# Patient Record
Sex: Female | Born: 1965 | Race: White | Hispanic: No | Marital: Single | Smoking: Former smoker
Health system: Southern US, Community
[De-identification: ages and names within clinical notes are randomized; demographics above are authoritative.]

## PROBLEM LIST (undated history)

## (undated) DIAGNOSIS — K589 Irritable bowel syndrome without diarrhea: Secondary | ICD-10-CM

## (undated) DIAGNOSIS — J40 Bronchitis, not specified as acute or chronic: Secondary | ICD-10-CM

## (undated) DIAGNOSIS — F32A Depression, unspecified: Secondary | ICD-10-CM

## (undated) DIAGNOSIS — Z9884 Bariatric surgery status: Secondary | ICD-10-CM

## (undated) DIAGNOSIS — K219 Gastro-esophageal reflux disease without esophagitis: Secondary | ICD-10-CM

## (undated) DIAGNOSIS — J449 Chronic obstructive pulmonary disease, unspecified: Secondary | ICD-10-CM

## (undated) DIAGNOSIS — F329 Major depressive disorder, single episode, unspecified: Secondary | ICD-10-CM

## (undated) DIAGNOSIS — C3490 Malignant neoplasm of unspecified part of unspecified bronchus or lung: Secondary | ICD-10-CM

## (undated) DIAGNOSIS — F419 Anxiety disorder, unspecified: Secondary | ICD-10-CM

## (undated) HISTORY — DX: Major depressive disorder, single episode, unspecified: F32.9

## (undated) HISTORY — DX: Irritable bowel syndrome, unspecified: K58.9

## (undated) HISTORY — PX: CHOLECYSTECTOMY: SHX55

## (undated) HISTORY — DX: Anxiety disorder, unspecified: F41.9

## (undated) HISTORY — PX: BACK SURGERY: SHX140

## (undated) HISTORY — DX: Bariatric surgery status: Z98.84

## (undated) HISTORY — DX: Chronic obstructive pulmonary disease, unspecified: J44.9

## (undated) HISTORY — PX: COLON SURGERY: SHX602

## (undated) HISTORY — DX: Depression, unspecified: F32.A

## (undated) HISTORY — DX: Gastro-esophageal reflux disease without esophagitis: K21.9

## (undated) HISTORY — DX: Bronchitis, not specified as acute or chronic: J40

---

## 1998-03-22 HISTORY — PX: OTHER SURGICAL HISTORY: SHX169

## 2013-09-06 ENCOUNTER — Encounter (INDEPENDENT_AMBULATORY_CARE_PROVIDER_SITE_OTHER): Payer: Self-pay | Admitting: *Deleted

## 2013-10-25 ENCOUNTER — Ambulatory Visit (INDEPENDENT_AMBULATORY_CARE_PROVIDER_SITE_OTHER): Payer: BC Managed Care – PPO | Admitting: Internal Medicine

## 2013-10-25 ENCOUNTER — Encounter (INDEPENDENT_AMBULATORY_CARE_PROVIDER_SITE_OTHER): Payer: Self-pay | Admitting: Internal Medicine

## 2013-10-25 VITALS — BP 92/56 | HR 68 | Temp 98.2°F | Ht 69.0 in | Wt 176.2 lb

## 2013-10-25 DIAGNOSIS — K589 Irritable bowel syndrome without diarrhea: Secondary | ICD-10-CM | POA: Insufficient documentation

## 2013-10-25 DIAGNOSIS — R1013 Epigastric pain: Secondary | ICD-10-CM

## 2013-10-25 DIAGNOSIS — R197 Diarrhea, unspecified: Secondary | ICD-10-CM | POA: Insufficient documentation

## 2013-10-25 DIAGNOSIS — G8929 Other chronic pain: Secondary | ICD-10-CM

## 2013-10-25 LAB — CBC WITH DIFFERENTIAL/PLATELET
BASOS ABS: 0.1 10*3/uL (ref 0.0–0.1)
Basophils Relative: 1 % (ref 0–1)
EOS PCT: 4 % (ref 0–5)
Eosinophils Absolute: 0.4 10*3/uL (ref 0.0–0.7)
HEMATOCRIT: 36.3 % (ref 36.0–46.0)
Hemoglobin: 12.1 g/dL (ref 12.0–15.0)
LYMPHS ABS: 3.6 10*3/uL (ref 0.7–4.0)
Lymphocytes Relative: 41 % (ref 12–46)
MCH: 28.4 pg (ref 26.0–34.0)
MCHC: 33.3 g/dL (ref 30.0–36.0)
MCV: 85.2 fL (ref 78.0–100.0)
Monocytes Absolute: 0.7 10*3/uL (ref 0.1–1.0)
Monocytes Relative: 8 % (ref 3–12)
NEUTROS ABS: 4 10*3/uL (ref 1.7–7.7)
Neutrophils Relative %: 46 % (ref 43–77)
Platelets: 361 10*3/uL (ref 150–400)
RBC: 4.26 MIL/uL (ref 3.87–5.11)
RDW: 13.7 % (ref 11.5–15.5)
WBC: 8.8 10*3/uL (ref 4.0–10.5)

## 2013-10-25 MED ORDER — DICYCLOMINE HCL 10 MG PO CAPS: 10.0000 mg | ORAL_CAPSULE | Freq: Two times a day (BID) | ORAL | Status: DC

## 2013-10-25 NOTE — Patient Instructions (Signed)
EGD/Colonoscopy. The risks and benefits such as perforation, bleeding, and infection were reviewed with the patient and is agreeable. 

## 2013-10-25 NOTE — Progress Notes (Addendum)
Subjective:     Patient ID: Yvonne Lang, female   DOB: Jul 22, 1965, 48 y.o.   MRN: [id number]  HPI Referred to our office for frequent diarrhea.  She tells me after she eats she will have epigastric pain. She says the pain will causes her to double over.  If she takes a large swallow of an antacid, pain will be relieved. She also uses a heating pad for pain relief.  The pain usually last for about an hour or less.  Certain medicines will upset her stomach such as pain medication. She says Nexium controls her acid reflux.  She has diarrhea in the am. She will have a semi-solid stool and then after she drinks coffee she will have a loose stool.   Symptoms for a year.  No recent antibiotics.  . No melena or bright red rectal bleeding. Appetite is good. No unintentional weight loss.  No antibiotics.  Colonoscopy years ago which was normal. Hx of gastric bypass for obesity years ago in [place].  Review of Systems     Past Medical History  Diagnosis Date  . H/O gastric bypass     1999    Past Surgical History  Procedure Laterality Date  . Colon surgery      for a kink in her colon and 12 inchs removed  . Back surgery    . Cholecystectomy      Allergies no known allergies  No current outpatient prescriptions on file prior to visit.   No current facility-administered medications on file prior to visit.     Objective:   Physical Exam  Filed Vitals:   10/25/13 1529  BP: 92/56  Pulse: 68  Temp: 98.2 F (36.8 C)  Height: 5\' 9"  (1.753 m)  Weight: 176 lb 3.2 oz (79.924 kg)  Alert and oriented. Skin warm and dry. Oral mucosa is moist.   . Sclera anicteric, conjunctivae is pink. Thyroid not enlarged. No cervical lymphadenopathy. Lungs clear. Heart regular rate and rhythm.  Abdomen is soft. Bowel sounds are positive. No hepatomegaly. No abdominal masses felt. No tenderness.  No edema to lower extremities.  No stool, guaiac negative.        Assessment:     Change in her stool x 1  year. Suspect she may have IBS. No recent antibiotics. No weight loss. Colonic neoplasm in the differential.  Sporadic epigastric pain. PUD needs to be ruled out.      Plan:     EGD/Colonoscopy. The risks and benefits such as perforation, bleeding, and infection were reviewed with the patient and is agreeable. Dicyclomine 10mg  BID. Continue the Nexium.  CBC and CMet.

## 2013-10-26 LAB — COMPREHENSIVE METABOLIC PANEL
ALT: 20 U/L (ref 0–35)
AST: 18 U/L (ref 0–37)
Albumin: 4.1 g/dL (ref 3.5–5.2)
Alkaline Phosphatase: 82 U/L (ref 39–117)
BUN: 4 mg/dL — ABNORMAL LOW (ref 6–23)
CALCIUM: 9 mg/dL (ref 8.4–10.5)
CO2: 28 meq/L (ref 19–32)
CREATININE: 0.59 mg/dL (ref 0.50–1.10)
Chloride: 107 mEq/L (ref 96–112)
Glucose, Bld: 120 mg/dL — ABNORMAL HIGH (ref 70–99)
POTASSIUM: 4 meq/L (ref 3.5–5.3)
Sodium: 144 mEq/L (ref 135–145)
Total Bilirubin: 0.2 mg/dL (ref 0.2–1.2)
Total Protein: 6.1 g/dL (ref 6.0–8.3)

## 2013-10-29 ENCOUNTER — Telehealth (INDEPENDENT_AMBULATORY_CARE_PROVIDER_SITE_OTHER): Payer: Self-pay | Admitting: *Deleted

## 2013-10-29 ENCOUNTER — Other Ambulatory Visit (INDEPENDENT_AMBULATORY_CARE_PROVIDER_SITE_OTHER): Payer: Self-pay | Admitting: *Deleted

## 2013-10-29 DIAGNOSIS — R1013 Epigastric pain: Secondary | ICD-10-CM

## 2013-10-29 DIAGNOSIS — Z1211 Encounter for screening for malignant neoplasm of colon: Secondary | ICD-10-CM

## 2013-10-29 DIAGNOSIS — G8929 Other chronic pain: Secondary | ICD-10-CM

## 2013-10-29 DIAGNOSIS — R195 Other fecal abnormalities: Secondary | ICD-10-CM

## 2013-10-29 NOTE — Telephone Encounter (Signed)
Patient needs movi prep 

## 2013-10-31 MED ORDER — PEG-KCL-NACL-NASULF-NA ASC-C 100 G PO SOLR: 1.0000 | Freq: Once | ORAL | Status: DC

## 2013-12-11 ENCOUNTER — Encounter (HOSPITAL_COMMUNITY): Payer: Self-pay | Admitting: Pharmacy Technician

## 2013-12-13 ENCOUNTER — Ambulatory Visit (HOSPITAL_COMMUNITY)
Admission: RE | Admit: 2013-12-13 | Discharge: 2013-12-13 | Disposition: A | Discharge status: Home / Self Care | Payer: BC Managed Care – PPO | Source: Ambulatory Visit | Attending: Internal Medicine | Admitting: Internal Medicine

## 2013-12-13 ENCOUNTER — Encounter (HOSPITAL_COMMUNITY): Payer: Self-pay | Admitting: *Deleted

## 2013-12-13 ENCOUNTER — Encounter (HOSPITAL_COMMUNITY)
Admission: RE | Disposition: A | Discharge status: Home / Self Care | Payer: Self-pay | Source: Ambulatory Visit | Attending: Internal Medicine

## 2013-12-13 DIAGNOSIS — D126 Benign neoplasm of colon, unspecified: Secondary | ICD-10-CM | POA: Insufficient documentation

## 2013-12-13 DIAGNOSIS — R195 Other fecal abnormalities: Secondary | ICD-10-CM

## 2013-12-13 DIAGNOSIS — Q438 Other specified congenital malformations of intestine: Secondary | ICD-10-CM

## 2013-12-13 DIAGNOSIS — R1013 Epigastric pain: Secondary | ICD-10-CM | POA: Insufficient documentation

## 2013-12-13 DIAGNOSIS — Z79899 Other long term (current) drug therapy: Secondary | ICD-10-CM | POA: Diagnosis not present

## 2013-12-13 DIAGNOSIS — G8929 Other chronic pain: Secondary | ICD-10-CM

## 2013-12-13 DIAGNOSIS — R197 Diarrhea, unspecified: Secondary | ICD-10-CM | POA: Insufficient documentation

## 2013-12-13 DIAGNOSIS — Z7982 Long term (current) use of aspirin: Secondary | ICD-10-CM | POA: Insufficient documentation

## 2013-12-13 DIAGNOSIS — Z9884 Bariatric surgery status: Secondary | ICD-10-CM | POA: Insufficient documentation

## 2013-12-13 HISTORY — PX: COLONOSCOPY: SHX5424

## 2013-12-13 HISTORY — PX: ESOPHAGOGASTRODUODENOSCOPY: SHX5428

## 2013-12-13 SURGERY — COLONOSCOPY
Anesthesia: Moderate Sedation

## 2013-12-13 MED ORDER — MEPERIDINE HCL 50 MG/ML IJ SOLN
INTRAMUSCULAR | Status: DC | PRN
  Administered 2013-12-13 (×4): 25 mg via INTRAVENOUS

## 2013-12-13 MED ORDER — MEPERIDINE HCL 50 MG/ML IJ SOLN
INTRAMUSCULAR | Status: AC
  Filled 2013-12-13: qty 1

## 2013-12-13 MED ORDER — SODIUM CHLORIDE 0.9 % IV SOLN
INTRAVENOUS | Status: DC
  Administered 2013-12-13: 1000 mL via INTRAVENOUS

## 2013-12-13 MED ORDER — ONDANSETRON HCL 4 MG/2ML IJ SOLN
4.0000 mg | Freq: Once | INTRAMUSCULAR | Status: AC
  Administered 2013-12-13: 4 mg via INTRAVENOUS
  Filled 2013-12-13: qty 2

## 2013-12-13 MED ORDER — SIMETHICONE 40 MG/0.6ML PO SUSP
ORAL | Status: DC | PRN
  Administered 2013-12-13: 14:00:00

## 2013-12-13 MED ORDER — MIDAZOLAM HCL 5 MG/5ML IJ SOLN
INTRAMUSCULAR | Status: AC
  Filled 2013-12-13: qty 10

## 2013-12-13 MED ORDER — DICYCLOMINE HCL 10 MG PO CAPS: 10.0000 mg | ORAL_CAPSULE | Freq: Three times a day (TID) | ORAL | Status: DC

## 2013-12-13 MED ORDER — BUTAMBEN-TETRACAINE-BENZOCAINE 2-2-14 % EX AERO
INHALATION_SPRAY | CUTANEOUS | Status: DC | PRN
  Administered 2013-12-13: 2 via TOPICAL

## 2013-12-13 MED ORDER — MIDAZOLAM HCL 5 MG/5ML IJ SOLN
INTRAMUSCULAR | Status: DC | PRN
  Administered 2013-12-13: 2 mg via INTRAVENOUS
  Administered 2013-12-13: 3 mg via INTRAVENOUS
  Administered 2013-12-13 (×3): 2 mg via INTRAVENOUS
  Administered 2013-12-13: 3 mg via INTRAVENOUS
  Administered 2013-12-13 (×3): 2 mg via INTRAVENOUS

## 2013-12-13 MED ORDER — ONDANSETRON HCL 4 MG/2ML IJ SOLN
INTRAMUSCULAR | Status: AC
  Administered 2013-12-13: 4 mg via INTRAVENOUS
  Filled 2013-12-13: qty 2

## 2013-12-13 NOTE — Discharge Instructions (Signed)
Resume usual medications. Six small meals daily. Increase dicyclomine 10 mg by mouth 4 times a day. No driving for 24 hours. Physician will call with biopsy results   Colonoscopy, Care After Refer to this sheet in the next few weeks. These instructions provide you with information on caring for yourself after your procedure. Your health care provider may also give you more specific instructions. Your treatment has been planned according to current medical practices, but problems sometimes occur. Call your health care provider if you have any problems or questions after your procedure. WHAT TO EXPECT AFTER THE PROCEDURE  After your procedure, it is typical to have the following:  A small amount of blood in your stool.  Moderate amounts of gas and mild abdominal cramping or bloating. HOME CARE INSTRUCTIONS  Do not drive, operate machinery, or sign important documents for 24 hours.  You may shower and resume your regular physical activities, but move at a slower pace for the first 24 hours.  Take frequent rest periods for the first 24 hours.  Walk around or put a warm pack on your abdomen to help reduce abdominal cramping and bloating.  Drink enough fluids to keep your urine clear or pale yellow.  You may resume your normal diet as instructed by your health care provider. Avoid heavy or fried foods that are hard to digest.  Avoid drinking alcohol for 24 hours or as instructed by your health care provider.  Only take over-the-counter or prescription medicines as directed by your health care provider.  If a tissue sample (biopsy) was taken during your procedure:  Do not take aspirin or blood thinners for 7 days, or as instructed by your health care provider.  Do not drink alcohol for 7 days, or as instructed by your health care provider.  Eat soft foods for the first 24 hours. SEEK MEDICAL CARE IF: You have persistent spotting of blood in your stool 2-3 days after the  procedure. SEEK IMMEDIATE MEDICAL CARE IF:  You have more than a small spotting of blood in your stool.  You pass large blood clots in your stool.  Your abdomen is swollen (distended).  You have nausea or vomiting.  You have a fever.  You have increasing abdominal pain that is not relieved with medicine. Document Released: 10/21/2003 Document Revised: 12/27/2012 Document Reviewed: 11/13/2012 [facility] Patient Information 2015 [place], [place]. This information is not intended to replace advice given to you by your health care provider. Make sure you discuss any questions you have with your health care provider.

## 2013-12-13 NOTE — Op Note (Signed)
EGD PROCEDURE REPORT  PATIENT:  Yvonne Lang  MR#:  [id number] Birthdate:  07-24-1965, 48 y.o., female Endoscopist:  Dr. Rogene Houston, MD Referred By:  Dr. Sherrilee Gilles. Gerarda Fraction, M.D.  Procedure Date: 12/13/2013  Procedure:   EGD & Colonoscopy  Indications: Patient is a 48 year old Caucasian female who presents with intermittent postprandial epigastric pain of 6 months duration she also complains of chronic diarrhea. Past history significant for Roux-en-Y gastric surgery for weight and she also had a laparotomy with resection of ? Small bowel.            Informed Consent:  The risks, benefits, alternatives & imponderables which include, but are not limited to, bleeding, infection, perforation, drug reaction and potential missed lesion have been reviewed.  The potential for biopsy, lesion removal, esophageal dilation, etc. have also been discussed.  Questions have been answered.  All parties agreeable.  Please see history & physical in medical record for more information.  Medications:  Demerol 100 mg IV Versed 20 mg IV Zofran 4 mg IV x 1 Cetacaine spray topically for oropharyngeal anesthesia  EGD  Description of procedure:  The endoscope was introduced through the mouth and advanced to the second portion of the duodenum without difficulty or limitations. The mucosal surfaces were surveyed very carefully during advancement of the scope and upon withdrawal.  Findings:  Esophagus:  The cause of the esophagus was normal in GE junction was unremarkable. GEJ:  41 cm Stomach:  Very small gastric pouch with wide-open gastrojejunostomy without ulceration. Jejunum:  Jejunal mucosa was examined for up to 30 cm and reveals no abnormality.  Therapeutic/Diagnostic Maneuvers Performed:  None  COLONOSCOPY Description of procedure:  After a digital rectal exam was performed, that colonoscope was advanced from the anus through the rectum and colon to the area of the cecum, ileocecal valve and  appendiceal orifice. The cecum was deeply intubated. These structures were well-seen and photographed for the record. From the level of the cecum and ileocecal valve, the scope was slowly and cautiously withdrawn. The mucosal surfaces were carefully surveyed utilizing scope tip to flexion to facilitate fold flattening as needed. The scope was pulled down into the rectum where a thorough exam including retroflexion was performed.  Findings:   Prep excellent. Two small polyps cold snared and submitted together. These are located in the ascending colon and hepatic flexure. Mucosa of rest of the colon and rectum was normal. Unremarkable anorectal junction.  Therapeutic/Diagnostic Maneuvers Performed:  See above  Complications:  None  Cecal Withdrawal Time:  17  minutes  Impression:   EGD findings; No evidence of erosive esophagitis. Small gastric pouch with patent gastrojejunostomy without ulceration. Normal mucosa of proximal jejunum.  Colonoscopy findings. Redundant colon. Two small polyps were cold snared and submitted together(ascending colon and hepatic flexure).  Recommendations:  Patient advised to eat six small meals daily. Increase dicyclomine 10 mg 4 times a day. I will be contacting patient biopsy results. Will consider abdominopelvic CT if abdominal pain persists.  [name],[name]  12/13/2013 3:33 PM  CC: Dr. Glo Herring., MD & Dr. Rayne Du ref. provider found

## 2013-12-13 NOTE — H&P (Signed)
Yvonne Lang is an 48 y.o. female.   Chief Complaint: Patient is here for EGD and colonoscopy. HPI: Patient is a 48 year old Caucasian female presents with chronic diarrhea. She does not recall the last time she had formed stool. Most of her stools or use that every now and then she may have a soft stool. She has an average of 2 stools per day. She denies melena or rectal bleeding. She also complains of postprandial epigastric pain that she's had intermittently for the last 6 months. She is on Nexium with good control of heartburn. She denies nausea vomiting anorexia or weight loss. She had a Roux-en-Y gastric bypass surgery about 16 years ago and lost about 130 pounds. She does not NSAIDs. She does not drink alcohol but she smokes a pack of cigarettes per day.  Past Medical History  Diagnosis Date  . H/O gastric bypass     1999    Past Surgical History  Procedure Laterality Date  . Colon surgery      for a kink in her colon and 12 inchs removed  . Back surgery    . Cholecystectomy      History reviewed. No pertinent family history. Social History:  reports that she has been smoking.  She does not have any smokeless tobacco history on file. She reports that she does not drink alcohol or use illicit drugs.  Allergies: No Known Allergies  Medications Prior to Admission  Medication Sig Dispense Refill  . aspirin 81 MG tablet Take 81 mg by mouth daily.      . cyclobenzaprine (FLEXERIL) 10 MG tablet Take 10 mg by mouth 3 (three) times daily as needed for muscle spasms.      Marland Kitchen dicyclomine (BENTYL) 10 MG capsule Take 10 mg by mouth 2 (two) times daily before a meal.      . esomeprazole (NEXIUM) 20 MG capsule Take 20 mg by mouth daily.      Marland Kitchen FLUoxetine (PROZAC) 20 MG capsule Take 20 mg by mouth daily.      Marland Kitchen gabapentin (NEURONTIN) 600 MG tablet Take 600 mg by mouth 4 (four) times daily.       Marland Kitchen LORazepam (ATIVAN) 1 MG tablet Take 1 mg by mouth daily.      . Multiple Vitamin  (MULTIVITAMIN) tablet Take 1 tablet by mouth daily.      . Omega-3 Fatty Acids (FISH OIL) 600 MG CAPS Take 600 mg by mouth daily.       Marland Kitchen omeprazole (PRILOSEC) 20 MG capsule Take 20 mg by mouth daily.      . peg 3350 powder (MOVIPREP) 100 G SOLR Take 1 kit (200 g total) by mouth once.  1 kit  0  . traMADol (ULTRAM) 50 MG tablet Take by mouth every 6 (six) hours as needed.        No results found for this or any previous visit (from the past 48 hour(s)). No results found.  ROS  Blood pressure 94/68, pulse 70, temperature 98 F (36.7 C), temperature source Oral, resp. rate 18, height '5\' 9"'  (1.753 m), weight 179 lb (81.194 kg), SpO2 97.00%. Physical Exam  Constitutional: She appears well-developed and well-nourished.  HENT:  Mouth/Throat: Oropharynx is clear and moist.  Eyes: Conjunctivae are normal. No scleral icterus.  Neck: No thyromegaly present.  Cardiovascular: Normal rate, regular rhythm and normal heart sounds.   No murmur heard. Respiratory: Effort normal and breath sounds normal.  GI: Soft. She exhibits no distension and no mass.  Tenderness: mild midepigastric tenderness. There is no guarding.  Musculoskeletal: She exhibits no edema.  Lymphadenopathy:    She has no cervical adenopathy.  Neurological: She is alert.  Skin: Skin is warm and dry.     Assessment/Plan Recurrent epigastric pain. History of Roux-en-Y gastric bypass surgery. Chronic diarrhea. Diagnostic EGD and colonoscopy.  [name],[name] 12/13/2013, 2:11 PM

## 2013-12-14 ENCOUNTER — Encounter (HOSPITAL_COMMUNITY): Payer: Self-pay | Admitting: Internal Medicine

## 2014-01-15 ENCOUNTER — Encounter (INDEPENDENT_AMBULATORY_CARE_PROVIDER_SITE_OTHER): Payer: Self-pay | Admitting: *Deleted

## 2014-02-04 ENCOUNTER — Telehealth (INDEPENDENT_AMBULATORY_CARE_PROVIDER_SITE_OTHER): Payer: Self-pay | Admitting: *Deleted

## 2014-02-04 ENCOUNTER — Other Ambulatory Visit (INDEPENDENT_AMBULATORY_CARE_PROVIDER_SITE_OTHER): Payer: Self-pay | Admitting: *Deleted

## 2014-02-04 DIAGNOSIS — K589 Irritable bowel syndrome without diarrhea: Secondary | ICD-10-CM

## 2014-02-04 DIAGNOSIS — R197 Diarrhea, unspecified: Secondary | ICD-10-CM

## 2014-02-04 MED ORDER — DICYCLOMINE HCL 10 MG PO CAPS: 10.0000 mg | ORAL_CAPSULE | Freq: Three times a day (TID) | ORAL | Status: DC

## 2014-02-04 NOTE — Telephone Encounter (Signed)
A new prescription has been faxed to the patient's pharmacy per NUR.

## 2014-02-04 NOTE — Telephone Encounter (Signed)
Yvonne Lang said Dr. Laural Golden upped her Dicyclomine from 2 times daily to 4 times daily and she is needing a new Rx sent to the pharmacy. She is out. The return phone number is any questions is [phone].

## 2014-02-04 NOTE — Telephone Encounter (Signed)
per orders Dr.[name] the patient by have the new prescription.

## 2014-02-08 ENCOUNTER — Telehealth (INDEPENDENT_AMBULATORY_CARE_PROVIDER_SITE_OTHER): Payer: Self-pay | Admitting: *Deleted

## 2014-02-08 NOTE — Telephone Encounter (Signed)
No answer at home. Unable to leave a message.

## 2014-02-08 NOTE — Telephone Encounter (Signed)
Dr. Laural Golden will you please see if you can help this patient.

## 2014-02-08 NOTE — Telephone Encounter (Signed)
Yvonne Lang would like to speak with someone about the medicine she is on. She is getting an upset stomach after every meal. Please return her call at [phone].

## 2014-02-12 NOTE — Telephone Encounter (Signed)
Unable to leave message

## 2014-02-19 NOTE — Telephone Encounter (Signed)
I have tried calling this patient and she will not respond

## 2014-02-21 NOTE — Telephone Encounter (Signed)
Talked with patient; She will need AP CT with contrast. Patient will call Lelon Frohlich tomorrow and let her when she can go for the test.

## 2014-02-22 ENCOUNTER — Other Ambulatory Visit (INDEPENDENT_AMBULATORY_CARE_PROVIDER_SITE_OTHER): Payer: Self-pay | Admitting: Internal Medicine

## 2014-02-22 DIAGNOSIS — R1013 Epigastric pain: Principal | ICD-10-CM

## 2014-02-22 DIAGNOSIS — R197 Diarrhea, unspecified: Secondary | ICD-10-CM

## 2014-02-22 DIAGNOSIS — G8929 Other chronic pain: Secondary | ICD-10-CM

## 2014-02-22 NOTE — Telephone Encounter (Signed)
Spoke to patient and she states she dosen't want CT at this time due to financial issue -- wants to know if there is medicine she can try

## 2014-02-27 ENCOUNTER — Other Ambulatory Visit (HOSPITAL_COMMUNITY): Payer: BC Managed Care – PPO

## 2014-03-01 ENCOUNTER — Ambulatory Visit (HOSPITAL_COMMUNITY)
Admission: RE | Admit: 2014-03-01 | Discharge: 2014-03-01 | Disposition: A | Discharge status: Home / Self Care | Payer: BC Managed Care – PPO | Source: Ambulatory Visit | Attending: Internal Medicine | Admitting: Internal Medicine

## 2014-03-01 ENCOUNTER — Ambulatory Visit (HOSPITAL_COMMUNITY): Payer: BC Managed Care – PPO

## 2014-03-01 DIAGNOSIS — Z9884 Bariatric surgery status: Secondary | ICD-10-CM | POA: Diagnosis not present

## 2014-03-01 DIAGNOSIS — R197 Diarrhea, unspecified: Secondary | ICD-10-CM | POA: Insufficient documentation

## 2014-03-01 DIAGNOSIS — Z9049 Acquired absence of other specified parts of digestive tract: Secondary | ICD-10-CM | POA: Diagnosis not present

## 2014-03-01 DIAGNOSIS — R1013 Epigastric pain: Secondary | ICD-10-CM | POA: Insufficient documentation

## 2014-03-01 DIAGNOSIS — G8929 Other chronic pain: Secondary | ICD-10-CM

## 2014-03-01 MED ORDER — IOHEXOL 300 MG/ML  SOLN
100.0000 mL | Freq: Once | INTRAMUSCULAR | Status: AC | PRN
  Administered 2014-03-01: 100 mL via INTRAVENOUS

## 2014-03-01 NOTE — Telephone Encounter (Signed)
She is having abdominopelvic CT today

## 2014-03-07 NOTE — Progress Notes (Signed)
Apt has been scheduled for 03/11/14 at 930 with Dr. Laural Golden.

## 2014-03-11 ENCOUNTER — Ambulatory Visit (INDEPENDENT_AMBULATORY_CARE_PROVIDER_SITE_OTHER): Payer: BC Managed Care – PPO | Admitting: Internal Medicine

## 2014-07-04 ENCOUNTER — Telehealth (INDEPENDENT_AMBULATORY_CARE_PROVIDER_SITE_OTHER): Payer: Self-pay | Admitting: *Deleted

## 2014-07-04 NOTE — Telephone Encounter (Signed)
Needs BENTYL refilled. The return phone number is [phone].

## 2014-07-05 NOTE — Telephone Encounter (Signed)
Pharmacy needs to send a request 

## 2014-07-05 NOTE — Telephone Encounter (Signed)
Patient advised of what Terri said and voices understood.

## 2014-11-14 ENCOUNTER — Ambulatory Visit (INDEPENDENT_AMBULATORY_CARE_PROVIDER_SITE_OTHER): Payer: Self-pay | Admitting: Internal Medicine

## 2015-07-18 ENCOUNTER — Emergency Department (HOSPITAL_BASED_OUTPATIENT_CLINIC_OR_DEPARTMENT_OTHER)
Admission: EM | Admit: 2015-07-18 | Discharge: 2015-07-18 | Disposition: A | Discharge status: Home / Self Care | Payer: BLUE CROSS/BLUE SHIELD | Attending: Emergency Medicine | Admitting: Emergency Medicine

## 2015-07-18 ENCOUNTER — Encounter (HOSPITAL_BASED_OUTPATIENT_CLINIC_OR_DEPARTMENT_OTHER): Payer: Self-pay | Admitting: *Deleted

## 2015-07-18 DIAGNOSIS — L089 Local infection of the skin and subcutaneous tissue, unspecified: Secondary | ICD-10-CM

## 2015-07-18 DIAGNOSIS — Z79899 Other long term (current) drug therapy: Secondary | ICD-10-CM | POA: Insufficient documentation

## 2015-07-18 DIAGNOSIS — L723 Sebaceous cyst: Secondary | ICD-10-CM | POA: Insufficient documentation

## 2015-07-18 DIAGNOSIS — F17200 Nicotine dependence, unspecified, uncomplicated: Secondary | ICD-10-CM | POA: Insufficient documentation

## 2015-07-18 MED ORDER — LIDOCAINE-EPINEPHRINE 2 %-1:100000 IJ SOLN: 20.0000 mL | Freq: Once | INTRAMUSCULAR | Status: DC

## 2015-07-18 MED ORDER — LIDOCAINE-EPINEPHRINE 1 %-1:100000 IJ SOLN
INTRAMUSCULAR | Status: AC
  Administered 2015-07-18: 1 mL
  Filled 2015-07-18: qty 1

## 2015-07-18 MED ORDER — SULFAMETHOXAZOLE-TRIMETHOPRIM 800-160 MG PO TABS: 1.0000 | ORAL_TABLET | Freq: Two times a day (BID) | ORAL | Status: AC

## 2015-07-18 NOTE — Discharge Instructions (Signed)
Please read and follow all provided instructions.  Your diagnoses today include:  1. Infected sebaceous cyst    Tests performed today include:  Vital signs. See below for your results today.   Medications prescribed:   Bactrim (trimethoprim/sulfamethoxazole) - antibiotic  You have been prescribed an antibiotic medicine: take the entire course of medicine even if you are feeling better. Stopping early can cause the antibiotic not to work.  Take any prescribed medications only as directed.   Home care instructions:   Follow any educational materials contained in this packet  Follow-up instructions: Return to the Emergency Department in 48 hours for a recheck if your symptoms are not significantly improved.  Please follow-up with your primary care provider in the next 1 week for further evaluation of your symptoms.   Return instructions:  Return to the Emergency Department if you have:  Fever  Worsening symptoms  Worsening pain  Worsening swelling  Redness of the skin that moves away from the affected area, especially if it streaks away from the affected area   Any other emergent concerns  Your vital signs today were: BP 113/89 mmHg   Pulse 92   Temp(Src) 98.8 F (37.1 C) (Oral)   Resp 18   Ht '5\' 9"'$  (1.753 m)   Wt 86.183 kg   BMI 28.05 kg/m2   SpO2 98% If your blood pressure (BP) was elevated above 135/85 this visit, please have this repeated by your doctor within one month. --------------

## 2015-07-18 NOTE — ED Provider Notes (Signed)
CSN: [id number]     Arrival date & time 07/18/15  1731 History   First MD Initiated Contact with Patient 07/18/15 1852     Chief Complaint  Patient presents with  . Abscess     (Consider location/radiation/quality/duration/timing/severity/associated sxs/prior Treatment) HPI Comments: Patient presents with acute onset of pain and swelling to the back of her neck for 3 days. Area has gradually been getting more painful and more swollen. No drainage. Patient had a cyst in the area prior. No fever, nausea or vomiting. No treatments prior to arrival. Palpation makes the pain worse. Nothing makes it better.  The history is provided by the patient.    Past Medical History  Diagnosis Date  . H/O gastric bypass     1999   Past Surgical History  Procedure Laterality Date  . Colon surgery      for a kink in her colon and 12 inchs removed  . Back surgery    . Cholecystectomy    . Colonoscopy N/A 12/13/2013    Procedure: COLONOSCOPY;  Surgeon: Rogene Houston, MD;  Location: [facility];  Service: Endoscopy;  Laterality: N/A;  200  . Esophagogastroduodenoscopy N/A 12/13/2013    Procedure: ESOPHAGOGASTRODUODENOSCOPY (EGD);  Surgeon: Rogene Houston, MD;  Location: [facility];  Service: Endoscopy;  Laterality: N/A;   History reviewed. No pertinent family history. Social History  Substance Use Topics  . Smoking status: Current Every Day Smoker  . Smokeless tobacco: None     Comment: pack a day 30 yrs  . Alcohol Use: No   OB History    No data available     Review of Systems  Constitutional: Negative for fever.  Gastrointestinal: Negative for nausea and vomiting.  Skin: Negative for color change.       Positive for abscess.  Hematological: Negative for adenopathy.      Allergies  Review of patient's allergies indicates no known allergies.  Home Medications   Prior to Admission medications   Medication Sig Start Date End Date Taking? Authorizing Provider  dicyclomine  (BENTYL) 20 MG tablet Take 20 mg by mouth every 6 (six) hours.   Yes Historical Provider, MD  DULoxetine (CYMBALTA) 60 MG capsule Take 60 mg by mouth daily.   Yes Historical Provider, MD  aspirin 81 MG tablet Take 81 mg by mouth daily.    Historical Provider, MD  cyclobenzaprine (FLEXERIL) 10 MG tablet Take 10 mg by mouth 3 (three) times daily as needed for muscle spasms.    Historical Provider, MD  esomeprazole (NEXIUM) 20 MG capsule Take 20 mg by mouth daily.    Historical Provider, MD  FLUoxetine (PROZAC) 20 MG capsule Take 20 mg by mouth daily.    Historical Provider, MD  gabapentin (NEURONTIN) 600 MG tablet Take 600 mg by mouth 4 (four) times daily.     Historical Provider, MD  LORazepam (ATIVAN) 1 MG tablet Take 1 mg by mouth daily.    Historical Provider, MD  Multiple Vitamin (MULTIVITAMIN) tablet Take 1 tablet by mouth daily.    Historical Provider, MD  Omega-3 Fatty Acids (FISH OIL) 600 MG CAPS Take 600 mg by mouth daily.     Historical Provider, MD  omeprazole (PRILOSEC) 20 MG capsule Take 20 mg by mouth daily.    Historical Provider, MD  traMADol (ULTRAM) 50 MG tablet Take by mouth every 6 (six) hours as needed.    Historical Provider, MD   BP 113/89 mmHg  Pulse 92  Temp(Src)  98.8 F (37.1 C) (Oral)  Resp 18  Ht '5\' 9"'$  (1.753 m)  Wt 86.183 kg  BMI 28.05 kg/m2  SpO2 98%   Physical Exam  Constitutional: She appears well-developed and well-nourished.  HENT:  Head: Normocephalic and atraumatic.  Eyes: Conjunctivae are normal.  Neck: Normal range of motion. Neck supple.  Pulmonary/Chest: No respiratory distress.  Neurological: She is alert.  Skin: Skin is warm and dry.  Patient with 3-4 cm area of induration with 1 cm overlying erythema, with several centimeters of surrounding light erythema to the upper back consistent with infected sebaceous cyst with mild surrounding cellulitis.   Psychiatric: She has a normal mood and affect.  Nursing note and vitals reviewed.   ED  Course  Procedures (including critical care time)  7:29 PM Patient seen and examined. Work-up initiated. Medications ordered.   Vital signs reviewed and are as follows: BP 113/89 mmHg  Pulse 92  Temp(Src) 98.8 F (37.1 C) (Oral)  Resp 18  Ht '5\' 9"'$  (1.753 m)  Wt 86.183 kg  BMI 28.05 kg/m2  SpO2 98%  INCISION AND DRAINAGE Performed by: Arletta Bale PA-S2 Consent: Verbal consent obtained. Risks and benefits: risks, benefits and alternatives were discussed Type: abscess  Body area: posterior upper back/neck  Anesthesia: local infiltration  Incision was made with a scalpel.  Local anesthetic: lidocaine 1% with epinephrine  Anesthetic total: 3 ml  Complexity: complex Blunt dissection to break up loculations  Drainage: purulent  Drainage amount: large  Packing material: none  Patient tolerance: Patient tolerated the procedure well with no immediate complications.  7:57 PM The patient was urged to return to the Emergency Department urgently with worsening pain, swelling, expanding erythema especially if it streaks away from the affected area, fever, or if they have any other concerns.   The patient was instructed to remove packing at home in 48 hours and return to the Emergency Department or go to their PCP at that time for a recheck if their symptoms are not entirely resolved.  The patient verbalized understanding and stated agreement with this plan.      MDM   Final diagnoses:  Infected sebaceous cyst   Patient with infected sebaceous cyst amenable to incision and drainage. Abx given 2/2 associated cellulitis. Patient appears well.     Carlisle Cater, PA-C 07/18/15 1958  Julianne Rice, MD 07/21/15 [phone]

## 2015-07-18 NOTE — ED Notes (Signed)
Pt with very large abscess on the back of her neck.

## 2016-01-19 ENCOUNTER — Ambulatory Visit (INDEPENDENT_AMBULATORY_CARE_PROVIDER_SITE_OTHER): Payer: BLUE CROSS/BLUE SHIELD | Admitting: Internal Medicine

## 2016-03-26 ENCOUNTER — Ambulatory Visit (INDEPENDENT_AMBULATORY_CARE_PROVIDER_SITE_OTHER): Payer: BLUE CROSS/BLUE SHIELD | Admitting: Podiatry

## 2016-03-26 ENCOUNTER — Ambulatory Visit (INDEPENDENT_AMBULATORY_CARE_PROVIDER_SITE_OTHER): Payer: BLUE CROSS/BLUE SHIELD

## 2016-03-26 VITALS — BP 100/68 | HR 73 | Temp 97.6°F | Resp 16

## 2016-03-26 DIAGNOSIS — L603 Nail dystrophy: Secondary | ICD-10-CM | POA: Diagnosis not present

## 2016-03-26 DIAGNOSIS — M722 Plantar fascial fibromatosis: Secondary | ICD-10-CM

## 2016-03-26 DIAGNOSIS — L608 Other nail disorders: Secondary | ICD-10-CM | POA: Diagnosis not present

## 2016-03-26 DIAGNOSIS — B351 Tinea unguium: Secondary | ICD-10-CM | POA: Diagnosis not present

## 2016-03-26 DIAGNOSIS — M79671 Pain in right foot: Secondary | ICD-10-CM

## 2016-03-26 DIAGNOSIS — M79609 Pain in unspecified limb: Secondary | ICD-10-CM

## 2016-03-26 DIAGNOSIS — R52 Pain, unspecified: Secondary | ICD-10-CM

## 2016-03-26 MED ORDER — MELOXICAM 15 MG PO TABS: 15.0000 mg | ORAL_TABLET | Freq: Every day | ORAL | 1 refills | Status: AC

## 2016-03-26 NOTE — Progress Notes (Signed)
   Subjective:    Patient ID: Yvonne Lang, female    DOB: Mar 22, 1966, 51 y.o.   MRN: [id number]  HPI    Review of Systems  All other systems reviewed and are negative.      Objective:   Physical Exam        Assessment & Plan:

## 2016-03-28 MED ORDER — BETAMETHASONE SOD PHOS & ACET 6 (3-3) MG/ML IJ SUSP: 3.0000 mg | Freq: Once | INTRAMUSCULAR | Status: DC

## 2016-03-28 NOTE — Progress Notes (Signed)
Patient ID: Yvonne Lang, female   DOB: 09/02/1965, 51 y.o.   MRN: [id number] Subjective: Patient presents today for pain and tenderness in the right foot. Patient states the foot pain has been hurting for several weeks now. Patient states that it hurts in the mornings with the first steps out of bed. Patient also complains of dystrophic, tender toenails to digits 1-5 bilateral. Patient states that she's had several nail procedures performed in the past. Patient states she would like her toenails removed completely. Patient presents today for further treatment and evaluation  Objective: Physical Exam General: The patient is alert and oriented x3 in no acute distress.  Dermatology: Hyperkeratotic, onychodystrophy of nails noted 1-5 bilateral. Tender to palpation. Skin is warm, dry and supple bilateral lower extremities. Negative for open lesions or macerations bilateral.   Vascular: Dorsalis Pedis and Posterior Tibial pulses palpable bilateral.  Capillary fill time is immediate to all digits.  Neurological: Epicritic and protective threshold intact bilateral.   Musculoskeletal: Tenderness to palpation at the medial calcaneal tubercale and through the insertion of the plantar fascia of the right foot. All other joints range of motion within normal limits bilateral. Strength 5/5 in all groups bilateral.   Radiographic exam: Normal osseous mineralization. Joint spaces preserved. No fracture/dislocation/boney destruction. Calcaneal spur present with mild thickening of plantar fascia right. No other soft tissue abnormalities or radiopaque foreign bodies.   Assessment: 1. Plantar fasciitis right 2. Pain in right foot  Plan of Care:  1. Patient evaluated. Xrays reviewed.   2. Injection of 0.5cc Celestone soluspan injected into the right heel at the insertion of the plantar fascia.  3. Instructed patient regarding therapies and modalities at home to alleviate symptoms.  4. Rx for Meloxicam '15mg'$   PO dispensed. Discontinue Celebrex prescribed from her primary care physician   5. Plantar fascial band(s) dispensed. 6. After plantar fasciitis is resolved, we will address dystrophic nails 1-5 bilateral 7. Return to clinic in 4 weeks.     Edrick Kins, DPM [facility]  Dr. Edrick Kins, [address]                                        [place], [place] [address]                Office [phone]  Fax [phone]

## 2016-04-23 ENCOUNTER — Ambulatory Visit: Payer: BLUE CROSS/BLUE SHIELD | Admitting: Podiatry

## 2016-07-23 ENCOUNTER — Other Ambulatory Visit: Payer: Self-pay | Admitting: Physical Medicine and Rehabilitation

## 2016-07-23 DIAGNOSIS — M545 Low back pain: Principal | ICD-10-CM

## 2016-07-23 DIAGNOSIS — G8929 Other chronic pain: Secondary | ICD-10-CM

## 2017-01-31 ENCOUNTER — Other Ambulatory Visit (HOSPITAL_COMMUNITY): Payer: Self-pay | Admitting: Internal Medicine

## 2017-01-31 DIAGNOSIS — Z1231 Encounter for screening mammogram for malignant neoplasm of breast: Secondary | ICD-10-CM

## 2017-02-09 ENCOUNTER — Other Ambulatory Visit (HOSPITAL_COMMUNITY): Payer: Self-pay | Admitting: Internal Medicine

## 2017-02-09 ENCOUNTER — Ambulatory Visit (HOSPITAL_COMMUNITY)
Admission: RE | Admit: 2017-02-09 | Discharge: 2017-02-09 | Disposition: A | Discharge status: Home / Self Care | Payer: 59 | Source: Ambulatory Visit | Attending: Internal Medicine | Admitting: Internal Medicine

## 2017-02-09 DIAGNOSIS — R0600 Dyspnea, unspecified: Secondary | ICD-10-CM | POA: Insufficient documentation

## 2017-03-04 ENCOUNTER — Ambulatory Visit (HOSPITAL_COMMUNITY): Payer: BLUE CROSS/BLUE SHIELD

## 2017-03-17 ENCOUNTER — Other Ambulatory Visit (HOSPITAL_COMMUNITY)
Admission: RE | Admit: 2017-03-17 | Discharge: 2017-03-17 | Disposition: A | Discharge status: Home / Self Care | Payer: 59 | Source: Ambulatory Visit | Attending: Adult Health | Admitting: Adult Health

## 2017-03-17 ENCOUNTER — Ambulatory Visit: Payer: 59 | Admitting: Adult Health

## 2017-03-17 ENCOUNTER — Telehealth: Payer: Self-pay | Admitting: Adult Health

## 2017-03-17 ENCOUNTER — Encounter: Payer: Self-pay | Admitting: Adult Health

## 2017-03-17 VITALS — BP 124/90 | HR 98 | Ht 66.5 in | Wt 194.0 lb

## 2017-03-17 DIAGNOSIS — N951 Menopausal and female climacteric states: Secondary | ICD-10-CM | POA: Diagnosis not present

## 2017-03-17 DIAGNOSIS — Z1212 Encounter for screening for malignant neoplasm of rectum: Secondary | ICD-10-CM

## 2017-03-17 DIAGNOSIS — Z01419 Encounter for gynecological examination (general) (routine) without abnormal findings: Secondary | ICD-10-CM | POA: Diagnosis present

## 2017-03-17 DIAGNOSIS — Z1151 Encounter for screening for human papillomavirus (HPV): Secondary | ICD-10-CM | POA: Diagnosis not present

## 2017-03-17 DIAGNOSIS — Z1211 Encounter for screening for malignant neoplasm of colon: Secondary | ICD-10-CM

## 2017-03-17 LAB — HEMOCCULT GUIAC POC 1CARD (OFFICE): FECAL OCCULT BLD: NEGATIVE

## 2017-03-17 MED ORDER — ESTRADIOL 1 MG PO TABS: 1.0000 mg | ORAL_TABLET | Freq: Every day | ORAL | 6 refills | Status: DC

## 2017-03-17 MED ORDER — PROGESTERONE MICRONIZED 200 MG PO CAPS: ORAL_CAPSULE | ORAL | 6 refills | Status: DC

## 2017-03-17 NOTE — Telephone Encounter (Signed)
Pt aware of risks and benefits of HRT and wants to try will rx Prometrium and estrace

## 2017-03-17 NOTE — Patient Instructions (Signed)

## 2017-03-17 NOTE — Telephone Encounter (Signed)
Patient called stating that she was seen today and she was told by Yvonne Lang that if she wanted to get on the hormone medication to call back and she would call it in to her pharmacy. Please contact pt

## 2017-03-17 NOTE — Progress Notes (Signed)
Patient ID: Yvonne Lang, female   DOB: 03-16-1966, 51 y.o.   MRN: [id number] History of Present Illness: Yvonne Lang is a 51 year old white female in for well woman gyn exam and pap. PCP is Dr Gerarda Fraction.    Current Medications, Allergies, Past Medical History, Past Surgical History, Family History and Social History were reviewed in [facility] record.     Review of Systems:  Patient denies any headaches, hearing loss, fatigue, blurred vision, chest pain, abdominal pain, problems with bowel movements, urination, or intercourse(not having sex). No joint pain or mood swings. +short of breath, has had bronchitis for 2 months, seeing Dr Luan Pulling in 2 weeks. +hot flashes and sweats.  Physical Exam:BP 124/90 (BP Location: Right Arm, Patient Position: Sitting, Cuff Size: Normal)   Pulse 98   Ht 5' 6.5" (1.689 m)   Wt 194 lb (88 kg)   BMI 30.84 kg/m  General:  Well developed, well nourished, no acute distress Skin:  Warm and dry Neck:  Midline trachea, normal thyroid, good ROM, no lymphadenopathy Lungs; Clear to auscultation bilaterally Breast:  No dominant palpable mass, retraction, or nipple discharge Cardiovascular: Regular rate and rhythm Abdomen:  Soft, non tender, no hepatosplenomegaly Pelvic:  External genitalia is normal in appearance, no lesions.  The vagina is normal in appearance. Urethra has no lesions or masses. The cervix is smooth,pap with HPV performed.  Uterus is felt to be normal size, shape, and contour.  No adnexal masses or tenderness noted.Bladder is non tender, no masses felt. Rectal: Good sphincter tone, no polyps, or hemorrhoids felt.  Hemoccult negative. Extremities/musculoskeletal:  No swelling or varicosities noted, no clubbing or cyanosis Psych:  No mood changes, alert and cooperative,seems happy PHQ 2 score 0.  Impression: 1. Encounter for gynecological examination with Papanicolaou smear of cervix   2. Screening for colorectal cancer   3.  Hot flashes due to menopause       Plan: Physical in 1 year Pap in 3 if normal Labs with PCP Review handout on HRT and let me know if wants to try

## 2017-03-18 LAB — CYTOLOGY - PAP
DIAGNOSIS: NEGATIVE
HPV (WINDOPATH): NOT DETECTED

## 2017-05-03 ENCOUNTER — Telehealth: Payer: Self-pay | Admitting: *Deleted

## 2017-05-03 NOTE — Telephone Encounter (Signed)
Increase estrace to 2 daily and let me know if that is better,in about a week, and will rx estrace 2 mg then

## 2017-05-16 ENCOUNTER — Telehealth: Payer: Self-pay | Admitting: *Deleted

## 2017-05-16 MED ORDER — ESTRADIOL 2 MG PO TABS: 2.0000 mg | ORAL_TABLET | Freq: Every day | ORAL | 6 refills | Status: DC

## 2017-05-16 NOTE — Telephone Encounter (Signed)
Refill estrace at 2 mg 1 daily

## 2017-05-31 ENCOUNTER — Ambulatory Visit (HOSPITAL_COMMUNITY)
Admission: RE | Admit: 2017-05-31 | Discharge: 2017-05-31 | Disposition: A | Discharge status: Home / Self Care | Payer: 59 | Source: Ambulatory Visit | Attending: Physician Assistant | Admitting: Physician Assistant

## 2017-05-31 ENCOUNTER — Other Ambulatory Visit (HOSPITAL_COMMUNITY): Payer: Self-pay | Admitting: Physician Assistant

## 2017-05-31 DIAGNOSIS — M5136 Other intervertebral disc degeneration, lumbar region: Secondary | ICD-10-CM | POA: Diagnosis not present

## 2017-05-31 DIAGNOSIS — M25551 Pain in right hip: Secondary | ICD-10-CM

## 2017-05-31 DIAGNOSIS — Z981 Arthrodesis status: Secondary | ICD-10-CM | POA: Diagnosis not present

## 2017-09-06 ENCOUNTER — Other Ambulatory Visit (HOSPITAL_COMMUNITY): Payer: Self-pay | Admitting: Internal Medicine

## 2017-09-06 DIAGNOSIS — R222 Localized swelling, mass and lump, trunk: Secondary | ICD-10-CM

## 2017-09-20 ENCOUNTER — Ambulatory Visit (HOSPITAL_COMMUNITY)
Admission: RE | Admit: 2017-09-20 | Discharge: 2017-09-20 | Disposition: A | Discharge status: Home / Self Care | Payer: 59 | Source: Ambulatory Visit | Attending: Internal Medicine | Admitting: Internal Medicine

## 2017-09-20 ENCOUNTER — Other Ambulatory Visit (HOSPITAL_COMMUNITY): Payer: Self-pay | Admitting: Internal Medicine

## 2017-09-20 ENCOUNTER — Encounter (HOSPITAL_COMMUNITY): Payer: 59

## 2017-09-20 DIAGNOSIS — R222 Localized swelling, mass and lump, trunk: Secondary | ICD-10-CM | POA: Insufficient documentation

## 2017-09-20 MED ORDER — LIDOCAINE HCL (PF) 1 % IJ SOLN
INTRAMUSCULAR | Status: AC
  Administered 2017-09-20: 5 mL
  Filled 2017-09-20: qty 5

## 2017-09-20 MED ORDER — LIDOCAINE-EPINEPHRINE (PF) 1 %-1:200000 IJ SOLN
INTRAMUSCULAR | Status: AC
  Administered 2017-09-20: 5 mL
  Filled 2017-09-20: qty 30

## 2017-09-20 MED ORDER — SODIUM CHLORIDE 0.9% FLUSH
INTRAVENOUS | Status: AC
  Filled 2017-09-20: qty 10

## 2017-09-20 MED ORDER — SODIUM BICARBONATE 4 % IV SOLN
INTRAVENOUS | Status: AC
  Filled 2017-09-20: qty 10

## 2017-09-27 ENCOUNTER — Encounter (HOSPITAL_COMMUNITY): Payer: 59

## 2017-09-28 ENCOUNTER — Encounter (HOSPITAL_COMMUNITY): Payer: Self-pay | Admitting: *Deleted

## 2017-09-28 NOTE — Progress Notes (Signed)
I spoke with patient via telephone.  I called to establish relationship with her and give her a contact for our office.  She has been referred to [place] by her PCP.  Patient states that her family wishes for her to receive care in [place].  I explained to her that if she decides to transfer her care here, we would be more than glad to assume her care.  She was given my direct number and advised to call me with any questions or concerns.

## 2017-09-29 ENCOUNTER — Telehealth: Payer: Self-pay | Admitting: Hematology

## 2017-09-29 ENCOUNTER — Encounter: Payer: Self-pay | Admitting: Hematology

## 2017-09-29 NOTE — Telephone Encounter (Signed)
Pt has been scheduled to see Dr. Burr Medico on 7/29 at 230pm. [facility], GI Navigator, will notify the pt . Letter mailed.

## 2017-09-30 ENCOUNTER — Other Ambulatory Visit (HOSPITAL_COMMUNITY): Payer: Self-pay | Admitting: Internal Medicine

## 2017-09-30 ENCOUNTER — Telehealth: Payer: Self-pay | Admitting: Hematology

## 2017-09-30 NOTE — Telephone Encounter (Signed)
Pt's appt has been rescheduled for her to see Dr. Burr Medico on 7/16 at 1130am. Pt aware to arrive 30 minutes early.

## 2017-09-30 NOTE — Progress Notes (Signed)
  Oncology Nurse Navigator Documentation  Navigator Location: [facility] (09/30/17 1603) Referral date to RadOnc/MedOnc: 09/29/17 (09/30/17 1603) )Navigator Encounter Type: Introductory phone call;Telephone (09/30/17 1603) Telephone: [facility] Crocker Call;Appt Confirmation/Clarification (09/30/17 1603) Abnormal Finding Date: 09/20/17 (09/30/17 1603) Confirmed Diagnosis Date: 09/27/17 (09/30/17 1603)  Called pt to give directions to [facility] and to assess barriers to tx. No barriers to tx identified at this time.  Will continue to follow.                     Interventions: Psycho-social support (09/30/17 1603)                      Time Spent with Patient: 15 (09/30/17 1603)

## 2017-10-04 ENCOUNTER — Inpatient Hospital Stay: Payer: 59

## 2017-10-04 ENCOUNTER — Telehealth: Payer: Self-pay

## 2017-10-04 ENCOUNTER — Encounter: Payer: Self-pay | Admitting: Hematology

## 2017-10-04 ENCOUNTER — Other Ambulatory Visit: Payer: Self-pay

## 2017-10-04 ENCOUNTER — Ambulatory Visit (HOSPITAL_COMMUNITY): Payer: 59 | Admitting: Hematology

## 2017-10-04 ENCOUNTER — Inpatient Hospital Stay: Payer: 59 | Attending: Hematology | Admitting: Hematology

## 2017-10-04 VITALS — BP 111/81 | HR 99 | Temp 98.2°F | Resp 18 | Ht 66.5 in | Wt 195.9 lb

## 2017-10-04 DIAGNOSIS — R1013 Epigastric pain: Principal | ICD-10-CM

## 2017-10-04 DIAGNOSIS — F419 Anxiety disorder, unspecified: Secondary | ICD-10-CM

## 2017-10-04 DIAGNOSIS — Z9884 Bariatric surgery status: Secondary | ICD-10-CM | POA: Diagnosis not present

## 2017-10-04 DIAGNOSIS — Z800 Family history of malignant neoplasm of digestive organs: Secondary | ICD-10-CM | POA: Diagnosis not present

## 2017-10-04 DIAGNOSIS — Z7982 Long term (current) use of aspirin: Secondary | ICD-10-CM | POA: Insufficient documentation

## 2017-10-04 DIAGNOSIS — C801 Malignant (primary) neoplasm, unspecified: Secondary | ICD-10-CM | POA: Insufficient documentation

## 2017-10-04 DIAGNOSIS — G8929 Other chronic pain: Secondary | ICD-10-CM

## 2017-10-04 DIAGNOSIS — M5417 Radiculopathy, lumbosacral region: Secondary | ICD-10-CM | POA: Diagnosis not present

## 2017-10-04 DIAGNOSIS — Z808 Family history of malignant neoplasm of other organs or systems: Secondary | ICD-10-CM | POA: Insufficient documentation

## 2017-10-04 DIAGNOSIS — F3181 Bipolar II disorder: Secondary | ICD-10-CM | POA: Insufficient documentation

## 2017-10-04 DIAGNOSIS — R110 Nausea: Secondary | ICD-10-CM | POA: Insufficient documentation

## 2017-10-04 DIAGNOSIS — F17210 Nicotine dependence, cigarettes, uncomplicated: Secondary | ICD-10-CM

## 2017-10-04 DIAGNOSIS — Z79899 Other long term (current) drug therapy: Secondary | ICD-10-CM | POA: Diagnosis not present

## 2017-10-04 DIAGNOSIS — K219 Gastro-esophageal reflux disease without esophagitis: Secondary | ICD-10-CM

## 2017-10-04 DIAGNOSIS — F329 Major depressive disorder, single episode, unspecified: Secondary | ICD-10-CM

## 2017-10-04 DIAGNOSIS — R51 Headache: Secondary | ICD-10-CM | POA: Diagnosis not present

## 2017-10-04 DIAGNOSIS — R0602 Shortness of breath: Secondary | ICD-10-CM | POA: Insufficient documentation

## 2017-10-04 DIAGNOSIS — C7989 Secondary malignant neoplasm of other specified sites: Secondary | ICD-10-CM | POA: Insufficient documentation

## 2017-10-04 DIAGNOSIS — E876 Hypokalemia: Secondary | ICD-10-CM

## 2017-10-04 DIAGNOSIS — C3431 Malignant neoplasm of lower lobe, right bronchus or lung: Secondary | ICD-10-CM | POA: Insufficient documentation

## 2017-10-04 DIAGNOSIS — Z8249 Family history of ischemic heart disease and other diseases of the circulatory system: Secondary | ICD-10-CM | POA: Insufficient documentation

## 2017-10-04 LAB — CMP (CANCER CENTER ONLY)
ALT: 100 U/L — AB (ref 0–44)
AST: 31 U/L (ref 15–41)
Albumin: 3.1 g/dL — ABNORMAL LOW (ref 3.5–5.0)
Alkaline Phosphatase: 187 U/L — ABNORMAL HIGH (ref 38–126)
Anion gap: 9 (ref 5–15)
BUN: 6 mg/dL (ref 6–20)
CALCIUM: 8.4 mg/dL — AB (ref 8.9–10.3)
CO2: 27 mmol/L (ref 22–32)
CREATININE: 0.78 mg/dL (ref 0.44–1.00)
Chloride: 104 mmol/L (ref 98–111)
GFR, Est AFR Am: >60 mL/min (ref >60)
Glucose, Bld: 130 mg/dL — ABNORMAL HIGH (ref 70–99)
POTASSIUM: 2.8 mmol/L — AB (ref 3.5–5.1)
Sodium: 140 mmol/L (ref 135–145)
TOTAL PROTEIN: 6.2 g/dL — AB (ref 6.5–8.1)

## 2017-10-04 LAB — CBC WITH DIFFERENTIAL (CANCER CENTER ONLY)
BASOS ABS: 0.1 10*3/uL (ref 0.0–0.1)
BASOS PCT: 1 %
EOS ABS: 0.1 10*3/uL (ref 0.0–0.5)
EOS PCT: 1 %
HCT: 38.1 % (ref 34.8–46.6)
Hemoglobin: 12.2 g/dL (ref 11.6–15.9)
Lymphocytes Relative: 25 %
Lymphs Abs: 4.8 10*3/uL — ABNORMAL HIGH (ref 0.9–3.3)
MCH: 27.9 pg (ref 25.1–34.0)
MCHC: 32 g/dL (ref 31.5–36.0)
MCV: 87.2 fL (ref 79.5–101.0)
Monocytes Absolute: 1.5 10*3/uL — ABNORMAL HIGH (ref 0.1–0.9)
Monocytes Relative: 8 %
Neutro Abs: 12.4 10*3/uL — ABNORMAL HIGH (ref 1.5–6.5)
Neutrophils Relative %: 65 %
PLATELETS: 538 10*3/uL — AB (ref 145–400)
RBC: 4.37 MIL/uL (ref 3.70–5.45)
RDW: 12.8 % (ref 11.2–14.5)
WBC: 19 10*3/uL — AB (ref 3.9–10.3)

## 2017-10-04 LAB — FERRITIN: Ferritin: 58 ng/mL (ref 11–307)

## 2017-10-04 LAB — CEA (IN HOUSE-CHCC): CEA (CHCC-IN HOUSE): 38.09 ng/mL — AB (ref 0.00–5.00)

## 2017-10-04 MED ORDER — POTASSIUM CHLORIDE CRYS ER 20 MEQ PO TBCR: 20.0000 meq | EXTENDED_RELEASE_TABLET | Freq: Two times a day (BID) | ORAL | 0 refills | Status: DC

## 2017-10-04 MED ORDER — SUCRALFATE 1 G PO TABS: 1.0000 g | ORAL_TABLET | Freq: Four times a day (QID) | ORAL | 1 refills | Status: DC

## 2017-10-04 MED ORDER — DICYCLOMINE HCL 20 MG PO TABS: 20.0000 mg | ORAL_TABLET | Freq: Four times a day (QID) | ORAL | 1 refills | Status: AC

## 2017-10-04 MED ORDER — ALPRAZOLAM 0.25 MG PO TABS: 0.2500 mg | ORAL_TABLET | Freq: Three times a day (TID) | ORAL | 0 refills | Status: DC | PRN

## 2017-10-04 MED ORDER — PANTOPRAZOLE SODIUM 40 MG IV SOLR: 40.0000 mg | Freq: Two times a day (BID) | INTRAVENOUS | Status: DC

## 2017-10-04 MED ORDER — LORAZEPAM 1 MG PO TABS: 1.0000 mg | ORAL_TABLET | Freq: Once | ORAL | 0 refills | Status: DC | PRN

## 2017-10-04 MED ORDER — TRAMADOL HCL 50 MG PO TABS: 50.0000 mg | ORAL_TABLET | Freq: Four times a day (QID) | ORAL | 0 refills | Status: DC | PRN

## 2017-10-04 NOTE — Telephone Encounter (Signed)
Left voice message for patient that her potassium was low today, I have sent a prescription for KCL 20 meq she is to take one twice daily.  We will retest on her next visit.

## 2017-10-04 NOTE — Progress Notes (Signed)
KCL 20 meq one bid sent into [facility] [place]

## 2017-10-04 NOTE — Progress Notes (Signed)
[place]  [phone] [phone]  Clinic New Consult Note   Patient Care Team: Redmond School, MD as PCP - General (Internal Medicine) 10/04/2017  Referring Physician: Dr Gerarda Fraction  CHIEF COMPLAINTS/PURPOSE OF CONSULTATION:  Newly diagnosed metastatic adenocarcinoma  HISTORY OF PRESENTING ILLNESS:  Yvonne Lang 52 y.o. female who was referred to me by her PCP Dr. Gerarda Fraction. She is here today for newly diagnosed metastatic adenocarcinoma.  She is accompanied by her sister and her mother to my clinic today.    She initially presented with a palpable mass inferior medical to the inframammary fold of the left breast about 3 months ago,  which was recently biopsied and revealed metastatic adenocarcinoma, favor upper GI origin.  She noticed the breast knot 3 months ago and it was a size of a nickel initially, and it has increased in size over the past 3 months.  She was seen by a primary care physician who referred her mammogram, which reviewed no mass in the breasts, and biopsy of the chest wall mass was performed.  She has a gastric bypass in early 2000s and is having GERD that are increasing in severity now. She uses sucralfate and omeprazole more frequently. It is associated with sharp knife-like epigastric pain that is getting worse and more frequent now and lasts 10 minutes and comes at least once a week. She sometimes gets dysphagia, but it's neither constant nor significant. No other abdominal pain. She also has new onset headaches in the frontal area that is constant and are 6-7/10 for the past 2 to 3 weeks.  She never had these headaches before and she uses Tylenol that provides minimal relief.    For pertinent past medical history, she also had 10-12 inches removed from her colon. No melena or hematochezia. No hematuria. No changes in bowel habits. She's been having back pains that are getting worse. She takes Tylenol for it and it helps. She reports  history of back surgery. She was also diagnosed with COPD, depression, and anxiety.   She was on 3 months of progesterone and estriol for hot flashes and stopped  When she found about the new breast mass 3 weeks ago.   Her sister died of skin cancer. Her uncle had colon cancer at age 78.  She lives with her mother. No children. No alcohol. She chews a nicotine gum as she's trying to quit smoking. She's been smoking since age 37.    MEDICAL HISTORY:  Past Medical History:  Diagnosis Date  . Anxiety   . Bronchitis   . COPD (chronic obstructive pulmonary disease) ([place])   . Depression   . GERD (gastroesophageal reflux disease)   . H/O gastric bypass    1999  . Irritable bowel syndrome     SURGICAL HISTORY: Past Surgical History:  Procedure Laterality Date  . BACK SURGERY    . CHOLECYSTECTOMY    . COLON SURGERY     for a kink in her colon and 12 inchs removed  . COLONOSCOPY N/A 12/13/2013   Procedure: COLONOSCOPY;  Surgeon: Rogene Houston, MD;  Location: [facility];  Service: Endoscopy;  Laterality: N/A;  200  . ESOPHAGOGASTRODUODENOSCOPY N/A 12/13/2013   Procedure: ESOPHAGOGASTRODUODENOSCOPY (EGD);  Surgeon: Rogene Houston, MD;  Location: [facility];  Service: Endoscopy;  Laterality: N/A;    SOCIAL HISTORY: Social History   Socioeconomic History  . Marital status: Single    Spouse name: Not on file  . Number of children:  Not on file  . Years of education: Not on file  . Highest education level: Not on file  Occupational History  . Not on file  Social Needs  . Financial resource strain: Not on file  . Food insecurity:    Worry: Not on file    Inability: Not on file  . Transportation needs:    Medical: Not on file    Non-medical: Not on file  Tobacco Use  . Smoking status: Current Every Day Smoker    Packs/day: 1.00    Years: 40.00    Pack years: 40.00    Types: Cigarettes  . Smokeless tobacco: Never Used  . Tobacco comment: pack a day 30 yrs  Substance  and Sexual Activity  . Alcohol use: No  . Drug use: No  . Sexual activity: Not Currently    Birth control/protection: Post-menopausal  Lifestyle  . Physical activity:    Days per week: Not on file    Minutes per session: Not on file  . Stress: Not on file  Relationships  . Social connections:    Talks on phone: Not on file    Gets together: Not on file    Attends religious service: Not on file    Active member of club or organization: Not on file    Attends meetings of clubs or organizations: Not on file    Relationship status: Not on file  . Intimate partner violence:    Fear of current or ex partner: Not on file    Emotionally abused: Not on file    Physically abused: Not on file    Forced sexual activity: Not on file  Other Topics Concern  . Not on file  Social History Narrative  . Not on file    FAMILY HISTORY: Family History  Problem Relation Age of Onset  . Other Mother        has a pacemaker; rectal prolapse; rods in legs; lung fibrosis   . Other Father        heart gave out  . Hypertension Sister   . Bronchitis Sister   . Heart attack Paternal Grandmother   . COPD Maternal Grandmother   . Emphysema Maternal Grandmother   . Alcoholism Maternal Grandfather   . Cancer Sister        rare skin cancer  . Cancer Maternal Uncle 7       colon cancer     ALLERGIES:  is allergic to codeine.  MEDICATIONS:  Current Outpatient Medications  Medication Sig Dispense Refill  . albuterol (PROVENTIL HFA) 108 (90 Base) MCG/ACT inhaler Inhale 2 puffs into the lungs 2 (two) times daily.     Marland Kitchen albuterol (PROVENTIL) 4 MG tablet Take 4 mg by mouth 2 (two) times daily.     . Ascorbic Acid (VITAMIN C) 100 MG tablet Take 100 mg by mouth daily.    Marland Kitchen aspirin 81 MG tablet Take 81 mg by mouth daily.    Marland Kitchen CALCIUM PO Take by mouth daily.    Marland Kitchen dicyclomine (BENTYL) 20 MG tablet Take 1 tablet (20 mg total) by mouth every 6 (six) hours. 60 tablet 1  . DULoxetine (CYMBALTA) 60 MG capsule  Take 60 mg by mouth daily.    Marland Kitchen gabapentin (NEURONTIN) 600 MG tablet Take 600 mg by mouth 4 (four) times daily.     . methocarbamol (ROBAXIN) 750 MG tablet Take 750 mg by mouth 4 (four) times daily.    . Multiple Vitamin (MULTIVITAMIN) tablet  Take 1 tablet by mouth daily.    . Omega-3 Fatty Acids (FISH OIL) 600 MG CAPS Take 600 mg by mouth daily.     Marland Kitchen omeprazole (PRILOSEC) 20 MG capsule Take 20 mg by mouth daily.    . predniSONE (DELTASONE) 10 MG tablet Take 10 mg by mouth 3 (three) times daily.    . Probiotic Product (PROBIOTIC-10 PO) Take by mouth.    . RaNITidine HCl (ZANTAC PO) Take by mouth 3 (three) times daily.    . traMADol (ULTRAM) 50 MG tablet Take 1 tablet (50 mg total) by mouth every 6 (six) hours as needed. 90 tablet 0  . UNABLE TO FIND Inhale into the lungs. Med Name: Trilogy as needed    . ALPRAZolam (XANAX) 0.25 MG tablet Take 1 tablet (0.25 mg total) by mouth 3 (three) times daily as needed for anxiety. 30 tablet 0  . estradiol (ESTRACE) 2 MG tablet Take 1 tablet (2 mg total) by mouth daily. (Patient not taking: Reported on 10/04/2017) 30 tablet 6  . LORazepam (ATIVAN) 1 MG tablet Take 1 tablet (1 mg total) by mouth once as needed for up to 1 dose for anxiety. 2 tablet 0  . progesterone (PROMETRIUM) 200 MG capsule Take 1 daily at HS (Patient not taking: Reported on 10/04/2017) 30 capsule 6  . sucralfate (CARAFATE) 1 g tablet Take 1 tablet (1 g total) by mouth 4 (four) times daily. 120 tablet 1   No current facility-administered medications for this visit.     REVIEW OF SYSTEMS:  Constitutional: Denies fevers, chills or abnormal night sweats (+) frontal headaches Eyes: Denies blurriness of vision, double vision or watery eyes Ears, nose, mouth, throat, and face: Denies mucositis or sore throat Respiratory: Denies cough, (+) SOB and Wheezing Cardiovascular: Denies palpitation, chest discomfort or lower extremity swelling Gastrointestinal:  Denies nausea, heartburn or change in  bowel habits (+) sharp epigastric abdominal pain (+) history of gastric bypass Skin: Denies abnormal skin rashes Lymphatics: Denies new lymphadenopathy or easy bruising Neurological:Denies numbness, tingling or new weaknesses Behavioral/Psych: Mood is stable, no new changes (+) history of depression and anxiety All other systems were reviewed with the patient and are negative.  PHYSICAL EXAMINATION: ECOG PERFORMANCE STATUS: 1 - Symptomatic but completely ambulatory  Vitals:   10/04/17 1129  BP: 111/81  Pulse: 99  Resp: 18  Temp: 98.2 F (36.8 C)  SpO2: 100%   Filed Weights   10/04/17 1129  Weight: 195 lb 14.4 oz (88.9 kg)    GENERAL:alert, no distress and comfortable (+) chewing nicotine gum SKIN: skin color, texture, turgor are normal, no rashes or significant lesions EYES: normal, conjunctiva are pink and non-injected, sclera clear OROPHARYNX:no exudate, no erythema and lips, buccal mucosa, and tongue normal  NECK: supple, thyroid normal size, non-tender, without nodularity LYMPH:  no palpable lymphadenopathy in the cervical, axillary or inguinal LUNGS: clear to percussion (+) bilateral wheezing (+) SOB HEART: regular rate & rhythm and no murmurs and no lower extremity edema ABDOMEN:abdomen soft, non-tender and normal bowel sounds Musculoskeletal:no cyanosis of digits and no clubbing  PSYCH: alert & oriented x 3 with fluent speech NEURO: no focal motor/sensory deficits Breast: (+) 3x4 cm low middle percutaneous chest  mass  LABORATORY DATA:  I have reviewed the data as listed CBC Latest Ref Rng & Units 10/04/2017 10/25/2013  WBC 3.9 - 10.3 K/uL 19.0(H) 8.8  Hemoglobin 11.6 - 15.9 g/dL 12.2 12.1  Hematocrit 34.8 - 46.6 % 38.1 36.3  Platelets 145 -  400 K/uL 538(H) 361   CMP Latest Ref Rng & Units 10/25/2013  Glucose 70 - 99 mg/dL 120(H)  BUN 6 - 23 mg/dL 4(L)  Creatinine 0.50 - 1.10 mg/dL 0.59  Sodium 135 - 145 mEq/L 144  Potassium 3.5 - 5.3 mEq/L 4.0  Chloride 96 - 112  mEq/L 107  CO2 19 - 32 mEq/L 28  Calcium 8.4 - 10.5 mg/dL 9.0  Total Protein 6.0 - 8.3 g/dL 6.1  Total Bilirubin 0.2 - 1.2 mg/dL 0.2  Alkaline Phos 39 - 117 U/L 82  AST 0 - 37 U/L 18  ALT 0 - 35 U/L 20     Pathology  09/20/2017 Breast Biopsy Diagnosis Soft Tissue Needle Core Biopsy, inferior medial to IMF - ADENOCARCINOMA. - SEE MICROSCOPIC DESCRIPTION. Microscopic Comment Immunohistochemistry will be performed and reported as an addendum. (JDP:ah 09/23/17) ADDENDUM: Immunohistochemistry shows the tumor is strongly positive with cytokeratin AE1/AE3, cytokeratin 7 and shows moderate weak positivity with CDX-2. The tumor is negative with estrogen receptor, progesterone receptor, GCDFP, GATA-3, Napsin A, TTF-1, WT-1 and cytokeratin 20. The immunophenotype is consistent with metastatic carcinoma. The combination of CDX-2 and cytokeratin 7 positivity raises the possibility of an upper gastrointestinal primary. Clinical correlation is essential.ical Information Specimen(s) Obtained: Soft Tissue Needle Core Biopsy, inferior medial to IMF Specimen Clinical Information not definitely in the breast palpable mass x 1 month; ? lymph node r/o malignancy Gross Received in two containers (one with formalin, one with saline) are three cores of gray-white to dark soft firm tissue (one core in saline, two in formalin) which range from 1 x 0.1 cm to 1.3 x 0.1 cm. The core from saline is submitted in RPMI for possible flow cytometry, and two cores from formalin are submitted in one block for routine histology.    RADIOGRAPHIC STUDIES: I have personally reviewed the radiological images as listed and agreed with the findings in the report. US Breast Ltd Uni Left Inc Axilla  Result Date: 09/20/2017 CLINICAL DATA:  52 year old female with a palpable abnormality in the far lower inner left breast. EXAM: DIGITAL DIAGNOSTIC UNILATERAL LEFT MAMMOGRAM WITH CAD AND TOMO LEFT BREAST ULTRASOUND COMPARISON:  Previous  exam(s). ACR Breast Density Category b: There are scattered areas of fibroglandular density. FINDINGS: No suspicious masses or calcifications are seen in the left breast. Spot compression tangential tomograms g were performed over the palpable area of concern in the far lower inner left breast with no definite abnormalities identified. Mammographic images were processed with CAD. Physical examination at site of palpable concern reveals a firm palpable mass along the inferior medial margin of the inframammary fold. Targeted ultrasound at site palpable concern inferior medial to the inframammary fold of the left breast was performed. There is an oval circumscribed hypoechoic mass with lobulated margins measuring 3.2 x 1.7 x 2.8 cm. No lymphadenopathy seen in the left axilla. IMPRESSION: Indeterminate palpable mass just inferior medial to the inframammary fold of the left breast. RECOMMENDATION: Ultrasound-guided biopsy of the palpable mass inferior medial to the inframammary fold of the left breast is recommended. This will subsequently be performed and dictated separately. I have discussed the findings and recommendations with the patient. Results were also provided in writing at the conclusion of the visit. If applicable, a reminder letter will be sent to the patient regarding the next appointment. BI-RADS CATEGORY  4: Suspicious. Electronically Signed   By: Everlean Alstrom M.D.   On: 09/20/2017 15:35   Mm Diag Breast Tomo Uni Left  Result Date:  09/20/2017 CLINICAL DATA:  52 year old female with a palpable abnormality in the far lower inner left breast. EXAM: DIGITAL DIAGNOSTIC UNILATERAL LEFT MAMMOGRAM WITH CAD AND TOMO LEFT BREAST ULTRASOUND COMPARISON:  Previous exam(s). ACR Breast Density Category b: There are scattered areas of fibroglandular density. FINDINGS: No suspicious masses or calcifications are seen in the left breast. Spot compression tangential tomograms g were performed over the palpable area of  concern in the far lower inner left breast with no definite abnormalities identified. Mammographic images were processed with CAD. Physical examination at site of palpable concern reveals a firm palpable mass along the inferior medial margin of the inframammary fold. Targeted ultrasound at site palpable concern inferior medial to the inframammary fold of the left breast was performed. There is an oval circumscribed hypoechoic mass with lobulated margins measuring 3.2 x 1.7 x 2.8 cm. No lymphadenopathy seen in the left axilla. IMPRESSION: Indeterminate palpable mass just inferior medial to the inframammary fold of the left breast. RECOMMENDATION: Ultrasound-guided biopsy of the palpable mass inferior medial to the inframammary fold of the left breast is recommended. This will subsequently be performed and dictated separately. I have discussed the findings and recommendations with the patient. Results were also provided in writing at the conclusion of the visit. If applicable, a reminder letter will be sent to the patient regarding the next appointment. BI-RADS CATEGORY  4: Suspicious. Electronically Signed   By: Everlean Alstrom M.D.   On: 09/20/2017 15:35   [place] Lt Breast Bx W Loc Dev 1st Lesion Img Bx Spec US Guide  Addendum Date: 09/27/2017   ADDENDUM REPORT: 09/27/2017 14:37 ADDENDUM: Pathology results: Pathology results from the ultrasound-guided biopsy of the palpable mass in the soft tissues of the midline chest/upper epigastric region just inferior medial to the left breast inframammary fold revealed adenocarcinoma, possibly of gastrointestinal origin. This is concordant with the imaging findings. The patient has been notified of the results. She is doing well and denies any biopsy site complications. These results were discussed with the patient's primary care physician, Dr. Gerarda Fraction, and he has already referred her to an oncologist for further care. Annual routine screening mammography is recommended. The  patient has been instructed to call the [facility] with any questions or concerns. Electronically Signed   By: Everlean Alstrom M.D.   On: 09/27/2017 14:37   Result Date: 09/27/2017 CLINICAL DATA:  52 year old female with a palpable mass inferior medial to the inframammary fold of the left breast. EXAM: ULTRASOUND GUIDED LEFT BREAST CORE NEEDLE BIOPSY COMPARISON:  Previous exam(s). FINDINGS: I met with the patient and we discussed the procedure of ultrasound-guided biopsy, including benefits and alternatives. We discussed the high likelihood of a successful procedure. We discussed the risks of the procedure, including infection, bleeding, tissue injury, clip migration, and inadequate sampling. Informed written consent was given. The usual time-out protocol was performed immediately prior to the procedure. Lesion quadrant: Lower inner Using sterile technique and 1% Lidocaine as local anesthetic, under direct ultrasound visualization, a 14 gauge spring-loaded device was used to perform biopsy of the palpable mass inferior medial to the inframammary fold of the left breast using a medial to lateral approach. At the conclusion of the procedure a HydroMARK tissue marker clip was deployed into the biopsy cavity. A post biopsy mammogram was not performed as this mass could not be included in the field of view on diagnostic mammography due to far inferior medial location. IMPRESSION: Ultrasound guided biopsy of the palpable mass inferior medial to the  inframammary fold of the left breast. No apparent complications. Electronically Signed: By: Everlean Alstrom M.D. On: 09/20/2017 16:13    ASSESSMENT & PLAN:  Oona Trammel is a 52 y.o. female with history of bipolar II and lumbosacral radiculitis.   1. 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 The  Immunohistochemistry shows the tumor is strongly positive with cytokeratin AE1/AE3, cytokeratin 7 and shows moderate weak positivity with CDX-2. The tumor is negative with estrogen receptor, progesterone receptor, GCDFP, GATA-3, Napsin A, TTF-1, WT-1 and cytokeratin 20. The immunophenotype is consistent with metastatic carcinoma, favor upper GI origin.  She had a history of gastric bypass in the past, chronic GERD, and has worsening gastric pain and burning sensation lately, which requires high-dose antacid medications. -Given her extensive smoking history, her worsening respiratory symptoms lately, primary lung cancer, is also possibility, although TTF-1 was negative which does not support lung primary.   -I recommend PET scan look for other possible primary and other metastasis.  Due to her new onset of headache, I will also obtain a brain MRI and r/o brain metastasis. -I will refer her to lab our GI for upper endoscopy, and possible colonoscopy to look for primary tumor. -will do lab including CBC, CMP, CEA, and ferritin level today. -I plan to see her back in a week after the above work-up, to review the results, and discuss treatment options.  2. New onset frontal headaches  -She denies history of previous or chronic headaches, now presented with persistent headaches for the past 2 to 3 weeks. -I will obtain a MRI scan of brain to r/o brain metastasis  3. Anxiety and Depression - I prescribed alprazolam and lorazepam  4. GERD - I prescribed sucralfate and pantoprazole  5. Chronic back pain - I refilled her Tramadol  Plan: -PET scan and brain MRI w wo contrast to r/o metastasis, as soon as possible -Labs today -urgent GI referral to [place] for endoscopy  -I will see her back in about a week after the above work-up.  Orders Placed This Encounter  Procedures  . NM PET Image Initial (PI)  Skull Base To Thigh    Standing Status:   Future    Standing Expiration Date:   10/04/2018    Order Specific Question:   If indicated for the ordered procedure, I authorize the administration of a radiopharmaceutical per Radiology protocol    Answer:   Yes    Order Specific Question:   Is the patient pregnant?    Answer:   No    Order Specific Question:   Preferred imaging location?    Answer:   [facility]    Order Specific Question:   Radiology Contrast Protocol - do NOT remove file path    Answer:   \\[email or web address]  . MR Brain W Wo Contrast    Standing Status:   Future    Standing Expiration Date:   10/04/2018    Order Specific Question:   If indicated for the ordered procedure, I authorize the administration of contrast media per Radiology protocol    Answer:   Yes    Order Specific Question:   What is the patient's sedation requirement?    Answer:   No Sedation    Order Specific  Question:   Does the patient have a pacemaker or implanted devices?    Answer:   No    Order Specific Question:   Use SRS Protocol?    Answer:   No    Order Specific Question:   Radiology Contrast Protocol - do NOT remove file path    Answer:   \\[email or web address]    Order Specific Question:   Preferred imaging location?    Answer:   [facility] (table limit-350lbs)  . CBC with Differential (Cancer Center Only)    Standing Status:   Future    Number of Occurrences:   1    Standing Expiration Date:   10/05/2018  . CMP ([facility] only)    Standing Status:   Future    Number of Occurrences:   1    Standing Expiration Date:   10/05/2018  . Ferritin    Standing Status:   Future    Number of Occurrences:   1    Standing Expiration Date:   10/04/2018  . CEA (IN HOUSE-[facility])    Standing Status:   Future    Number of Occurrences:   1    Standing Expiration Date:   10/05/2018    All questions were answered. The patient knows to call the  clinic with any problems, questions or concerns. I spent 55 minutes counseling the patient face to face. The total time spent in the appointment was 60 minutes and more than 50% was on counseling.   Dierdre Searles Dweik am acting as scribe for Dr. Truitt Merle.  I have reviewed the above documentation for accuracy and completeness, and I agree with the above.    Truitt Merle, MD 10/04/2017 1:37 PM

## 2017-10-05 ENCOUNTER — Other Ambulatory Visit: Payer: Self-pay | Admitting: Hematology

## 2017-10-05 ENCOUNTER — Telehealth: Payer: Self-pay | Admitting: Hematology

## 2017-10-05 ENCOUNTER — Telehealth: Payer: Self-pay

## 2017-10-05 ENCOUNTER — Encounter: Payer: Self-pay | Admitting: Gastroenterology

## 2017-10-05 MED ORDER — ACETAMINOPHEN-CODEINE #3 300-30 MG PO TABS: 1.0000 | ORAL_TABLET | Freq: Four times a day (QID) | ORAL | 0 refills | Status: DC | PRN

## 2017-10-05 NOTE — Telephone Encounter (Signed)
Thu, please let pt know that I have called in tylenol #3 for her headache, and I am working with [facility] to get her an earlier appointment, thanks   Truitt Merle MD

## 2017-10-05 NOTE — Addendum Note (Signed)
Addended by: Truitt Merle on: 10/05/2017 12:46 PM   Modules accepted: Orders

## 2017-10-05 NOTE — Telephone Encounter (Signed)
Faxed medical records to [facility] @ [phone]. Release ID# [id number]

## 2017-10-05 NOTE — Telephone Encounter (Signed)
Patient called stating that Dr. Burr Medico wanted her to get in with [facility] first available appointment is 9/12.    She is complaining of having bad headaches and wants to know if Dr. Burr Medico can prescribe something.  Work [phone]

## 2017-10-06 ENCOUNTER — Other Ambulatory Visit: Payer: Self-pay

## 2017-10-06 ENCOUNTER — Telehealth: Payer: Self-pay

## 2017-10-06 ENCOUNTER — Other Ambulatory Visit: Payer: Self-pay | Admitting: Hematology

## 2017-10-06 DIAGNOSIS — C7989 Secondary malignant neoplasm of other specified sites: Secondary | ICD-10-CM

## 2017-10-06 DIAGNOSIS — C801 Malignant (primary) neoplasm, unspecified: Principal | ICD-10-CM

## 2017-10-06 DIAGNOSIS — C799 Secondary malignant neoplasm of unspecified site: Secondary | ICD-10-CM

## 2017-10-06 DIAGNOSIS — C3490 Malignant neoplasm of unspecified part of unspecified bronchus or lung: Secondary | ICD-10-CM | POA: Insufficient documentation

## 2017-10-06 NOTE — Telephone Encounter (Signed)
Thanks much!  Truitt Merle MD

## 2017-10-06 NOTE — Telephone Encounter (Signed)
I have contacted the patient. Scheduled for EGD on 10/10/17 arrive at 8:30 am.  Referral in Epic. Instructions over the telephone. Questions invited and answered. Not on anticoagulation. No home oxygen. Not allergic to soy or eggs. No diet pills. Consent forms sent via fax.

## 2017-10-06 NOTE — Telephone Encounter (Signed)
Beth see Dr Jillyn Hidden note

## 2017-10-06 NOTE — Telephone Encounter (Signed)
-----   Message from Milus Banister, MD sent at 10/05/2017  5:13 PM EDT ----- Regarding: RE: referral  Phylliss Blakes is out of town this week and I am out of town starting tomorrow PM and all next week.  See below however.  Nakaya Mishkin, Can you please show this to whomever is our DOD Thursday morning to help with this.  Thanks  DJ  ----- Message ----- From: Truitt Merle, MD Sent: 10/05/2017   5:03 PM To: Milus Banister, MD, Doran Stabler, MD Subject: Melton Alar: referral                                   Dr. Loletha Carrow, she is scheduled to see you in Sep. This lady needs to be see ASAP for GI work up of metastatic adeno with unknown primary. Could you see her and do the scope next week? I sent a message to Linna Hoff earlier today about this.   Thanks much,  Krista Blue  ----- Message ----- From: Truitt Merle, MD Sent: 10/05/2017  12:46 PM To: Milus Banister, MD, Nicholaus Corolla Subject: RE: referral                                   Referral order is in now.   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 Could you or your partner see her and do endoscopy as soon as possible? I ordered a PET scan to be done ASAP also  Thanks much,  yan  ----- Message ----- From: Nicholaus Corolla Sent: 10/05/2017  10:57 AM To: Truitt Merle, MD, Nicholaus Corolla Subject: referral                                       Per 7/16 los _ PET and brain MRI ASAP [place] referral   Please place referral - if still needed.  I did not see a referral placed for [place]   Thanks (:

## 2017-10-06 NOTE — Telephone Encounter (Signed)
I can schedule EGD on Monday July 22 .  Adenocarcinoma of unknown primary, her biopsy favors possible upper GI origin.  Please inform patient and schedule it if she is willing to go ahead. Thanks

## 2017-10-06 NOTE — Telephone Encounter (Signed)
Ok thanks 

## 2017-10-07 ENCOUNTER — Encounter: Payer: Self-pay | Admitting: Hematology

## 2017-10-10 ENCOUNTER — Ambulatory Visit (AMBULATORY_SURGERY_CENTER): Payer: 59 | Admitting: Gastroenterology

## 2017-10-10 ENCOUNTER — Encounter: Payer: Self-pay | Admitting: Gastroenterology

## 2017-10-10 ENCOUNTER — Other Ambulatory Visit: Payer: Self-pay | Admitting: Hematology

## 2017-10-10 ENCOUNTER — Ambulatory Visit (AMBULATORY_SURGERY_CENTER): Payer: Self-pay | Admitting: *Deleted

## 2017-10-10 VITALS — BP 96/53 | HR 79 | Temp 97.3°F | Resp 18 | Ht 66.5 in | Wt 195.0 lb

## 2017-10-10 VITALS — Ht 66.5 in | Wt 193.6 lb

## 2017-10-10 DIAGNOSIS — C801 Malignant (primary) neoplasm, unspecified: Secondary | ICD-10-CM

## 2017-10-10 DIAGNOSIS — C799 Secondary malignant neoplasm of unspecified site: Secondary | ICD-10-CM

## 2017-10-10 MED ORDER — SODIUM CHLORIDE 0.9 % IV SOLN: 500.0000 mL | Freq: Once | INTRAVENOUS | Status: DC

## 2017-10-10 MED ORDER — BUTALBITAL-APAP-CAFFEINE 50-325-40 MG PO TABS: 1.0000 | ORAL_TABLET | Freq: Four times a day (QID) | ORAL | 0 refills | Status: DC | PRN

## 2017-10-10 MED ORDER — PEG-KCL-NACL-NASULF-NA ASC-C 140 G PO SOLR: 1.0000 | Freq: Once | ORAL | 0 refills | Status: AC

## 2017-10-10 MED ORDER — OMEPRAZOLE 20 MG PO CPDR: 20.0000 mg | DELAYED_RELEASE_CAPSULE | Freq: Two times a day (BID) | ORAL | 0 refills | Status: AC

## 2017-10-10 NOTE — Progress Notes (Signed)
Pt's states no medical or surgical changes since EGD visit today .Patient denies any allergies to eggs or soy. Patient denies any problems with anesthesia/sedation. Patient denies any oxygen use at home. Patient denies taking any diet/weight loss medications or blood thinners. EMMI education offered, pt declined. [employer] pay paper given to pt.

## 2017-10-10 NOTE — Patient Instructions (Addendum)
YOU HAD AN ENDOSCOPIC PROCEDURE TODAY AT [facility]:   Refer to the procedure report that was given to you for any specific questions about what was found during the examination.  If the procedure report does not answer your questions, please call your gastroenterologist to clarify.  If you requested that your care partner not be given the details of your procedure findings, then the procedure report has been included in a sealed envelope for you to review at your convenience later.  YOU SHOULD EXPECT: Some feelings of bloating in the abdomen. Passage of more gas than usual.  Walking can help get rid of the air that was put into your GI tract during the procedure and reduce the bloating. If you had a lower endoscopy (such as a colonoscopy or flexible sigmoidoscopy) you may notice spotting of blood in your stool or on the toilet paper. If you underwent a bowel prep for your procedure, you may not have a normal bowel movement for a few days.  Please Note:  You might notice some irritation and congestion in your nose or some drainage.  This is from the oxygen used during your procedure.  There is no need for concern and it should clear up in a day or so.  SYMPTOMS TO REPORT IMMEDIATELY:   Following upper endoscopy (EGD)  Vomiting of blood or coffee ground material  New chest pain or pain under the shoulder blades  Painful or persistently difficult swallowing  New shortness of breath  Fever of 100F or higher  Black, tarry-looking stools  For urgent or emergent issues, a gastroenterologist can be reached at any hour by calling [phone].   DIET:  We do recommend a small meal at first, but then you may proceed to your regular diet.  Drink plenty of fluids but you should avoid alcoholic beverages for 24 hours.  MEDICATIONS: Continue present medications.   Please see handouts given to you by your recovery nurse.  Follow up with Dr. Loletha Carrow for a colonoscopy Wednesday October 19, 2017 at 0930 (arrive at 0830). Report for pre-visit today immediately upon discharge.  ACTIVITY:  You should plan to take it easy for the rest of today and you should NOT DRIVE or use heavy machinery until tomorrow (because of the sedation medicines used during the test).    FOLLOW UP: Our staff will call the number listed on your records the next business day following your procedure to check on you and address any questions or concerns that you may have regarding the information given to you following your procedure. If we do not reach you, we will leave a message.  However, if you are feeling well and you are not experiencing any problems, there is no need to return our call.  We will assume that you have returned to your regular daily activities without incident.  If any biopsies were taken you will be contacted by phone or by letter within the next 1-3 weeks.  Please call us at [phone] if you have not heard about the biopsies in 3 weeks.   Thank you for allowing [place] to provide for your healthcare needs today.   SIGNATURES/CONFIDENTIALITY: You and/or your care partner have signed paperwork which will be entered into your electronic medical record.  These signatures attest to the fact that that the information above on your After Visit Summary has been reviewed and is understood.  Full responsibility of the confidentiality of this discharge information lies with you  and/or your care-partner. 

## 2017-10-10 NOTE — Progress Notes (Signed)
A and O x3. Report to RN. Tolerated MAC anesthesia well.Teeth unchanged after procedure.

## 2017-10-10 NOTE — Op Note (Addendum)
[facility] Patient Name: Yvonne Lang Procedure Date: 10/10/2017 9:59 AM MRN: [id number] Endoscopist: Mauri Pole , MD Age: 52 Referring MD:  Date of Birth: July 16, 1965 Gender: Female Account #: [id number] Procedure:                Upper GI endoscopy Indications:              Diagnostic procedure (Metastatic adenocarcinoma of                            unknown primary). History of gastric bypass surgery Medicines:                Monitored Anesthesia Care Procedure:                Pre-Anesthesia Assessment:                           - Prior to the procedure, a History and Physical                            was performed, and patient medications and                            allergies were reviewed. The patient's tolerance of                            previous anesthesia was also reviewed. The risks                            and benefits of the procedure and the sedation                            options and risks were discussed with the patient.                            All questions were answered, and informed consent                            was obtained. Prior Anticoagulants: The patient has                            taken no previous anticoagulant or antiplatelet                            agents. ASA Grade Assessment: III - A patient with                            severe systemic disease. After reviewing the risks                            and benefits, the patient was deemed in                            satisfactory condition to undergo the procedure.  After obtaining informed consent, the endoscope was                            passed under direct vision. Throughout the                            procedure, the patient's blood pressure, pulse, and                            oxygen saturations were monitored continuously. The                            Endoscope was introduced through the mouth, and         advanced to the second part of duodenum. The upper                            GI endoscopy was accomplished without difficulty.                            The patient tolerated the procedure well. Scope In: Scope Out: Findings:                 The esophagus was normal.                           The Z-line was regular and was found 35 cm from the                            incisors.                           Evidence of a gastric bypass was found. A gastric                            pouch with a normal size was found. The staple line                            appeared intact. The gastrojejunal anastomosis was                            characterized by healthy appearing mucosa. This was                            traversed. The pouch-to-jejunum limb was                            characterized by healthy appearing mucosa. The                            jejunojejunal anastomosis was characterized by                            healthy appearing mucosa. The duodenum-to-jejunum  limb was not examined as it could not be found. Complications:            No immediate complications. Estimated Blood Loss:     Estimated blood loss was minimal. Impression:               - Normal esophagus.                           - Z-line regular, 35 cm from the incisors.                           - Gastric bypass with a normal-sized pouch and                            intact staple line. Gastrojejunal anastomosis                            characterized by healthy appearing mucosa.                           - No specimens collected. Recommendation:           - Patient has a contact number available for                            emergencies. The signs and symptoms of potential                            delayed complications were discussed with the                            patient. Return to normal activities tomorrow.                            Written discharge instructions  were provided to the                            patient.                           - Resume previous diet.                           - Continue present medications.                           - Follow up PET /CT scan results.                           - Possible lesion in the excluded stomach, if still                            has the remnant.                           -Will plan for colonoscopy based on PET/CT scan  results.                           Addendum: per patient insurance denied PET scan,                            she is scheduled for CT chest, abd & pelvis on July                            26th Mauri Pole, MD 10/10/2017 10:37:49 AM This report has been signed electronically.

## 2017-10-10 NOTE — Progress Notes (Deleted)
Called to room to assist during endoscopic procedure.  Patient ID and intended procedure confirmed with present staff. Received instructions for my participation in the procedure from the performing physician.  

## 2017-10-11 ENCOUNTER — Encounter: Payer: Self-pay | Admitting: Gastroenterology

## 2017-10-11 ENCOUNTER — Ambulatory Visit (HOSPITAL_COMMUNITY)
Admission: RE | Admit: 2017-10-11 | Discharge: 2017-10-11 | Disposition: A | Discharge status: Home / Self Care | Payer: 59 | Source: Ambulatory Visit | Attending: Hematology | Admitting: Hematology

## 2017-10-11 ENCOUNTER — Other Ambulatory Visit: Payer: Self-pay | Admitting: Hematology

## 2017-10-11 ENCOUNTER — Encounter (HOSPITAL_COMMUNITY): Payer: Self-pay

## 2017-10-11 ENCOUNTER — Encounter: Payer: Self-pay | Admitting: Hematology

## 2017-10-11 ENCOUNTER — Telehealth: Payer: Self-pay

## 2017-10-11 DIAGNOSIS — C801 Malignant (primary) neoplasm, unspecified: Secondary | ICD-10-CM | POA: Diagnosis present

## 2017-10-11 DIAGNOSIS — J9819 Other pulmonary collapse: Secondary | ICD-10-CM | POA: Diagnosis not present

## 2017-10-11 DIAGNOSIS — C7989 Secondary malignant neoplasm of other specified sites: Secondary | ICD-10-CM | POA: Diagnosis not present

## 2017-10-11 DIAGNOSIS — R918 Other nonspecific abnormal finding of lung field: Secondary | ICD-10-CM | POA: Insufficient documentation

## 2017-10-11 MED ORDER — IOPAMIDOL (ISOVUE-300) INJECTION 61%
INTRAVENOUS | Status: AC
  Filled 2017-10-11: qty 100

## 2017-10-11 MED ORDER — IOPAMIDOL (ISOVUE-300) INJECTION 61%
100.0000 mL | Freq: Once | INTRAVENOUS | Status: AC | PRN
  Administered 2017-10-11: 100 mL via INTRAVENOUS

## 2017-10-11 NOTE — Telephone Encounter (Signed)
  Follow up Call-  Call back number 10/10/2017  Post procedure Call Back phone  # [phone]  Permission to leave phone message Yes  Some recent data might be hidden     Patient questions:  Do you have a fever, pain , or abdominal swelling? No. Pain Score  0 *  Have you tolerated food without any problems? Yes.    Have you been able to return to your normal activities? Yes.    Do you have any questions about your discharge instructions: Diet   No. Medications  No. Follow up visit  No.  Do you have questions or concerns about your Care? No.  Actions: * If pain score is 4 or above: No action needed, pain <4.

## 2017-10-11 NOTE — Progress Notes (Signed)
Extravasation of 50 cc of saline in left forearm, 20 g placed by IV team.  IV removed and Ice applied.

## 2017-10-12 ENCOUNTER — Telehealth: Payer: Self-pay | Admitting: Hematology

## 2017-10-12 ENCOUNTER — Encounter: Payer: Self-pay | Admitting: Hematology

## 2017-10-12 ENCOUNTER — Other Ambulatory Visit: Payer: Self-pay | Admitting: Hematology

## 2017-10-12 DIAGNOSIS — C801 Malignant (primary) neoplasm, unspecified: Principal | ICD-10-CM

## 2017-10-12 DIAGNOSIS — C7989 Secondary malignant neoplasm of other specified sites: Secondary | ICD-10-CM

## 2017-10-12 MED ORDER — PREDNISONE 10 MG PO TABS: 40.0000 mg | ORAL_TABLET | Freq: Three times a day (TID) | ORAL | 0 refills | Status: DC

## 2017-10-12 NOTE — Telephone Encounter (Signed)
I called pt and reviewed her CT findings with her today.  This is consistent with primary lung cancer.  Unfortunately, her CT scan was done without contrast, due to the difficulty to access her vein.  I will ask my colleague Dr. Julien Nordmann to present her case in thoracic tumor conference tomorrow morning.  She is scheduled for brain MRI in 2 days, if she has brain metastasis, will order dexamethasone for her and refer her to rad/onc urgently. I called in prednisone 71m daily for her today, per her request, for her SOB (she has been on it ordered by her pulmonologist).  She may benefit from palliative radiation to thoracic also, will refer to rad/onc. I will request PD-L1 and FO on her biopsy. I will see her back next Monday.   YTruitt Merle 10/12/2017

## 2017-10-12 NOTE — Telephone Encounter (Signed)
Called pt re appt that was added per 7/24 sch msg- spoke w/ pt re appts.

## 2017-10-13 ENCOUNTER — Telehealth: Payer: Self-pay

## 2017-10-13 ENCOUNTER — Ambulatory Visit (HOSPITAL_COMMUNITY): Payer: 59

## 2017-10-13 ENCOUNTER — Encounter: Payer: Self-pay | Admitting: Radiation Oncology

## 2017-10-13 ENCOUNTER — Other Ambulatory Visit: Payer: Self-pay | Admitting: *Deleted

## 2017-10-13 DIAGNOSIS — C7989 Secondary malignant neoplasm of other specified sites: Secondary | ICD-10-CM

## 2017-10-13 DIAGNOSIS — C801 Malignant (primary) neoplasm, unspecified: Principal | ICD-10-CM

## 2017-10-13 NOTE — Telephone Encounter (Signed)
Dr. Burr Medico notified pt.

## 2017-10-13 NOTE — Progress Notes (Signed)
Called patient to offer support and encouragement.  Patient did not know that colonoscopy had been cancelled. I also explained where she is to go for MRI of the brain on 7/26. Patient lives with her mother, has transportation and insurance. No barriers to treatment identified. Patient states that "I am doing good." She draws support from her faith. Patient has my contact information and verbalized understanding that she can call with questions or concerns.  Social Work referral placed for emotional support and to offer support group information.

## 2017-10-13 NOTE — Telephone Encounter (Signed)
----- Message from Truitt Merle, MD sent at 10/12/2017  4:52 PM EDT ----- Regarding: RE: referral  Yes, I told her, thanks   ----- Message ----- From: Algernon Huxley, RN Sent: 10/12/2017   4:28 PM To: Milus Banister, MD, Timothy Lasso, RN, # Subject: RE: referral                                   Does the patient know that this is being cancelled? ----- Message ----- From: Truitt Merle, MD Sent: 10/12/2017   4:14 PM To: Milus Banister, MD, Timothy Lasso, RN, # Subject: RE: referral                                   [name],  Her CT showed a large mass in right lung, this is likely lung primary, I do not think she needs colonoscopy, OK to cancel.   Thanks much for your help,  Krista Blue  ----- Message ----- From: Doran Stabler, MD Sent: 10/09/2017   8:06 AM To: Milus Banister, MD, Timothy Lasso, RN, # Subject: RE: referral                                   If colonoscopy needed, I could start [place] at 730 on 7/23. - HD ----- Message ----- From: Milus Banister, MD Sent: 10/05/2017   5:13 PM To: Timothy Lasso, RN, Truitt Merle, MD, # Subject: RE: referral                                   Yvonne Lang is out of town this week and I am out of town starting tomorrow PM and all next week.  See below however.  Patty, Can you please show this to whomever is our DOD Thursday morning to help with this.  Thanks  DJ  ----- Message ----- From: Truitt Merle, MD Sent: 10/05/2017   5:03 PM To: Milus Banister, MD, Doran Stabler, MD Subject: Yvonne Lang: referral                                   Dr. Loletha Carrow, she is scheduled to see you in Sep. This lady needs to be see ASAP for GI work up of metastatic adeno with unknown primary. Could you see her and do the scope next week? I sent a message to Yvonne Lang earlier today about this.   Thanks much,  Krista Blue  ----- Message ----- From: Truitt Merle, MD Sent: 10/05/2017  12:46 PM To: Milus Banister, MD, Nicholaus Corolla Subject: RE: referral                                    Referral order is in now.   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 Could you or your partner see her and do endoscopy as soon as possible? I ordered a PET scan to be done ASAP also  Thanks much,  yan  ----- Message ----- From: Nicholaus Corolla Sent: 10/05/2017  10:57 AM To: Truitt Merle, MD, Nicholaus Corolla Subject: referral                                       Per 7/16 los _ PET and brain MRI ASAP [facility] referral   Please place referral - if still needed.  I did not see a referral placed for [place]   Thanks (:

## 2017-10-14 ENCOUNTER — Encounter (HOSPITAL_COMMUNITY): Payer: 59

## 2017-10-14 ENCOUNTER — Ambulatory Visit (HOSPITAL_COMMUNITY)
Admission: RE | Admit: 2017-10-14 | Discharge: 2017-10-14 | Disposition: A | Discharge status: Home / Self Care | Payer: 59 | Source: Ambulatory Visit | Attending: Hematology | Admitting: Hematology

## 2017-10-14 ENCOUNTER — Ambulatory Visit (HOSPITAL_COMMUNITY): Payer: 59

## 2017-10-14 DIAGNOSIS — R1013 Epigastric pain: Secondary | ICD-10-CM | POA: Insufficient documentation

## 2017-10-14 DIAGNOSIS — G8929 Other chronic pain: Secondary | ICD-10-CM | POA: Diagnosis present

## 2017-10-14 DIAGNOSIS — M5031 Other cervical disc degeneration,  high cervical region: Secondary | ICD-10-CM | POA: Insufficient documentation

## 2017-10-14 MED ORDER — GADOBENATE DIMEGLUMINE 529 MG/ML IV SOLN
20.0000 mL | Freq: Once | INTRAVENOUS | Status: AC | PRN
  Administered 2017-10-14: 18 mL via INTRAVENOUS

## 2017-10-17 ENCOUNTER — Ambulatory Visit: Payer: 59 | Admitting: Hematology

## 2017-10-17 ENCOUNTER — Encounter: Payer: Self-pay | Admitting: Hematology

## 2017-10-17 ENCOUNTER — Encounter: Payer: Self-pay | Admitting: *Deleted

## 2017-10-17 ENCOUNTER — Inpatient Hospital Stay (HOSPITAL_BASED_OUTPATIENT_CLINIC_OR_DEPARTMENT_OTHER): Payer: 59 | Admitting: Hematology

## 2017-10-17 VITALS — BP 119/54 | HR 118 | Temp 97.7°F | Resp 18 | Ht 66.5 in | Wt 193.4 lb

## 2017-10-17 DIAGNOSIS — F419 Anxiety disorder, unspecified: Secondary | ICD-10-CM

## 2017-10-17 DIAGNOSIS — M5417 Radiculopathy, lumbosacral region: Secondary | ICD-10-CM

## 2017-10-17 DIAGNOSIS — Z79899 Other long term (current) drug therapy: Secondary | ICD-10-CM

## 2017-10-17 DIAGNOSIS — C3431 Malignant neoplasm of lower lobe, right bronchus or lung: Secondary | ICD-10-CM

## 2017-10-17 DIAGNOSIS — C3490 Malignant neoplasm of unspecified part of unspecified bronchus or lung: Secondary | ICD-10-CM

## 2017-10-17 DIAGNOSIS — G8929 Other chronic pain: Secondary | ICD-10-CM

## 2017-10-17 DIAGNOSIS — C7989 Secondary malignant neoplasm of other specified sites: Secondary | ICD-10-CM | POA: Diagnosis not present

## 2017-10-17 DIAGNOSIS — F3181 Bipolar II disorder: Secondary | ICD-10-CM

## 2017-10-17 DIAGNOSIS — Z7982 Long term (current) use of aspirin: Secondary | ICD-10-CM

## 2017-10-17 DIAGNOSIS — R110 Nausea: Secondary | ICD-10-CM

## 2017-10-17 DIAGNOSIS — R51 Headache: Secondary | ICD-10-CM

## 2017-10-17 DIAGNOSIS — Z9884 Bariatric surgery status: Secondary | ICD-10-CM

## 2017-10-17 DIAGNOSIS — K219 Gastro-esophageal reflux disease without esophagitis: Secondary | ICD-10-CM

## 2017-10-17 DIAGNOSIS — F17210 Nicotine dependence, cigarettes, uncomplicated: Secondary | ICD-10-CM

## 2017-10-17 DIAGNOSIS — Z800 Family history of malignant neoplasm of digestive organs: Secondary | ICD-10-CM

## 2017-10-17 DIAGNOSIS — Z808 Family history of malignant neoplasm of other organs or systems: Secondary | ICD-10-CM

## 2017-10-17 DIAGNOSIS — R0602 Shortness of breath: Secondary | ICD-10-CM

## 2017-10-17 DIAGNOSIS — Z8249 Family history of ischemic heart disease and other diseases of the circulatory system: Secondary | ICD-10-CM

## 2017-10-17 MED ORDER — PROCHLORPERAZINE MALEATE 10 MG PO TABS: 10.0000 mg | ORAL_TABLET | Freq: Four times a day (QID) | ORAL | 0 refills | Status: DC | PRN

## 2017-10-17 MED ORDER — BUTALBITAL-APAP-CAFFEINE 50-325-40 MG PO TABS: 1.0000 | ORAL_TABLET | Freq: Four times a day (QID) | ORAL | 0 refills | Status: DC | PRN

## 2017-10-17 MED ORDER — MIRTAZAPINE 7.5 MG PO TABS: 7.5000 mg | ORAL_TABLET | Freq: Every day | ORAL | 0 refills | Status: DC

## 2017-10-17 MED ORDER — ONDANSETRON HCL 8 MG PO TABS: 8.0000 mg | ORAL_TABLET | Freq: Three times a day (TID) | ORAL | 1 refills | Status: DC | PRN

## 2017-10-17 MED ORDER — PREDNISONE 10 MG PO TABS: 40.0000 mg | ORAL_TABLET | Freq: Every day | ORAL | 0 refills | Status: DC

## 2017-10-17 NOTE — Progress Notes (Signed)
[name]  [phone] [phone]  Clinic Follow Up Note   Patient Care Team: Redmond School, MD as PCP - General (Internal Medicine)   Date of Service:  10/17/2017  CHIEF COMPLAINTS/PURPOSE OF CONSULTATION:  F/u for metastatic adenocarcinoma, probable lung primary   Oncology History   Cancer Staging Metastatic non-small cell lung cancer [facility]) Staging form: Lung, AJCC 8th Edition - Clinical stage from 10/17/2017: Stage IV (cT3, cN1, cM1b) - Signed by Truitt Merle, MD on 10/17/2017       Metastatic non-small cell lung cancer ([place])   09/20/2017 Imaging    US Breast Left 09/20/17  IMPRESSION Indeterminte palpable mass measuring 3.2x1.7x2.8 cm  inferior medial to the inframammary fold of the left breast.        09/20/2017 Initial Biopsy    Diagnosis 09/20/17 Soft Tissue Needle Core Biopsy, inferior medial to IMF - ADENOCARCINOMA. - SEE MICROSCOPIC DESCRIPTION.  Microscopic Comment Immunohistochemistry will be performed and reported as an addendum. (JDP:ah 09/23/17) ADDENDUM: Immunohistochemistry shows the tumor is strongly positive with cytokeratin AE1/AE3, cytokeratin 7 and shows moderate weak positivity with CDX-2. The tumor is negative with estrogen receptor, progesterone receptor, GCDFP, GATA-3, Napsin A, TTF-1, WT-1 and cytokeratin 20. The immunophenotype is consistent with metastatic carcinoma. The combination of CDX-2 and cytokeratin 7 positivity raises the possibility of an upper gastrointestinal primary. Clinical correlation is essential.      10/06/2017 Initial Diagnosis    Metastatic adenocarcinoma involving soft tissue with unknown primary site [facility])      10/10/2017 Procedure    Upper Endoscopy by Dr. Silverio Decamp 10/10/17  IMPRESSION - Normal esophagus. - Z-line regular, 35 cm from the incisors. - Gastric bypass with a normal-sized pouch and intact staple line. Gastrojejunal anastomosis characterized by healthy appearing mucosa. - No  specimens collected.      10/12/2017 Imaging    CT CAP WO Contrast 10/12/17  IMPRESSION: 7 cm central right lung mass involving the right hilum, with postobstructive collapse of the right middle lobe, highly suspicious for primary bronchogenic carcinoma. 5 cm masslike opacity in the superior segment of the right lower lobe may represent carcinoma or postobstructive pneumonitis. 3.3 cm soft tissue mass in the lower anterior chest wall soft tissues, suspicious for metastatic disease. No evidence of abdominal or pelvic metastatic disease.       10/14/2017 Imaging    MRI Brain 10/14/17  IMPRESSION: 1. No metastatic disease or acute intracranial abnormality. Normal MRI appearance of the brain. 2. 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        10/17/2017 Cancer Staging    Staging form: Lung, AJCC 8th Edition - Clinical stage from 10/17/2017: Stage IV (cT3, cN1, cM1b) - Signed by Truitt Merle, MD on 10/17/2017       HISTORY OF PRESENTING ILLNESS (10/04/2017):  Yvonne Lang 52 y.o. female who was referred to me by her PCP Dr. Gerarda Fraction. She is here today for newly diagnosed metastatic adenocarcinoma.  She is accompanied by her sister and her mother to my clinic today.    She initially presented with a palpable mass inferior medical to the inframammary fold of the left breast about 3 months ago,  which was recently biopsied and revealed metastatic adenocarcinoma, favor upper GI origin.  She noticed the breast knot 3 months ago and it was a size of a nickel initially, and it has increased in size over the past 3 months.  She was seen by a primary care physician who referred her mammogram, which  reviewed no mass in the breasts, and biopsy of the chest wall mass was performed.  She has a gastric bypass in early 2000s and is having GERD that are increasing in severity now. She uses sucralfate and omeprazole more frequently. It is associated with sharp knife-like epigastric pain that is  getting worse and more frequent now and lasts 10 minutes and comes at least once a week. She sometimes gets dysphagia, but it's neither constant nor significant. No other abdominal pain. She also has new onset headaches in the frontal area that is constant and are 6-7/10 for the past 2 to 3 weeks.  She never had these headaches before and she uses Tylenol that provides minimal relief.    For pertinent past medical history, she also had 10-12 inches removed from her colon. No melena or hematochezia. No hematuria. No changes in bowel habits. She's been having back pains that are getting worse. She takes Tylenol for it and it helps. She reports history of back surgery. She was also diagnosed with COPD, depression, and anxiety.   She was on 3 months of progesterone and estriol for hot flashes and stopped  When she found about the new breast mass 3 weeks ago.   Her sister died of skin cancer. Her uncle had colon cancer at age 86.  She lives with her mother. No children. No alcohol. She chews a nicotine gum as she's trying to quit smoking. She's been smoking since age 8.     INTERVAL HISTORY  Yvonne Lang is here for a follow up and discuss treatment options. She presents to the clinic today accompanied by mother and sister.    She notes she still has headaches which are blinding. She has not been eating adequately. She denies having chronic headaches before. She is taking Fioricet She has not been taking tramadol for headaches but does help her back pain. So she only treats one once pain at a time. She has not been taking Xanax for stress and overwhelming symptoms but is willing to start. She denies feeling depressing.  She also notes she was nauseous and dry heaving today. She notes she drinks coffee some but still able to sleep at night.  She notes her breathing has improved with prednisone. She notes her chest wall nodule is growing.  She would like FMLA paperwork and diagnosis for her job as  she will not be able to work currently.  Her mother notes the patient's youngest sister passed from rare skin cancer. Patient lives with her mother.      MEDICAL HISTORY:  Past Medical History:  Diagnosis Date  . Anxiety   . Bronchitis   . Cancer [facility]) June/July 2019   Mass below left breast.     . COPD (chronic obstructive pulmonary disease) ([place])   . Depression   . GERD (gastroesophageal reflux disease)   . H/O gastric bypass    1999  . Irritable bowel syndrome     SURGICAL HISTORY: Past Surgical History:  Procedure Laterality Date  . BACK SURGERY    . CHOLECYSTECTOMY    . COLON SURGERY     for a kink in her colon and 12 inchs removed  . COLONOSCOPY N/A 12/13/2013   Procedure: COLONOSCOPY;  Surgeon: Rogene Houston, MD;  Location: [facility];  Service: Endoscopy;  Laterality: N/A;  200  . ESOPHAGOGASTRODUODENOSCOPY N/A 12/13/2013   Procedure: ESOPHAGOGASTRODUODENOSCOPY (EGD);  Surgeon: Rogene Houston, MD;  Location: [facility] ENDO SUITE;  Service: Endoscopy;  Laterality: N/A;  . Gastric Bypas  2000    SOCIAL HISTORY: Social History   Socioeconomic History  . Marital status: Single    Spouse name: Not on file  . Number of children: Not on file  . Years of education: Not on file  . Highest education level: Not on file  Occupational History  . Not on file  Social Needs  . Financial resource strain: Not on file  . Food insecurity:    Worry: Not on file    Inability: Not on file  . Transportation needs:    Medical: Not on file    Non-medical: Not on file  Tobacco Use  . Smoking status: Current Every Day Smoker    Packs/day: 1.00    Years: 40.00    Pack years: 40.00    Types: Cigarettes  . Smokeless tobacco: Never Used  . Tobacco comment: pack a day 30 yrs  Substance and Sexual Activity  . Alcohol use: No  . Drug use: No  . Sexual activity: Not Currently    Birth control/protection: Post-menopausal  Lifestyle  . Physical activity:    Days per week: Not on  file    Minutes per session: Not on file  . Stress: Not on file  Relationships  . Social connections:    Talks on phone: Not on file    Gets together: Not on file    Attends religious service: Not on file    Active member of club or organization: Not on file    Attends meetings of clubs or organizations: Not on file    Relationship status: Not on file  . Intimate partner violence:    Fear of current or ex partner: Not on file    Emotionally abused: Not on file    Physically abused: Not on file    Forced sexual activity: Not on file  Other Topics Concern  . Not on file  Social History Narrative  . Not on file    FAMILY HISTORY: Family History  Problem Relation Age of Onset  . Other Mother        has a pacemaker; rectal prolapse; rods in legs; lung fibrosis   . Other Father        heart gave out  . Hypertension Sister   . Bronchitis Sister   . Heart attack Paternal Grandmother   . COPD Maternal Grandmother   . Emphysema Maternal Grandmother   . Alcoholism Maternal Grandfather   . Cancer Sister        rare skin cancer  . Cancer Maternal Uncle 7       colon cancer   . Colon cancer Maternal Uncle     ALLERGIES:  is allergic to codeine.  MEDICATIONS:  Current Outpatient Medications  Medication Sig Dispense Refill  . albuterol (PROVENTIL HFA) 108 (90 Base) MCG/ACT inhaler Inhale 2 puffs into the lungs 2 (two) times daily.     Marland Kitchen albuterol (PROVENTIL) 4 MG tablet Take 4 mg by mouth 2 (two) times daily.     . Ascorbic Acid (VITAMIN C) 100 MG tablet Take 100 mg by mouth daily.    Marland Kitchen aspirin 81 MG tablet Take 81 mg by mouth daily.    . butalbital-acetaminophen-caffeine (FIORICET, ESGIC) 50-325-40 MG tablet Take 1-2 tablets by mouth every 6 (six) hours as needed for headache. 40 tablet 0  . CALCIUM PO Take by mouth daily.    Marland Kitchen dicyclomine (BENTYL) 20 MG tablet Take 1 tablet (20 mg total)  by mouth every 6 (six) hours. 60 tablet 1  . DULoxetine (CYMBALTA) 60 MG capsule Take 60  mg by mouth daily.    Marland Kitchen estradiol (ESTRACE) 2 MG tablet Take 1 tablet (2 mg total) by mouth daily. 30 tablet 6  . gabapentin (NEURONTIN) 300 MG capsule TAKE 1 CAPSULE BY MOUTH 4 TIMES DAILY  4  . methocarbamol (ROBAXIN) 750 MG tablet Take 750 mg by mouth 4 (four) times daily.    . Multiple Vitamin (MULTIVITAMIN) tablet Take 1 tablet by mouth daily.    . Omega-3 Fatty Acids (FISH OIL) 600 MG CAPS Take 600 mg by mouth daily.     Marland Kitchen omeprazole (PRILOSEC) 20 MG capsule Take 1 capsule (20 mg total) by mouth 2 (two) times daily before a meal. 60 capsule 0  . potassium chloride SA (K-DUR,KLOR-CON) 20 MEQ tablet Take 1 tablet (20 mEq total) by mouth 2 (two) times daily. 60 tablet 0  . predniSONE (DELTASONE) 10 MG tablet Take 4 tablets (40 mg total) by mouth daily with breakfast. 40 tablet 0  . Probiotic Product (PROBIOTIC-10 PO) Take by mouth.    . progesterone (PROMETRIUM) 200 MG capsule Take 1 daily at HS 30 capsule 6  . RaNITidine HCl (ZANTAC PO) Take by mouth 3 (three) times daily.    . sucralfate (CARAFATE) 1 g tablet Take 1 tablet (1 g total) by mouth 4 (four) times daily. 120 tablet 1  . traMADol (ULTRAM) 50 MG tablet Take 1 tablet (50 mg total) by mouth every 6 (six) hours as needed. 90 tablet 0  . mirtazapine (REMERON) 7.5 MG tablet Take 1 tablet (7.5 mg total) by mouth at bedtime. 30 tablet 0  . ondansetron (ZOFRAN) 8 MG tablet Take 1 tablet (8 mg total) by mouth every 8 (eight) hours as needed for nausea or vomiting. 30 tablet 1  . prochlorperazine (COMPAZINE) 10 MG tablet Take 1 tablet (10 mg total) by mouth every 6 (six) hours as needed for nausea or vomiting. 30 tablet 0  . UNABLE TO FIND Inhale into the lungs. Med Name: Trilogy as needed     No current facility-administered medications for this visit.     REVIEW OF SYSTEMS:  Constitutional: Denies fevers, chills or abnormal night sweats (+) frontal headaches, stable Eyes: Denies blurriness of vision, double vision or watery eyes Ears,  nose, mouth, throat, and face: Denies mucositis or sore throat Respiratory: Denies cough, (+) SOB and Wheezing, improved (+) chest wall nodule Cardiovascular: Denies palpitation, chest discomfort or lower extremity swelling Gastrointestinal:  Denies heartburn or change in bowel habits (+) history of gastric bypass (+) nausea Skin: Denies abnormal skin rashes Lymphatics: Denies new lymphadenopathy or easy bruising Neurological:Denies numbness, tingling or new weaknesses Behavioral/Psych: Mood is stable, no new changes (+) history of depression and anxiety, only feels stressed now  All other systems were reviewed with the patient and are negative.  PHYSICAL EXAMINATION: ECOG PERFORMANCE STATUS: 2  Vitals:   10/17/17 1602  BP: (!) 119/54  Pulse: (!) 118  Resp: 18  Temp: 97.7 F (36.5 C)  SpO2: 96%   Filed Weights   10/17/17 1602  Weight: 193 lb 6.4 oz (87.7 kg)    GENERAL:alert, no distress and comfortable (+) chewing nicotine gum SKIN: skin color, texture, turgor are normal, no rashes or significant lesions EYES: normal, conjunctiva are pink and non-injected, sclera clear OROPHARYNX:no exudate, no erythema and lips, buccal mucosa, and tongue normal  NECK: supple, thyroid normal size, non-tender, without nodularity LYMPH:  no palpable lymphadenopathy in the cervical, axillary or inguinal LUNGS: clear to percussion (+) bilateral wheezing (+) SOB HEART: regular rate & rhythm and no murmurs and no lower extremity edema ABDOMEN:abdomen soft, non-tender and normal bowel sounds Musculoskeletal:no cyanosis of digits and no clubbing  PSYCH: alert & oriented x 3 with fluent speech NEURO: no focal motor/sensory deficits Breast: (+) 3x4 cm low middle percutaneous chest  mass  LABORATORY DATA:  I have reviewed the data as listed CBC Latest Ref Rng & Units 10/04/2017 10/25/2013  WBC 3.9 - 10.3 K/uL 19.0(H) 8.8  Hemoglobin 11.6 - 15.9 g/dL 12.2 12.1  Hematocrit 34.8 - 46.6 % 38.1 36.3    Platelets 145 - 400 K/uL 538(H) 361   CMP Latest Ref Rng & Units 10/04/2017 10/25/2013  Glucose 70 - 99 mg/dL 130(H) 120(H)  BUN 6 - 20 mg/dL 6 4(L)  Creatinine 0.44 - 1.00 mg/dL 0.78 0.59  Sodium 135 - 145 mmol/L 140 144  Potassium 3.5 - 5.1 mmol/L 2.8(LL) 4.0  Chloride 98 - 111 mmol/L 104 107  CO2 22 - 32 mmol/L 27 28  Calcium 8.9 - 10.3 mg/dL 8.4(L) 9.0  Total Protein 6.5 - 8.1 g/dL 6.2(L) 6.1  Total Bilirubin 0.3 - 1.2 mg/dL <0.2(L) 0.2  Alkaline Phos 38 - 126 U/L 187(H) 82  AST 15 - 41 U/L 31 18  ALT 0 - 44 U/L 100(H) 20     Pathology  09/20/2017 Breast Biopsy Diagnosis Soft Tissue Needle Core Biopsy, inferior medial to IMF - ADENOCARCINOMA. - SEE MICROSCOPIC DESCRIPTION. Microscopic Comment Immunohistochemistry will be performed and reported as an addendum. (JDP:ah 09/23/17) ADDENDUM: Immunohistochemistry shows the tumor is strongly positive with cytokeratin AE1/AE3, cytokeratin 7 and shows moderate weak positivity with CDX-2. The tumor is negative with estrogen receptor, progesterone receptor, GCDFP, GATA-3, Napsin A, TTF-1, WT-1 and cytokeratin 20. The immunophenotype is consistent with metastatic carcinoma. The combination of CDX-2 and cytokeratin 7 positivity raises the possibility of an upper gastrointestinal primary. Clinical correlation is essential.ical Information Specimen(s) Obtained: Soft Tissue Needle Core Biopsy, inferior medial to IMF Specimen Clinical Information not definitely in the breast palpable mass x 1 month; ? lymph node r/o malignancy Gross Received in two containers (one with formalin, one with saline) are three cores of gray-white to dark soft firm tissue (one core in saline, two in formalin) which range from 1 x 0.1 cm to 1.3 x 0.1 cm. The core from saline is submitted in RPMI for possible flow cytometry, and two cores from formalin are submitted in one block for routine histology.     PROCEDURES  Upper Endoscopy by Dr. Silverio Decamp 10/10/17   IMPRESSION - Normal esophagus. - Z-line regular, 35 cm from the incisors. - Gastric bypass with a normal-sized pouch and intact staple line. Gastrojejunal anastomosis characterized by healthy appearing mucosa. - No specimens collected.   RADIOGRAPHIC STUDIES: I have personally reviewed the radiological images as listed and agreed with the findings in the report. Ct Abdomen Pelvis Wo Contrast  Result Date: 10/12/2017 CLINICAL DATA:  Metastatic adenocarcinoma of unknown primary. Epigastric pain, unintentional weight loss, nonproductive cough and shortness of breath. EXAM: CT CHEST, ABDOMEN AND PELVIS WITHOUT CONTRAST TECHNIQUE: Multidetector CT imaging of the chest, abdomen and pelvis was performed following the standard protocol without IV contrast. COMPARISON:  [facility] CT on 03/01/2014; no prior chest CT FINDINGS: CT CHEST FINDINGS Cardiovascular: No acute findings. Mediastinum/Lymph Nodes: Large mass involving the right hilum, as described below. No pathologically enlarged mediastinal or left hilar lymph nodes  seen on this unenhanced exam. Lungs/Pleura: A large soft tissue mass is seen involving the right hilum which obstructs the right middle lobe bronchus. This measures 6.8 x 6.6 cm. There is also encasement and narrowing of the central right upper lobe bronchus and bronchus intermedius.Postobstructive collapse of right middle lobe is seen. Masslike opacity in the superior segment of the right lower lobe measuring 5.5 x 3.7 cm may also represent neoplasm or postobstructive pneumonitis. Patchy airspace disease in peripheral right upper lobe is consistent with postobstructive pneumonitis. No evidence of pleural effusion. Musculoskeletal: No suspicious bone lesions identified. A soft tissue mass is seen in the lower anterior chest wall measuring 3.3 x 2.4 cm on image 40/2. CT ABDOMEN AND PELVIS FINDINGS Hepatobiliary: No masses visualized on this unenhanced exam. Prior cholecystectomy. No evidence of  biliary obstruction. Pancreas: No mass or inflammatory changes identified on this unenhanced exam. Spleen:  Within normal limits in size. Adrenals/Urinary Tract: No evidence of adrenal mass. No evidence of urolithiasis or hydronephrosis. Unremarkable appearance of bladder. Stomach/Bowel: Previous gastric bypass surgery noted. No evidence of obstruction, inflammatory process, or abnormal fluid collections. Vascular/Lymphatic: No pathologically enlarged lymph nodes identified. No abdominal aortic aneurysm. Reproductive:  No masses or other significant abnormality. Other:  None. Musculoskeletal:    No suspicious bone lesions identified. IMPRESSION: 7 cm central right lung mass involving the right hilum, with postobstructive collapse of the right middle lobe, highly suspicious for primary bronchogenic carcinoma. 5 cm masslike opacity in the superior segment of the right lower lobe may represent carcinoma or postobstructive pneumonitis. 3.3 cm soft tissue mass in the lower anterior chest wall soft tissues, suspicious for metastatic disease. No evidence of abdominal or pelvic metastatic disease. Electronically Signed   By: Earle Gell M.D.   On: 10/12/2017 10:42   Ct Chest Wo Contrast  Result Date: 10/12/2017 CLINICAL DATA:  Metastatic adenocarcinoma of unknown primary. Epigastric pain, unintentional weight loss, nonproductive cough and shortness of breath. EXAM: CT CHEST, ABDOMEN AND PELVIS WITHOUT CONTRAST TECHNIQUE: Multidetector CT imaging of the chest, abdomen and pelvis was performed following the standard protocol without IV contrast. COMPARISON:  [facility] CT on 03/01/2014; no prior chest CT FINDINGS: CT CHEST FINDINGS Cardiovascular: No acute findings. Mediastinum/Lymph Nodes: Large mass involving the right hilum, as described below. No pathologically enlarged mediastinal or left hilar lymph nodes seen on this unenhanced exam. Lungs/Pleura: A large soft tissue mass is seen involving the right hilum which obstructs  the right middle lobe bronchus. This measures 6.8 x 6.6 cm. There is also encasement and narrowing of the central right upper lobe bronchus and bronchus intermedius.Postobstructive collapse of right middle lobe is seen. Masslike opacity in the superior segment of the right lower lobe measuring 5.5 x 3.7 cm may also represent neoplasm or postobstructive pneumonitis. Patchy airspace disease in peripheral right upper lobe is consistent with postobstructive pneumonitis. No evidence of pleural effusion. Musculoskeletal: No suspicious bone lesions identified. A soft tissue mass is seen in the lower anterior chest wall measuring 3.3 x 2.4 cm on image 40/2. CT ABDOMEN AND PELVIS FINDINGS Hepatobiliary: No masses visualized on this unenhanced exam. Prior cholecystectomy. No evidence of biliary obstruction. Pancreas: No mass or inflammatory changes identified on this unenhanced exam. Spleen:  Within normal limits in size. Adrenals/Urinary Tract: No evidence of adrenal mass. No evidence of urolithiasis or hydronephrosis. Unremarkable appearance of bladder. Stomach/Bowel: Previous gastric bypass surgery noted. No evidence of obstruction, inflammatory process, or abnormal fluid collections. Vascular/Lymphatic: No pathologically enlarged lymph nodes identified. No  abdominal aortic aneurysm. Reproductive:  No masses or other significant abnormality. Other:  None. Musculoskeletal:    No suspicious bone lesions identified. IMPRESSION: 7 cm central right lung mass involving the right hilum, with postobstructive collapse of the right middle lobe, highly suspicious for primary bronchogenic carcinoma. 5 cm masslike opacity in the superior segment of the right lower lobe may represent carcinoma or postobstructive pneumonitis. 3.3 cm soft tissue mass in the lower anterior chest wall soft tissues, suspicious for metastatic disease. No evidence of abdominal or pelvic metastatic disease. Electronically Signed   By: Earle Gell M.D.   On:  10/12/2017 10:42   Mr Jeri Cos RV Contrast  Result Date: 10/14/2017 CLINICAL DATA:  52 year old female with metastatic adenocarcinoma, primary currently unknown. Staging. EXAM: MRI HEAD WITHOUT AND WITH CONTRAST TECHNIQUE: Multiplanar, multiecho pulse sequences of the brain and surrounding structures were obtained without and with intravenous contrast. CONTRAST:  73m MULTIHANCE GADOBENATE DIMEGLUMINE 529 MG/ML IV SOLN [facility]  [facility] 10/08/2017. FINDINGS: Brain: No abnormal enhancement identified. No midline shift, mass effect, or evidence of intracranial mass lesion. No dural thickening. No restricted diffusion to suggest acute infarction. No ventriculomegaly, extra-axial collection or acute intracranial hemorrhage. Cervicomedullary junction and pituitary are within normal limits. GPearline Cablesand white matter signal is within normal limits throughout the brain. No encephalomalacia or chronic cerebral blood products identified. Vascular: Major intracranial vascular flow voids are preserved. The major dural venous sinuses are enhancing and appear patent. Skull and upper cervical spine: Chronic C3-C4 disc and endplate degeneration. Visible bone marrow signal is within normal limits. Sinuses/Orbits: Normal orbits soft tissues. Mild to moderate left sphenoid sinus mucosal thickening is stable, other paranasal sinuses remain clear. Other: Visible internal auditory structures appear normal. Mastoid air cells are clear. Scalp and face soft tissues appear negative. IMPRESSION: 1. No metastatic disease or acute intracranial abnormality. Normal MRI appearance of the brain. 2. 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 Electronically Signed   By: HGenevie AnnM.D.   On: 10/14/2017 13:48   [facility] Uni Left Inc Axilla  Result Date: 09/20/2017 CLINICAL DATA:  52year old female with a palpable abnormality in the far lower inner left breast. EXAM: DIGITAL DIAGNOSTIC  UNILATERAL LEFT MAMMOGRAM WITH CAD AND TOMO LEFT BREAST ULTRASOUND COMPARISON:  Previous exam(s). ACR Breast Density Category b: There are scattered areas of fibroglandular density. FINDINGS: No suspicious masses or calcifications are seen in the left breast. Spot compression tangential tomograms g were performed over the palpable area of concern in the far lower inner left breast with no definite abnormalities identified. Mammographic images were processed with CAD. Physical examination at site of palpable concern reveals a firm palpable mass along the inferior medial margin of the inframammary fold. Targeted ultrasound at site palpable concern inferior medial to the inframammary fold of the left breast was performed. There is an oval circumscribed hypoechoic mass with lobulated margins measuring 3.2 x 1.7 x 2.8 cm. No lymphadenopathy seen in the left axilla. IMPRESSION: Indeterminate palpable mass just inferior medial to the inframammary fold of the left breast. RECOMMENDATION: Ultrasound-guided biopsy of the palpable mass inferior medial to the inframammary fold of the left breast is recommended. This will subsequently be performed and dictated separately. I have discussed the findings and recommendations with the patient. Results were also provided in writing at the conclusion of the visit. If applicable, a reminder letter will be sent to the patient regarding the next appointment. BI-RADS CATEGORY  4: Suspicious.  Electronically Signed   By: Everlean Alstrom M.D.   On: 09/20/2017 15:35   Mm Diag Breast Tomo Uni Left  Result Date: 09/20/2017 CLINICAL DATA:  52 year old female with a palpable abnormality in the far lower inner left breast. EXAM: DIGITAL DIAGNOSTIC UNILATERAL LEFT MAMMOGRAM WITH CAD AND TOMO LEFT BREAST ULTRASOUND COMPARISON:  Previous exam(s). ACR Breast Density Category b: There are scattered areas of fibroglandular density. FINDINGS: No suspicious masses or calcifications are seen in the left  breast. Spot compression tangential tomograms g were performed over the palpable area of concern in the far lower inner left breast with no definite abnormalities identified. Mammographic images were processed with CAD. Physical examination at site of palpable concern reveals a firm palpable mass along the inferior medial margin of the inframammary fold. Targeted ultrasound at site palpable concern inferior medial to the inframammary fold of the left breast was performed. There is an oval circumscribed hypoechoic mass with lobulated margins measuring 3.2 x 1.7 x 2.8 cm. No lymphadenopathy seen in the left axilla. IMPRESSION: Indeterminate palpable mass just inferior medial to the inframammary fold of the left breast. RECOMMENDATION: Ultrasound-guided biopsy of the palpable mass inferior medial to the inframammary fold of the left breast is recommended. This will subsequently be performed and dictated separately. I have discussed the findings and recommendations with the patient. Results were also provided in writing at the conclusion of the visit. If applicable, a reminder letter will be sent to the patient regarding the next appointment. BI-RADS CATEGORY  4: Suspicious. Electronically Signed   By: Everlean Alstrom M.D.   On: 09/20/2017 15:35   [place] Lt Breast Bx W Loc Dev 1st Lesion Img Bx Spec US Guide  Addendum Date: 09/27/2017   ADDENDUM REPORT: 09/27/2017 14:37 ADDENDUM: Pathology results: Pathology results from the ultrasound-guided biopsy of the palpable mass in the soft tissues of the midline chest/upper epigastric region just inferior medial to the left breast inframammary fold revealed adenocarcinoma, possibly of gastrointestinal origin. This is concordant with the imaging findings. The patient has been notified of the results. She is doing well and denies any biopsy site complications. These results were discussed with the patient's primary care physician, Dr. Gerarda Fraction, and he has already referred her to an  oncologist for further care. Annual routine screening mammography is recommended. The patient has been instructed to call the [facility] with any questions or concerns. Electronically Signed   By: Everlean Alstrom M.D.   On: 09/27/2017 14:37   Result Date: 09/27/2017 CLINICAL DATA:  52 year old female with a palpable mass inferior medial to the inframammary fold of the left breast. EXAM: ULTRASOUND GUIDED LEFT BREAST CORE NEEDLE BIOPSY COMPARISON:  Previous exam(s). FINDINGS: I met with the patient and we discussed the procedure of ultrasound-guided biopsy, including benefits and alternatives. We discussed the high likelihood of a successful procedure. We discussed the risks of the procedure, including infection, bleeding, tissue injury, clip migration, and inadequate sampling. Informed written consent was given. The usual time-out protocol was performed immediately prior to the procedure. Lesion quadrant: Lower inner Using sterile technique and 1% Lidocaine as local anesthetic, under direct ultrasound visualization, a 14 gauge spring-loaded device was used to perform biopsy of the palpable mass inferior medial to the inframammary fold of the left breast using a medial to lateral approach. At the conclusion of the procedure a HydroMARK tissue marker clip was deployed into the biopsy cavity. A post biopsy mammogram was not performed as this mass could not be included in  the field of view on diagnostic mammography due to far inferior medial location. IMPRESSION: Ultrasound guided biopsy of the palpable mass inferior medial to the inframammary fold of the left breast. No apparent complications. Electronically Signed: By: Everlean Alstrom M.D. On: 09/20/2017 16:13    ASSESSMENT & PLAN:  Yvonne Lang is a 52 y.o. female with history of bipolar II and lumbosacral radiculitis.   1. 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  -I previously reviewed her mammogram and her chest wall mas  biopsy results with patient and her family members.  -The biopsy of the chest wall mass inferior medial to left breast from 09/20/2017 revealed adenocarcinoma. The Immunohistochemistry shows the tumor is strongly positive with cytokeratin AE1/AE3, cytokeratin 7 and shows moderate weak positivity with CDX-2. The tumor is negative with estrogen receptor, progesterone receptor, GCDFP, GATA-3, Napsin A, TTF-1, WT-1 and cytokeratin 20. The immunophenotype is consistent with metastatic carcinoma, favor upper GI origin.  -She had a history of gastric bypass in the past, chronic GERD, and has worsening gastric pain and burning sensation lately, which requires high-dose antacid medications.  She underwent EGD which was negative for malignancy. -I reviewed her staging CT images and Brain MRI from 09/2017 with patient and her family in person.  She has a large right hilar/lower lobe mass, with obstruction to the right middle lobe.  This is consistent with primary lung cancer. Unfortunately, her CT scan was done without contrast, due to the difficulty to access her vein -I will order PET scan as her CT was done without contrast.  -Baseline CEA from 10/04/17 at elevated 38.09. -I discussed that she has Stage IV non-small cell lung cancer and the goal of care is no longer curative, but still treatable to control her disease, to improve her symptoms and prolong her life.  -I discussed the systemic therapy options, including IV chemo, Immunotherapy or target therapy, depends on her tumors genomic features. I have requested PD-L1 and FO on her biopsy to see if she is eligible for immunotherapy or target therapy.  She is a heavy smoker, so the possibility of EGFR mutation or ALK translocation is small. -I discussed in the meantime she may benefit from palliative radiation to thoracic to shrink the mass, and potentially reopen her right middle lobe, and help her breathing. I previously referred to rad/onc and she will consult with  Dr. Tammi Klippel on 8/1.  -If her PET scan is negative for other distant metastasis, and Dr. Tammi Klippel plans to give traditional 5 to 6 weeks of radiation, I would consider concurrent chemo with carbo and taxol  -Given her veins collapsed during CT IV contrast, I discussed the option of PAC placement for treatment. She is agreeable to port.  -I previously asked my colleague Dr. Julien Nordmann to present her case in thoracic tumor conference.  -I previously called in prednisone 63m daily for her, per her request, for her SOB. I refilled her for another 10 day today (10/17/17)   2. New onset frontal headaches   -She denies history of previous or chronic headaches, now presented with persistent headaches for the past 2 to 3 weeks. -10/14/17 Brain MRI shows no metastatic disease but shows C3-C4 disc and endplate degeneration.  -Tramadol doe not effect her headaches but she is taking Fioricet with some relief. I refilled today (10/17/17) -I will refer her to Dr. VMickeal Skinnerfor further workup. She is agreeable.   3. Anxiety and Depression - I previously prescribed alprazolam (Xanax) and lorazepam  on 10/04/17 -She declines being depressed, but notes to being stressed.  -She is willing to start Xanax for her stress  4. GERD - I previously prescribed sucralfate and pantoprazole on 10/04/17  5. Chronic back pain  -pain stable  -On tramadol 50 mg q6hours, I refilled previously   6. Low appetite, Nausea  -I offered her the option of Mirtazapine for appetite stimulant which is also antidepressant. She is interested, I will prescribed today (10/17/17) -I will prescribed her antiemetics,  compazine and Zofran today as well   7. Support  -Pt is interested in patient advocates -I will refer her our financial office to work on financial assistance when needed.  -I disease the available assistance and resources of social work and dietician available to her.  -I provided her with letter of diagnosis and FMLA paperwork for her  short term disability.     Plan: -I refilled her Prednisone and Fioricet today  -I prescribed her mirtazapine, Zofran and compazine today  -PET scan as soon as possible to complete staging  -IR port placement next week  -I will discuss with Dr. Tammi Klippel to finalize her treatment plan (palliative radiation, versus concurrent chemoradiation), I will schedule her f/u appointment accordingly  -FO result pending, will likely return around 8/9     No orders of the defined types were placed in this encounter.   All questions were answered. The patient knows to call the clinic with any problems, questions or concerns. I spent 35 minutes counseling the patient face to face. The total time spent in the appointment was 45 minutes and more than 50% was on counseling.  Her sister and her mother had multiple questions, I answered to the best of my knowledge.  Oneal Deputy, am acting as scribe for Truitt Merle, MD.   I have reviewed the above documentation for accuracy and completeness, and I agree with the above.     Truitt Merle, MD 10/17/2017 5:34 PM

## 2017-10-17 NOTE — Progress Notes (Signed)
Oncology Nurse Navigator Documentation  Oncology Nurse Navigator Flowsheets 10/17/2017  Navigator Location [facility]  Navigator Encounter Type Other/I followed up with managed care dept regarding authorization for PET scan.    Barriers/Navigation Needs Coordination of Care  Interventions Coordination of Care  Referrals Other  Acuity Level 1  Time Spent with Patient 15

## 2017-10-18 ENCOUNTER — Encounter: Payer: Self-pay | Admitting: Hematology

## 2017-10-18 ENCOUNTER — Telehealth: Payer: Self-pay | Admitting: Hematology

## 2017-10-18 ENCOUNTER — Telehealth: Payer: Self-pay | Admitting: *Deleted

## 2017-10-18 ENCOUNTER — Ambulatory Visit: Payer: 59 | Admitting: Hematology

## 2017-10-18 ENCOUNTER — Encounter: Payer: Self-pay | Admitting: Radiation Oncology

## 2017-10-18 NOTE — Telephone Encounter (Signed)
Faxed ROI to [facility]; release [phone]

## 2017-10-18 NOTE — Telephone Encounter (Signed)
PET scan as soon as possible IR port placement next week per 7/30 los

## 2017-10-18 NOTE — Progress Notes (Signed)
Thoracic Location of Tumor / Histology: Stage IV metastatic non small cell lung cancer  Patient was referred to Dr. Burr Medico by Dr. Gerarda Fraction. She initially presented with a palpable mass inferior medial to the inframammary fold of her left breast. Biopsy revealed adenocarcinoma.     Tobacco/Marijuana/Snuff/ETOH use: yes  Past/Anticipated interventions by cardiothoracic surgery, if any: no  Past/Anticipated interventions by medical oncology, if any: PET scan, port placement,, referral to palliative vs concurrent chemoradiation  Signs/Symptoms  Weight changes, if any: no  Respiratory complaints, if any: Persistent dry cough. SOB with even minimal exertion. Denies pain or difficulty associated with swallowing.  Hemoptysis, if any: no  Pain issues, if any:  Chronic low back pain managed with tramadol 50 mg q6h. Reports she had back surgery several years ago. Reports managing headaches with fiorcet.  SAFETY ISSUES:  Prior radiation? no  Pacemaker/ICD? no   Possible current pregnancy?no  Is the patient on methotrexate? no  Current Complaints / other details:  52 year old female. Single. No children. Trying to stop smoking. Resides with her mother in [place]. Her sister died of skin cancer. Her maternal uncle had colon ca at age 5. Accompanied today by her mother and sister.

## 2017-10-19 ENCOUNTER — Encounter: Payer: 59 | Admitting: Gastroenterology

## 2017-10-19 ENCOUNTER — Encounter: Payer: Self-pay | Admitting: *Deleted

## 2017-10-20 ENCOUNTER — Encounter: Payer: Self-pay | Admitting: Hematology

## 2017-10-20 ENCOUNTER — Ambulatory Visit
Admission: RE | Admit: 2017-10-20 | Discharge: 2017-10-20 | Disposition: A | Discharge status: Home / Self Care | Payer: 59 | Source: Ambulatory Visit | Attending: Radiation Oncology | Admitting: Radiation Oncology

## 2017-10-20 ENCOUNTER — Encounter: Payer: Self-pay | Admitting: Radiation Oncology

## 2017-10-20 ENCOUNTER — Other Ambulatory Visit: Payer: Self-pay

## 2017-10-20 ENCOUNTER — Ambulatory Visit
Admission: RE | Admit: 2017-10-20 | Discharge: 2017-10-20 | Disposition: A | Discharge status: Home / Self Care | Payer: No Typology Code available for payment source | Source: Ambulatory Visit | Attending: Radiation Oncology | Admitting: Radiation Oncology

## 2017-10-20 VITALS — BP 109/85 | HR 84 | Temp 97.6°F | Resp 18 | Ht 67.0 in | Wt 193.4 lb

## 2017-10-20 DIAGNOSIS — C801 Malignant (primary) neoplasm, unspecified: Secondary | ICD-10-CM | POA: Diagnosis not present

## 2017-10-20 DIAGNOSIS — Z800 Family history of malignant neoplasm of digestive organs: Secondary | ICD-10-CM | POA: Insufficient documentation

## 2017-10-20 DIAGNOSIS — Z79899 Other long term (current) drug therapy: Secondary | ICD-10-CM | POA: Diagnosis not present

## 2017-10-20 DIAGNOSIS — C773 Secondary and unspecified malignant neoplasm of axilla and upper limb lymph nodes: Secondary | ICD-10-CM | POA: Diagnosis not present

## 2017-10-20 DIAGNOSIS — K589 Irritable bowel syndrome without diarrhea: Secondary | ICD-10-CM | POA: Diagnosis not present

## 2017-10-20 DIAGNOSIS — Z808 Family history of malignant neoplasm of other organs or systems: Secondary | ICD-10-CM | POA: Insufficient documentation

## 2017-10-20 DIAGNOSIS — Z7982 Long term (current) use of aspirin: Secondary | ICD-10-CM | POA: Insufficient documentation

## 2017-10-20 DIAGNOSIS — F17210 Nicotine dependence, cigarettes, uncomplicated: Secondary | ICD-10-CM | POA: Insufficient documentation

## 2017-10-20 DIAGNOSIS — J449 Chronic obstructive pulmonary disease, unspecified: Secondary | ICD-10-CM | POA: Diagnosis not present

## 2017-10-20 DIAGNOSIS — Z9884 Bariatric surgery status: Secondary | ICD-10-CM | POA: Diagnosis not present

## 2017-10-20 DIAGNOSIS — Z510 Encounter for antineoplastic radiation therapy: Secondary | ICD-10-CM | POA: Diagnosis not present

## 2017-10-20 DIAGNOSIS — C342 Malignant neoplasm of middle lobe, bronchus or lung: Secondary | ICD-10-CM | POA: Insufficient documentation

## 2017-10-20 DIAGNOSIS — F418 Other specified anxiety disorders: Secondary | ICD-10-CM | POA: Insufficient documentation

## 2017-10-20 DIAGNOSIS — C3490 Malignant neoplasm of unspecified part of unspecified bronchus or lung: Secondary | ICD-10-CM

## 2017-10-20 HISTORY — DX: Malignant neoplasm of unspecified part of unspecified bronchus or lung: C34.90

## 2017-10-20 NOTE — Progress Notes (Signed)
See progress note under physician encounter. 

## 2017-10-20 NOTE — Progress Notes (Signed)
Radiation Oncology         [phone]) [phone] ________________________________  Initial outpatient Consultation  Name: Yvonne Lang MRN: [id number]  Date of Service: 10/20/2017 DOB: January 27, 1966  CC:Redmond School, MD  Truitt Merle, MD   REFERRING PHYSICIAN: Truitt Merle, MD  DIAGNOSIS: 52 y.o. woman with metastatic non-small cell lung cancer, stage IV (cT3, cN1, cM1b).     ICD-10-CM   1. Malignant neoplasm of middle lobe, bronchus or lung ([place]) C34.2 Ambulatory referral to Social Work  2. Metastatic non-small cell lung cancer (HCC) C34.90     HISTORY OF PRESENT ILLNESS: Yvonne Lang is a 52 y.o. female seen at the request of Dr. Burr Medico.  She initially presented to her primary care provider, Dr. Gerarda Fraction, with a palpable left breast/chest wall mass present for approximately 3 months and increasing in size.  Dr. Gerarda Fraction referred her for mammogram which was performed on 09/20/2017 and did not reveal any suspicious lesions or calcifications in the left breast.  However the patient did have a palpable mass on exam at the inferior medial margin of the inframammary fold and therefore an ultrasound was ordered for further evaluation.  This revealed a 3.2 x 1.7 x 2.8 cm hypoechoic mass without associated lymphadenopathy in the left axilla.  She had an ultrasound-guided biopsy of the lesion on 09/20/2017 with final pathology revealing a metastatic adenocarcinoma, suspected GI origin.  She had an upper endoscopy on 10/10/2017 which was negative for any suspicious lesions.  The patient has had a gastric bypass previously.  A CT of the chest abdomen and pelvis was performed on 10/11/2017 revealing a large soft tissue mass measuring 6.8 x 6.6 cm in the right hilum causing obstruction of the middle lobe bronchus, a 5.5 x 3.7 cm right lower lobe masslike opacity suspicious for neoplasm versus post obstructive pneumonitis and a 3.3 x 2.4 cm soft tissue mass of the lower anterior chest wall correlating with the palpable mass on  exam.  There was no evidence of abdominal or pelvic metastatic disease and no associated lymphadenopathy.      An MRI of the brain was performed on 10/14/2017 and was negative for metastatic disease.  A PET scan has been ordered for further disease staging but has not yet been scheduled.  The patient has kindly been referred today to discuss the potential role of palliative radiotherapy to the central chest mass to relieve obstruction.  PREVIOUS RADIATION THERAPY: No  PAST MEDICAL HISTORY:  Past Medical History:  Diagnosis Date  . Anxiety   . Bronchitis   . COPD (chronic obstructive pulmonary disease) ([place])   . Depression   . GERD (gastroesophageal reflux disease)   . H/O gastric bypass    1999  . Irritable bowel syndrome   . Lung cancer ([place])       PAST SURGICAL HISTORY: Past Surgical History:  Procedure Laterality Date  . BACK SURGERY    . CHOLECYSTECTOMY    . COLON SURGERY     for a kink in her colon and 12 inchs removed  . COLONOSCOPY N/A 12/13/2013   Procedure: COLONOSCOPY;  Surgeon: Rogene Houston, MD;  Location: [facility];  Service: Endoscopy;  Laterality: N/A;  200  . ESOPHAGOGASTRODUODENOSCOPY N/A 12/13/2013   Procedure: ESOPHAGOGASTRODUODENOSCOPY (EGD);  Surgeon: Rogene Houston, MD;  Location: [facility];  Service: Endoscopy;  Laterality: N/A;  . Gastric Bypas  2000    FAMILY HISTORY:  Family History  Problem Relation Age of Onset  . Other Mother  has a pacemaker; rectal prolapse; rods in legs; lung fibrosis   . Other Father        heart gave out  . Hypertension Sister   . Bronchitis Sister   . Heart attack Paternal Grandmother   . COPD Maternal Grandmother   . Emphysema Maternal Grandmother   . Alcoholism Maternal Grandfather   . Cancer Sister        rare skin cancer  . Cancer Maternal Uncle 7       colon cancer   . Colon cancer Maternal Uncle     SOCIAL HISTORY:  Social History   Socioeconomic History  . Marital status: Single     Spouse name: Not on file  . Number of children: Not on file  . Years of education: Not on file  . Highest education level: Not on file  Occupational History  . Not on file  Social Needs  . Financial resource strain: Not on file  . Food insecurity:    Worry: Not on file    Inability: Not on file  . Transportation needs:    Medical: Not on file    Non-medical: Not on file  Tobacco Use  . Smoking status: Current Every Day Smoker    Packs/day: 1.00    Years: 40.00    Pack years: 40.00    Types: Cigarettes  . Smokeless tobacco: Never Used  . Tobacco comment: Pack a day x 30 yrs. Trying to stop smoking using nicotine gum.   Substance and Sexual Activity  . Alcohol use: No  . Drug use: No  . Sexual activity: Not Currently    Birth control/protection: Post-menopausal  Lifestyle  . Physical activity:    Days per week: Not on file    Minutes per session: Not on file  . Stress: Not on file  Relationships  . Social connections:    Talks on phone: Not on file    Gets together: Not on file    Attends religious service: Not on file    Active member of club or organization: Not on file    Attends meetings of clubs or organizations: Not on file    Relationship status: Not on file  . Intimate partner violence:    Fear of current or ex partner: Not on file    Emotionally abused: Not on file    Physically abused: Not on file    Forced sexual activity: Not on file  Other Topics Concern  . Not on file  Social History Narrative  . Not on file    ALLERGIES: Codeine  MEDICATIONS:  Current Outpatient Medications  Medication Sig Dispense Refill  . albuterol (PROVENTIL HFA) 108 (90 Base) MCG/ACT inhaler Inhale 2 puffs into the lungs 2 (two) times daily.     Marland Kitchen albuterol (PROVENTIL) 4 MG tablet Take 4 mg by mouth 2 (two) times daily.     . butalbital-acetaminophen-caffeine (FIORICET, ESGIC) 50-325-40 MG tablet Take 1-2 tablets by mouth every 6 (six) hours as needed for headache. 40 tablet  0  . dicyclomine (BENTYL) 20 MG tablet Take 1 tablet (20 mg total) by mouth every 6 (six) hours. 60 tablet 1  . DULoxetine (CYMBALTA) 60 MG capsule Take 60 mg by mouth daily.    . Multiple Vitamin (MULTIVITAMIN) tablet Take 1 tablet by mouth daily.    Marland Kitchen omeprazole (PRILOSEC) 20 MG capsule Take 1 capsule (20 mg total) by mouth 2 (two) times daily before a meal. 60 capsule 0  . potassium  chloride SA (K-DUR,KLOR-CON) 20 MEQ tablet Take 1 tablet (20 mEq total) by mouth 2 (two) times daily. 60 tablet 0  . predniSONE (DELTASONE) 10 MG tablet Take 4 tablets (40 mg total) by mouth daily with breakfast. 40 tablet 0  . sucralfate (CARAFATE) 1 g tablet Take 1 tablet (1 g total) by mouth 4 (four) times daily. 120 tablet 1  . traMADol (ULTRAM) 50 MG tablet Take 1 tablet (50 mg total) by mouth every 6 (six) hours as needed. 90 tablet 0  . Ascorbic Acid (VITAMIN C) 100 MG tablet Take 100 mg by mouth daily.    Marland Kitchen aspirin 81 MG tablet Take 81 mg by mouth daily.    Marland Kitchen CALCIUM PO Take by mouth daily.    Marland Kitchen estradiol (ESTRACE) 2 MG tablet Take 1 tablet (2 mg total) by mouth daily. (Patient not taking: Reported on 10/20/2017) 30 tablet 6  . gabapentin (NEURONTIN) 300 MG capsule TAKE 1 CAPSULE BY MOUTH 4 TIMES DAILY  4  . methocarbamol (ROBAXIN) 750 MG tablet Take 750 mg by mouth 4 (four) times daily.    . mirtazapine (REMERON) 7.5 MG tablet Take 1 tablet (7.5 mg total) by mouth at bedtime. (Patient not taking: Reported on 10/20/2017) 30 tablet 0  . Omega-3 Fatty Acids (FISH OIL) 600 MG CAPS Take 600 mg by mouth daily.     . ondansetron (ZOFRAN) 8 MG tablet Take 1 tablet (8 mg total) by mouth every 8 (eight) hours as needed for nausea or vomiting. (Patient not taking: Reported on 10/20/2017) 30 tablet 1  . Probiotic Product (PROBIOTIC-10 PO) Take by mouth.    . prochlorperazine (COMPAZINE) 10 MG tablet Take 1 tablet (10 mg total) by mouth every 6 (six) hours as needed for nausea or vomiting. (Patient not taking: Reported on  10/20/2017) 30 tablet 0  . progesterone (PROMETRIUM) 200 MG capsule Take 1 daily at HS (Patient not taking: Reported on 10/20/2017) 30 capsule 6  . RaNITidine HCl (ZANTAC PO) Take by mouth 3 (three) times daily.    Marland Kitchen UNABLE TO FIND Inhale into the lungs. Med Name: Trilogy as needed     No current facility-administered medications for this encounter.     REVIEW OF SYSTEMS:  On review of systems, the patient reports that she is doing well overall. She denies any chest pain, increased cough, hemoptysis, fevers, chills, night sweats, or unintended weight changes. She has COPD/emphysema and uses an inhaler.  She has noticed that she is more short of breath with even minimal activity over the past 1-2 months.  She denies any bowel or bladder disturbances, and denies abdominal pain, nausea or vomiting. She denies any new musculoskeletal or joint aches or pains. A complete review of systems is obtained and is otherwise negative.   PHYSICAL EXAM:  Wt Readings from Last 3 Encounters:  10/20/17 193 lb 6.4 oz (87.7 kg)  10/17/17 193 lb 6.4 oz (87.7 kg)  10/10/17 193 lb 9.6 oz (87.8 kg)   Temp Readings from Last 3 Encounters:  10/20/17 97.6 F (36.4 C) (Oral)  10/17/17 97.7 F (36.5 C) (Oral)  10/10/17 (!) 97.3 F (36.3 C) (Temporal)   BP Readings from Last 3 Encounters:  10/20/17 109/85  10/17/17 (!) 119/54  10/10/17 (!) 96/53   Pulse Readings from Last 3 Encounters:  10/20/17 84  10/17/17 (!) 118  10/10/17 79   Pain Assessment Pain Score: 0-No pain/10  In general this is a well appearing caucasian women  Is in no acute  distress. She is alert and oriented x4 and appropriate throughout the examination. HEENT reveals that the patient is normocephalic, atraumatic. EOMs are intact. PERRLA.  Approximately 3 cm palpable mass is mobile over the chest wall, overall skin is intact. Mass is not tender to palpation and without surrounding erythema.  Cardiovascular exam reveals a regular rate and rhythm, no  clicks rubs or murmurs are auscultated. Chest is clear to auscultation bilaterally. Lymphatic assessment is performed and does not reveal any adenopathy in the cervical, supraclavicular, axillary, or inguinal chains. Abdomen has active bowel sounds in all quadrants and is intact. The abdomen is soft, non tender, non distended. Lower extremities are negative for pretibial pitting edema, deep calf tenderness, cyanosis or clubbing.  KPS = 80  100 - Normal; no complaints; no evidence of disease. 90   - Able to carry on normal activity; minor signs or symptoms of disease. 80   - Normal activity with effort; some signs or symptoms of disease. 19   - Cares for self; unable to carry on normal activity or to do active work. 60   - Requires occasional assistance, but is able to care for most of his personal needs. 50   - Requires considerable assistance and frequent medical care. 17   - Disabled; requires special care and assistance. 30   - Severely disabled; hospital admission is indicated although death not imminent. 61   - Very sick; hospital admission necessary; active supportive treatment necessary. 10   - Moribund; fatal processes progressing rapidly. 0     - Dead  Karnofsky DA, Abelmann Benson, Craver LS and Burchenal [facility] [phone]) The use of the nitrogen mustards in the palliative treatment of carcinoma: with particular reference to bronchogenic carcinoma Cancer 1 634-56  LABORATORY DATA:  Lab Results  Component Value Date   WBC 19.0 (H) 10/04/2017   HGB 12.2 10/04/2017   HCT 38.1 10/04/2017   MCV 87.2 10/04/2017   PLT 538 (H) 10/04/2017   Lab Results  Component Value Date   NA 140 10/04/2017   K 2.8 (LL) 10/04/2017   CL 104 10/04/2017   CO2 27 10/04/2017   Lab Results  Component Value Date   ALT 100 (H) 10/04/2017   AST 31 10/04/2017   ALKPHOS 187 (H) 10/04/2017   BILITOT <0.2 (L) 10/04/2017     RADIOGRAPHY: Ct Abdomen Pelvis Wo Contrast  Result Date: 10/12/2017 CLINICAL DATA:   Metastatic adenocarcinoma of unknown primary. Epigastric pain, unintentional weight loss, nonproductive cough and shortness of breath. EXAM: CT CHEST, ABDOMEN AND PELVIS WITHOUT CONTRAST TECHNIQUE: Multidetector CT imaging of the chest, abdomen and pelvis was performed following the standard protocol without IV contrast. COMPARISON:  [facility] CT on 03/01/2014; no prior chest CT FINDINGS: CT CHEST FINDINGS Cardiovascular: No acute findings. Mediastinum/Lymph Nodes: Large mass involving the right hilum, as described below. No pathologically enlarged mediastinal or left hilar lymph nodes seen on this unenhanced exam. Lungs/Pleura: A large soft tissue mass is seen involving the right hilum which obstructs the right middle lobe bronchus. This measures 6.8 x 6.6 cm. There is also encasement and narrowing of the central right upper lobe bronchus and bronchus intermedius.Postobstructive collapse of right middle lobe is seen. Masslike opacity in the superior segment of the right lower lobe measuring 5.5 x 3.7 cm may also represent neoplasm or postobstructive pneumonitis. Patchy airspace disease in peripheral right upper lobe is consistent with postobstructive pneumonitis. No evidence of pleural effusion. Musculoskeletal: No suspicious bone lesions identified. A soft tissue mass  is seen in the lower anterior chest wall measuring 3.3 x 2.4 cm on image 40/2. CT ABDOMEN AND PELVIS FINDINGS Hepatobiliary: No masses visualized on this unenhanced exam. Prior cholecystectomy. No evidence of biliary obstruction. Pancreas: No mass or inflammatory changes identified on this unenhanced exam. Spleen:  Within normal limits in size. Adrenals/Urinary Tract: No evidence of adrenal mass. No evidence of urolithiasis or hydronephrosis. Unremarkable appearance of bladder. Stomach/Bowel: Previous gastric bypass surgery noted. No evidence of obstruction, inflammatory process, or abnormal fluid collections. Vascular/Lymphatic: No pathologically enlarged  lymph nodes identified. No abdominal aortic aneurysm. Reproductive:  No masses or other significant abnormality. Other:  None. Musculoskeletal:    No suspicious bone lesions identified. IMPRESSION: 7 cm central right lung mass involving the right hilum, with postobstructive collapse of the right middle lobe, highly suspicious for primary bronchogenic carcinoma. 5 cm masslike opacity in the superior segment of the right lower lobe may represent carcinoma or postobstructive pneumonitis. 3.3 cm soft tissue mass in the lower anterior chest wall soft tissues, suspicious for metastatic disease. No evidence of abdominal or pelvic metastatic disease. Electronically Signed   By: Earle Gell M.D.   On: 10/12/2017 10:42   Ct Chest Wo Contrast  Result Date: 10/12/2017 CLINICAL DATA:  Metastatic adenocarcinoma of unknown primary. Epigastric pain, unintentional weight loss, nonproductive cough and shortness of breath. EXAM: CT CHEST, ABDOMEN AND PELVIS WITHOUT CONTRAST TECHNIQUE: Multidetector CT imaging of the chest, abdomen and pelvis was performed following the standard protocol without IV contrast. COMPARISON:  [facility] CT on 03/01/2014; no prior chest CT FINDINGS: CT CHEST FINDINGS Cardiovascular: No acute findings. Mediastinum/Lymph Nodes: Large mass involving the right hilum, as described below. No pathologically enlarged mediastinal or left hilar lymph nodes seen on this unenhanced exam. Lungs/Pleura: A large soft tissue mass is seen involving the right hilum which obstructs the right middle lobe bronchus. This measures 6.8 x 6.6 cm. There is also encasement and narrowing of the central right upper lobe bronchus and bronchus intermedius.Postobstructive collapse of right middle lobe is seen. Masslike opacity in the superior segment of the right lower lobe measuring 5.5 x 3.7 cm may also represent neoplasm or postobstructive pneumonitis. Patchy airspace disease in peripheral right upper lobe is consistent with postobstructive  pneumonitis. No evidence of pleural effusion. Musculoskeletal: No suspicious bone lesions identified. A soft tissue mass is seen in the lower anterior chest wall measuring 3.3 x 2.4 cm on image 40/2. CT ABDOMEN AND PELVIS FINDINGS Hepatobiliary: No masses visualized on this unenhanced exam. Prior cholecystectomy. No evidence of biliary obstruction. Pancreas: No mass or inflammatory changes identified on this unenhanced exam. Spleen:  Within normal limits in size. Adrenals/Urinary Tract: No evidence of adrenal mass. No evidence of urolithiasis or hydronephrosis. Unremarkable appearance of bladder. Stomach/Bowel: Previous gastric bypass surgery noted. No evidence of obstruction, inflammatory process, or abnormal fluid collections. Vascular/Lymphatic: No pathologically enlarged lymph nodes identified. No abdominal aortic aneurysm. Reproductive:  No masses or other significant abnormality. Other:  None. Musculoskeletal:    No suspicious bone lesions identified. IMPRESSION: 7 cm central right lung mass involving the right hilum, with postobstructive collapse of the right middle lobe, highly suspicious for primary bronchogenic carcinoma. 5 cm masslike opacity in the superior segment of the right lower lobe may represent carcinoma or postobstructive pneumonitis. 3.3 cm soft tissue mass in the lower anterior chest wall soft tissues, suspicious for metastatic disease. No evidence of abdominal or pelvic metastatic disease. Electronically Signed   By: Earle Gell M.D.   On:  10/12/2017 10:42   Mr Jeri Cos HK Contrast  Result Date: 10/14/2017 CLINICAL DATA:  52 year old female with metastatic adenocarcinoma, primary currently unknown. Staging. EXAM: MRI HEAD WITHOUT AND WITH CONTRAST TECHNIQUE: Multiplanar, multiecho pulse sequences of the brain and surrounding structures were obtained without and with intravenous contrast. CONTRAST:  45m MULTIHANCE GADOBENATE DIMEGLUMINE 529 MG/ML IV SOLN [facility] FINDINGS: Brain: No abnormal enhancement identified. No midline shift, mass effect, or evidence of intracranial mass lesion. No dural thickening. No restricted diffusion to suggest acute infarction. No ventriculomegaly, extra-axial collection or acute intracranial hemorrhage. Cervicomedullary junction and pituitary are within normal limits. GPearline Cablesand white matter signal is within normal limits throughout the brain. No encephalomalacia or chronic cerebral blood products identified. Vascular: Major intracranial vascular flow voids are preserved. The major dural venous sinuses are enhancing and appear patent. Skull and upper cervical spine: Chronic C3-C4 disc and endplate degeneration. Visible bone marrow signal is within normal limits. Sinuses/Orbits: Normal orbits soft tissues. Mild to moderate left sphenoid sinus mucosal thickening is stable, other paranasal sinuses remain clear. Other: Visible internal auditory structures appear normal. Mastoid air cells are clear. Scalp and face soft tissues appear negative. IMPRESSION: 1. No metastatic disease or acute intracranial abnormality. Normal MRI appearance of the brain. 2. 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 Electronically Signed   By: HGenevie AnnM.D.   On: 10/14/2017 13:48      IMPRESSION/PLAN: 1. 52y.o. woman with metastatic, non-small cell lung cancer, stage IV (cT3, cN1, cM1b).   Today, we talked to the patient and family about the findings and work-up thus far.  We discussed the natural history of Stage IV NSCLC and general treatment, highlighting the role of palliative radiotherapy in the management.  We discussed the available radiation techniques, and focused on the details of logistics and delivery.  At this time, the recommendation is to proceed with a short course of palliative radiotherapy to the central lung mass delivered over 10 daily treatments to relieve obstruction and improve  aeration.  We discussed the need for additional information from PET scan to determine if the disease appears only locally advanced or if there are any distant metastases.  Additionally, Foundation One and PD-L1 testing is pending to help guide systemic treatment options.  If PET reveals locally advanced disease only and patient responds well to the recommended palliative radiotherapy, we could consider proceeding with additional radiotherapy concurrent with chemotherapy for more durable disease control. We reviewed the anticipated acute and late sequelae associated with radiation in this setting.  The patient and family were encouraged to ask questions that were answered to their stated satisfaction.   At the conclusion of our conversation, the patient elects to proceed with palliative radiotherapy as recommended.  She has freely signed written consent today in the office and a copy of this document has been placed in her chart.  She will proceed with CT simulation today at 2pm in anticipation of beginning treatment in the near future.  We spent 60 minutes minutes face to face with the patient and more than 50% of that time was spent in counseling and/or coordination of care.   -------------------------------------------------------------------------------------------------------------  ANicholos Johns PA-C    MTyler Pita MD  [place] [phone] Fax: [phone]  Skype  [email or web address]   This document serves as a record of services personally performed by MTyler Pita MD and Ashlyn Bruning PA-C. It  was created on their behalf by Delton Coombes, a trained medical scribe. The creation of this record is based on the scribe's personal observations and the provider's statements to them.

## 2017-10-20 NOTE — Progress Notes (Signed)
  Radiation Oncology         [phone]) [phone] ________________________________  Name: Yvonne Lang MRN: [id number]  Date: 10/20/2017  DOB: 07/10/1965  SIMULATION AND TREATMENT PLANNING NOTE    ICD-10-CM   1. Metastatic non-small cell lung cancer (HCC) C34.90     DIAGNOSIS:  52 y.o. woman with metastatic non-small cell lung cancer, stage IV (cT3, cN1, cM1b)  NARRATIVE:  The patient was brought to the [facility].  Identity was confirmed.  All relevant records and images related to the planned course of therapy were reviewed.  The patient freely provided informed written consent to proceed with treatment after reviewing the details related to the planned course of therapy. The consent form was witnessed and verified by the simulation staff.  Then, the patient was set-up in a stable reproducible  supine position for radiation therapy.  CT images were obtained.  Surface markings were placed.  The CT images were loaded into the planning software.  Then the target and avoidance structures were contoured.  Treatment planning then occurred.  The radiation prescription was entered and confirmed.  Then, I designed and supervised the construction of a total of 6 medically necessary complex treatment devices, including a BodyFix immobilization mold custom fitted to the patient along with 5 multileaf collimators conformally shaped radiation around the treatment target while shielding critical structures such as the heart and spinal cord maximally.  I have requested : 3D Simulation  I have requested a DVH of the following structures: Left lung, right lung, spinal cord, heart, esophagus, and target.  I have ordered:Nutrition Consult  SPECIAL TREATMENT PROCEDURE:  The planned course of therapy using radiation constitutes a special treatment procedure. Special care is required in the management of this patient for the following reasons.  The patient will be receiving concurrent chemotherapy requiring  careful monitoring for increased toxicities of treatment including periodic laboratory values.  The special nature of the planned course of radiotherapy will require increased physician supervision and oversight to ensure patient's safety with optimal treatment outcomes.  PLAN:  The patient will receive 30 Gy in 10 fractions.  ________________________________  Sheral Apley Tammi Klippel, M.D.  This document serves as a record of services personally performed by [name] MD. It was created on his behalf by Delton Coombes, a trained medical scribe. The creation of this record is based on the scribe's personal observations and the provider's statements to them.

## 2017-10-24 ENCOUNTER — Encounter: Payer: Self-pay | Admitting: Hematology

## 2017-10-24 ENCOUNTER — Ambulatory Visit
Admission: RE | Admit: 2017-10-24 | Discharge: 2017-10-24 | Disposition: A | Discharge status: Home / Self Care | Payer: No Typology Code available for payment source | Source: Ambulatory Visit | Attending: Radiation Oncology | Admitting: Radiation Oncology

## 2017-10-24 DIAGNOSIS — Z510 Encounter for antineoplastic radiation therapy: Secondary | ICD-10-CM | POA: Diagnosis not present

## 2017-10-25 ENCOUNTER — Ambulatory Visit
Admission: RE | Admit: 2017-10-25 | Discharge: 2017-10-25 | Disposition: A | Discharge status: Home / Self Care | Payer: No Typology Code available for payment source | Source: Ambulatory Visit | Attending: Radiation Oncology | Admitting: Radiation Oncology

## 2017-10-25 ENCOUNTER — Telehealth: Payer: Self-pay | Admitting: Hematology

## 2017-10-25 ENCOUNTER — Encounter (HOSPITAL_COMMUNITY): Payer: Self-pay | Admitting: Hematology

## 2017-10-25 DIAGNOSIS — Z510 Encounter for antineoplastic radiation therapy: Secondary | ICD-10-CM | POA: Diagnosis not present

## 2017-10-25 NOTE — Telephone Encounter (Signed)
I called patient back, regarding her PET scan, which was denied again by her insurance due to her known metastatic disease.  Patient was upset, I told patient that the PET will unlikely change our treatment plan, and it's OK not to have it. I plan to see her next week, to discuss her FO result and finalize her systemic treatment based on the FO results. She voiced good understanding and appreciated the call.  Yvonne Lang  10/25/2017

## 2017-10-26 ENCOUNTER — Ambulatory Visit
Admission: RE | Admit: 2017-10-26 | Discharge: 2017-10-26 | Disposition: A | Discharge status: Home / Self Care | Payer: No Typology Code available for payment source | Source: Ambulatory Visit | Attending: Radiation Oncology | Admitting: Radiation Oncology

## 2017-10-26 DIAGNOSIS — Z510 Encounter for antineoplastic radiation therapy: Secondary | ICD-10-CM | POA: Diagnosis not present

## 2017-10-27 ENCOUNTER — Ambulatory Visit
Admission: RE | Admit: 2017-10-27 | Discharge: 2017-10-27 | Disposition: A | Discharge status: Home / Self Care | Payer: No Typology Code available for payment source | Source: Ambulatory Visit | Attending: Radiation Oncology | Admitting: Radiation Oncology

## 2017-10-27 ENCOUNTER — Telehealth: Payer: Self-pay | Admitting: Hematology

## 2017-10-27 DIAGNOSIS — C3490 Malignant neoplasm of unspecified part of unspecified bronchus or lung: Secondary | ICD-10-CM

## 2017-10-27 DIAGNOSIS — Z510 Encounter for antineoplastic radiation therapy: Secondary | ICD-10-CM | POA: Diagnosis not present

## 2017-10-27 MED ORDER — RADIAPLEXRX EX GEL
Freq: Once | CUTANEOUS | Status: AC
  Administered 2017-10-27: 11:00:00 via TOPICAL

## 2017-10-27 NOTE — Progress Notes (Signed)
Pt here for patient teaching.  Pt given Radiation and You booklet, skin care instructions and Radiaplex gel.  Reviewed areas of pertinence such as fatigue, skin changes, throat changes, cough and shortness of breath . Pt able to give teach back of to pat skin and use unscented/gentle soap,apply Radiaplex bid and avoid applying anything to skin within 4 hours of treatment. Pt demonstrated understanding, needs reinforcement, no evidence of learning, refused teaching and  of information given and will contact nursing with any questions or concerns.     [email or web address]/[email or web address]

## 2017-10-27 NOTE — Telephone Encounter (Signed)
Appointment scheduled and I spoke with patient per 8/7 staff message

## 2017-10-28 ENCOUNTER — Telehealth: Payer: Self-pay | Admitting: Hematology

## 2017-10-28 ENCOUNTER — Inpatient Hospital Stay: Payer: No Typology Code available for payment source | Attending: Hematology | Admitting: Nurse Practitioner

## 2017-10-28 ENCOUNTER — Encounter: Payer: Self-pay | Admitting: Nurse Practitioner

## 2017-10-28 ENCOUNTER — Ambulatory Visit
Admission: RE | Admit: 2017-10-28 | Discharge: 2017-10-28 | Disposition: A | Discharge status: Home / Self Care | Payer: No Typology Code available for payment source | Source: Ambulatory Visit | Attending: Radiation Oncology | Admitting: Radiation Oncology

## 2017-10-28 ENCOUNTER — Other Ambulatory Visit: Payer: Self-pay | Admitting: Hematology

## 2017-10-28 VITALS — BP 107/75 | HR 102 | Temp 97.7°F | Resp 18 | Ht 67.0 in | Wt 190.3 lb

## 2017-10-28 DIAGNOSIS — E876 Hypokalemia: Secondary | ICD-10-CM | POA: Diagnosis not present

## 2017-10-28 DIAGNOSIS — F3181 Bipolar II disorder: Secondary | ICD-10-CM | POA: Insufficient documentation

## 2017-10-28 DIAGNOSIS — R530 Neoplastic (malignant) related fatigue: Secondary | ICD-10-CM

## 2017-10-28 DIAGNOSIS — Z5189 Encounter for other specified aftercare: Secondary | ICD-10-CM | POA: Insufficient documentation

## 2017-10-28 DIAGNOSIS — R05 Cough: Secondary | ICD-10-CM

## 2017-10-28 DIAGNOSIS — R51 Headache: Secondary | ICD-10-CM | POA: Insufficient documentation

## 2017-10-28 DIAGNOSIS — Z5112 Encounter for antineoplastic immunotherapy: Secondary | ICD-10-CM | POA: Insufficient documentation

## 2017-10-28 DIAGNOSIS — D72829 Elevated white blood cell count, unspecified: Secondary | ICD-10-CM | POA: Insufficient documentation

## 2017-10-28 DIAGNOSIS — C3431 Malignant neoplasm of lower lobe, right bronchus or lung: Secondary | ICD-10-CM | POA: Insufficient documentation

## 2017-10-28 DIAGNOSIS — K219 Gastro-esophageal reflux disease without esophagitis: Secondary | ICD-10-CM

## 2017-10-28 DIAGNOSIS — M549 Dorsalgia, unspecified: Secondary | ICD-10-CM | POA: Diagnosis not present

## 2017-10-28 DIAGNOSIS — Z510 Encounter for antineoplastic radiation therapy: Secondary | ICD-10-CM | POA: Diagnosis not present

## 2017-10-28 DIAGNOSIS — G8929 Other chronic pain: Secondary | ICD-10-CM | POA: Diagnosis not present

## 2017-10-28 DIAGNOSIS — F419 Anxiety disorder, unspecified: Secondary | ICD-10-CM | POA: Diagnosis not present

## 2017-10-28 DIAGNOSIS — Z5111 Encounter for antineoplastic chemotherapy: Secondary | ICD-10-CM | POA: Insufficient documentation

## 2017-10-28 DIAGNOSIS — Z79899 Other long term (current) drug therapy: Secondary | ICD-10-CM | POA: Diagnosis not present

## 2017-10-28 DIAGNOSIS — C7989 Secondary malignant neoplasm of other specified sites: Secondary | ICD-10-CM | POA: Diagnosis not present

## 2017-10-28 DIAGNOSIS — M5417 Radiculopathy, lumbosacral region: Secondary | ICD-10-CM | POA: Insufficient documentation

## 2017-10-28 DIAGNOSIS — Z9884 Bariatric surgery status: Secondary | ICD-10-CM | POA: Diagnosis not present

## 2017-10-28 DIAGNOSIS — Z7982 Long term (current) use of aspirin: Secondary | ICD-10-CM | POA: Diagnosis not present

## 2017-10-28 DIAGNOSIS — Z923 Personal history of irradiation: Secondary | ICD-10-CM | POA: Insufficient documentation

## 2017-10-28 DIAGNOSIS — C3490 Malignant neoplasm of unspecified part of unspecified bronchus or lung: Secondary | ICD-10-CM

## 2017-10-28 MED ORDER — ALPRAZOLAM 0.25 MG PO TABS: 0.2500 mg | ORAL_TABLET | Freq: Every evening | ORAL | 0 refills | Status: DC | PRN

## 2017-10-28 MED ORDER — BUTALBITAL-APAP-CAFFEINE 50-325-40 MG PO TABS: 1.0000 | ORAL_TABLET | Freq: Four times a day (QID) | ORAL | 1 refills | Status: DC | PRN

## 2017-10-28 MED ORDER — PREDNISONE 10 MG PO TABS: 40.0000 mg | ORAL_TABLET | Freq: Every day | ORAL | 0 refills | Status: DC

## 2017-10-28 NOTE — Progress Notes (Addendum)
[place]  Telephone:([phone] [phone]  Clinic Follow up Note   Patient Care Team: Redmond School, MD as PCP - General (Internal Medicine) 10/28/2017  SUMMARY OF ONCOLOGIC HISTORY: Oncology History   Cancer Staging Metastatic non-small cell lung cancer [facility]) Staging form: Lung, AJCC 8th Edition - Clinical stage from 10/17/2017: Stage IV (cT3, cN1, cM1b) - Signed by Truitt Merle, MD on 10/17/2017       Metastatic non-small cell lung cancer ([place])   09/20/2017 Imaging    US Breast Left 09/20/17  IMPRESSION Indeterminte palpable mass measuring 3.2x1.7x2.8 cm  inferior medial to the inframammary fold of the left breast.      09/20/2017 Initial Biopsy    Diagnosis 09/20/17 Soft Tissue Needle Core Biopsy, inferior medial to IMF - ADENOCARCINOMA. - SEE MICROSCOPIC DESCRIPTION.  Microscopic Comment Immunohistochemistry will be performed and reported as an addendum. (JDP:ah 09/23/17) ADDENDUM: Immunohistochemistry shows the tumor is strongly positive with cytokeratin AE1/AE3, cytokeratin 7 and shows moderate weak positivity with CDX-2. The tumor is negative with estrogen receptor, progesterone receptor, GCDFP, GATA-3, Napsin A, TTF-1, WT-1 and cytokeratin 20. The immunophenotype is consistent with metastatic carcinoma. The combination of CDX-2 and cytokeratin 7 positivity raises the possibility of an upper gastrointestinal primary. Clinical correlation is essential.    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 Silverio Decamp 10/10/17  IMPRESSION - Normal esophagus. - Z-line regular, 35 cm from the incisors. - Gastric bypass with a normal-sized pouch and intact staple line. Gastrojejunal anastomosis characterized by healthy appearing mucosa. - No specimens collected.    10/12/2017 Imaging    CT CAP WO Contrast 10/12/17  IMPRESSION: 7 cm central  right lung mass involving the right hilum, with postobstructive collapse of the right middle lobe, highly suspicious for primary bronchogenic carcinoma. 5 cm masslike opacity in the superior segment of the right lower lobe may represent carcinoma or postobstructive pneumonitis. 3.3 cm soft tissue mass in the lower anterior chest wall soft tissues, suspicious for metastatic disease. No evidence of abdominal or pelvic metastatic disease.     10/14/2017 Imaging    MRI Brain 10/14/17  IMPRESSION: 1. No metastatic disease or acute intracranial abnormality. Normal MRI appearance of the brain. 2. 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      10/17/2017 Cancer Staging    Staging form: Lung, AJCC 8th Edition - Clinical stage from 10/17/2017: Stage IV (cT3, cN1, cM1b) - Signed by Truitt Merle, MD on 10/17/2017     INTERVAL HISTORY: Ms. Rahrig presents today with her mother for treatment discussion and review of FO. She is having a good day today, with less weakness and fatigue than other days. She feels she has more good than bad days, on which she spends most of the day in the recliner. Today she felt well enough to do dishes. Appetite is improving on mirtazapine. She continues to have dry "constant" cough and dyspnea on exertion. Respiratory function is better on steroids. She felt "ok" with little dyspnea on walk from lobby to exam room. She began RT 5 days ago. Right lung and chest pain fluctuates, but has been more tolerable lately. She rates pain 3/10. Daily headaches are stable, takes 2 fioricet daily.    REVIEW OF SYSTEMS:   Constitutional: Denies fevers, chills or abnormal weight loss (+) fatigue, fluctuates (+) appetite improving (+) 3 lbs weight loss  Ears, nose,  mouth, throat, and face: Denies mucositis  Respiratory: Denies wheezes (+) constant dry cough (+) DOE (+) right lung/chest wall pain, improving  Cardiovascular: Denies palpitation, chest discomfort or lower  extremity swelling (+) middle chest nodule pain Gastrointestinal:  Denies nausea, vomiting, constipation, heartburn or change in bowel habits (+) daily BM with loose stool at baseline  Skin: Denies abnormal skin rashes Lymphatics: Denies new lymphadenopathy or easy bruising Neurological:Denies numbness, tingling or new weaknesses (+) daily headaches, stable  Behavioral/Psych: Denies anxiety or depression  All other systems were reviewed with the patient and are negative.  MEDICAL HISTORY:  Past Medical History:  Diagnosis Date  . Anxiety   . Bronchitis   . COPD (chronic obstructive pulmonary disease) ([place])   . Depression   . GERD (gastroesophageal reflux disease)   . H/O gastric bypass    1999  . Irritable bowel syndrome   . Lung cancer [facility])     SURGICAL HISTORY: Past Surgical History:  Procedure Laterality Date  . BACK SURGERY    . CHOLECYSTECTOMY    . COLON SURGERY     for a kink in her colon and 12 inchs removed  . COLONOSCOPY N/A 12/13/2013   Procedure: COLONOSCOPY;  Surgeon: Rogene Houston, MD;  Location: [facility];  Service: Endoscopy;  Laterality: N/A;  200  . ESOPHAGOGASTRODUODENOSCOPY N/A 12/13/2013   Procedure: ESOPHAGOGASTRODUODENOSCOPY (EGD);  Surgeon: Rogene Houston, MD;  Location: [facility] ENDO SUITE;  Service: Endoscopy;  Laterality: N/A;  . Gastric Bypas  2000    I have reviewed the social history and family history with the patient and they are unchanged from previous note.  ALLERGIES:  is allergic to codeine.  MEDICATIONS:  Current Outpatient Medications  Medication Sig Dispense Refill  . albuterol (PROVENTIL HFA) 108 (90 Base) MCG/ACT inhaler Inhale 2 puffs into the lungs 2 (two) times daily.     Marland Kitchen albuterol (PROVENTIL) 4 MG tablet Take 4 mg by mouth 2 (two) times daily.     . butalbital-acetaminophen-caffeine (FIORICET, ESGIC) 50-325-40 MG tablet Take 1-2 tablets by mouth every 6 (six) hours as needed for headache. 40 tablet 1  . CALCIUM PO Take by  mouth daily.    Marland Kitchen dicyclomine (BENTYL) 20 MG tablet Take 1 tablet (20 mg total) by mouth every 6 (six) hours. 60 tablet 1  . DULoxetine (CYMBALTA) 60 MG capsule Take 60 mg by mouth daily.    Marland Kitchen gabapentin (NEURONTIN) 300 MG capsule TAKE 1 CAPSULE BY MOUTH 4 TIMES DAILY  4  . mirtazapine (REMERON) 7.5 MG tablet Take 1 tablet (7.5 mg total) by mouth at bedtime. 30 tablet 0  . Multiple Vitamin (MULTIVITAMIN) tablet Take 1 tablet by mouth daily.    Marland Kitchen omeprazole (PRILOSEC) 20 MG capsule Take 1 capsule (20 mg total) by mouth 2 (two) times daily before a meal. 60 capsule 0  . ondansetron (ZOFRAN) 8 MG tablet Take 1 tablet (8 mg total) by mouth every 8 (eight) hours as needed for nausea or vomiting. 30 tablet 1  . potassium chloride SA (K-DUR,KLOR-CON) 20 MEQ tablet Take 1 tablet (20 mEq total) by mouth 2 (two) times daily. 60 tablet 0  . predniSONE (DELTASONE) 10 MG tablet Take 4 tablets (40 mg total) by mouth daily with breakfast. 56 tablet 0  . Probiotic Product (PROBIOTIC-10 PO) Take by mouth.    . prochlorperazine (COMPAZINE) 10 MG tablet Take 1 tablet (10 mg total) by mouth every 6 (six) hours as needed for nausea or vomiting. [place]  tablet 0  . sucralfate (CARAFATE) 1 g tablet Take 1 tablet (1 g total) by mouth 4 (four) times daily. 120 tablet 1  . traMADol (ULTRAM) 50 MG tablet Take 1 tablet (50 mg total) by mouth every 6 (six) hours as needed. 90 tablet 0  . UNABLE TO FIND Inhale into the lungs. Med Name: Trilogy as needed    . ALPRAZolam (XANAX) 0.25 MG tablet Take 1 tablet (0.25 mg total) by mouth at bedtime as needed for anxiety. 30 tablet 0  . Ascorbic Acid (VITAMIN C) 100 MG tablet Take 100 mg by mouth daily.    Marland Kitchen aspirin 81 MG tablet Take 81 mg by mouth daily.    Marland Kitchen estradiol (ESTRACE) 2 MG tablet Take 1 tablet (2 mg total) by mouth daily. 30 tablet 6  . methocarbamol (ROBAXIN) 750 MG tablet Take 750 mg by mouth 4 (four) times daily.    . Omega-3 Fatty Acids (FISH OIL) 600 MG CAPS Take 600 mg by  mouth daily.     . progesterone (PROMETRIUM) 200 MG capsule Take 1 daily at HS 30 capsule 6  . RaNITidine HCl (ZANTAC PO) Take by mouth 3 (three) times daily.     No current facility-administered medications for this visit.     PHYSICAL EXAMINATION: ECOG PERFORMANCE STATUS: 3 - Symptomatic, >50% confined to bed  Vitals:   10/28/17 1434  BP: 107/75  Pulse: (!) 102  Resp: 18  Temp: 97.7 F (36.5 C)  SpO2: 100%   Filed Weights   10/28/17 1434  Weight: 190 lb 4.8 oz (86.3 kg)    GENERAL:alert, no distress and comfortable SKIN: no rashes or significant lesions EYES: sclera clear OROPHARYNX:no thrush or ulcers  LYMPH:  no palpable cervical or supraclavicular lymphadenopathy  LUNGS: clear to auscultation with normal breathing effort HEART: regular rate & rhythm and no murmurs and no lower extremity edema Chest: 3.5 cm subcutaneous chest nodule, tender   ABDOMEN: abdomen soft, non-tender and normal bowel sounds Musculoskeletal:no cyanosis of digits and no clubbing  NEURO: alert & oriented x 3 with fluent speech, no focal motor/sensory deficits  LABORATORY DATA:  I have reviewed the data as listed CBC Latest Ref Rng & Units 10/04/2017 10/25/2013  WBC 3.9 - 10.3 K/uL 19.0(H) 8.8  Hemoglobin 11.6 - 15.9 g/dL 12.2 12.1  Hematocrit 34.8 - 46.6 % 38.1 36.3  Platelets 145 - 400 K/uL 538(H) 361     CMP Latest Ref Rng & Units 10/04/2017 10/25/2013  Glucose 70 - 99 mg/dL 130(H) 120(H)  BUN 6 - 20 mg/dL 6 4(L)  Creatinine 0.44 - 1.00 mg/dL 0.78 0.59  Sodium 135 - 145 mmol/L 140 144  Potassium 3.5 - 5.1 mmol/L 2.8(LL) 4.0  Chloride 98 - 111 mmol/L 104 107  CO2 22 - 32 mmol/L 27 28  Calcium 8.9 - 10.3 mg/dL 8.4(L) 9.0  Total Protein 6.5 - 8.1 g/dL 6.2(L) 6.1  Total Bilirubin 0.3 - 1.2 mg/dL <0.2(L) 0.2  Alkaline Phos 38 - 126 U/L 187(H) 82  AST 15 - 41 U/L 31 18  ALT 0 - 44 U/L 100(H) 20   Pathology  09/20/2017 Breast Biopsy Diagnosis Soft Tissue Needle Core Biopsy, inferior  medial to IMF - ADENOCARCINOMA. - SEE MICROSCOPIC DESCRIPTION. Microscopic Comment Immunohistochemistry will be performed and reported as an addendum. (JDP:ah 09/23/17) ADDENDUM: Immunohistochemistry shows the tumor is strongly positive with cytokeratin AE1/AE3, cytokeratin 7 and shows moderate weak positivity with CDX-2. The tumor is negative with estrogen receptor, progesterone receptor, GCDFP, GATA-3,  Napsin A, TTF-1, WT-1 and cytokeratin 20. The immunophenotype is consistent with metastatic carcinoma. The combination of CDX-2 and cytokeratin 7 positivity raises the possibility of an upper gastrointestinal primary. Clinical correlation is essential.ical Information Specimen(s) Obtained: Soft Tissue Needle Core Biopsy, inferior medial to IMF Specimen Clinical Information not definitely in the breast palpable mass x 1 month; ? lymph node r/o malignancy Gross Received in two containers (one with formalin, one with saline) are three cores of gray-white to dark soft firm tissue (one core in saline, two in formalin) which range from 1 x 0.1 cm to 1.3 x 0.1 cm. The core from saline is submitted in RPMI for possible flow cytometry, and two cores from formalin are submitted in one block for routine histology.     PROCEDURES  Upper Endoscopy by Dr. Silverio Decamp 10/10/17  IMPRESSION - Normal esophagus. - Z-line regular, 35 cm from the incisors. - Gastric bypass with a normal-sized pouch and intact staple line. Gastrojejunal anastomosis characterized by healthy appearing mucosa. - No specimens collected.    RADIOGRAPHIC STUDIES: I have personally reviewed the radiological images as listed and agreed with the findings in the report. No results found.   ASSESSMENT & PLAN: Dazaria Macneill is a 52 y.o. female with history of bipolar II and lumbosacral radiculitis.   1. 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 MSI-stable PD-L1 0% KRAS mutation  2. New onset frontal  headache; MRI brain 10/14/17 negative for metastatic disease but shows C3-C4 disc and endplate degeneration 3. Anxiety and depression 4. GERD 5. Chronic back pain 6. Low appetite and nausea 7. Support   Ms. Cephus is doing well today. Her performance status remains low but improving. Respiratory function also improved on prednisone. I refilled for her to continue 40 mg daily, and ultimately gradually wean her as her respiratory function continues to improve.   The patient was seen with Dr. Burr Medico who reviewed foundation one testing which shows KRAS mutation, otherwise no actionable mutations. Dr. Burr Medico reviewed the standard treatment is chemotherapy, consisting of paclitaxel and carboplatin. She recommends to add biological agent bevacizumab. Although PD-L1 expression is 0%, immunotherapy is still indicated; Dr. Burr Medico recommends Huey Bienenstock; she would receive this combination of therapy on day 1 every 21 days. Side effects including but not not limited to fatigue, nausea, vomiting, diarrhea, skin rash, hair loss, neuropathy, fluid retention, pneumonitis, renal and kidney dysfunction, neutropenic fever, need for blood transfusion, bleeding, were discussed with patient in great detail. She agrees to proceed.   She will complete palliative radiation next week, then will give her a few days to recover. Plan to begin chemo in 2 weeks. She will get PAC placement and attend chemo education class in the meantime.   The patient appears to be coping well with her overall condition, but her mother is having a hard time. I referred her to social work for emotional support.   PLAN: -FO reviewed -PAC placement and chemo edu class next week  -Return for lab, flush, f/u, and carbo/taxol/tecentriq/avastin in 2 weeks -Refilled prednisone, xanax, and fiorcet  -Referral to social work for family support   Orders Placed This Encounter  Procedures  . IR Fluoro Guide CV Line Right    Indicate type of CVC ordering     Standing Status:   Future    Standing Expiration Date:   12/29/2018    Order Specific Question:   Reason for exam:    Answer:   [facility] for chemothearpy    Order  Specific Question:   Is the patient pregnant?    Answer:   No    Order Specific Question:   Preferred Imaging Location?    Answer:   [facility]  . Ambulatory referral to Social Work    Referral Priority:   Routine    Referral Type:   Consultation    Referral Reason:   Specialty Services Required    Number of Visits Requested:   1   All questions were answered. The patient knows to call the clinic with any problems, questions or concerns. No barriers to learning was detected. I spent 20 minutes counseling the patient face to face. The total time spent in the appointment was 25 minutes and more than 50% was on counseling and review of test results     Alla Feeling, NP 10/28/17    Addendum  I have seen the patient, examined her. I agree with the assessment and and plan and have edited the notes.   Ziara is tolerating thoracic radiation well, has not noticed any change of her dyspnea. No other new symptoms, able to function at home. She is worried about her mother who is under a lot of stress due to her cancer diagnosis.  We will refer her to our Education officer, museum.  I discussed her foundation one genomic test results. Unfortunately, her tumor contains K-ras mutation, no other actionable mutation.  The PDL 1 expression in her tumor was 0%.  We discussed systemic treatment options after radiation, including chemotherapy, EGFR inhibitor bevacizumab and immunotherapy. The IMpower 150 trial demonstrated additional survival benefit of adding atezolizumab to chemo (carbo and paclitaxel) and bevacizumeb, including PDL 1 (-) group, although the benefit is much smaller than PD-L1 (+) subgroup. Given her young age, preserved PS, no contraindication to beva, I recommend her to consider this regimen, to start in a week after chemo (hope  radiation may boost her immune response in her tumor).  Potential benefits and side effects discussed with patient and her mother in great detail, she agrees to proceed.  When I stepped out of the room, patient came out and asked me about her disease progmosos, which she does not want to talk in front of her mother.  We discussed the median survival in stage IV NSCLC, with and without treatment.  I also encouraged her to consider living well, medical power of attorney, etc.  She is realistic and appreciated the discussion.  Truitt Merle  10/28/2017

## 2017-10-28 NOTE — Telephone Encounter (Signed)
Chemo education scheduled/ no treatment plan/ unable to schedule per 8/9 los

## 2017-10-28 NOTE — Progress Notes (Signed)
START ON PATHWAY REGIMEN - Non-Small Cell Lung     A cycle is every 21 days:     Atezolizumab      Bevacizumab-xxxx      Paclitaxel      Carboplatin   **Always confirm dose/schedule in your pharmacy ordering system**  Patient Characteristics: Stage IV Metastatic, Nonsquamous, Initial Chemotherapy/Immunotherapy, PS = 0, 1, ALK Translocation Negative/Unknown and EGFR Mutation Negative/Non-Sensitizing/Unknown, PD-L1 Expression Positive 1-49% (TPS) / Negative / Not Tested / Awaiting Test Results  and Immunotherapy Candidate AJCC T Category: T3 Current Disease Status: Distant Metastases AJCC N Category: N1 AJCC M Category: M1b AJCC 8 Stage Grouping: IVA Histology: Nonsquamous Cell ROS1 Rearrangement Status: Negative T790M Mutation Status: Not Applicable - EGFR Mutation Negative/Unknown Other Mutations/Biomarkers: No Other Actionable Mutations NTRK Gene Fusion Status: Negative PD-L1 Expression Status: PD-L1 Negative Chemotherapy/Immunotherapy LOT: Initial Chemotherapy/Immunotherapy Molecular Targeted Therapy: Not Appropriate ALK Translocation Status: Negative EGFR Mutation Status: Negative/Wild Type BRAF V600E Mutation Status: Negative Performance Status: PS = 0, 1 Immunotherapy Candidate Status: Candidate for Immunotherapy Intent of Therapy: Non-Curative / Palliative Intent, Discussed with Patient

## 2017-10-31 ENCOUNTER — Ambulatory Visit
Admission: RE | Admit: 2017-10-31 | Discharge: 2017-10-31 | Disposition: A | Discharge status: Home / Self Care | Payer: No Typology Code available for payment source | Source: Ambulatory Visit | Attending: Radiation Oncology | Admitting: Radiation Oncology

## 2017-10-31 ENCOUNTER — Encounter: Payer: Self-pay | Admitting: Nurse Practitioner

## 2017-10-31 ENCOUNTER — Encounter (HOSPITAL_COMMUNITY): Payer: Self-pay | Admitting: Hematology

## 2017-10-31 DIAGNOSIS — Z510 Encounter for antineoplastic radiation therapy: Secondary | ICD-10-CM | POA: Diagnosis not present

## 2017-11-01 ENCOUNTER — Telehealth: Payer: Self-pay | Admitting: Internal Medicine

## 2017-11-01 ENCOUNTER — Encounter: Payer: Self-pay | Admitting: *Deleted

## 2017-11-01 ENCOUNTER — Encounter: Payer: Self-pay | Admitting: General Practice

## 2017-11-01 ENCOUNTER — Ambulatory Visit
Admission: RE | Admit: 2017-11-01 | Discharge: 2017-11-01 | Disposition: A | Discharge status: Home / Self Care | Payer: No Typology Code available for payment source | Source: Ambulatory Visit | Attending: Radiation Oncology | Admitting: Radiation Oncology

## 2017-11-01 ENCOUNTER — Other Ambulatory Visit: Payer: Self-pay | Admitting: Hematology

## 2017-11-01 DIAGNOSIS — Z510 Encounter for antineoplastic radiation therapy: Secondary | ICD-10-CM | POA: Diagnosis not present

## 2017-11-01 NOTE — Progress Notes (Signed)
[facility] Psychosocial Distress Screening Clinical Social Work  Clinical Social Work was referred by distress screening protocol.  The patient scored a 5 on the Psychosocial Distress Thermometer which indicates moderate distress. Clinical Social Worker contacted patient by phone to assess for distress and other psychosocial needs. "Its really hard on my family, I know I am saved."  Stress comes from seeing impact of diagnosis and treatment on mother and sister. Another sister died from cancer several years ago, death was unexpected.  Patient aware that her disease is quite serious, verbalizes that she expects to die.  Concerned about impact on sister and mother as they will witness her decline.  Family has resources for support including friends and faith community.  CSW will send information packet on additional resources available in [facility] for patient and family support.  Encouraged family to connect w CSW and [employer] resources as needed.     ONCBCN DISTRESS SCREENING 10/20/2017  Screening Type Initial Screening  Distress experienced in past week (1-10) 5  Practical problem type Insurance  Physical Problem type Pain;Breathing;Loss of appetitie  Physician notified of physical symptoms Yes  Referral to clinical psychology No  Referral to clinical social work Yes  Referral to dietition No  Referral to financial advocate No  Referral to support programs No  Referral to palliative care No    Clinical Social Worker follow up needed: No.  If yes, follow up plan:  Patient and family encouraged to call and access resources as needed.    Beverely Pace, [place], LCSW Clinical Social Worker Phone:  [phone]

## 2017-11-01 NOTE — Progress Notes (Signed)
Oncology Nurse Navigator Documentation  Oncology Nurse Navigator Flowsheets 11/01/2017  Navigator Location [facility]  Navigator Encounter Type Other/I followed up on Yvonne Lang schedule. She has not been scheduled for chemo yet.  I contacted scheduling to help facilitate.   Abnormal Finding Date 09/20/2017  Confirmed Diagnosis Date 09/23/2017  Treatment Initiated Date 10/20/2017  Treatment Phase Treatment  Barriers/Navigation Needs Coordination of Care  Interventions Coordination of Care  Coordination of Care Other  Time Spent with Patient 30

## 2017-11-01 NOTE — Telephone Encounter (Signed)
Appointments scheduled AVS/Calendar printed per 8/13 los

## 2017-11-02 ENCOUNTER — Ambulatory Visit
Admission: RE | Admit: 2017-11-02 | Discharge: 2017-11-02 | Disposition: A | Discharge status: Home / Self Care | Payer: No Typology Code available for payment source | Source: Ambulatory Visit | Attending: Radiation Oncology | Admitting: Radiation Oncology

## 2017-11-02 DIAGNOSIS — Z510 Encounter for antineoplastic radiation therapy: Secondary | ICD-10-CM | POA: Diagnosis not present

## 2017-11-03 ENCOUNTER — Ambulatory Visit
Admission: RE | Admit: 2017-11-03 | Discharge: 2017-11-03 | Disposition: A | Discharge status: Home / Self Care | Payer: No Typology Code available for payment source | Source: Ambulatory Visit | Attending: Radiation Oncology | Admitting: Radiation Oncology

## 2017-11-03 ENCOUNTER — Telehealth: Payer: Self-pay

## 2017-11-03 ENCOUNTER — Inpatient Hospital Stay: Payer: No Typology Code available for payment source

## 2017-11-03 ENCOUNTER — Other Ambulatory Visit: Payer: Self-pay

## 2017-11-03 DIAGNOSIS — Z510 Encounter for antineoplastic radiation therapy: Secondary | ICD-10-CM | POA: Diagnosis not present

## 2017-11-03 NOTE — Telephone Encounter (Signed)
Left voice message at 14:40 for Tiffany in IR checking on scheduling this patient for port placement.

## 2017-11-04 ENCOUNTER — Ambulatory Visit
Admission: RE | Admit: 2017-11-04 | Discharge: 2017-11-04 | Disposition: A | Discharge status: Home / Self Care | Payer: No Typology Code available for payment source | Source: Ambulatory Visit | Attending: Radiation Oncology | Admitting: Radiation Oncology

## 2017-11-04 ENCOUNTER — Encounter: Payer: Self-pay | Admitting: Radiation Oncology

## 2017-11-04 ENCOUNTER — Telehealth: Payer: Self-pay | Admitting: *Deleted

## 2017-11-04 DIAGNOSIS — Z510 Encounter for antineoplastic radiation therapy: Secondary | ICD-10-CM | POA: Diagnosis not present

## 2017-11-04 NOTE — Progress Notes (Signed)
  Radiation Oncology         [phone]) [phone] ________________________________  Name: Yvonne Lang MRN: [id number]  Date: 11/04/2017  DOB: 07/05/1965  End of Treatment Note  Diagnosis:   52 y.o. woman with metastatic non-small cell lung cancer, stage IV (cT3, cN1, cM1b)    Indication for treatment:  Palliative       Radiation treatment dates:   10/24/2017 to 11/04/2017  Site/dose:   The Right lung was treated to 30 Gy in 10 fractions of 3 Gy.  Beams/energy:   3D Plan // 10X, 15X, 6X  Narrative: The patient tolerated radiation treatment relatively well.  Patient states that she has pain on her right side and in her chest area. States that she has moderate to severe fatigue. Denies any difficulty with swallowing. States that she gets shortness of breath with activity. States that she has a dry cough. States that she starts chemotherapy next week with Dr. Burr Medico.   Plan: The patient has completed radiation treatment. The patient will return to radiation oncology clinic for routine followup in one month. I advised her to call or return sooner if she has any questions or concerns related to her recovery or treatment. ________________________________  Sheral Apley. Tammi Klippel, M.D.   This document serves as a record of services personally performed by [name], MD. It was created on his behalf by [name], a trained medical scribe. The creation of this record is based on the scribe's personal observations and the provider's statements to them. This document has been checked and approved by the attending provider.

## 2017-11-04 NOTE — Telephone Encounter (Signed)
Spoke with Anderson Malta in IR about port placement before 11/09/17 - pt to start first chemo that day.  Per Anderson Malta, there is no availability until  11/14/17.   Pt is scheduled for port placement on 8/26 at present.   Tiffany in IR will contact pt with detailed instructions on Monday  11/07/17. Dr. Burr Medico notified.   OK to start First chemo cycle  Peripherally as per md.

## 2017-11-04 NOTE — Telephone Encounter (Signed)
Attempted to call IR several times without answers.  Left message on voice mail x 3  Requesting a call back to nurse about port placement.  Awaiting call back from IR dept.

## 2017-11-06 ENCOUNTER — Ambulatory Visit: Admission: RE | Admit: 2017-11-06 | Payer: No Typology Code available for payment source | Source: Ambulatory Visit

## 2017-11-07 ENCOUNTER — Encounter: Payer: Self-pay | Admitting: Hematology

## 2017-11-07 ENCOUNTER — Encounter: Payer: Self-pay | Admitting: Pharmacist

## 2017-11-08 ENCOUNTER — Telehealth: Payer: Self-pay | Admitting: *Deleted

## 2017-11-08 NOTE — Telephone Encounter (Signed)
Oncology Nurse Navigator Documentation  Oncology Nurse Navigator Flowsheets 11/08/2017  Navigator Location [facility]  Navigator Encounter Type Telephone;Other/Dr. Burr Medico updated me that PET is authorized. I called to schedule at [facility].  I received appt.  I called patient to update. I was unable to reach her but did leave a vm message of appt and pre-procedure instructions.  I also left my name and phone number to call if needed for clarification.   Telephone Outgoing Call  Barriers/Navigation Needs Education;Coordination of Care  Education Other  Interventions Coordination of Care;Education  Coordination of Care Other  Education Method Verbal  Acuity Level 2  Time Spent with Patient 46

## 2017-11-08 NOTE — Progress Notes (Signed)
Yvonne Lang  [phone] [phone]  Clinic Follow up Note   Patient Care Team: Redmond School, MD as PCP - General (Internal Medicine) 11/09/2017  SUMMARY OF ONCOLOGIC HISTORY: Oncology History   Cancer Staging Metastatic non-small cell lung cancer [facility]) Staging form: Lung, AJCC 8th Edition - Clinical stage from 10/17/2017: Stage IV (cT3, cN1, cM1b) - Signed by Truitt Merle, MD on 10/17/2017       Metastatic non-small cell lung cancer ([place])   09/20/2017 Imaging    US Breast Left 09/20/17  IMPRESSION Indeterminte palpable mass measuring 3.2x1.7x2.8 cm  inferior medial to the inframammary fold of the left breast.      09/20/2017 Initial Biopsy    Diagnosis 09/20/17 Soft Tissue Needle Core Biopsy, inferior medial to IMF - ADENOCARCINOMA. - SEE MICROSCOPIC DESCRIPTION.  Microscopic Comment Immunohistochemistry will be performed and reported as an addendum. (JDP:ah 09/23/17) ADDENDUM: Immunohistochemistry shows the tumor is strongly positive with cytokeratin AE1/AE3, cytokeratin 7 and shows moderate weak positivity with CDX-2. The tumor is negative with estrogen receptor, progesterone receptor, GCDFP, GATA-3, Napsin A, TTF-1, WT-1 and cytokeratin 20. The immunophenotype is consistent with metastatic carcinoma. The combination of CDX-2 and cytokeratin 7 positivity raises the possibility of an upper gastrointestinal primary. Clinical correlation is essential.    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 Silverio Decamp 10/10/17  IMPRESSION - Normal esophagus. - Z-line regular, 35 cm from the incisors. - Gastric bypass with a normal-sized pouch and intact staple line. Gastrojejunal anastomosis characterized by healthy appearing mucosa. - No specimens collected.    10/12/2017 Imaging    CT CAP WO Contrast 10/12/17  IMPRESSION: 7 cm central  right lung mass involving the right hilum, with postobstructive collapse of the right middle lobe, highly suspicious for primary bronchogenic carcinoma. 5 cm masslike opacity in the superior segment of the right lower lobe may represent carcinoma or postobstructive pneumonitis. 3.3 cm soft tissue mass in the lower anterior chest wall soft tissues, suspicious for metastatic disease. No evidence of abdominal or pelvic metastatic disease.     10/14/2017 Imaging    MRI Brain 10/14/17  IMPRESSION: 1. No metastatic disease or acute intracranial abnormality. Normal MRI appearance of the brain. 2. 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      10/17/2017 Cancer Staging    Staging form: Lung, AJCC 8th Edition - Clinical stage from 10/17/2017: Stage IV (cT3, cN1, cM1b) - Signed by Truitt Merle, MD on 10/17/2017    11/08/2017 -  Chemotherapy    The patient had palonosetron (ALOXI) injection 0.25 mg, 0.25 mg, Intravenous,  Once, 1 of 4 cycles pegfilgrastim-cbqv (UDENYCA) injection 6 mg, 6 mg, Subcutaneous, Once, 1 of 4 cycles CARBOplatin (PARAPLATIN) 540 mg in sodium chloride 0.9 % 250 mL chemo infusion, 540 mg (100 % of original dose 543.2 mg), Intravenous,  Once, 1 of 4 cycles Dose modification:   (original dose 543.2 mg, Cycle 1) PACLitaxel (TAXOL) 306 mg in sodium chloride 0.9 % 500 mL chemo infusion (> 27m/m2), 150 mg/m2 = 306 mg (100 % of original dose 150 mg/m2), Intravenous,  Once, 1 of 4 cycles Dose modification: 175 mg/m2 (original dose 150 mg/m2, Cycle 1, Reason: Provider Judgment), 150 mg/m2 (original dose 150 mg/m2, Cycle 1, Reason: Provider Judgment) fosaprepitant (EMEND) 150 mg, dexamethasone (DECADRON) 12 mg in sodium chloride 0.9 % 145 mL IVPB, , Intravenous,  Once, 1  of 4 cycles atezolizumab (TECENTRIQ) 1,200 mg in sodium chloride 0.9 % 250 mL chemo infusion, 1,200 mg, Intravenous, Once, 1 of 4 cycles  for chemotherapy treatment.      INTERVAL HISTORY: Yvonne Lang  returns for follow up and to begin chemotherapy as scheduled. She completed radiation per Dr. Tammi Klippel on 11/04/17. She tolerated well other than moderate fatigue. Respiratory function is stable. She tried to wean to 30 mg prednisone but had increased dyspnea and went back up to 40 mg daily. Continues to have dry cough which is improving, no hemoptysis. Right lung pain is stable, controlled on tramadol. Lower chest nodule is still painful and she feels it is slightly enlarged. Dyspnea on exertion is improving on prednisone. She does not require supplemental O2. She performs ADLs at home and goes to church and grocery store. Continues to have daily headache, stable. Mirtazapine is helping appetite. She broke a tooth over the weekend and had it extracted 8/19, minimal bleeding and pain during the procedure but has recovered. PAC pending on 8/26.   REVIEW OF SYSTEMS:   Constitutional: Denies fevers, chills or abnormal weight loss (+) fatigue  Eyes: Denies blurriness of vision Ears, nose, mouth, throat, and face: Denies mucositis or sore throat (+) tooth extraction 8/19  Respiratory: Denies hemoptysis or wheezes (+) dry cough, improving (+) right lung pain stable (+) DOE, stable  Cardiovascular: Denies palpitation, chest discomfort or lower extremity swelling (+) painful left chest nodule  Gastrointestinal:  Denies nausea, vomiting, constipation, diarrhea, hematochezia, heartburn or change in bowel habits Skin: Denies abnormal skin rashes Lymphatics: Denies new lymphadenopathy or easy bruising Neurological:Denies numbness, tingling or new weaknesses Behavioral/Psych: Mood is stable, no new changes  All other systems were reviewed with the patient and are negative.  MEDICAL HISTORY:  Past Medical History:  Diagnosis Date  . Anxiety   . Bronchitis   . COPD (chronic obstructive pulmonary disease) ([place])   . Depression   . GERD (gastroesophageal reflux disease)   . H/O gastric bypass    1999  .  Irritable bowel syndrome   . Lung cancer [facility])     SURGICAL HISTORY: Past Surgical History:  Procedure Laterality Date  . BACK SURGERY    . CHOLECYSTECTOMY    . COLON SURGERY     for a kink in her colon and 12 inchs removed  . COLONOSCOPY N/A 12/13/2013   Procedure: COLONOSCOPY;  Surgeon: Rogene Houston, MD;  Location: [facility] ENDO SUITE;  Service: Endoscopy;  Laterality: N/A;  200  . ESOPHAGOGASTRODUODENOSCOPY N/A 12/13/2013   Procedure: ESOPHAGOGASTRODUODENOSCOPY (EGD);  Surgeon: Rogene Houston, MD;  Location: [facility] ENDO SUITE;  Service: Endoscopy;  Laterality: N/A;  . Gastric Bypas  2000    I have reviewed the social history and family history with the patient and they are unchanged from previous note.  ALLERGIES:  is allergic to codeine.  MEDICATIONS:  Current Outpatient Medications  Medication Sig Dispense Refill  . albuterol (PROVENTIL HFA) 108 (90 Base) MCG/ACT inhaler Inhale 2 puffs into the lungs 2 (two) times daily.     Marland Kitchen albuterol (PROVENTIL) 4 MG tablet Take 4 mg by mouth 2 (two) times daily.     Marland Kitchen ALPRAZolam (XANAX) 0.25 MG tablet Take 1 tablet (0.25 mg total) by mouth at bedtime as needed for anxiety. 30 tablet 0  . Ascorbic Acid (VITAMIN C) 100 MG tablet Take 100 mg by mouth daily.    Marland Kitchen aspirin 81 MG tablet Take 81 mg by mouth daily.    Marland Kitchen  butalbital-acetaminophen-caffeine (FIORICET, ESGIC) 50-325-40 MG tablet Take 1-2 tablets by mouth every 6 (six) hours as needed for headache. 40 tablet 1  . CALCIUM PO Take by mouth daily.    Marland Kitchen dicyclomine (BENTYL) 20 MG tablet Take 1 tablet (20 mg total) by mouth every 6 (six) hours. 60 tablet 1  . DULoxetine (CYMBALTA) 60 MG capsule Take 60 mg by mouth daily.    Marland Kitchen estradiol (ESTRACE) 2 MG tablet Take 1 tablet (2 mg total) by mouth daily. 30 tablet 6  . gabapentin (NEURONTIN) 300 MG capsule TAKE 1 CAPSULE BY MOUTH 4 TIMES DAILY  4  . methocarbamol (ROBAXIN) 750 MG tablet Take 750 mg by mouth 4 (four) times daily.    . mirtazapine  (REMERON) 7.5 MG tablet Take 1 tablet (7.5 mg total) by mouth at bedtime. 30 tablet 0  . Multiple Vitamin (MULTIVITAMIN) tablet Take 1 tablet by mouth daily.    . Omega-3 Fatty Acids (FISH OIL) 600 MG CAPS Take 600 mg by mouth daily.     Marland Kitchen omeprazole (PRILOSEC) 20 MG capsule Take 1 capsule (20 mg total) by mouth 2 (two) times daily before a meal. 60 capsule 0  . ondansetron (ZOFRAN) 8 MG tablet Take 1 tablet (8 mg total) by mouth every 8 (eight) hours as needed for nausea or vomiting. 30 tablet 1  . potassium chloride SA (K-DUR,KLOR-CON) 20 MEQ tablet Take 1 tablet (20 mEq total) by mouth 2 (two) times daily. 60 tablet 0  . predniSONE (DELTASONE) 10 MG tablet Take 4 tablets (40 mg total) by mouth daily with breakfast. 56 tablet 0  . Probiotic Product (PROBIOTIC-10 PO) Take by mouth.    . prochlorperazine (COMPAZINE) 10 MG tablet Take 1 tablet (10 mg total) by mouth every 6 (six) hours as needed for nausea or vomiting. 30 tablet 0  . progesterone (PROMETRIUM) 200 MG capsule Take 1 daily at HS 30 capsule 6  . RaNITidine HCl (ZANTAC PO) Take by mouth 3 (three) times daily.    Marland Kitchen rOPINIRole (REQUIP) 0.5 MG tablet TAKE 1 TABLET BY MOUTH AT BEDTIME FOR 30 DAYS  11  . sucralfate (CARAFATE) 1 g tablet Take 1 tablet (1 g total) by mouth 4 (four) times daily. 120 tablet 1  . traMADol (ULTRAM) 50 MG tablet Take 1 tablet (50 mg total) by mouth every 6 (six) hours as needed. 90 tablet 0  . TRELEGY ELLIPTA 100-62.5-25 MCG/INH AEPB INHALE 1 PUFF INTO THE LUNGS EVERY DAY  5  . UNABLE TO FIND Inhale into the lungs. Med Name: Trilogy as needed     No current facility-administered medications for this visit.    Facility-Administered Medications Ordered in Other Visits  Medication Dose Route Frequency Provider Last Rate Last Dose  . CARBOplatin (PARAPLATIN) 540 mg in sodium chloride 0.9 % 250 mL chemo infusion  540 mg Intravenous Once Truitt Merle, MD        PHYSICAL EXAMINATION: ECOG PERFORMANCE STATUS: 2 -  Symptomatic, <50% confined to bed  Vitals:   11/09/17 0853  BP: 111/80  Pulse: (!) 102  Resp: 18  Temp: 98 F (36.7 C)  SpO2: 100%   Filed Weights   11/09/17 0853  Weight: 192 lb 3.2 oz (87.2 kg)    GENERAL:alert, no distress and comfortable SKIN: skin color, texture, turgor are normal, no rashes or significant lesions EYES: sclera clear OROPHARYNX:no thrush or ulcers  LYMPH:  no palpable cervical or supraclavicular lymphadenopathy LUNGS: mild wheezing with normal breathing effort HEART: tachycardic, regular  rhythm; no lower extremity edema ABDOMEN:abdomen soft, non-tender and normal bowel sounds Musculoskeletal:no cyanosis of digits and no clubbing  NEURO: alert & oriented x 3 with fluent speech, no focal motor/sensory deficits Chest percutaneous mass measures 4 cm, tender   LABORATORY DATA:  I have reviewed the data as listed CBC Latest Ref Rng & Units 11/09/2017 10/04/2017 10/25/2013  WBC 3.9 - 10.3 K/uL 12.5(H) 19.0(H) 8.8  Hemoglobin 11.6 - 15.9 g/dL 11.2(L) 12.2 12.1  Hematocrit 34.8 - 46.6 % 34.7(L) 38.1 36.3  Platelets 145 - 400 K/uL 634(H) 538(H) 361     CMP Latest Ref Rng & Units 11/09/2017 10/04/2017 10/25/2013  Glucose 70 - 99 mg/dL 94 130(H) 120(H)  BUN 6 - 20 mg/dL 7 6 4(L)  Creatinine 0.44 - 1.00 mg/dL 0.60 0.78 0.59  Sodium 135 - 145 mmol/L 136 140 144  Potassium 3.5 - 5.1 mmol/L 3.5 2.8(LL) 4.0  Chloride 98 - 111 mmol/L 99 104 107  CO2 22 - 32 mmol/L _0 Calcium 8.9 - 10.3 mg/dL 8.2(L) 8.4(L) 9.0  Total Protein 6.5 - 8.1 g/dL 6.6 6.2(L) 6.1  Total Bilirubin 0.3 - 1.2 mg/dL <0.2(L) <0.2(L) 0.2  Alkaline Phos 38 - 126 U/L 126 187(H) 82  AST 15 - 41 U/L 33 31 18  ALT 0 - 44 U/L 29 100(H) 20      RADIOGRAPHIC STUDIES: I have personally reviewed the radiological images as listed and agreed with the findings in the report. No results found.   ASSESSMENT & PLAN:  Yvonne Lang a 52 y.o.femalewith history of bipolar II and lumbosacral  radiculitis.   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 New onset frontal headache; MRI brain 10/14/17 negative for metastatic disease but shows C3-C4 disc and endplate degeneration 3. Anxiety and depression 4. GERD 5. Chronic back pain 6. Low appetite and nausea 7. Support   Yvonne Lang appears stable. She completed radiation on 11/04/17. She tolerated well with moderate fatigue. Her respiratory function is stable to slightly improved. She did not tolerate prednisone taper, she remains on 40 mg daily. Will re-attempt to taper in the near future. Labs reviewed. Leukocytosis likely related to corticosteroids. She has thrombocytosis, likely reactive. Iron studies are normal. Labs adequate to proceed with cycle 1, will dose-reduce carboplatin to AUC 4 and taxol to 150 mg/m2 for cycle 1. She will receive tecentriq. Will hold avastin with cycle 1 due to upcoming port placement on 8/26. She will return for Udenyca on 8/23. I will see her back in 1 week for toxicity check and possible supportive care.   PLAN: -Labs reviewed, proceed with cycle 1 dose-reduced carboplatin AUC 4, taxol 150 mg/m2, tecentriq (hold avastin for upcoming PAC placement) -Return for lab, f/u with me in 1 week, and possible IVF  -PAC placement 8/26   Orders Placed This Encounter  Procedures  . Iron and TIBC    Standing Status:   Future    Number of Occurrences:   1    Standing Expiration Date:   11/10/2018  . Ferritin    Standing Status:   Future    Number of Occurrences:   1    Standing Expiration Date:   11/10/2018   All questions were answered. The patient knows to call the clinic with any problems, questions or concerns. No barriers to learning was detected. I spent 20 minutes counseling the patient face to face. The total time spent in the appointment was 25 minutes and  more than 50% was on counseling and review of test results     Alla Feeling,  NP 11/09/17

## 2017-11-09 ENCOUNTER — Encounter: Payer: Self-pay | Admitting: Nurse Practitioner

## 2017-11-09 ENCOUNTER — Inpatient Hospital Stay (HOSPITAL_BASED_OUTPATIENT_CLINIC_OR_DEPARTMENT_OTHER): Payer: No Typology Code available for payment source | Admitting: Nurse Practitioner

## 2017-11-09 ENCOUNTER — Inpatient Hospital Stay: Payer: No Typology Code available for payment source

## 2017-11-09 ENCOUNTER — Other Ambulatory Visit: Payer: 59

## 2017-11-09 VITALS — BP 104/71 | HR 87 | Temp 98.3°F | Resp 16

## 2017-11-09 VITALS — BP 111/80 | HR 102 | Temp 98.0°F | Resp 18 | Ht 67.0 in | Wt 192.2 lb

## 2017-11-09 DIAGNOSIS — R05 Cough: Secondary | ICD-10-CM

## 2017-11-09 DIAGNOSIS — D72829 Elevated white blood cell count, unspecified: Secondary | ICD-10-CM

## 2017-11-09 DIAGNOSIS — F3181 Bipolar II disorder: Secondary | ICD-10-CM

## 2017-11-09 DIAGNOSIS — M549 Dorsalgia, unspecified: Secondary | ICD-10-CM

## 2017-11-09 DIAGNOSIS — Z9884 Bariatric surgery status: Secondary | ICD-10-CM

## 2017-11-09 DIAGNOSIS — K219 Gastro-esophageal reflux disease without esophagitis: Secondary | ICD-10-CM

## 2017-11-09 DIAGNOSIS — Z79899 Other long term (current) drug therapy: Secondary | ICD-10-CM

## 2017-11-09 DIAGNOSIS — M5417 Radiculopathy, lumbosacral region: Secondary | ICD-10-CM

## 2017-11-09 DIAGNOSIS — F419 Anxiety disorder, unspecified: Secondary | ICD-10-CM

## 2017-11-09 DIAGNOSIS — C3490 Malignant neoplasm of unspecified part of unspecified bronchus or lung: Secondary | ICD-10-CM

## 2017-11-09 DIAGNOSIS — G8929 Other chronic pain: Secondary | ICD-10-CM

## 2017-11-09 DIAGNOSIS — C3431 Malignant neoplasm of lower lobe, right bronchus or lung: Secondary | ICD-10-CM | POA: Diagnosis not present

## 2017-11-09 DIAGNOSIS — R51 Headache: Secondary | ICD-10-CM

## 2017-11-09 DIAGNOSIS — C7989 Secondary malignant neoplasm of other specified sites: Secondary | ICD-10-CM | POA: Diagnosis not present

## 2017-11-09 DIAGNOSIS — Z7982 Long term (current) use of aspirin: Secondary | ICD-10-CM

## 2017-11-09 DIAGNOSIS — Z923 Personal history of irradiation: Secondary | ICD-10-CM

## 2017-11-09 DIAGNOSIS — R530 Neoplastic (malignant) related fatigue: Secondary | ICD-10-CM

## 2017-11-09 LAB — CBC WITH DIFFERENTIAL (CANCER CENTER ONLY)
BASOS ABS: 0.1 10*3/uL (ref 0.0–0.1)
BASOS PCT: 0 %
EOS PCT: 0 %
Eosinophils Absolute: 0 10*3/uL (ref 0.0–0.5)
HEMATOCRIT: 34.7 % — AB (ref 34.8–46.6)
Hemoglobin: 11.2 g/dL — ABNORMAL LOW (ref 11.6–15.9)
Lymphocytes Relative: 5 %
Lymphs Abs: 0.6 10*3/uL — ABNORMAL LOW (ref 0.9–3.3)
MCH: 27.8 pg (ref 25.1–34.0)
MCHC: 32.3 g/dL (ref 31.5–36.0)
MCV: 86.1 fL (ref 79.5–101.0)
MONO ABS: 0.4 10*3/uL (ref 0.1–0.9)
Monocytes Relative: 4 %
NEUTROS ABS: 11.4 10*3/uL — AB (ref 1.5–6.5)
Neutrophils Relative %: 91 %
PLATELETS: 634 10*3/uL — AB (ref 145–400)
RBC: 4.02 MIL/uL (ref 3.70–5.45)
RDW: 14.5 % (ref 11.2–14.5)
WBC: 12.5 10*3/uL — AB (ref 3.9–10.3)

## 2017-11-09 LAB — IRON AND TIBC
Iron: 71 ug/dL (ref 41–142)
Saturation Ratios: 28 % (ref 21–57)
TIBC: 249 ug/dL (ref 236–444)
UIBC: 178 ug/dL

## 2017-11-09 LAB — CMP (CANCER CENTER ONLY)
ALT: 29 U/L (ref 0–44)
AST: 33 U/L (ref 15–41)
Albumin: 2.6 g/dL — ABNORMAL LOW (ref 3.5–5.0)
Alkaline Phosphatase: 126 U/L (ref 38–126)
Anion gap: 10 (ref 5–15)
BUN: 7 mg/dL (ref 6–20)
CO2: 27 mmol/L (ref 22–32)
Calcium: 8.2 mg/dL — ABNORMAL LOW (ref 8.9–10.3)
Chloride: 99 mmol/L (ref 98–111)
Creatinine: 0.6 mg/dL (ref 0.44–1.00)
GFR, Est AFR Am: >60 mL/min (ref >60)
GFR, Estimated: >60 mL/min (ref >60)
Glucose, Bld: 94 mg/dL (ref 70–99)
Potassium: 3.5 mmol/L (ref 3.5–5.1)
Sodium: 136 mmol/L (ref 135–145)
Total Bilirubin: <0.2 mg/dL — ABNORMAL LOW (ref 0.3–1.2)
Total Protein: 6.6 g/dL (ref 6.5–8.1)

## 2017-11-09 LAB — FERRITIN: Ferritin: 128 ng/mL (ref 11–307)

## 2017-11-09 LAB — TSH: TSH: 0.607 u[IU]/mL (ref 0.308–3.960)

## 2017-11-09 LAB — TOTAL PROTEIN, URINE DIPSTICK: Protein, ur: NEGATIVE mg/dL

## 2017-11-09 MED ORDER — SODIUM CHLORIDE 0.9 % IV SOLN
543.2000 mg | Freq: Once | INTRAVENOUS | Status: AC
  Administered 2017-11-09: 540 mg via INTRAVENOUS
  Filled 2017-11-09: qty 54

## 2017-11-09 MED ORDER — DIPHENHYDRAMINE HCL 50 MG/ML IJ SOLN
50.0000 mg | Freq: Once | INTRAMUSCULAR | Status: AC
  Administered 2017-11-09: 50 mg via INTRAVENOUS

## 2017-11-09 MED ORDER — DIPHENHYDRAMINE HCL 50 MG/ML IJ SOLN
INTRAMUSCULAR | Status: AC
  Filled 2017-11-09: qty 1

## 2017-11-09 MED ORDER — SODIUM CHLORIDE 0.9 % IV SOLN
Freq: Once | INTRAVENOUS | Status: AC
  Administered 2017-11-09: 11:00:00 via INTRAVENOUS
  Filled 2017-11-09: qty 5

## 2017-11-09 MED ORDER — PALONOSETRON HCL INJECTION 0.25 MG/5ML
INTRAVENOUS | Status: AC
  Filled 2017-11-09: qty 5

## 2017-11-09 MED ORDER — SODIUM CHLORIDE 0.9 % IV SOLN
Freq: Once | INTRAVENOUS | Status: AC
  Administered 2017-11-09: 10:00:00 via INTRAVENOUS
  Filled 2017-11-09: qty 250

## 2017-11-09 MED ORDER — SODIUM CHLORIDE 0.9 % IV SOLN
1200.0000 mg | Freq: Once | INTRAVENOUS | Status: AC
  Administered 2017-11-09: 1200 mg via INTRAVENOUS
  Filled 2017-11-09: qty 20

## 2017-11-09 MED ORDER — FAMOTIDINE IN NACL 20-0.9 MG/50ML-% IV SOLN
20.0000 mg | Freq: Once | INTRAVENOUS | Status: AC
  Administered 2017-11-09: 20 mg via INTRAVENOUS

## 2017-11-09 MED ORDER — FAMOTIDINE IN NACL 20-0.9 MG/50ML-% IV SOLN
INTRAVENOUS | Status: AC
  Filled 2017-11-09: qty 50

## 2017-11-09 MED ORDER — SODIUM CHLORIDE 0.9 % IV SOLN
150.0000 mg/m2 | Freq: Once | INTRAVENOUS | Status: AC
  Administered 2017-11-09: 306 mg via INTRAVENOUS
  Filled 2017-11-09: qty 51

## 2017-11-09 MED ORDER — PALONOSETRON HCL INJECTION 0.25 MG/5ML
0.2500 mg | Freq: Once | INTRAVENOUS | Status: AC
  Administered 2017-11-09: 0.25 mg via INTRAVENOUS

## 2017-11-09 NOTE — Patient Instructions (Addendum)
[facility] Discharge Instructions for Patients Receiving Chemotherapy  Today you received the following chemotherapy agents atezolizumab Gildardo Pounds), paclitaxel (Taxol), carboplatin (Paraplatin)  To help prevent nausea and vomiting after your treatment, we encourage you to take your nausea medication as directed by your physician.    If you develop nausea and vomiting that is not controlled by your nausea medication, call the clinic.   BELOW ARE SYMPTOMS THAT SHOULD BE REPORTED IMMEDIATELY:  *FEVER GREATER THAN 100.5 F  *CHILLS WITH OR WITHOUT FEVER  NAUSEA AND VOMITING THAT IS NOT CONTROLLED WITH YOUR NAUSEA MEDICATION  *UNUSUAL SHORTNESS OF BREATH  *UNUSUAL BRUISING OR BLEEDING  TENDERNESS IN MOUTH AND THROAT WITH OR WITHOUT PRESENCE OF ULCERS  *URINARY PROBLEMS  *BOWEL PROBLEMS  UNUSUAL RASH Items with * indicate a potential emergency and should be followed up as soon as possible.  Feel free to call the clinic should you have any questions or concerns. The clinic phone number is [phone].  Please show the [facility] at check-in to the Emergency Department and triage nurse.  Atezolizumab injection What is this medicine? ATEZOLIZUMAB (a te zoe LIZ ue mab) is a monoclonal antibody. It is used to treat bladder cancer (urothelial cancer) and non-small cell lung cancer. This medicine may be used for other purposes; ask your health care provider or pharmacist if you have questions. COMMON BRAND NAME(S): Tecentriq What should I tell my health care provider before I take this medicine? They need to know if you have any of these conditions: -diabetes -immune system problems -infection -inflammatory bowel disease -liver disease -lung or breathing disease -lupus -nervous system problems like myasthenia gravis or Guillain-Barre syndrome -organ transplant -an unusual or allergic reaction to atezolizumab, other medicines, foods, dyes, or  preservatives -pregnant or trying to get pregnant -breast-feeding How should I use this medicine? This medicine is for infusion into a vein. It is given by a health care professional in a hospital or clinic setting. A special MedGuide will be given to you before each treatment. Be sure to read this information carefully each time. Talk to your pediatrician regarding the use of this medicine in children. Special care may be needed. Overdosage: If you think you have taken too much of this medicine contact a poison control center or emergency room at once. NOTE: This medicine is only for you. Do not share this medicine with others. What if I miss a dose? It is important not to miss your dose. Call your doctor or health care professional if you are unable to keep an appointment. What may interact with this medicine? Interactions have not been studied. This list may not describe all possible interactions. Give your health care provider a list of all the medicines, herbs, non-prescription drugs, or dietary supplements you use. Also tell them if you smoke, drink alcohol, or use illegal drugs. Some items may interact with your medicine. What should I watch for while using this medicine? Your condition will be monitored carefully while you are receiving this medicine. You may need blood work done while you are taking this medicine. Do not become pregnant while taking this medicine or for at least 5 months after stopping it. Women should inform their doctor if they wish to become pregnant or think they might be pregnant. There is a potential for serious side effects to an unborn child. Talk to your health care professional or pharmacist for more information. Do not breast-feed an infant while taking this medicine or for at  least 5 months after the last dose. What side effects may I notice from receiving this medicine? Side effects that you should report to your doctor or health care professional as soon as  possible: -allergic reactions like skin rash, itching or hives, swelling of the face, lips, or tongue -black, tarry stools -bloody or watery diarrhea -breathing problems -changes in vision -chest pain or chest tightness -chills -facial flushing -fever -headache -signs and symptoms of high blood sugar such as dizziness; dry mouth; dry skin; fruity breath; nausea; stomach pain; increased hunger or thirst; increased urination -signs and symptoms of liver injury like dark yellow or brown urine; general ill feeling or flu-like symptoms; light-colored stools; loss of appetite; nausea; right upper belly pain; unusually weak or tired; yellowing of the eyes or skin -stomach pain -trouble passing urine or change in the amount of urine Side effects that usually do not require medical attention (report to your doctor or health care professional if they continue or are bothersome): -cough -diarrhea -joint pain -muscle pain -muscle weakness -tiredness -weight loss This list may not describe all possible side effects. Call your doctor for medical advice about side effects. You may report side effects to FDA at 1-800-FDA-1088. Where should I keep my medicine? This drug is given in a hospital or clinic and will not be stored at home. NOTE: This sheet is a summary. It may not cover all possible information. If you have questions about this medicine, talk to your doctor, pharmacist, or health care provider.  2018 Elsevier/Gold Standard (2015-04-09 17:54:14)  Paclitaxel injection What is this medicine? PACLITAXEL (PAK li TAX el) is a chemotherapy drug. It targets fast dividing cells, like cancer cells, and causes these cells to die. This medicine is used to treat ovarian cancer, breast cancer, and other cancers. This medicine may be used for other purposes; ask your health care provider or pharmacist if you have questions. COMMON BRAND NAME(S): Onxol, Taxol What should I tell my health care provider  before I take this medicine? They need to know if you have any of these conditions: -blood disorders -irregular heartbeat -infection (especially a virus infection such as chickenpox, cold sores, or herpes) -liver disease -previous or ongoing radiation therapy -an unusual or allergic reaction to paclitaxel, alcohol, polyoxyethylated castor oil, other chemotherapy agents, other medicines, foods, dyes, or preservatives -pregnant or trying to get pregnant -breast-feeding How should I use this medicine? This drug is given as an infusion into a vein. It is administered in a hospital or clinic by a specially trained health care professional. Talk to your pediatrician regarding the use of this medicine in children. Special care may be needed. Overdosage: If you think you have taken too much of this medicine contact a poison control center or emergency room at once. NOTE: This medicine is only for you. Do not share this medicine with others. What if I miss a dose? It is important not to miss your dose. Call your doctor or health care professional if you are unable to keep an appointment. What may interact with this medicine? Do not take this medicine with any of the following medications: -disulfiram -metronidazole This medicine may also interact with the following medications: -cyclosporine -diazepam -ketoconazole -medicines to increase blood counts like filgrastim, pegfilgrastim, sargramostim -other chemotherapy drugs like cisplatin, doxorubicin, epirubicin, etoposide, teniposide, vincristine -quinidine -testosterone -vaccines -verapamil Talk to your doctor or health care professional before taking any of these medicines: -acetaminophen -aspirin -ibuprofen -ketoprofen -naproxen This list may not describe all possible  interactions. Give your health care provider a list of all the medicines, herbs, non-prescription drugs, or dietary supplements you use. Also tell them if you smoke, drink  alcohol, or use illegal drugs. Some items may interact with your medicine. What should I watch for while using this medicine? Your condition will be monitored carefully while you are receiving this medicine. You will need important blood work done while you are taking this medicine. This medicine can cause serious allergic reactions. To reduce your risk you will need to take other medicine(s) before treatment with this medicine. If you experience allergic reactions like skin rash, itching or hives, swelling of the face, lips, or tongue, tell your doctor or health care professional right away. In some cases, you may be given additional medicines to help with side effects. Follow all directions for their use. This drug may make you feel generally unwell. This is not uncommon, as chemotherapy can affect healthy cells as well as cancer cells. Report any side effects. Continue your course of treatment even though you feel ill unless your doctor tells you to stop. Call your doctor or health care professional for advice if you get a fever, chills or sore throat, or other symptoms of a cold or flu. Do not treat yourself. This drug decreases your body's ability to fight infections. Try to avoid being around people who are sick. This medicine may increase your risk to bruise or bleed. Call your doctor or health care professional if you notice any unusual bleeding. Be careful brushing and flossing your teeth or using a toothpick because you may get an infection or bleed more easily. If you have any dental work done, tell your dentist you are receiving this medicine. Avoid taking products that contain aspirin, acetaminophen, ibuprofen, naproxen, or ketoprofen unless instructed by your doctor. These medicines may hide a fever. Do not become pregnant while taking this medicine. Women should inform their doctor if they wish to become pregnant or think they might be pregnant. There is a potential for serious side effects  to an unborn child. Talk to your health care professional or pharmacist for more information. Do not breast-feed an infant while taking this medicine. Men are advised not to father a child while receiving this medicine. This product may contain alcohol. Ask your pharmacist or healthcare provider if this medicine contains alcohol. Be sure to tell all healthcare providers you are taking this medicine. Certain medicines, like metronidazole and disulfiram, can cause an unpleasant reaction when taken with alcohol. The reaction includes flushing, headache, nausea, vomiting, sweating, and increased thirst. The reaction can last from 30 minutes to several hours. What side effects may I notice from receiving this medicine? Side effects that you should report to your doctor or health care professional as soon as possible: -allergic reactions like skin rash, itching or hives, swelling of the face, lips, or tongue -low blood counts - This drug may decrease the number of white blood cells, red blood cells and platelets. You may be at increased risk for infections and bleeding. -signs of infection - fever or chills, cough, sore throat, pain or difficulty passing urine -signs of decreased platelets or bleeding - bruising, pinpoint red spots on the skin, black, tarry stools, nosebleeds -signs of decreased red blood cells - unusually weak or tired, fainting spells, lightheadedness -breathing problems -chest pain -high or low blood pressure -mouth sores -nausea and vomiting -pain, swelling, redness or irritation at the injection site -pain, tingling, numbness in the hands or feet -  slow or irregular heartbeat -swelling of the ankle, feet, hands Side effects that usually do not require medical attention (report to your doctor or health care professional if they continue or are bothersome): -bone pain -complete hair loss including hair on your head, underarms, pubic hair, eyebrows, and eyelashes -changes in the  color of fingernails -diarrhea -loosening of the fingernails -loss of appetite -muscle or joint pain -red flush to skin -sweating This list may not describe all possible side effects. Call your doctor for medical advice about side effects. You may report side effects to FDA at 1-800-FDA-1088. Where should I keep my medicine? This drug is given in a hospital or clinic and will not be stored at home. NOTE: This sheet is a summary. It may not cover all possible information. If you have questions about this medicine, talk to your doctor, pharmacist, or health care provider.  2018 Elsevier/Gold Standard (2015-01-07 19:58:00)  Carboplatin injection What is this medicine? CARBOPLATIN (KAR boe pla tin) is a chemotherapy drug. It targets fast dividing cells, like cancer cells, and causes these cells to die. This medicine is used to treat ovarian cancer and many other cancers. This medicine may be used for other purposes; ask your health care provider or pharmacist if you have questions. COMMON BRAND NAME(S): Paraplatin What should I tell my health care provider before I take this medicine? They need to know if you have any of these conditions: -blood disorders -hearing problems -kidney disease -recent or ongoing radiation therapy -an unusual or allergic reaction to carboplatin, cisplatin, other chemotherapy, other medicines, foods, dyes, or preservatives -pregnant or trying to get pregnant -breast-feeding How should I use this medicine? This drug is usually given as an infusion into a vein. It is administered in a hospital or clinic by a specially trained health care professional. Talk to your pediatrician regarding the use of this medicine in children. Special care may be needed. Overdosage: If you think you have taken too much of this medicine contact a poison control center or emergency room at once. NOTE: This medicine is only for you. Do not share this medicine with others. What if I  miss a dose? It is important not to miss a dose. Call your doctor or health care professional if you are unable to keep an appointment. What may interact with this medicine? -medicines for seizures -medicines to increase blood counts like filgrastim, pegfilgrastim, sargramostim -some antibiotics like amikacin, gentamicin, neomycin, streptomycin, tobramycin -vaccines Talk to your doctor or health care professional before taking any of these medicines: -acetaminophen -aspirin -ibuprofen -ketoprofen -naproxen This list may not describe all possible interactions. Give your health care provider a list of all the medicines, herbs, non-prescription drugs, or dietary supplements you use. Also tell them if you smoke, drink alcohol, or use illegal drugs. Some items may interact with your medicine. What should I watch for while using this medicine? Your condition will be monitored carefully while you are receiving this medicine. You will need important blood work done while you are taking this medicine. This drug may make you feel generally unwell. This is not uncommon, as chemotherapy can affect healthy cells as well as cancer cells. Report any side effects. Continue your course of treatment even though you feel ill unless your doctor tells you to stop. In some cases, you may be given additional medicines to help with side effects. Follow all directions for their use. Call your doctor or health care professional for advice if you get a fever, chills  or sore throat, or other symptoms of a cold or flu. Do not treat yourself. This drug decreases your body's ability to fight infections. Try to avoid being around people who are sick. This medicine may increase your risk to bruise or bleed. Call your doctor or health care professional if you notice any unusual bleeding. Be careful brushing and flossing your teeth or using a toothpick because you may get an infection or bleed more easily. If you have any dental  work done, tell your dentist you are receiving this medicine. Avoid taking products that contain aspirin, acetaminophen, ibuprofen, naproxen, or ketoprofen unless instructed by your doctor. These medicines may hide a fever. Do not become pregnant while taking this medicine. Women should inform their doctor if they wish to become pregnant or think they might be pregnant. There is a potential for serious side effects to an unborn child. Talk to your health care professional or pharmacist for more information. Do not breast-feed an infant while taking this medicine. What side effects may I notice from receiving this medicine? Side effects that you should report to your doctor or health care professional as soon as possible: -allergic reactions like skin rash, itching or hives, swelling of the face, lips, or tongue -signs of infection - fever or chills, cough, sore throat, pain or difficulty passing urine -signs of decreased platelets or bleeding - bruising, pinpoint red spots on the skin, black, tarry stools, nosebleeds -signs of decreased red blood cells - unusually weak or tired, fainting spells, lightheadedness -breathing problems -changes in hearing -changes in vision -chest pain -high blood pressure -low blood counts - This drug may decrease the number of white blood cells, red blood cells and platelets. You may be at increased risk for infections and bleeding. -nausea and vomiting -pain, swelling, redness or irritation at the injection site -pain, tingling, numbness in the hands or feet -problems with balance, talking, walking -trouble passing urine or change in the amount of urine Side effects that usually do not require medical attention (report to your doctor or health care professional if they continue or are bothersome): -hair loss -loss of appetite -metallic taste in the mouth or changes in taste This list may not describe all possible side effects. Call your doctor for medical  advice about side effects. You may report side effects to FDA at 1-800-FDA-1088. Where should I keep my medicine? This drug is given in a hospital or clinic and will not be stored at home. NOTE: This sheet is a summary. It may not cover all possible information. If you have questions about this medicine, talk to your doctor, pharmacist, or health care provider.  2018 Elsevier/Gold Standard (2007-06-13 14:38:05)

## 2017-11-10 ENCOUNTER — Telehealth: Payer: Self-pay | Admitting: Hematology

## 2017-11-10 ENCOUNTER — Other Ambulatory Visit: Payer: Self-pay

## 2017-11-10 DIAGNOSIS — C3490 Malignant neoplasm of unspecified part of unspecified bronchus or lung: Secondary | ICD-10-CM

## 2017-11-10 MED ORDER — LIDOCAINE-PRILOCAINE 2.5-2.5 % EX CREA: 1.0000 "application " | TOPICAL_CREAM | CUTANEOUS | 2 refills | Status: DC | PRN

## 2017-11-10 NOTE — Progress Notes (Signed)
E 

## 2017-11-10 NOTE — Telephone Encounter (Signed)
Appts scheduled/ added name to infusion log per 8/21 los

## 2017-11-11 ENCOUNTER — Other Ambulatory Visit: Payer: Self-pay | Admitting: Physician Assistant

## 2017-11-11 ENCOUNTER — Encounter: Payer: Self-pay | Admitting: Physician Assistant

## 2017-11-11 ENCOUNTER — Inpatient Hospital Stay: Payer: No Typology Code available for payment source

## 2017-11-11 VITALS — BP 104/83 | HR 98 | Temp 99.0°F | Resp 16

## 2017-11-11 DIAGNOSIS — C3490 Malignant neoplasm of unspecified part of unspecified bronchus or lung: Secondary | ICD-10-CM

## 2017-11-11 DIAGNOSIS — C3431 Malignant neoplasm of lower lobe, right bronchus or lung: Secondary | ICD-10-CM | POA: Diagnosis not present

## 2017-11-11 MED ORDER — PEGFILGRASTIM-CBQV 6 MG/0.6ML ~~LOC~~ SOSY
6.0000 mg | PREFILLED_SYRINGE | Freq: Once | SUBCUTANEOUS | Status: AC
  Administered 2017-11-11: 6 mg via SUBCUTANEOUS

## 2017-11-11 MED ORDER — PEGFILGRASTIM-CBQV 6 MG/0.6ML ~~LOC~~ SOSY
PREFILLED_SYRINGE | SUBCUTANEOUS | Status: AC
  Filled 2017-11-11: qty 0.6

## 2017-11-11 NOTE — Patient Instructions (Signed)
Pegfilgrastim injection What is this medicine? PEGFILGRASTIM (PEG fil gra stim) is a long-acting granulocyte colony-stimulating factor that stimulates the growth of neutrophils, a type of white blood cell important in the body's fight against infection. It is used to reduce the incidence of fever and infection in patients with certain types of cancer who are receiving chemotherapy that affects the bone marrow, and to increase survival after being exposed to high doses of radiation. This medicine may be used for other purposes; ask your health care provider or pharmacist if you have questions. COMMON BRAND NAME(S): Neulasta What should I tell my health care provider before I take this medicine? They need to know if you have any of these conditions: -kidney disease -latex allergy -ongoing radiation therapy -sickle cell disease -skin reactions to acrylic adhesives (On-Body Injector only) -an unusual or allergic reaction to pegfilgrastim, filgrastim, other medicines, foods, dyes, or preservatives -pregnant or trying to get pregnant -breast-feeding How should I use this medicine? This medicine is for injection under the skin. If you get this medicine at home, you will be taught how to prepare and give the pre-filled syringe or how to use the On-body Injector. Refer to the patient Instructions for Use for detailed instructions. Use exactly as directed. Tell your healthcare provider immediately if you suspect that the On-body Injector may not have performed as intended or if you suspect the use of the On-body Injector resulted in a missed or partial dose. It is important that you put your used needles and syringes in a special sharps container. Do not put them in a trash can. If you do not have a sharps container, call your pharmacist or healthcare provider to get one. Talk to your pediatrician regarding the use of this medicine in children. While this drug may be prescribed for selected conditions,  precautions do apply. Overdosage: If you think you have taken too much of this medicine contact a poison control center or emergency room at once. NOTE: This medicine is only for you. Do not share this medicine with others. What if I miss a dose? It is important not to miss your dose. Call your doctor or health care professional if you miss your dose. If you miss a dose due to an On-body Injector failure or leakage, a new dose should be administered as soon as possible using a single prefilled syringe for manual use. What may interact with this medicine? Interactions have not been studied. Give your health care provider a list of all the medicines, herbs, non-prescription drugs, or dietary supplements you use. Also tell them if you smoke, drink alcohol, or use illegal drugs. Some items may interact with your medicine. This list may not describe all possible interactions. Give your health care provider a list of all the medicines, herbs, non-prescription drugs, or dietary supplements you use. Also tell them if you smoke, drink alcohol, or use illegal drugs. Some items may interact with your medicine. What should I watch for while using this medicine? You may need blood work done while you are taking this medicine. If you are going to need a MRI, CT scan, or other procedure, tell your doctor that you are using this medicine (On-Body Injector only). What side effects may I notice from receiving this medicine? Side effects that you should report to your doctor or health care professional as soon as possible: -allergic reactions like skin rash, itching or hives, swelling of the face, lips, or tongue -dizziness -fever -pain, redness, or irritation at site   where injected -pinpoint red spots on the skin -red or dark-brown urine -shortness of breath or breathing problems -stomach or side pain, or pain at the shoulder -swelling -tiredness -trouble passing urine or change in the amount of urine Side  effects that usually do not require medical attention (report to your doctor or health care professional if they continue or are bothersome): -bone pain -muscle pain This list may not describe all possible side effects. Call your doctor for medical advice about side effects. You may report side effects to FDA at 1-800-FDA-1088. Where should I keep my medicine? Keep out of the reach of children. Store pre-filled syringes in a refrigerator between 2 and 8 degrees C (36 and 46 degrees F). Do not freeze. Keep in carton to protect from light. Throw away this medicine if it is left out of the refrigerator for more than 48 hours. Throw away any unused medicine after the expiration date. NOTE: This sheet is a summary. It may not cover all possible information. If you have questions about this medicine, talk to your doctor, pharmacist, or health care provider.  2018 Elsevier/Gold Standard (2016-03-04 12:58:03)  

## 2017-11-14 ENCOUNTER — Encounter (HOSPITAL_COMMUNITY): Payer: Self-pay

## 2017-11-14 ENCOUNTER — Other Ambulatory Visit: Payer: Self-pay | Admitting: Nurse Practitioner

## 2017-11-14 ENCOUNTER — Other Ambulatory Visit: Payer: Self-pay

## 2017-11-14 ENCOUNTER — Other Ambulatory Visit: Payer: Self-pay | Admitting: Radiology

## 2017-11-14 ENCOUNTER — Ambulatory Visit (HOSPITAL_COMMUNITY)
Admission: RE | Admit: 2017-11-14 | Discharge: 2017-11-14 | Disposition: A | Discharge status: Home / Self Care | Payer: No Typology Code available for payment source | Source: Ambulatory Visit | Attending: Nurse Practitioner | Admitting: Nurse Practitioner

## 2017-11-14 ENCOUNTER — Encounter: Payer: Self-pay | Admitting: Hematology

## 2017-11-14 DIAGNOSIS — Z7982 Long term (current) use of aspirin: Secondary | ICD-10-CM | POA: Diagnosis not present

## 2017-11-14 DIAGNOSIS — C3491 Malignant neoplasm of unspecified part of right bronchus or lung: Secondary | ICD-10-CM | POA: Diagnosis present

## 2017-11-14 DIAGNOSIS — Z8249 Family history of ischemic heart disease and other diseases of the circulatory system: Secondary | ICD-10-CM | POA: Insufficient documentation

## 2017-11-14 DIAGNOSIS — Z79899 Other long term (current) drug therapy: Secondary | ICD-10-CM | POA: Diagnosis not present

## 2017-11-14 DIAGNOSIS — Z808 Family history of malignant neoplasm of other organs or systems: Secondary | ICD-10-CM | POA: Diagnosis not present

## 2017-11-14 DIAGNOSIS — Z7951 Long term (current) use of inhaled steroids: Secondary | ICD-10-CM | POA: Diagnosis not present

## 2017-11-14 DIAGNOSIS — K219 Gastro-esophageal reflux disease without esophagitis: Secondary | ICD-10-CM | POA: Insufficient documentation

## 2017-11-14 DIAGNOSIS — Z885 Allergy status to narcotic agent status: Secondary | ICD-10-CM | POA: Diagnosis not present

## 2017-11-14 DIAGNOSIS — J449 Chronic obstructive pulmonary disease, unspecified: Secondary | ICD-10-CM | POA: Insufficient documentation

## 2017-11-14 DIAGNOSIS — K589 Irritable bowel syndrome without diarrhea: Secondary | ICD-10-CM | POA: Insufficient documentation

## 2017-11-14 DIAGNOSIS — C3490 Malignant neoplasm of unspecified part of unspecified bronchus or lung: Secondary | ICD-10-CM

## 2017-11-14 DIAGNOSIS — Z9884 Bariatric surgery status: Secondary | ICD-10-CM | POA: Diagnosis not present

## 2017-11-14 DIAGNOSIS — F329 Major depressive disorder, single episode, unspecified: Secondary | ICD-10-CM | POA: Diagnosis not present

## 2017-11-14 DIAGNOSIS — Z9889 Other specified postprocedural states: Secondary | ICD-10-CM | POA: Diagnosis not present

## 2017-11-14 DIAGNOSIS — Z800 Family history of malignant neoplasm of digestive organs: Secondary | ICD-10-CM | POA: Insufficient documentation

## 2017-11-14 DIAGNOSIS — F17210 Nicotine dependence, cigarettes, uncomplicated: Secondary | ICD-10-CM | POA: Diagnosis not present

## 2017-11-14 DIAGNOSIS — Z9049 Acquired absence of other specified parts of digestive tract: Secondary | ICD-10-CM | POA: Insufficient documentation

## 2017-11-14 DIAGNOSIS — F419 Anxiety disorder, unspecified: Secondary | ICD-10-CM | POA: Diagnosis not present

## 2017-11-14 DIAGNOSIS — Z836 Family history of other diseases of the respiratory system: Secondary | ICD-10-CM | POA: Diagnosis not present

## 2017-11-14 HISTORY — PX: IR IMAGING GUIDED PORT INSERTION: IMG5740

## 2017-11-14 LAB — CBC
HCT: 33.5 % — ABNORMAL LOW (ref 36.0–46.0)
HEMOGLOBIN: 10.6 g/dL — AB (ref 12.0–15.0)
MCH: 28.3 pg (ref 26.0–34.0)
MCHC: 31.6 g/dL (ref 30.0–36.0)
MCV: 89.3 fL (ref 78.0–100.0)
Platelets: 414 10*3/uL — ABNORMAL HIGH (ref 150–400)
RBC: 3.75 MIL/uL — AB (ref 3.87–5.11)
RDW: 15.2 % (ref 11.5–15.5)
WBC: 32.9 10*3/uL — ABNORMAL HIGH (ref 4.0–10.5)

## 2017-11-14 LAB — PROTIME-INR
INR: 0.9
Prothrombin Time: 12.1 seconds (ref 11.4–15.2)

## 2017-11-14 LAB — APTT: aPTT: 32 seconds (ref 24–36)

## 2017-11-14 MED ORDER — LIDOCAINE-EPINEPHRINE (PF) 2 %-1:200000 IJ SOLN
INTRAMUSCULAR | Status: AC | PRN
  Administered 2017-11-14: 20 mL

## 2017-11-14 MED ORDER — HEPARIN SOD (PORK) LOCK FLUSH 100 UNIT/ML IV SOLN
INTRAVENOUS | Status: AC
  Filled 2017-11-14: qty 5

## 2017-11-14 MED ORDER — CEFAZOLIN SODIUM-DEXTROSE 2-4 GM/100ML-% IV SOLN
2.0000 g | Freq: Once | INTRAVENOUS | Status: AC
  Administered 2017-11-14: 2 g via INTRAVENOUS

## 2017-11-14 MED ORDER — ONDANSETRON HCL 4 MG/2ML IJ SOLN
INTRAMUSCULAR | Status: AC
  Filled 2017-11-14: qty 2

## 2017-11-14 MED ORDER — MIDAZOLAM HCL 2 MG/2ML IJ SOLN
INTRAMUSCULAR | Status: AC | PRN
  Administered 2017-11-14 (×4): 1 mg via INTRAVENOUS
  Administered 2017-11-14: 2 mg via INTRAVENOUS

## 2017-11-14 MED ORDER — LIDOCAINE-EPINEPHRINE (PF) 2 %-1:200000 IJ SOLN
INTRAMUSCULAR | Status: AC
  Filled 2017-11-14: qty 20

## 2017-11-14 MED ORDER — PREDNISONE 10 MG PO TABS: 40.0000 mg | ORAL_TABLET | Freq: Every day | ORAL | 0 refills | Status: DC

## 2017-11-14 MED ORDER — MIDAZOLAM HCL 2 MG/2ML IJ SOLN
INTRAMUSCULAR | Status: AC
  Filled 2017-11-14: qty 2

## 2017-11-14 MED ORDER — FENTANYL CITRATE (PF) 100 MCG/2ML IJ SOLN
INTRAMUSCULAR | Status: AC
  Filled 2017-11-14: qty 2

## 2017-11-14 MED ORDER — SODIUM CHLORIDE 0.9 % IV SOLN
INTRAVENOUS | Status: DC
  Administered 2017-11-14: 11:00:00 via INTRAVENOUS

## 2017-11-14 MED ORDER — ONDANSETRON HCL 4 MG/2ML IJ SOLN
INTRAMUSCULAR | Status: AC | PRN
  Administered 2017-11-14: 4 mg via INTRAVENOUS

## 2017-11-14 MED ORDER — MIDAZOLAM HCL 2 MG/2ML IJ SOLN
INTRAMUSCULAR | Status: AC
  Filled 2017-11-14: qty 4

## 2017-11-14 MED ORDER — CEFAZOLIN SODIUM-DEXTROSE 2-4 GM/100ML-% IV SOLN
INTRAVENOUS | Status: AC
  Filled 2017-11-14: qty 100

## 2017-11-14 MED ORDER — FENTANYL CITRATE (PF) 100 MCG/2ML IJ SOLN
INTRAMUSCULAR | Status: AC | PRN
  Administered 2017-11-14 (×4): 50 ug via INTRAVENOUS

## 2017-11-14 NOTE — Discharge Instructions (Signed)
Please keep your dressing on for 24 hours. After 24 hours you may take a shower.   DO NOT use EMLA cream until all glue and steri-strips have come off on their own.    Implanted Port Insertion, Care After This sheet gives you information about how to care for yourself after your procedure. Your health care provider may also give you more specific instructions. If you have problems or questions, contact your health care provider. What can I expect after the procedure? After your procedure, it is common to have:  Discomfort at the port insertion site.  Bruising on the skin over the port. This should improve over 3-4 days.  Follow these instructions at home: [facility] care  After your port is placed, you will get a manufacturer's information card. The card has information about your port. Keep this card with you at all times.  Take care of the port as told by your health care provider. Ask your health care provider if you or a family member can get training for taking care of the port at home. A home health care nurse may also take care of the port.  Make sure to remember what type of port you have. Incision care  Follow instructions from your health care provider about how to take care of your port insertion site. Make sure you: ? Wash your hands with soap and water before you change your bandage (dressing). If soap and water are not available, use hand sanitizer. ? Change your dressing as told by your health care provider. ? Leave stitches (sutures), skin glue, or adhesive strips in place. These skin closures may need to stay in place for 2 weeks or longer. If adhesive strip edges start to loosen and curl up, you may trim the loose edges. Do not remove adhesive strips completely unless your health care provider tells you to do that.  Check your port insertion site every day for signs of infection. Check for: ? More redness, swelling, or pain. ? More fluid or blood. ? Warmth. ? Pus or a bad  smell. General instructions  Do not take baths, swim, or use a hot tub until your health care provider approves.  Do not lift anything that is heavier than 10 lb (4.5 kg) for a week, or as told by your health care provider.  Ask your health care provider when it is okay to: ? Return to work or school. ? Resume usual physical activities or sports.  Do not drive for 24 hours if you were given a medicine to help you relax (sedative).  Take over-the-counter and prescription medicines only as told by your health care provider.  Wear a medical alert bracelet in case of an emergency. This will tell any health care providers that you have a port.  Keep all follow-up visits as told by your health care provider. This is important. Contact a health care provider if:  You cannot flush your port with saline as directed, or you cannot draw blood from the port.  You have a fever or chills.  You have more redness, swelling, or pain around your port insertion site.  You have more fluid or blood coming from your port insertion site.  Your port insertion site feels warm to the touch.  You have pus or a bad smell coming from the port insertion site. Get help right away if:  You have chest pain or shortness of breath.  You have bleeding from your port that you cannot control.  Summary  Take care of the port as told by your health care provider.  Change your dressing as told by your health care provider.  Keep all follow-up visits as told by your health care provider. This information is not intended to replace advice given to you by your health care provider. Make sure you discuss any questions you have with your health care provider. Document Released: 12/27/2012 Document Revised: 01/28/2016 Document Reviewed: 01/28/2016 Elsevier Interactive Patient Education  2017 [place].   Moderate Conscious Sedation, Adult, Care After These instructions provide you with information about caring  for yourself after your procedure. Your health care provider may also give you more specific instructions. Your treatment has been planned according to current medical practices, but problems sometimes occur. Call your health care provider if you have any problems or questions after your procedure. What can I expect after the procedure? After your procedure, it is common:  To feel sleepy for several hours.  To feel clumsy and have poor balance for several hours.  To have poor judgment for several hours.  To vomit if you eat too soon.  Follow these instructions at home: For at least 24 hours after the procedure:   Do not: ? Participate in activities where you could fall or become injured. ? Drive. ? Use heavy machinery. ? Drink alcohol. ? Take sleeping pills or medicines that cause drowsiness. ? Make important decisions or sign legal documents. ? Take care of children on your own.  Rest. Eating and drinking  Follow the diet recommended by your health care provider.  If you vomit: ? Drink water, juice, or soup when you can drink without vomiting. ? Make sure you have little or no nausea before eating solid foods. General instructions  Have a responsible adult stay with you until you are awake and alert.  Take over-the-counter and prescription medicines only as told by your health care provider.  If you smoke, do not smoke without supervision.  Keep all follow-up visits as told by your health care provider. This is important. Contact a health care provider if:  You keep feeling nauseous or you keep vomiting.  You feel light-headed.  You develop a rash.  You have a fever. Get help right away if:  You have trouble breathing. This information is not intended to replace advice given to you by your health care provider. Make sure you discuss any questions you have with your health care provider. Document Released: 12/27/2012 Document Revised: 08/11/2015 Document  Reviewed: 06/28/2015 Elsevier Interactive Patient Education  [employer].

## 2017-11-14 NOTE — H&P (Addendum)
Chief Complaint: Patient was seen in consultation today for Port-A-Cath placement  Referring Physician(s): Burton,Lacie K/Feng,Y  Supervising Physician: Sandi Mariscal  Patient Status: [facility] - Out-pt  History of Present Illness: Yvonne Lang is a 52 y.o. 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  Past Medical History:  Diagnosis Date  . Anxiety   . Bronchitis   . COPD (chronic obstructive pulmonary disease) ([place])   . Depression   . GERD (gastroesophageal reflux disease)   . H/O gastric bypass    1999  . Irritable bowel syndrome   . Lung cancer [facility])     Past Surgical History:  Procedure Laterality Date  . BACK SURGERY    . CHOLECYSTECTOMY    . COLON SURGERY     for a kink in her colon and 12 inchs removed  . COLONOSCOPY N/A 12/13/2013   Procedure: COLONOSCOPY;  Surgeon: Rogene Houston, MD;  Location: [facility] ENDO SUITE;  Service: Endoscopy;  Laterality: N/A;  200  . ESOPHAGOGASTRODUODENOSCOPY N/A 12/13/2013   Procedure: ESOPHAGOGASTRODUODENOSCOPY (EGD);  Surgeon: Rogene Houston, MD;  Location: [facility] ENDO SUITE;  Service: Endoscopy;  Laterality: N/A;  . Gastric Bypas  2000    Allergies: Codeine  Medications: Prior to Admission medications   Medication Sig Start Date End Date Taking? Authorizing Provider  albuterol (PROVENTIL HFA) 108 (90 Base) MCG/ACT inhaler Inhale 2 puffs into the lungs 2 (two) times daily.  01/10/17 01/10/18  [provider]  albuterol (PROVENTIL) 4 MG tablet Take 4 mg by mouth 2 (two) times daily.  03/04/17   [provider]  ALPRAZolam Duanne Moron) 0.25 MG tablet Take 1 tablet (0.25 mg total) by mouth at bedtime as needed for anxiety. 10/28/17   Alla Feeling, NP  Ascorbic Acid (VITAMIN C) 100 MG tablet Take 100 mg by mouth daily.    [provider]  aspirin 81 MG tablet Take 81 mg by mouth daily.    [provider]  butalbital-acetaminophen-caffeine Emelda Brothers, ESGIC) [phone] MG tablet Take 1-2 tablets by mouth every 6 (six) hours as needed for headache. 10/28/17 10/28/18  Alla Feeling, NP  CALCIUM PO Take by mouth daily.    [provider]  dicyclomine (BENTYL) 20 MG tablet Take 1 tablet (20 mg total) by mouth every 6 (six) hours. 10/04/17   Truitt Merle, MD  DULoxetine (CYMBALTA) 60 MG capsule Take 60 mg by mouth daily.    [provider]  estradiol (ESTRACE) 2 MG tablet Take 1 tablet (2 mg total) by mouth daily. 05/16/17   Estill Dooms, NP  gabapentin (NEURONTIN) 300 MG capsule TAKE 1 CAPSULE BY MOUTH 4 TIMES DAILY 10/03/17   [provider]  lidocaine-prilocaine (EMLA) cream Apply 1 application topically as needed. Apply to port one hour prior to port access/chemo 11/10/17   Truitt Merle, MD  methocarbamol (ROBAXIN) 750 MG tablet Take 750 mg by mouth 4 (four) times daily.    [provider]  mirtazapine (REMERON) 7.5 MG tablet Take 1 tablet (7.5 mg total) by mouth at bedtime. 10/17/17   Truitt Merle, MD  Multiple Vitamin (MULTIVITAMIN) tablet Take 1 tablet by mouth daily.    [provider]  Omega-3 Fatty Acids (FISH OIL) 600 MG CAPS Take 600 mg by mouth daily.     [provider]  omeprazole (PRILOSEC) 20 MG capsule Take 1 capsule (20 mg total) by mouth 2 (two) times  daily before a meal. 10/10/17   Truitt Merle, MD  ondansetron (ZOFRAN) 8 MG tablet Take 1 tablet (8 mg total) by mouth every 8 (eight) hours as needed for nausea or vomiting. 10/17/17   Truitt Merle, MD  potassium chloride SA (K-DUR,KLOR-CON) 20 MEQ tablet Take 1 tablet (20 mEq total) by mouth 2 (two) times daily. 10/04/17   Truitt Merle, MD  predniSONE (DELTASONE) 10 MG tablet Take 4 tablets (40 mg total) by mouth daily with breakfast. 10/28/17   Alla Feeling, NP  Probiotic Product (PROBIOTIC-10 PO) Take by mouth.    [provider]  prochlorperazine (COMPAZINE) 10 MG tablet Take 1  tablet (10 mg total) by mouth every 6 (six) hours as needed for nausea or vomiting. 10/17/17   Truitt Merle, MD  progesterone (PROMETRIUM) 200 MG capsule Take 1 daily at [facility] 03/17/17   Derrek Monaco A, NP  RaNITidine HCl (ZANTAC PO) Take by mouth 3 (three) times daily.    [provider]  rOPINIRole (REQUIP) 0.5 MG tablet TAKE 1 TABLET BY MOUTH AT BEDTIME FOR 30 DAYS 11/04/17   [provider]  sucralfate (CARAFATE) 1 g tablet Take 1 tablet (1 g total) by mouth 4 (four) times daily. 10/04/17   Truitt Merle, MD  traMADol (ULTRAM) 50 MG tablet Take 1 tablet (50 mg total) by mouth every 6 (six) hours as needed. 10/04/17   Truitt Merle, MD  TRELEGY ELLIPTA 100-62.5-25 MCG/INH AEPB INHALE 1 PUFF INTO THE LUNGS EVERY DAY 10/27/17   [provider]  UNABLE TO FIND Inhale into the lungs. Med Name: Trilogy as needed    [provider]     Family History  Problem Relation Age of Onset  . Other Mother        has a pacemaker; rectal prolapse; rods in legs; lung fibrosis   . Other Father        heart gave out  . Hypertension Sister   . Bronchitis Sister   . Heart attack Paternal Grandmother   . COPD Maternal Grandmother   . Emphysema Maternal Grandmother   . Alcoholism Maternal Grandfather   . Cancer Sister        rare skin cancer  . Cancer Maternal Uncle 7       colon cancer   . Colon cancer Maternal Uncle     Social History   Socioeconomic History  . Marital status: Single    Spouse name: Not on file  . Number of children: Not on file  . Years of education: Not on file  . Highest education level: Not on file  Occupational History  . Not on file  Social Needs  . Financial resource strain: Not on file  . Food insecurity:    Worry: Not on file    Inability: Not on file  . Transportation needs:    Medical: Not on file    Non-medical: Not on file  Tobacco Use  . Smoking status: Current Every Day Smoker    Packs/day: 1.00    Years: 40.00    Pack years: 40.00      Types: Cigarettes  . Smokeless tobacco: Never Used  . Tobacco comment: Pack a day x 30 yrs. Trying to stop smoking using nicotine gum.   Substance and Sexual Activity  . Alcohol use: No  . Drug use: No  . Sexual activity: Not Currently    Birth control/protection: Post-menopausal  Lifestyle  . Physical activity:    Days per week: Not  on file    Minutes per session: Not on file  . Stress: Not on file  Relationships  . Social connections:    Talks on phone: Not on file    Gets together: Not on file    Attends religious service: Not on file    Active member of club or organization: Not on file    Attends meetings of clubs or organizations: Not on file    Relationship status: Not on file  Other Topics Concern  . Not on file  Social History Narrative  . Not on file      Review of Systems denies fever, chest pain, abdominal/back pain, nausea, vomiting or bleeding.  Does have occasional headaches, some dyspnea with exertion, occasional cough  Vital Signs:pend   Physical Exam awake, alert.  Chest with scattered wheezes/rhonchi; heart with slightly tachycardic but regular rhythm.  Abdomen soft, positive bowel sounds, nontender.  No significant lower extremity edema.  Imaging: No results found.  Labs:  CBC: Recent Labs    10/04/17 1246 11/09/17 0826  WBC 19.0* 12.5*  HGB 12.2 11.2*  HCT 38.1 34.7*  PLT 538* 634*    COAGS: No results for input(s): INR, APTT in the last 8760 hours.  BMP: Recent Labs    10/04/17 1246 11/09/17 0826  NA 140 136  K 2.8* 3.5  CL 104 99  CO2 27 27  GLUCOSE 130* 94  BUN 6 7  CALCIUM 8.4* 8.2*  CREATININE 0.78 0.60  GFRNONAA >60 >60  GFRAA >60 >60    LIVER FUNCTION TESTS: Recent Labs    10/04/17 1246 11/09/17 0826  BILITOT <0.2* <0.2*  AST 31 33  ALT 100* 29  ALKPHOS 187* 126  PROT 6.2* 6.6  ALBUMIN 3.1* 2.6*    TUMOR MARKERS: No results for input(s): AFPTM, CEA, CA199, CHROMGRNA in the last 8760  hours.  Assessment and Plan: 52 y.o. 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  All of the patient's questions were answered, patient is agreeable to proceed. Consent signed and in chart.  LABS PENDING    Thank you for this interesting consult.  I greatly enjoyed meeting Yvonne Lang and look forward to participating in their care.  A copy of this report was sent to the requesting provider on this date.  Electronically Signed: D. Rowe Robert, PA-C 11/14/2017, 10:16 AM   I spent a total of  25 minutes   in face to face in clinical consultation, greater than 50% of which was counseling/coordinating care for port a  cath placement

## 2017-11-14 NOTE — Procedures (Signed)
Pre Procedure Dx: Lung cancer Post Procedural Dx: Same  Successful placement of right IJ approach port-a-cath with tip at the superior caval atrial junction. The catheter is ready for immediate use.  Estimated Blood Loss: Minimal  Complications: None immediate.  Jay Mailey Landstrom, MD Pager #: [phone]   

## 2017-11-15 ENCOUNTER — Other Ambulatory Visit: Payer: Self-pay

## 2017-11-15 DIAGNOSIS — C3490 Malignant neoplasm of unspecified part of unspecified bronchus or lung: Secondary | ICD-10-CM

## 2017-11-15 MED ORDER — BUTALBITAL-APAP-CAFFEINE 50-325-40 MG PO TABS: 1.0000 | ORAL_TABLET | Freq: Four times a day (QID) | ORAL | 1 refills | Status: DC | PRN

## 2017-11-18 ENCOUNTER — Inpatient Hospital Stay: Payer: No Typology Code available for payment source

## 2017-11-18 ENCOUNTER — Encounter: Payer: Self-pay | Admitting: Nurse Practitioner

## 2017-11-18 ENCOUNTER — Telehealth: Payer: Self-pay | Admitting: Hematology

## 2017-11-18 ENCOUNTER — Inpatient Hospital Stay (HOSPITAL_BASED_OUTPATIENT_CLINIC_OR_DEPARTMENT_OTHER): Payer: No Typology Code available for payment source | Admitting: Nurse Practitioner

## 2017-11-18 VITALS — BP 113/81 | HR 110 | Temp 98.5°F | Resp 18 | Ht 67.0 in | Wt 192.3 lb

## 2017-11-18 DIAGNOSIS — C3490 Malignant neoplasm of unspecified part of unspecified bronchus or lung: Secondary | ICD-10-CM

## 2017-11-18 DIAGNOSIS — Z9884 Bariatric surgery status: Secondary | ICD-10-CM

## 2017-11-18 DIAGNOSIS — K219 Gastro-esophageal reflux disease without esophagitis: Secondary | ICD-10-CM

## 2017-11-18 DIAGNOSIS — G8929 Other chronic pain: Secondary | ICD-10-CM

## 2017-11-18 DIAGNOSIS — F3181 Bipolar II disorder: Secondary | ICD-10-CM

## 2017-11-18 DIAGNOSIS — R05 Cough: Secondary | ICD-10-CM

## 2017-11-18 DIAGNOSIS — F419 Anxiety disorder, unspecified: Secondary | ICD-10-CM

## 2017-11-18 DIAGNOSIS — Z95828 Presence of other vascular implants and grafts: Secondary | ICD-10-CM

## 2017-11-18 DIAGNOSIS — Z7982 Long term (current) use of aspirin: Secondary | ICD-10-CM

## 2017-11-18 DIAGNOSIS — R530 Neoplastic (malignant) related fatigue: Secondary | ICD-10-CM

## 2017-11-18 DIAGNOSIS — Z79899 Other long term (current) drug therapy: Secondary | ICD-10-CM

## 2017-11-18 DIAGNOSIS — M549 Dorsalgia, unspecified: Secondary | ICD-10-CM

## 2017-11-18 DIAGNOSIS — D72829 Elevated white blood cell count, unspecified: Secondary | ICD-10-CM

## 2017-11-18 DIAGNOSIS — R51 Headache: Secondary | ICD-10-CM

## 2017-11-18 DIAGNOSIS — E876 Hypokalemia: Secondary | ICD-10-CM

## 2017-11-18 DIAGNOSIS — Z923 Personal history of irradiation: Secondary | ICD-10-CM

## 2017-11-18 DIAGNOSIS — C7989 Secondary malignant neoplasm of other specified sites: Secondary | ICD-10-CM

## 2017-11-18 DIAGNOSIS — M5417 Radiculopathy, lumbosacral region: Secondary | ICD-10-CM

## 2017-11-18 DIAGNOSIS — C3431 Malignant neoplasm of lower lobe, right bronchus or lung: Secondary | ICD-10-CM | POA: Diagnosis not present

## 2017-11-18 LAB — CBC WITH DIFFERENTIAL (CANCER CENTER ONLY)
BASOS PCT: 0 %
Basophils Absolute: 0.1 10*3/uL (ref 0.0–0.1)
EOS ABS: 0 10*3/uL (ref 0.0–0.5)
Eosinophils Relative: 0 %
HCT: 32.9 % — ABNORMAL LOW (ref 34.8–46.6)
HEMOGLOBIN: 10.2 g/dL — AB (ref 11.6–15.9)
Lymphocytes Relative: 3 %
Lymphs Abs: 0.8 10*3/uL — ABNORMAL LOW (ref 0.9–3.3)
MCH: 27.2 pg (ref 25.1–34.0)
MCHC: 31 g/dL — ABNORMAL LOW (ref 31.5–36.0)
MCV: 87.7 fL (ref 79.5–101.0)
Monocytes Absolute: 1.3 10*3/uL — ABNORMAL HIGH (ref 0.1–0.9)
Monocytes Relative: 5 %
Neutro Abs: 26 10*3/uL — ABNORMAL HIGH (ref 1.5–6.5)
Neutrophils Relative %: 92 %
Platelet Count: 352 10*3/uL (ref 145–400)
RBC: 3.75 MIL/uL (ref 3.70–5.45)
RDW: 15.8 % — ABNORMAL HIGH (ref 11.2–14.5)
WBC Count: 28.2 10*3/uL — ABNORMAL HIGH (ref 3.9–10.3)

## 2017-11-18 LAB — CMP (CANCER CENTER ONLY)
ALBUMIN: 2.7 g/dL — AB (ref 3.5–5.0)
ALK PHOS: 191 U/L — AB (ref 38–126)
ALT: 36 U/L (ref 0–44)
AST: 18 U/L (ref 15–41)
Anion gap: 10 (ref 5–15)
BUN: 6 mg/dL (ref 6–20)
CALCIUM: 8.9 mg/dL (ref 8.9–10.3)
CO2: 28 mmol/L (ref 22–32)
CREATININE: 0.61 mg/dL (ref 0.44–1.00)
Chloride: 102 mmol/L (ref 98–111)
GFR, Est AFR Am: >60 mL/min (ref >60)
GFR, Estimated: >60 mL/min (ref >60)
GLUCOSE: 105 mg/dL — AB (ref 70–99)
Potassium: 3.5 mmol/L (ref 3.5–5.1)
SODIUM: 140 mmol/L (ref 135–145)
TOTAL PROTEIN: 6.4 g/dL — AB (ref 6.5–8.1)

## 2017-11-18 MED ORDER — MIRTAZAPINE 7.5 MG PO TABS: 7.5000 mg | ORAL_TABLET | Freq: Every day | ORAL | 1 refills | Status: DC

## 2017-11-18 MED ORDER — POTASSIUM CHLORIDE CRYS ER 20 MEQ PO TBCR: 20.0000 meq | EXTENDED_RELEASE_TABLET | Freq: Every day | ORAL | 0 refills | Status: DC

## 2017-11-18 MED ORDER — ALPRAZOLAM 0.25 MG PO TABS: 0.2500 mg | ORAL_TABLET | Freq: Every evening | ORAL | 0 refills | Status: DC | PRN

## 2017-11-18 MED ORDER — HEPARIN SOD (PORK) LOCK FLUSH 100 UNIT/ML IV SOLN
500.0000 [IU] | Freq: Once | INTRAVENOUS | Status: AC
  Administered 2017-11-18: 500 [IU] via INTRAVENOUS
  Filled 2017-11-18: qty 5

## 2017-11-18 MED ORDER — SODIUM CHLORIDE 0.9% FLUSH
10.0000 mL | INTRAVENOUS | Status: DC | PRN
  Administered 2017-11-18: 10 mL via INTRAVENOUS
  Filled 2017-11-18: qty 10

## 2017-11-18 NOTE — Progress Notes (Signed)
[place]  [phone] [phone]  Clinic Follow up Note   Patient Care Team: Redmond [employer], MD as PCP - General (Internal Medicine) 11/18/2017  SUMMARY OF ONCOLOGIC HISTORY: Oncology History   Cancer Staging Metastatic non-small cell lung cancer [facility]) Staging form: Lung, AJCC 8th Edition - Clinical stage from 10/17/2017: Stage IV (cT3, cN1, cM1b) - Signed by Truitt Merle, MD on 10/17/2017       Metastatic non-small cell lung cancer ([place])   09/20/2017 Imaging    US Breast Left 09/20/17  IMPRESSION Indeterminte palpable mass measuring 3.2x1.7x2.8 cm  inferior medial to the inframammary fold of the left breast.      09/20/2017 Initial Biopsy    Diagnosis 09/20/17 Soft Tissue Needle Core Biopsy, inferior medial to IMF - ADENOCARCINOMA. - SEE MICROSCOPIC DESCRIPTION.  Microscopic Comment Immunohistochemistry will be performed and reported as an addendum. (JDP:ah 09/23/17) ADDENDUM: Immunohistochemistry shows the tumor is strongly positive with cytokeratin AE1/AE3, cytokeratin 7 and shows moderate weak positivity with CDX-2. The tumor is negative with estrogen receptor, progesterone receptor, GCDFP, GATA-3, Napsin A, TTF-1, WT-1 and cytokeratin 20. The immunophenotype is consistent with metastatic carcinoma. The combination of CDX-2 and cytokeratin 7 positivity raises the possibility of an upper gastrointestinal primary. Clinical correlation is essential.    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 Silverio Decamp 10/10/17  IMPRESSION - Normal esophagus. - Z-line regular, 35 cm from the incisors. - Gastric bypass with a normal-sized pouch and intact staple line. Gastrojejunal anastomosis characterized by healthy appearing mucosa. - No specimens collected.    10/12/2017 Imaging    CT CAP WO Contrast 10/12/17  IMPRESSION: 7 cm central  right lung mass involving the right hilum, with postobstructive collapse of the right middle lobe, highly suspicious for primary bronchogenic carcinoma. 5 cm masslike opacity in the superior segment of the right lower lobe may represent carcinoma or postobstructive pneumonitis. 3.3 cm soft tissue mass in the lower anterior chest wall soft tissues, suspicious for metastatic disease. No evidence of abdominal or pelvic metastatic disease.     10/14/2017 Imaging    MRI Brain 10/14/17  IMPRESSION: 1. No metastatic disease or acute intracranial abnormality. Normal MRI appearance of the brain. 2. 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      10/17/2017 Cancer Staging    Staging form: Lung, AJCC 8th Edition - Clinical stage from 10/17/2017: Stage IV (cT3, cN1, cM1b) - Signed by Truitt Merle, MD on 10/17/2017    11/08/2017 -  Chemotherapy    The patient had palonosetron (ALOXI) injection 0.25 mg, 0.25 mg, Intravenous,  Once, 1 of 4 cycles Administration: 0.25 mg (11/09/2017) pegfilgrastim-cbqv (UDENYCA) injection 6 mg, 6 mg, Subcutaneous, Once, 1 of 4 cycles Administration: 6 mg (11/11/2017) CARBOplatin (PARAPLATIN) 540 mg in sodium chloride 0.9 % 250 mL chemo infusion, 540 mg (100 % of original dose 543.2 mg), Intravenous,  Once, 1 of 4 cycles Dose modification:   (original dose 543.2 mg, Cycle 1) Administration: 540 mg (11/09/2017) PACLitaxel (TAXOL) 306 mg in sodium chloride 0.9 % 500 mL chemo infusion (> 37m/m2), 150 mg/m2 = 306 mg (100 % of original dose 150 mg/m2), Intravenous,  Once, 1 of 4 cycles Dose modification: 175 mg/m2 (original dose 150 mg/m2, Cycle 1, Reason: Provider Judgment), 150 mg/m2 (original dose 150 mg/m2, Cycle 1, Reason: Provider Judgment) Administration: 306 mg (11/09/2017) fosaprepitant (EMEND) 150 mg, dexamethasone (  DECADRON) 12 mg in sodium chloride 0.9 % 145 mL IVPB, , Intravenous,  Once, 1 of 4 cycles Administration:  (11/09/2017) atezolizumab (TECENTRIQ)  1,200 mg in sodium chloride 0.9 % 250 mL chemo infusion, 1,200 mg, Intravenous, Once, 1 of 4 cycles Administration: 1,200 mg (11/09/2017)  for chemotherapy treatment.    CURRENT THERAPY: Carboplatin/Taxol, atezolizumab (Tecentriq), and avastin with Udenyca, q3 weeks; began 11/09/17; avastin held with cycle 1 due to pending PAC placement   INTERVAL HISTORY: Ms. Dorvil returns for lab and f/u as scheduled. She began cycle 1 on 8/21 with dose-reduced carbo AUC 4, taxol 150 mg/m2, and tecentriq. Avastin was held due to upcoming [facility] placement which she completed on 8/26 without difficulty. She felt very tired and rested often for 2 days after treatment. Energy level gradually improved and for past 2-3 days has felt very good; she is active at home and leaves the house. Mirtazapine has helped appetite remain stable, no weight loss or mucositis. She eats and drinks well. For 2 days after treatment she took compazine for mild intermittent nausea without emesis, then added zofran for additional relief. No nausea/v/c/d this week. Her dry, intermittent cough and dyspnea on exertion are improving. She abruptly stopped prednisone for 2 days after chemo because her respiratory function improved, but then restarted on day 3 after her injection lead to severe bone pain, not relieved by claritin or tramadol. She denies fever, chills, chest pain, hemoptysis. She had severe bone pain for 3-4 days. Chest nodule remains sore, as well as mild tenderness at [facility] site, otherwise denies new pain.    MEDICAL HISTORY:  Past Medical History:  Diagnosis Date  . Anxiety   . Bronchitis   . COPD (chronic obstructive pulmonary disease) ([place])   . Depression   . GERD (gastroesophageal reflux disease)   . H/O gastric bypass    1999  . Irritable bowel syndrome   . Lung cancer [facility])     SURGICAL HISTORY: Past Surgical History:  Procedure Laterality Date  . BACK SURGERY    . CHOLECYSTECTOMY    . COLON SURGERY     for a kink  in her colon and 12 inchs removed  . COLONOSCOPY N/A 12/13/2013   Procedure: COLONOSCOPY;  Surgeon: Rogene Houston, MD;  Location: [facility];  Service: Endoscopy;  Laterality: N/A;  200  . ESOPHAGOGASTRODUODENOSCOPY N/A 12/13/2013   Procedure: ESOPHAGOGASTRODUODENOSCOPY (EGD);  Surgeon: Rogene Houston, MD;  Location: [facility];  Service: Endoscopy;  Laterality: N/A;  . Gastric Bypas  2000  . IR IMAGING GUIDED PORT INSERTION  11/14/2017    I have reviewed the social history and family history with the patient and they are unchanged from previous note.  ALLERGIES:  is allergic to codeine.  MEDICATIONS:  Current Outpatient Medications  Medication Sig Dispense Refill  . albuterol (PROVENTIL HFA) 108 (90 Base) MCG/ACT inhaler Inhale 2 puffs into the lungs 2 (two) times daily.     Marland Kitchen albuterol (PROVENTIL) 4 MG tablet Take 4 mg by mouth 2 (two) times daily.     Marland Kitchen ALPRAZolam (XANAX) 0.25 MG tablet Take 1 tablet (0.25 mg total) by mouth at bedtime as needed for anxiety. 30 tablet 0  . butalbital-acetaminophen-caffeine (FIORICET, ESGIC) 50-325-40 MG tablet Take 1-2 tablets by mouth every 6 (six) hours as needed for headache. 40 tablet 1  . dicyclomine (BENTYL) 20 MG tablet Take 1 tablet (20 mg total) by mouth every 6 (six) hours. 60 tablet 1  . DULoxetine (CYMBALTA)  60 MG capsule Take 60 mg by mouth daily.    Marland Kitchen gabapentin (NEURONTIN) 300 MG capsule TAKE 1 CAPSULE BY MOUTH 4 TIMES DAILY  4  . mirtazapine (REMERON) 7.5 MG tablet Take 1 tablet (7.5 mg total) by mouth at bedtime. 30 tablet 1  . Multiple Vitamin (MULTIVITAMIN) tablet Take 1 tablet by mouth daily.    Marland Kitchen omeprazole (PRILOSEC) 20 MG capsule Take 1 capsule (20 mg total) by mouth 2 (two) times daily before a meal. 60 capsule 0  . ondansetron (ZOFRAN) 8 MG tablet Take 1 tablet (8 mg total) by mouth every 8 (eight) hours as needed for nausea or vomiting. 30 tablet 1  . potassium chloride SA (K-DUR,KLOR-CON) 20 MEQ tablet Take 1 tablet (20  mEq total) by mouth daily. 60 tablet 0  . predniSONE (DELTASONE) 10 MG tablet Take 4 tablets (40 mg total) by mouth daily with breakfast. 56 tablet 0  . prochlorperazine (COMPAZINE) 10 MG tablet Take 1 tablet (10 mg total) by mouth every 6 (six) hours as needed for nausea or vomiting. 30 tablet 0  . RaNITidine HCl (ZANTAC PO) Take by mouth 3 (three) times daily.    Marland Kitchen rOPINIRole (REQUIP) 0.5 MG tablet TAKE 1 TABLET BY MOUTH AT BEDTIME FOR 30 DAYS  11  . sucralfate (CARAFATE) 1 g tablet Take 1 tablet (1 g total) by mouth 4 (four) times daily. 120 tablet 1  . traMADol (ULTRAM) 50 MG tablet Take 1 tablet (50 mg total) by mouth every 6 (six) hours as needed. 90 tablet 0  . TRELEGY ELLIPTA 100-62.5-25 MCG/INH AEPB INHALE 1 PUFF INTO THE LUNGS EVERY DAY  5  . Ascorbic Acid (VITAMIN C) 100 MG tablet Take 100 mg by mouth daily.    Marland Kitchen aspirin 81 MG tablet Take 81 mg by mouth daily.    Marland Kitchen CALCIUM PO Take by mouth daily.    Marland Kitchen estradiol (ESTRACE) 2 MG tablet Take 1 tablet (2 mg total) by mouth daily. 30 tablet 6  . lidocaine-prilocaine (EMLA) cream Apply 1 application topically as needed. Apply to port one hour prior to port access/chemo (Patient not taking: Reported on 11/18/2017) 30 g 2  . methocarbamol (ROBAXIN) 750 MG tablet Take 750 mg by mouth 4 (four) times daily.    . Omega-3 Fatty Acids (FISH OIL) 600 MG CAPS Take 600 mg by mouth daily.     . Probiotic Product (PROBIOTIC-10 PO) Take by mouth.    . progesterone (PROMETRIUM) 200 MG capsule Take 1 daily at HS (Patient not taking: Reported on 11/18/2017) 30 capsule 6  . UNABLE TO FIND Inhale into the lungs. Med Name: Trilogy as needed     Current Facility-Administered Medications  Medication Dose Route Frequency Provider Last Rate Last Dose  . sodium chloride flush (NS) 0.9 % injection 10 mL  10 mL Intravenous PRN Truitt Merle, MD   10 mL at 11/18/17 1017    PHYSICAL EXAMINATION: ECOG PERFORMANCE STATUS: 1 - Symptomatic but completely ambulatory  Vitals:    11/18/17 0914  BP: 113/81  Pulse: (!) 110  Resp: 18  Temp: 98.5 F (36.9 C)  SpO2: 97%   Filed Weights   11/18/17 0914  Weight: 192 lb 4.8 oz (87.2 kg)    GENERAL:alert, no distress and comfortable SKIN: skin color, texture, turgor are normal, no rashes or significant lesions EYES: sclera clear OROPHARYNX:no thrush or ulcers LYMPH:  no palpable cervical or supraclavicular lymphadenopathy LUNGS: clear to auscultation, with normal breathing effort HEART: tachycardic, regular  rhythm; no lower extremity edema ABDOMEN:abdomen soft, non-tender and normal bowel sounds CHEST: 4 cm tender subcutaneous nodule, unchanged. Mild erythema without open wound or skin ulceration  Musculoskeletal:no cyanosis of digits and no clubbing  NEURO: alert & oriented x 3 with fluent speech, no focal motor/sensory deficits PAC is accessed, site covered with gauze   LABORATORY DATA:  I have reviewed the data as listed CBC Latest Ref Rng & Units 11/18/2017 11/14/2017 11/09/2017  WBC 3.9 - 10.3 K/uL 28.2(H) 32.9(H) 12.5(H)  Hemoglobin 11.6 - 15.9 g/dL 10.2(L) 10.6(L) 11.2(L)  Hematocrit 34.8 - 46.6 % 32.9(L) 33.5(L) 34.7(L)  Platelets 145 - 400 K/uL 352 414(H) 634(H)     CMP Latest Ref Rng & Units 11/18/2017 11/09/2017 10/04/2017  Glucose 70 - 99 mg/dL 105(H) 94 130(H)  BUN 6 - 20 mg/dL '6 7 6  ' Creatinine 0.44 - 1.00 mg/dL 0.61 0.60 0.78  Sodium 135 - 145 mmol/L 140 136 140  Potassium 3.5 - 5.1 mmol/L 3.5 3.5 2.8(LL)  Chloride 98 - 111 mmol/L 102 99 104  CO2 22 - 32 mmol/L '28 27 27  ' Calcium 8.9 - 10.3 mg/dL 8.9 8.2(L) 8.4(L)  Total Protein 6.5 - 8.1 g/dL 6.4(L) 6.6 6.2(L)  Total Bilirubin 0.3 - 1.2 mg/dL <0.2(L) <0.2(L) <0.2(L)  Alkaline Phos 38 - 126 U/L 191(H) 126 187(H)  AST 15 - 41 U/L 18 33 31  ALT 0 - 44 U/L 36 29 100(H)      RADIOGRAPHIC STUDIES: I have personally reviewed the radiological images as listed and agreed with the findings in the report. No results found.   ASSESSMENT &  PLAN: Kayline Sheer a 52 y.o.femalewith history of bipolar II and lumbosacral radiculitis.   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 New onset frontal headache; MRI brain 10/14/17 negative for metastatic disease but shows C3-C4 disc and endplate degeneration 3. Anxiety and depression 4. GERD 5. Chronic back pain 6. Low appetite and nausea 7. Support  Ms. Castilla appears stable. She completed cycle 1 dose-reduced carbo/taxol, atezolizumab and Udenyca. Avastin was held due to [facility] placement. She tolerated treatment well overall, with fatigue and mild nausea. Symptoms were well managed with po PRN regimen. She developed severe bone pain with udenyca, improved with steroids. Due to her improved respiratory function, I recommend to taper prednisone to 35 mg daily. I reviewed the rationale to avoid abrupt discontinuation of corticosteroids.   CBC is stable. Leukocytosis is most likely related to prednisone and GCSF. CMP reviewed. She will continue oral K 20 mEq 1 tab daily. I refilled. I recommend she avoid excessive tylenol to avoid hepatotoxicity. She takes fioricet and tylenol PM often, I advised her to avoid concomitant use.   Given her overall stable condition, good tolerance to treatment, and ability to maintain adequate po hydration, I am cancelling IVF today. She knows to call sooner if she has new or worsening concerns.   She will return for lab and f/u with Dr. Burr Medico in 2 weeks for cycle 2, with plan to increase doses and add avastin; PET scan is scheduled after 2nd cycle.   I refilled potassium, mirtazapine, and xanax today.  PLAN: -Labs reviewed  -Cancel IVF, continue adequate po hydration  -Taper prednisone to 35 mg daily -Continue oral K 20 mEq daily, refilled  -Refilled mirtazapine, xanax  All questions were answered. The patient knows to call the clinic with any problems, questions or concerns. No barriers  to learning was detected.  I spent 20  minutes counseling the patient face to face. The total time spent in the appointment was 25 minutes and more than 50% was on counseling and review of test results     Alla Feeling, NP 11/18/17

## 2017-11-18 NOTE — Telephone Encounter (Signed)
No LOS 8/30

## 2017-11-21 ENCOUNTER — Encounter: Payer: Self-pay | Admitting: Nurse Practitioner

## 2017-11-22 ENCOUNTER — Telehealth: Payer: Self-pay | Admitting: Gastroenterology

## 2017-11-22 ENCOUNTER — Other Ambulatory Visit: Payer: Self-pay | Admitting: Nurse Practitioner

## 2017-11-22 ENCOUNTER — Other Ambulatory Visit: Payer: Self-pay

## 2017-11-22 DIAGNOSIS — C799 Secondary malignant neoplasm of unspecified site: Secondary | ICD-10-CM

## 2017-11-22 DIAGNOSIS — C3490 Malignant neoplasm of unspecified part of unspecified bronchus or lung: Secondary | ICD-10-CM

## 2017-11-22 MED ORDER — ALPRAZOLAM 0.25 MG PO TABS: 0.2500 mg | ORAL_TABLET | Freq: Every evening | ORAL | 0 refills | Status: DC | PRN

## 2017-11-22 NOTE — Telephone Encounter (Signed)
Letter written and faxed as requested.

## 2017-11-28 ENCOUNTER — Encounter: Payer: Self-pay | Admitting: Nurse Practitioner

## 2017-11-29 ENCOUNTER — Other Ambulatory Visit: Payer: Self-pay | Admitting: Emergency Medicine

## 2017-11-29 DIAGNOSIS — C3490 Malignant neoplasm of unspecified part of unspecified bronchus or lung: Secondary | ICD-10-CM

## 2017-11-29 MED ORDER — PREDNISONE 10 MG PO TABS: 40.0000 mg | ORAL_TABLET | Freq: Every day | ORAL | 0 refills | Status: DC

## 2017-11-29 NOTE — Progress Notes (Signed)
[place]  [phone] [phone]) [phone]  Clinic Follow Up Note   Patient Care Team: Yvonne School, MD as PCP - General (Internal Medicine)   Date of Service:  12/01/2017  CHIEF COMPLAINTS:  F/u for metastatic lung adenocarcinoma  Oncology History   Cancer Staging Metastatic non-small cell lung cancer [facility]) Staging form: Lung, AJCC 8th Edition - Clinical stage from 10/17/2017: Stage IV (cT3, cN1, cM1b) - Signed by Yvonne Merle, MD on 10/17/2017       Metastatic non-small cell lung cancer ([place])   09/20/2017 Imaging    US Breast Left 09/20/17  IMPRESSION Indeterminte palpable mass measuring 3.2x1.7x2.8 cm  inferior medial to the inframammary fold of the left breast.      09/20/2017 Initial Biopsy    Diagnosis 09/20/17 Soft Tissue Needle Core Biopsy, inferior medial to IMF - ADENOCARCINOMA. - SEE MICROSCOPIC DESCRIPTION.  Microscopic Comment Immunohistochemistry will be performed and reported as an addendum. (JDP:ah 09/23/17) ADDENDUM: Immunohistochemistry shows the tumor is strongly positive with cytokeratin AE1/AE3, cytokeratin 7 and shows moderate weak positivity with CDX-2. The tumor is negative with estrogen receptor, progesterone receptor, GCDFP, GATA-3, Napsin A, TTF-1, WT-1 and cytokeratin 20. The immunophenotype is consistent with metastatic carcinoma. The combination of CDX-2 and cytokeratin 7 positivity raises the possibility of an upper gastrointestinal primary. Clinical correlation is essential.    10/06/2017 Initial Diagnosis    Metastatic adenocarcinoma involving soft tissue with unknown primary site [facility])    10/10/2017 Procedure    Upper Endoscopy by Dr. Silverio Lang 10/10/17  IMPRESSION - Normal esophagus. - Z-line regular, 35 cm from the incisors. - Gastric bypass with a normal-sized pouch and intact staple line. Gastrojejunal anastomosis characterized by healthy appearing mucosa. - No specimens collected.    10/12/2017 Imaging    CT  CAP WO Contrast 10/12/17  IMPRESSION: 7 cm central right lung mass involving the right hilum, with postobstructive collapse of the right middle lobe, highly suspicious for primary bronchogenic carcinoma. 5 cm masslike opacity in the superior segment of the right lower lobe may represent carcinoma or postobstructive pneumonitis. 3.3 cm soft tissue mass in the lower anterior chest wall soft tissues, suspicious for metastatic disease. No evidence of abdominal or pelvic metastatic disease.     10/14/2017 Imaging    MRI Brain 10/14/17  IMPRESSION: 1. No metastatic disease or acute intracranial abnormality. Normal MRI appearance of the brain. 2. 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      10/17/2017 Cancer Staging    Staging form: Lung, AJCC 8th Edition - Clinical stage from 10/17/2017: Stage IV (cT3, cN1, cM1b) - Signed by Yvonne Merle, MD on 10/17/2017    11/08/2017 -  Chemotherapy    The patient had palonosetron (ALOXI) injection 0.25 mg, 0.25 mg, Intravenous,  Once, 2 of 4 cycles Administration: 0.25 mg (11/09/2017), 0.25 mg (12/01/2017) pegfilgrastim-cbqv (UDENYCA) injection 6 mg, 6 mg, Subcutaneous, Once, 2 of 4 cycles Administration: 6 mg (11/11/2017) bevacizumab (AVASTIN) 1,300 mg in sodium chloride 0.9 % 100 mL chemo infusion, 15 mg/kg = 1,300 mg (100 % of original dose 15 mg/kg), Intravenous,  Once, 1 of 3 cycles Dose modification: 15 mg/kg (original dose 15 mg/kg, Cycle 2) Administration: 1,300 mg (12/01/2017) CARBOplatin (PARAPLATIN) 540 mg in sodium chloride 0.9 % 250 mL chemo infusion, 540 mg (100 % of original dose 543.2 mg), Intravenous,  Once, 2 of 4 cycles Dose modification:   (original dose 543.2 mg, Cycle 1) Administration: 540 mg (11/09/2017), 680 mg (12/01/2017) PACLitaxel (TAXOL) 306 mg  in sodium chloride 0.9 % 500 mL chemo infusion (> 49m/m2), 150 mg/m2 = 306 mg (100 % of original dose 150 mg/m2), Intravenous,  Once, 2 of 4 cycles Dose modification: 175 mg/m2  (original dose 150 mg/m2, Cycle 1, Reason: Provider Judgment), 150 mg/m2 (original dose 150 mg/m2, Cycle 1, Reason: Provider Judgment) Administration: 306 mg (11/09/2017), 354 mg (12/01/2017) fosaprepitant (EMEND) 150 mg, dexamethasone (DECADRON) 12 mg in sodium chloride 0.9 % 145 mL IVPB, , Intravenous,  Once, 2 of 4 cycles Administration:  (11/09/2017),  (12/01/2017) atezolizumab (TECENTRIQ) 1,200 mg in sodium chloride 0.9 % 250 mL chemo infusion, 1,200 mg, Intravenous, Once, 2 of 4 cycles Administration: 1,200 mg (11/09/2017)  for chemotherapy treatment.      HISTORY OF PRESENTING ILLNESS (10/04/2017):  Yvonne Lang who was referred to me by her PCP Dr. FGerarda Lang She is here today for newly diagnosed metastatic adenocarcinoma.  She is accompanied by her sister and her mother to my clinic today.    She initially presented with a palpable mass inferior medical to the inframammary fold of the left breast about 3 months ago,  which was recently biopsied and revealed metastatic adenocarcinoma, favor upper GI origin.  She noticed the breast knot 3 months ago and it was a size of a nickel initially, and it has increased in size over the past 3 months.  She was seen by a primary care physician who referred her mammogram, which reviewed no mass in the breasts, and biopsy of the chest wall mass was performed.  She has a gastric bypass in early 2000s and is having GERD that are increasing in severity now. She uses sucralfate and omeprazole more frequently. It is associated with sharp knife-like epigastric pain that is getting worse and more frequent now and lasts 10 minutes and comes at least once a week. She sometimes gets dysphagia, but it's neither constant nor significant. No other abdominal pain. She also has new onset headaches in the frontal area that is constant and are 6-7/10 for the past 2 to 3 weeks.  She never had these headaches before and she uses Tylenol that provides minimal relief.      For pertinent past medical history, she also had 10-12 inches removed from her colon. No melena or hematochezia. No hematuria. No changes in bowel habits. She's been having back pains that are getting worse. She takes Tylenol for it and it helps. She reports history of back surgery. She was also diagnosed with COPD, depression, and anxiety.   She was on 3 months of progesterone and estriol for hot flashes and stopped  When she found about the new breast mass 3 weeks ago.   Her sister died of skin cancer. Her uncle had colon cancer at age 53  She lives with her mother. No children. No alcohol. She chews a nicotine gum as she's trying to quit smoking. She's been smoking since age 52   CURRENT THERAPY: First line chemo with carboplatin, paclitaxel, Atezolizumab and bevacizumab every 3 weeks  INTERVAL HISTORY  DAlvia Jablonskiis here for a follow up. She is here alone at the clinic. She is wearing a mask to protect herself from infections and states that she wears a mask when she leaves the house or joins crowded places. She is complaining of bone pain with last treatment even with Claritin pre-treatment. The pain lasted 3-4 days and she took Tramadol and Tylenol, that provided minimal relief. Her pain has now improved. She states that she  experienced nausea with last chemo, but otherwise tolerating well.   She is currently on 20 mg prednisone and is trying to taper it off. It helps her nausea and bone pain, even though she takes it for her SOB. No cough.    MEDICAL HISTORY:  Past Medical History:  Diagnosis Date  . Anxiety   . Bronchitis   . COPD (chronic obstructive pulmonary disease) ([place])   . Depression   . GERD (gastroesophageal reflux disease)   . H/O gastric bypass    1999  . Irritable bowel syndrome   . Lung cancer [facility])     SURGICAL HISTORY: Past Surgical History:  Procedure Laterality Date  . BACK SURGERY    . CHOLECYSTECTOMY    . COLON SURGERY     for a kink in  her colon and 12 inchs removed  . COLONOSCOPY N/A 12/13/2013   Procedure: COLONOSCOPY;  Surgeon: Rogene Houston, MD;  Location: [facility] ENDO SU[facility];  Service: Endoscopy;  Laterality: N/A;  200  . ESOPHAGOGASTRODUODENOSCOPY N/A 12/13/2013   Procedure: ESOPHAGOGASTRODUODENOSCOPY (EGD);  Surgeon: Rogene Houston, MD;  Location: [facility] ENDO SUITE;  Service: Endoscopy;  Laterality: N/A;  . Gastric Bypas  2000  . IR IMAGING GUIDED PORT INSERTION  11/14/2017    SOCIAL HISTORY: Social History   Socioeconomic History  . Marital status: Single    Spouse name: Not on file  . Number of children: Not on file  . Years of education: Not on file  . Highest education level: Not on file  Occupational History  . Not on file  Social Needs  . Financial resource strain: Not on file  . Food insecurity:    Worry: Not on file    Inability: Not on file  . Transportation needs:    Medical: Not on file    Non-medical: Not on file  Tobacco Use  . Smoking status: Current Every Day Smoker    Packs/day: 1.00    Years: 40.00    Pack years: 40.00    Types: Cigarettes  . Smokeless tobacco: Never Used  . Tobacco comment: Pack a day x 30 yrs. Trying to stop smoking using nicotine gum.   Substance and Sexual Activity  . Alcohol use: No  . Drug use: No  . Sexual activity: Not Currently    Birth control/protection: Post-menopausal  Lifestyle  . Physical activity:    Days per week: Not on file    Minutes per session: Not on file  . Stress: Not on file  Relationships  . Social connections:    Talks on phone: Not on file    Gets together: Not on file    Attends religious service: Not on file    Active member of club or organization: Not on file    Attends meetings of clubs or organizations: Not on file    Relationship status: Not on file  . Intimate partner violence:    Fear of current or ex partner: Not on file    Emotionally abused: Not on file    Physically abused: Not on file    Forced sexual activity: Not on  file  Other Topics Concern  . Not on file  Social History Narrative  . Not on file    FAMILY HISTORY: Family History  Problem Relation Age of Onset  . Other Mother        has a pacemaker; rectal prolapse; rods in legs; lung fibrosis   . Other Father  heart gave out  . Hypertension Sister   . Bronchitis Sister   . Heart attack Paternal Grandmother   . COPD Maternal Grandmother   . Emphysema Maternal Grandmother   . Alcoholism Maternal Grandfather   . Cancer Sister        rare skin cancer  . Cancer Maternal Uncle 7       colon cancer   . Colon cancer Maternal Uncle     ALLERGIES:  is allergic to codeine.  MEDICATIONS:  Current Outpatient Medications  Medication Sig Dispense Refill  . albuterol (PROVENTIL HFA) 108 (90 Base) MCG/ACT inhaler Inhale 2 puffs into the lungs 2 (two) times daily.     Marland Kitchen albuterol (PROVENTIL) 4 MG tablet Take 4 mg by mouth 2 (two) times daily.     Marland Kitchen ALPRAZolam (XANAX) 0.25 MG tablet Take 1 tablet (0.25 mg total) by mouth at bedtime as needed for anxiety. 30 tablet 0  . Ascorbic Acid (VITAMIN C) 100 MG tablet Take 100 mg by mouth daily.    Marland Kitchen aspirin 81 MG tablet Take 81 mg by mouth daily.    . butalbital-acetaminophen-caffeine (FIORICET, ESGIC) 50-325-40 MG tablet Take 1-2 tablets by mouth every 6 (six) hours as needed for headache. 40 tablet 1  . CALCIUM PO Take by mouth daily.    Marland Kitchen dicyclomine (BENTYL) 20 MG tablet Take 1 tablet (20 mg total) by mouth every 6 (six) hours. 60 tablet 1  . DULoxetine (CYMBALTA) 60 MG capsule Take 60 mg by mouth daily.    Marland Kitchen estradiol (ESTRACE) 2 MG tablet Take 1 tablet (2 mg total) by mouth daily. 30 tablet 6  . gabapentin (NEURONTIN) 300 MG capsule TAKE 1 CAPSULE BY MOUTH 4 TIMES DAILY  4  . lidocaine-prilocaine (EMLA) cream Apply 1 application topically as needed. Apply to port one hour prior to port access/chemo 30 g 2  . methocarbamol (ROBAXIN) 750 MG tablet Take 750 mg by mouth 4 (four) times daily.    .  mirtazapine (REMERON) 7.5 MG tablet Take 1 tablet (7.5 mg total) by mouth at bedtime. 30 tablet 1  . Multiple Vitamin (MULTIVITAMIN) tablet Take 1 tablet by mouth daily.    . Omega-3 Fatty Acids (FISH OIL) 600 MG CAPS Take 600 mg by mouth daily.     Marland Kitchen omeprazole (PRILOSEC) 20 MG capsule Take 1 capsule (20 mg total) by mouth 2 (two) times daily before a meal. 60 capsule 0  . ondansetron (ZOFRAN) 8 MG tablet Take 1 tablet (8 mg total) by mouth every 8 (eight) hours as needed for nausea or vomiting. 30 tablet 1  . potassium chloride SA (K-DUR,KLOR-CON) 20 MEQ tablet Take 1 tablet (20 mEq total) by mouth daily. 60 tablet 0  . predniSONE (DELTASONE) 10 MG tablet Take 4 tablets (40 mg total) by mouth daily with breakfast. 56 tablet 0  . Probiotic Product (PROBIOTIC-10 PO) Take by mouth.    . prochlorperazine (COMPAZINE) 10 MG tablet Take 1 tablet (10 mg total) by mouth every 6 (six) hours as needed for nausea or vomiting. 30 tablet 0  . progesterone (PROMETRIUM) 200 MG capsule Take 1 daily at HS 30 capsule 6  . RaNITidine HCl (ZANTAC PO) Take by mouth 3 (three) times daily.    Marland Kitchen rOPINIRole (REQUIP) 0.5 MG tablet TAKE 1 TABLET BY MOUTH AT BEDTIME FOR 30 DAYS  11  . sucralfate (CARAFATE) 1 g tablet Take 1 tablet (1 g total) by mouth 4 (four) times daily. 120 tablet 1  .  traMADol (ULTRAM) 50 MG tablet Take 1 tablet (50 mg total) by mouth every 6 (six) hours as needed. 90 tablet 0  . TRELEGY ELLIPTA 100-62.5-25 MCG/INH AEPB INHALE 1 PUFF INTO THE LUNGS EVERY DAY  5  . UNABLE TO FIND Inhale into the lungs. Med Name: Trilogy as needed     No current facility-administered medications for this visit.    Facility-Administered Medications Ordered in Other Visits  Medication Dose Route Frequency Provider Last Rate Last Dose  . sodium chloride flush (NS) 0.9 % injection 10 mL  10 mL Intracatheter PRN Yvonne Merle, MD   10 mL at 12/01/17 1628    REVIEW OF SYSTEMS:  Constitutional: Denies fevers, chills or  abnormal night sweats (+) frontal headaches, stable Eyes: Denies blurriness of vision, double vision or watery eyes Ears, nose, mouth, throat, and face: Denies mucositis or sore throat Respiratory: Denies cough, (+) SOB and Wheezing, improved (+) chest wall nodule Cardiovascular: Denies palpitation, chest discomfort or lower extremity swelling Gastrointestinal:  Denies heartburn or change in bowel habits (+) history of gastric bypass (+) nausea, mild Skin: Denies abnormal skin rashes Lymphatics: Denies new lymphadenopathy or easy bruising Neurological:Denies numbness, tingling or new weaknesses Behavioral/Psych: Mood is stable, no new changes (+) history of depression and anxiety, only feels stressed now  MSK: (+) bone pain with injection  All other systems were reviewed with the patient and are negative.  PHYSICAL EXAMINATION: ECOG PERFORMANCE STATUS: 2  Vitals:   12/01/17 0902  BP: 111/87  Pulse: (!) 107  Resp: 17  Temp: 98.6 F (37 C)  SpO2: 98%   Filed Weights   12/01/17 0902  Weight: 191 lb 1.6 oz (86.7 kg)    GENERAL:alert, no distress and comfortable (+) wearing mask  SKIN: skin color, texture, turgor are normal, no rashes or significant lesions EYES: normal, conjunctiva are pink and non-injected, sclera clear OROPHARYNX:no exudate, no erythema and lips, buccal mucosa, and tongue normal  NECK: supple, thyroid normal size, non-tender, without nodularity LYMPH:  no palpable lymphadenopathy in the cervical, axillary or inguinal LUNGS: clear to percussion. Clear on auscultation  HEART: regular rate & rhythm and no murmurs and no lower extremity edema ABDOMEN:abdomen soft, non-tender and normal bowel sounds Musculoskeletal:no cyanosis of digits and no clubbing  PSYCH: alert & oriented x 3 with fluent speech NEURO: no focal motor/sensory deficits   LABORATORY DATA:  I have reviewed the data as listed CBC Latest Ref Rng & Units 12/01/2017 11/18/2017 11/14/2017  WBC 3.9 -  10.3 K/uL 14.1(H) 28.2(H) 32.9(H)  Hemoglobin 11.6 - 15.9 g/dL 10.3(L) 10.2(L) 10.6(L)  Hematocrit 34.8 - 46.6 % 33.3(L) 32.9(L) 33.5(L)  Platelets 145 - 400 K/uL 465(H) 352 414(H)   CMP Latest Ref Rng & Units 12/01/2017 11/18/2017 11/09/2017  Glucose 70 - 99 mg/dL 103(H) 105(H) 94  BUN 6 - 20 mg/dL '6 6 7  ' Creatinine 0.44 - 1.00 mg/dL 0.64 0.61 0.60  Sodium 135 - 145 mmol/L 138 140 136  Potassium 3.5 - 5.1 mmol/L 3.9 3.5 3.5  Chloride 98 - 111 mmol/L 101 102 99  CO2 22 - 32 mmol/L '30 28 27  ' Calcium 8.9 - 10.3 mg/dL 8.4(L) 8.9 8.2(L)  Total Protein 6.5 - 8.1 g/dL 6.3(L) 6.4(L) 6.6  Total Bilirubin 0.3 - 1.2 mg/dL <0.2(L) <0.2(L) <0.2(L)  Alkaline Phos 38 - 126 U/L 113 191(H) 126  AST 15 - 41 U/L 18 18 33  ALT 0 - 44 U/L 18 36 29   Tumor Markers CEA Results for  CHRISANNE, LOOSE (MRN [id number]) as of 11/29/2017 13:15  Ref. Range 10/04/2017 12:46  CEA ([facility]) Latest Ref Range: 0.00 - 5.00 ng/mL 38.09 (H)    Pathology  09/20/2017 Breast Biopsy Diagnosis Soft Tissue Needle Core Biopsy, inferior medial to IMF - ADENOCARCINOMA. - SEE MICROSCOPIC DESCRIPTION. Microscopic Comment Immunohistochemistry will be performed and reported as an addendum. (JDP:ah 09/23/17) ADDENDUM: Immunohistochemistry shows the tumor is strongly positive with cytokeratin AE1/AE3, cytokeratin 7 and shows moderate weak positivity with CDX-2. The tumor is negative with estrogen receptor, progesterone receptor, GCDFP, GATA-3, Napsin A, TTF-1, WT-1 and cytokeratin 20. The immunophenotype is consistent with metastatic carcinoma. The combination of CDX-2 and cytokeratin 7 positivity raises the possibility of an upper gastrointestinal primary. Clinical correlation is essential.ical Information Specimen(s) Obtained: Soft Tissue Needle Core Biopsy, inferior medial to IMF Specimen Clinical Information not definitely in the breast palpable mass x 1 month; ? lymph node r/o malignancy Gross Received in two containers (one  with formalin, one with saline) are three cores of gray-white to dark soft firm tissue (one core in saline, two in formalin) which range from 1 x 0.1 cm to 1.3 x 0.1 cm. The core from saline is submitted in RPMI for possible flow cytometry, and two cores from formalin are submitted in one block for routine histology.     PROCEDURES  Upper Endoscopy by Dr. Silverio Lang 10/10/17  IMPRESSION - Normal esophagus. - Z-line regular, 35 cm from the incisors. - Gastric bypass with a normal-sized pouch and intact staple line. Gastrojejunal anastomosis characterized by healthy appearing mucosa. - No specimens collected.   RADIOGRAPHIC STUDIES: I have personally reviewed the radiological images as listed and agreed with the findings in the report. Ir Imaging Guided Port Insertion  Result Date: 11/14/2017 INDICATION: History of metastatic lung cancer, in need of durable intravenous access for chemotherapy administration. EXAM: IMPLANTED PORT A CATH PLACEMENT WITH ULTRASOUND AND FLUOROSCOPIC GUIDANCE COMPARISON:  Chest CT - 10/11/2017 MEDICATIONS: Ancef 2 gm IV; The antibiotic was administered within an appropriate time interval prior to skin puncture. ANESTHESIA/SEDATION: Moderate (conscious) sedation was employed during this procedure. A total of Versed 6 mg and Fentanyl 200 mcg was administered intravenously. Moderate Sedation Time: 24 minutes. The patient's level of consciousness and vital signs were monitored continuously by radiology nursing throughout the procedure under my direct supervision. CONTRAST:  None FLUOROSCOPY TIME:  30 seconds (24 mGy) COMPLICATIONS: None immediate. PROCEDURE: The procedure, risks, benefits, and alternatives were explained to the patient. Questions regarding the procedure were encouraged and answered. The patient understands and consents to the procedure. The right neck and chest were prepped with chlorhexidine in a sterile fashion, and a sterile drape was applied covering the  operative field. Maximum barrier sterile technique with sterile gowns and gloves were used for the procedure. A timeout was performed prior to the initiation of the procedure. Local anesthesia was provided with 1% lidocaine with epinephrine. After creating a small venotomy incision, a micropuncture kit was utilized to access the internal jugular vein. Real-time ultrasound guidance was utilized for vascular access including the acquisition of a permanent ultrasound image documenting patency of the accessed vessel. The microwire was utilized to measure appropriate catheter length. A subcutaneous port pocket was then created along the upper chest wall utilizing a combination of sharp and blunt dissection. The pocket was irrigated with sterile saline. A single lumen ISP power injectable port was chosen for placement. The 8 Fr catheter was tunneled from the port pocket site to the venotomy incision. The  port was placed in the pocket. The external catheter was trimmed to appropriate length. At the venotomy, an 8 Fr peel-away sheath was placed over a guidewire under fluoroscopic guidance. The catheter was then placed through the sheath and the sheath was removed. Final catheter positioning was confirmed and documented with a fluoroscopic spot radiograph. The port was accessed with a Huber needle, aspirated and flushed with heparinized saline. The venotomy site was closed with an interrupted 4-0 Vicryl suture. The port pocket incision was closed with interrupted 2-0 Vicryl suture and the skin was opposed with a running subcuticular 4-0 Vicryl suture. Dermabond and Steri-strips were applied to both incisions. Dressings were placed. The patient tolerated the procedure well without immediate post procedural complication. FINDINGS: After catheter placement, the tip lies within the superior cavoatrial junction. The catheter aspirates and flushes normally and is ready for immediate use. IMPRESSION: Successful placement of a right  internal jugular approach power injectable Port-A-Cath. The catheter is ready for immediate use. Electronically Signed   By: Sandi Mariscal M.D.   On: 11/14/2017 12:40    ASSESSMENT & PLAN:  Yvonne Lang is a 52 y.o. Lang with history of bipolar II and lumbosacral radiculitis.   1. 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  -I previously reviewed her mammogram and her chest wall mas biopsy results with patient and her family members.  -The biopsy of the chest wall mass inferior medial to left breast from 09/20/2017 revealed adenocarcinoma. The Immunohistochemistry shows the tumor is strongly positive with cytokeratin AE1/AE3, cytokeratin 7 and shows moderate weak positivity with CDX-2. The tumor is negative with estrogen receptor, progesterone receptor, GCDFP, GATA-3, Napsin A, TTF-1, WT-1 and cytokeratin 20. The immunophenotype is consistent with metastatic carcinoma, favor upper GI origin.  -She had a history of gastric bypass in the past, chronic GERD, and has worsening gastric pain and burning sensation lately, which requires high-dose antacid medications.  She underwent EGD which was negative for malignancy. -I reviewed her staging CT images and Brain MRI from 09/2017 with patient and her family in person.  She has a large right hilar/lower lobe mass, with obstruction to the right middle lobe.  This is consistent with primary lung cancer. Unfortunately, her CT scan was done without contrast, due to the difficulty to access her vein -PET scan was denied by her insurance -Due to her worsening dyspnea, she received a short course of palliative radiation to her lung mass. -She has started to systemic chemotherapy with carboplatin, paclitaxel, Atezolizumab and bevacizumab.  Bevacizumab was held for first cycle due to pending port placement.  Chemo dose was slightly decreased for cycle 1 due to her limited performance status. -She tolerated first cycle chemotherapy well, with  main complaint of bone pain from Neulasta injection.  Mild nausea and fatigue were manageable. -Her prednisone has gradually decreased from 40 mg to 20 mg daily, her dyspnea has been stable. -Due to her chronic steroid use, I will hold Atezo for cycle 2, if her prednisone can be tapered down to 10 mg or less daily, will add back from next cycle. -Plan to repeat CT chest abdomen after cycle 3.  2. New onset frontal headaches   -She denies history of previous or chronic headaches, now presented with persistent headaches for the past 2 to 3 weeks. -10/14/17 Brain MRI shows no metastatic disease but shows C3-C4 disc and endplate degeneration.  -Tramadol doe not effect her headaches but she is taking Fioricet with some relief. -she was  seen by Dr. Mickeal Skinner --improved lately   3. Anxiety and Depression - I previously prescribed alprazolam (Xanax) and lorazepam on 10/04/17 -She declines being depressed, but notes to being stressed.  -She is willing to start Xanax for her stress  4. GERD - I previously prescribed sucralfate and pantoprazole on 10/04/17  5. Chronic back pain  -pain stable  -On tramadol 50 mg q6hours, I refilled previously   6. Low appetite, Nausea  -I offered her the option of Mirtazapine for appetite stimulant which is also antidepressant. She is interested, I prescribed on 10/17/17 -I prescribed her antiemetics,  compazine and Zofran on 10/17/2017 -Continue mirtazapine and antiemetics, appetite has improved, nausea is manageable.    Plan: -Lab reviewed, adequate for treatment, will proceed to cycle 2 carboplatin and Taxol, will increase to full dose. Will add AVastin today, and hold Atezolizumab due to her steroids use, will restart if her prednisone can be tapered down to 10 mg or less daily -f/u with lacie and cycle 3 chemo in 3 weeks -plan to repeat CT chest and abdomen with contrast after cycle 3 chemotherapy    No orders of the defined types were placed in this  encounter.   All questions were answered. The patient knows to call the clinic with any problems, questions or concerns. I spent 20 minutes counseling the patient face to face. The total time spent in the appointment was 25 minutes and more than 50% was on counseling.  Her sister and her mother had multiple questions, I answered to the best of my knowledge.  Dierdre Searles Dweik am acting as scribe for Dr. Truitt Lang.  I have reviewed the above documentation for accuracy and completeness, and I agree with the above.     Yvonne Merle, MD 12/01/2017 5:40 PM

## 2017-12-01 ENCOUNTER — Ambulatory Visit: Payer: 59 | Admitting: Gastroenterology

## 2017-12-01 ENCOUNTER — Encounter: Payer: Self-pay | Admitting: Hematology

## 2017-12-01 ENCOUNTER — Other Ambulatory Visit: Payer: Self-pay | Admitting: *Deleted

## 2017-12-01 ENCOUNTER — Inpatient Hospital Stay: Payer: No Typology Code available for payment source

## 2017-12-01 ENCOUNTER — Inpatient Hospital Stay: Payer: No Typology Code available for payment source | Attending: Hematology

## 2017-12-01 ENCOUNTER — Inpatient Hospital Stay (HOSPITAL_BASED_OUTPATIENT_CLINIC_OR_DEPARTMENT_OTHER): Payer: No Typology Code available for payment source | Admitting: Hematology

## 2017-12-01 VITALS — BP 95/65 | HR 90 | Temp 98.2°F | Resp 18

## 2017-12-01 VITALS — BP 111/87 | HR 107 | Temp 98.6°F | Resp 17 | Ht 67.0 in | Wt 191.1 lb

## 2017-12-01 DIAGNOSIS — Z5111 Encounter for antineoplastic chemotherapy: Secondary | ICD-10-CM | POA: Insufficient documentation

## 2017-12-01 DIAGNOSIS — R110 Nausea: Secondary | ICD-10-CM | POA: Diagnosis not present

## 2017-12-01 DIAGNOSIS — Z923 Personal history of irradiation: Secondary | ICD-10-CM

## 2017-12-01 DIAGNOSIS — G8929 Other chronic pain: Secondary | ICD-10-CM | POA: Diagnosis not present

## 2017-12-01 DIAGNOSIS — F3181 Bipolar II disorder: Secondary | ICD-10-CM | POA: Diagnosis not present

## 2017-12-01 DIAGNOSIS — R51 Headache: Secondary | ICD-10-CM

## 2017-12-01 DIAGNOSIS — Z7982 Long term (current) use of aspirin: Secondary | ICD-10-CM | POA: Diagnosis not present

## 2017-12-01 DIAGNOSIS — Z7952 Long term (current) use of systemic steroids: Secondary | ICD-10-CM

## 2017-12-01 DIAGNOSIS — Z79899 Other long term (current) drug therapy: Secondary | ICD-10-CM | POA: Diagnosis not present

## 2017-12-01 DIAGNOSIS — J449 Chronic obstructive pulmonary disease, unspecified: Secondary | ICD-10-CM

## 2017-12-01 DIAGNOSIS — K219 Gastro-esophageal reflux disease without esophagitis: Secondary | ICD-10-CM | POA: Diagnosis not present

## 2017-12-01 DIAGNOSIS — C3490 Malignant neoplasm of unspecified part of unspecified bronchus or lung: Secondary | ICD-10-CM

## 2017-12-01 DIAGNOSIS — F17210 Nicotine dependence, cigarettes, uncomplicated: Secondary | ICD-10-CM | POA: Insufficient documentation

## 2017-12-01 DIAGNOSIS — Z9884 Bariatric surgery status: Secondary | ICD-10-CM | POA: Insufficient documentation

## 2017-12-01 DIAGNOSIS — C7989 Secondary malignant neoplasm of other specified sites: Secondary | ICD-10-CM | POA: Insufficient documentation

## 2017-12-01 DIAGNOSIS — Z5112 Encounter for antineoplastic immunotherapy: Secondary | ICD-10-CM | POA: Diagnosis present

## 2017-12-01 DIAGNOSIS — F419 Anxiety disorder, unspecified: Secondary | ICD-10-CM

## 2017-12-01 DIAGNOSIS — Z800 Family history of malignant neoplasm of digestive organs: Secondary | ICD-10-CM

## 2017-12-01 DIAGNOSIS — C3431 Malignant neoplasm of lower lobe, right bronchus or lung: Secondary | ICD-10-CM

## 2017-12-01 DIAGNOSIS — Z5189 Encounter for other specified aftercare: Secondary | ICD-10-CM | POA: Insufficient documentation

## 2017-12-01 LAB — CBC WITH DIFFERENTIAL (CANCER CENTER ONLY)
BASOS PCT: 0 %
Basophils Absolute: 0.1 10*3/uL (ref 0.0–0.1)
EOS ABS: 0.1 10*3/uL (ref 0.0–0.5)
Eosinophils Relative: 1 %
HCT: 33.3 % — ABNORMAL LOW (ref 34.8–46.6)
HEMOGLOBIN: 10.3 g/dL — AB (ref 11.6–15.9)
Lymphocytes Relative: 4 %
Lymphs Abs: 0.6 10*3/uL — ABNORMAL LOW (ref 0.9–3.3)
MCH: 27.7 pg (ref 25.1–34.0)
MCHC: 30.9 g/dL — ABNORMAL LOW (ref 31.5–36.0)
MCV: 89.5 fL (ref 79.5–101.0)
Monocytes Absolute: 1.2 10*3/uL — ABNORMAL HIGH (ref 0.1–0.9)
Monocytes Relative: 8 %
NEUTROS PCT: 87 %
Neutro Abs: 12.2 10*3/uL — ABNORMAL HIGH (ref 1.5–6.5)
Platelet Count: 465 10*3/uL — ABNORMAL HIGH (ref 145–400)
RBC: 3.72 MIL/uL (ref 3.70–5.45)
RDW: 16.6 % — ABNORMAL HIGH (ref 11.2–14.5)
WBC: 14.1 10*3/uL — AB (ref 3.9–10.3)

## 2017-12-01 LAB — CMP (CANCER CENTER ONLY)
ALT: 18 U/L (ref 0–44)
AST: 18 U/L (ref 15–41)
Albumin: 2.7 g/dL — ABNORMAL LOW (ref 3.5–5.0)
Alkaline Phosphatase: 113 U/L (ref 38–126)
Anion gap: 7 (ref 5–15)
BUN: 6 mg/dL (ref 6–20)
CALCIUM: 8.4 mg/dL — AB (ref 8.9–10.3)
CO2: 30 mmol/L (ref 22–32)
CREATININE: 0.64 mg/dL (ref 0.44–1.00)
Chloride: 101 mmol/L (ref 98–111)
Glucose, Bld: 103 mg/dL — ABNORMAL HIGH (ref 70–99)
Potassium: 3.9 mmol/L (ref 3.5–5.1)
Sodium: 138 mmol/L (ref 135–145)
Total Bilirubin: <0.2 mg/dL — ABNORMAL LOW (ref 0.3–1.2)
Total Protein: 6.3 g/dL — ABNORMAL LOW (ref 6.5–8.1)

## 2017-12-01 LAB — TOTAL PROTEIN, URINE DIPSTICK: Protein, ur: NEGATIVE mg/dL

## 2017-12-01 MED ORDER — SODIUM CHLORIDE 0.9 % IV SOLN
Freq: Once | INTRAVENOUS | Status: AC
  Administered 2017-12-01: 11:00:00 via INTRAVENOUS
  Filled 2017-12-01: qty 5

## 2017-12-01 MED ORDER — SODIUM CHLORIDE 0.9 % IV SOLN
Freq: Once | INTRAVENOUS | Status: AC
  Administered 2017-12-01: 10:00:00 via INTRAVENOUS
  Filled 2017-12-01: qty 250

## 2017-12-01 MED ORDER — FAMOTIDINE IN NACL 20-0.9 MG/50ML-% IV SOLN
INTRAVENOUS | Status: AC
  Filled 2017-12-01: qty 50

## 2017-12-01 MED ORDER — FAMOTIDINE IN NACL 20-0.9 MG/50ML-% IV SOLN
20.0000 mg | Freq: Once | INTRAVENOUS | Status: AC
  Administered 2017-12-01: 20 mg via INTRAVENOUS

## 2017-12-01 MED ORDER — SODIUM CHLORIDE 0.9 % IV SOLN
679.0000 mg | Freq: Once | INTRAVENOUS | Status: AC
  Administered 2017-12-01: 680 mg via INTRAVENOUS
  Filled 2017-12-01: qty 68

## 2017-12-01 MED ORDER — SODIUM CHLORIDE 0.9 % IV SOLN: 1200.0000 mg | Freq: Once | INTRAVENOUS | Status: DC

## 2017-12-01 MED ORDER — HEPARIN SOD (PORK) LOCK FLUSH 100 UNIT/ML IV SOLN
500.0000 [IU] | Freq: Once | INTRAVENOUS | Status: AC | PRN
  Administered 2017-12-01: 500 [IU]
  Filled 2017-12-01: qty 5

## 2017-12-01 MED ORDER — PALONOSETRON HCL INJECTION 0.25 MG/5ML
INTRAVENOUS | Status: AC
  Filled 2017-12-01: qty 5

## 2017-12-01 MED ORDER — PALONOSETRON HCL INJECTION 0.25 MG/5ML
0.2500 mg | Freq: Once | INTRAVENOUS | Status: AC
  Administered 2017-12-01: 0.25 mg via INTRAVENOUS

## 2017-12-01 MED ORDER — SODIUM CHLORIDE 0.9% FLUSH
10.0000 mL | INTRAVENOUS | Status: DC | PRN
  Administered 2017-12-01: 10 mL
  Filled 2017-12-01: qty 10

## 2017-12-01 MED ORDER — DIPHENHYDRAMINE HCL 50 MG/ML IJ SOLN
50.0000 mg | Freq: Once | INTRAMUSCULAR | Status: AC
  Administered 2017-12-01: 50 mg via INTRAVENOUS

## 2017-12-01 MED ORDER — DIPHENHYDRAMINE HCL 50 MG/ML IJ SOLN
INTRAMUSCULAR | Status: AC
  Filled 2017-12-01: qty 1

## 2017-12-01 MED ORDER — SODIUM CHLORIDE 0.9 % IV SOLN
15.0000 mg/kg | Freq: Once | INTRAVENOUS | Status: AC
  Administered 2017-12-01: 1300 mg via INTRAVENOUS
  Filled 2017-12-01: qty 48

## 2017-12-01 MED ORDER — SODIUM CHLORIDE 0.9 % IV SOLN
175.0000 mg/m2 | Freq: Once | INTRAVENOUS | Status: AC
  Administered 2017-12-01: 354 mg via INTRAVENOUS
  Filled 2017-12-01: qty 59

## 2017-12-01 NOTE — Progress Notes (Signed)
Spoke w/ Dr. Burr Medico. Holding atezolizumab today d/t patient being on prednisone 20mg /day currently.  Steroid is being tapered, so may restart atezoliuzmab next week depending on steroid dose at that time.   Demetrius Charity, PharmD Oncology Pharmacist Pharmacy Phone: [phone] 12/01/2017

## 2017-12-01 NOTE — Patient Instructions (Addendum)
[facility] Discharge Instructions for Patients Receiving Chemotherapy  Today you received the following chemotherapy agents Avastin, Taxol, and Carboplatin  To help prevent nausea and vomiting after your treatment, we encourage you to take your nausea medication as directed   If you develop nausea and vomiting that is not controlled by your nausea medication, call the clinic.   BELOW ARE SYMPTOMS THAT SHOULD BE REPORTED IMMEDIATELY:  *FEVER GREATER THAN 100.5 F  *CHILLS WITH OR WITHOUT FEVER  NAUSEA AND VOMITING THAT IS NOT CONTROLLED WITH YOUR NAUSEA MEDICATION  *UNUSUAL SHORTNESS OF BREATH  *UNUSUAL BRUISING OR BLEEDING  TENDERNESS IN MOUTH AND THROAT WITH OR WITHOUT PRESENCE OF ULCERS  *URINARY PROBLEMS  *BOWEL PROBLEMS  UNUSUAL RASH Items with * indicate a potential emergency and should be followed up as soon as possible.  Feel free to call the clinic should you have any questions or concerns. The clinic phone number is [phone].  Please show the [facility] at check-in to the Emergency Department and triage nurse.

## 2017-12-03 ENCOUNTER — Inpatient Hospital Stay: Payer: No Typology Code available for payment source

## 2017-12-03 VITALS — BP 115/79 | HR 88 | Temp 97.9°F | Resp 17

## 2017-12-03 DIAGNOSIS — Z5189 Encounter for other specified aftercare: Secondary | ICD-10-CM | POA: Diagnosis not present

## 2017-12-03 DIAGNOSIS — C3490 Malignant neoplasm of unspecified part of unspecified bronchus or lung: Secondary | ICD-10-CM

## 2017-12-03 MED ORDER — PEGFILGRASTIM-CBQV 6 MG/0.6ML ~~LOC~~ SOSY
PREFILLED_SYRINGE | SUBCUTANEOUS | Status: AC
  Filled 2017-12-03: qty 0.6

## 2017-12-03 MED ORDER — PEGFILGRASTIM-CBQV 6 MG/0.6ML ~~LOC~~ SOSY
6.0000 mg | PREFILLED_SYRINGE | Freq: Once | SUBCUTANEOUS | Status: AC
  Administered 2017-12-03: 6 mg via SUBCUTANEOUS

## 2017-12-03 NOTE — Patient Instructions (Signed)
Pegfilgrastim injection What is this medicine? PEGFILGRASTIM (PEG fil gra stim) is a long-acting granulocyte colony-stimulating factor that stimulates the growth of neutrophils, a type of white blood cell important in the body's fight against infection. It is used to reduce the incidence of fever and infection in patients with certain types of cancer who are receiving chemotherapy that affects the bone marrow, and to increase survival after being exposed to high doses of radiation. This medicine may be used for other purposes; ask your health care provider or pharmacist if you have questions. COMMON BRAND NAME(S): Neulasta What should I tell my health care provider before I take this medicine? They need to know if you have any of these conditions: -kidney disease -latex allergy -ongoing radiation therapy -sickle cell disease -skin reactions to acrylic adhesives (On-Body Injector only) -an unusual or allergic reaction to pegfilgrastim, filgrastim, other medicines, foods, dyes, or preservatives -pregnant or trying to get pregnant -breast-feeding How should I use this medicine? This medicine is for injection under the skin. If you get this medicine at home, you will be taught how to prepare and give the pre-filled syringe or how to use the On-body Injector. Refer to the patient Instructions for Use for detailed instructions. Use exactly as directed. Tell your healthcare provider immediately if you suspect that the On-body Injector may not have performed as intended or if you suspect the use of the On-body Injector resulted in a missed or partial dose. It is important that you put your used needles and syringes in a special sharps container. Do not put them in a trash can. If you do not have a sharps container, call your pharmacist or healthcare provider to get one. Talk to your pediatrician regarding the use of this medicine in children. While this drug may be prescribed for selected conditions,  precautions do apply. Overdosage: If you think you have taken too much of this medicine contact a poison control center or emergency room at once. NOTE: This medicine is only for you. Do not share this medicine with others. What if I miss a dose? It is important not to miss your dose. Call your doctor or health care professional if you miss your dose. If you miss a dose due to an On-body Injector failure or leakage, a new dose should be administered as soon as possible using a single prefilled syringe for manual use. What may interact with this medicine? Interactions have not been studied. Give your health care provider a list of all the medicines, herbs, non-prescription drugs, or dietary supplements you use. Also tell them if you smoke, drink alcohol, or use illegal drugs. Some items may interact with your medicine. This list may not describe all possible interactions. Give your health care provider a list of all the medicines, herbs, non-prescription drugs, or dietary supplements you use. Also tell them if you smoke, drink alcohol, or use illegal drugs. Some items may interact with your medicine. What should I watch for while using this medicine? You may need blood work done while you are taking this medicine. If you are going to need a MRI, CT scan, or other procedure, tell your doctor that you are using this medicine (On-Body Injector only). What side effects may I notice from receiving this medicine? Side effects that you should report to your doctor or health care professional as soon as possible: -allergic reactions like skin rash, itching or hives, swelling of the face, lips, or tongue -dizziness -fever -pain, redness, or irritation at site   where injected -pinpoint red spots on the skin -red or dark-brown urine -shortness of breath or breathing problems -stomach or side pain, or pain at the shoulder -swelling -tiredness -trouble passing urine or change in the amount of urine Side  effects that usually do not require medical attention (report to your doctor or health care professional if they continue or are bothersome): -bone pain -muscle pain This list may not describe all possible side effects. Call your doctor for medical advice about side effects. You may report side effects to FDA at 1-800-FDA-1088. Where should I keep my medicine? Keep out of the reach of children. Store pre-filled syringes in a refrigerator between 2 and 8 degrees C (36 and 46 degrees F). Do not freeze. Keep in carton to protect from light. Throw away this medicine if it is left out of the refrigerator for more than 48 hours. Throw away any unused medicine after the expiration date. NOTE: This sheet is a summary. It may not cover all possible information. If you have questions about this medicine, talk to your doctor, pharmacist, or health care provider.  2018 Elsevier/Gold Standard (2016-03-04 12:58:03)  

## 2017-12-04 ENCOUNTER — Encounter: Payer: Self-pay | Admitting: Nurse Practitioner

## 2017-12-05 ENCOUNTER — Telehealth: Payer: Self-pay | Admitting: Hematology

## 2017-12-05 ENCOUNTER — Encounter (HOSPITAL_COMMUNITY): Payer: 59

## 2017-12-05 ENCOUNTER — Encounter: Payer: Self-pay | Admitting: Hematology

## 2017-12-05 NOTE — Telephone Encounter (Signed)
No los 9/12

## 2017-12-06 ENCOUNTER — Encounter: Payer: Self-pay | Admitting: Urology

## 2017-12-06 ENCOUNTER — Other Ambulatory Visit: Payer: Self-pay

## 2017-12-06 DIAGNOSIS — C3490 Malignant neoplasm of unspecified part of unspecified bronchus or lung: Secondary | ICD-10-CM

## 2017-12-06 MED ORDER — PROCHLORPERAZINE MALEATE 10 MG PO TABS: 10.0000 mg | ORAL_TABLET | Freq: Four times a day (QID) | ORAL | 0 refills | Status: DC | PRN

## 2017-12-07 ENCOUNTER — Telehealth: Payer: Self-pay | Admitting: Hematology

## 2017-12-07 NOTE — Telephone Encounter (Signed)
Per 9/18 sch msg, called patient to reschedule injection appt to [facility], 10/7.

## 2017-12-08 ENCOUNTER — Encounter: Payer: Self-pay | Admitting: Nurse Practitioner

## 2017-12-09 ENCOUNTER — Encounter: Payer: Self-pay | Admitting: Hematology

## 2017-12-13 ENCOUNTER — Encounter: Payer: Self-pay | Admitting: Hematology

## 2017-12-19 ENCOUNTER — Other Ambulatory Visit: Payer: Self-pay | Admitting: Hematology

## 2017-12-19 ENCOUNTER — Ambulatory Visit (HOSPITAL_COMMUNITY): Payer: 59

## 2017-12-20 ENCOUNTER — Ambulatory Visit
Admission: RE | Admit: 2017-12-20 | Discharge: 2017-12-20 | Disposition: A | Discharge status: Home / Self Care | Payer: 59 | Source: Ambulatory Visit | Attending: Urology | Admitting: Urology

## 2017-12-20 ENCOUNTER — Other Ambulatory Visit: Payer: Self-pay

## 2017-12-20 ENCOUNTER — Encounter: Payer: Self-pay | Admitting: Urology

## 2017-12-20 VITALS — BP 130/92 | HR 99 | Temp 97.8°F | Resp 20 | Ht 67.0 in | Wt 189.6 lb

## 2017-12-20 DIAGNOSIS — Z7952 Long term (current) use of systemic steroids: Secondary | ICD-10-CM | POA: Diagnosis not present

## 2017-12-20 DIAGNOSIS — C3490 Malignant neoplasm of unspecified part of unspecified bronchus or lung: Secondary | ICD-10-CM | POA: Insufficient documentation

## 2017-12-20 DIAGNOSIS — Z885 Allergy status to narcotic agent status: Secondary | ICD-10-CM | POA: Diagnosis not present

## 2017-12-20 DIAGNOSIS — Z79899 Other long term (current) drug therapy: Secondary | ICD-10-CM | POA: Diagnosis not present

## 2017-12-20 DIAGNOSIS — Z7982 Long term (current) use of aspirin: Secondary | ICD-10-CM | POA: Insufficient documentation

## 2017-12-20 NOTE — Progress Notes (Signed)
Radiation Oncology         [phone] ________________________________  Name: Yvonne Lang MRN: [id number]  Date: 12/20/2017  DOB: 13-Dec-1965  Post Treatment Note  CC: Redmond School, MD  Truitt Merle, MD  Diagnosis:    52 y.o.woman with metastatic non-small cell lung cancer in the right hilum, stage IV (cT3, cN1, cM1b)    Interval Since Last Radiation:  7 weeks , Palliative XRT 10/24/2017 to 11/04/2017:  The Right lung was treated to 30 Gy in 10 fractions of 3 Gy.  Narrative:  The patient returns today for routine follow-up.  She tolerated radiation treatment relatively well.  She did experience pain on her right side and in her chest area as well as moderate to severe fatigue. She denied any difficulty with swallowing but reported shortness of breath with activity and a dry cough. She is currently receiving systemic chemotherapy under the care and direction of Dr. Burr Medico with first line carboplatin, paclitaxel and Atezolizumab every 3 weeks (dose was slightly decreased for cycle 1 due to her limited performance status)- s/p 2 cycles and tolerating well aside from c/o bone pains from Neulasta injection.  Bevacizumab was held for the first cycle of treatment due to pending port placement but was tolerated well at time of recent treatment on 12/01/17.  The Loletta Specter was held for cycle 2 due to her chronic steroid use which was being tapered with plans to add this medication back at cycle 3.  She has a planned follow-up with Dr. Burr Medico visit on 12/22/2017 in anticipation of beginning cycle 3 of her treatment.  Per Dr. Ernestina Penna most recent follow-up visit note, the plan is to repeat her systemic imaging after completion of cycle 3.                      On review of systems, the patient states that she is doing well overall.  She continues with nausea and fatigue secondary to her systemic treatments but otherwise is doing fairly well.  She also continues to complain of back pain which is relieved with  Neurontin.  She has weaned off the prednisone at this point- has not taken any prednisone in 48 hours.  She continues with shortness of breath with exertion and a dry cough which is unchanged recently.  She specifically denies chest pain, hemoptysis, productive cough or dyspnea.  She reports a decreased appetite but is maintaining her weight and denies any dysphagia.  She denies headaches, visual changes, fever, chills or night sweats.  ALLERGIES:  is allergic to codeine.  Meds: Current Outpatient Medications  Medication Sig Dispense Refill  . albuterol (PROVENTIL HFA) 108 (90 Base) MCG/ACT inhaler Inhale 2 puffs into the lungs 2 (two) times daily.     Marland Kitchen ALPRAZolam (XANAX) 0.25 MG tablet Take 1 tablet (0.25 mg total) by mouth at bedtime as needed for anxiety. 30 tablet 0  . butalbital-acetaminophen-caffeine (FIORICET, ESGIC) 50-325-40 MG tablet Take 1-2 tablets by mouth every 6 (six) hours as needed for headache. 40 tablet 1  . dicyclomine (BENTYL) 20 MG tablet Take 1 tablet (20 mg total) by mouth every 6 (six) hours. 60 tablet 1  . DULoxetine (CYMBALTA) 60 MG capsule Take 60 mg by mouth daily.    Marland Kitchen gabapentin (NEURONTIN) 300 MG capsule TAKE 1 CAPSULE BY MOUTH 4 TIMES DAILY  4  . lidocaine-prilocaine (EMLA) cream Apply 1 application topically as needed. Apply to port one hour prior to port access/chemo 30 g 2  .  mirtazapine (REMERON) 7.5 MG tablet Take 1 tablet (7.5 mg total) by mouth at bedtime. 30 tablet 1  . Multiple Vitamin (MULTIVITAMIN) tablet Take 1 tablet by mouth daily.    Marland Kitchen omeprazole (PRILOSEC) 20 MG capsule Take 1 capsule (20 mg total) by mouth 2 (two) times daily before a meal. 60 capsule 0  . ondansetron (ZOFRAN) 8 MG tablet Take 1 tablet (8 mg total) by mouth every 8 (eight) hours as needed for nausea or vomiting. 30 tablet 1  . potassium chloride SA (K-DUR,KLOR-CON) 20 MEQ tablet Take 1 tablet (20 mEq total) by mouth daily. 60 tablet 0  . predniSONE (DELTASONE) 10 MG tablet Take 4  tablets (40 mg total) by mouth daily with breakfast. 56 tablet 0  . prochlorperazine (COMPAZINE) 10 MG tablet Take 1 tablet (10 mg total) by mouth every 6 (six) hours as needed for nausea or vomiting. 30 tablet 0  . RaNITidine HCl (ZANTAC PO) Take by mouth 3 (three) times daily.    Marland Kitchen rOPINIRole (REQUIP) 0.5 MG tablet TAKE 1 TABLET BY MOUTH AT BEDTIME FOR 30 DAYS  11  . sucralfate (CARAFATE) 1 g tablet TAKE 1 TABLET(1 GRAM) BY MOUTH FOUR TIMES DAILY 120 tablet 1  . traMADol (ULTRAM) 50 MG tablet Take 1 tablet (50 mg total) by mouth every 6 (six) hours as needed. 90 tablet 0  . TRELEGY ELLIPTA 100-62.5-25 MCG/INH AEPB INHALE 1 PUFF INTO THE LUNGS EVERY DAY  5  . UNABLE TO FIND Inhale into the lungs. Med Name: Trilogy as needed    . albuterol (PROVENTIL) 4 MG tablet Take 4 mg by mouth 2 (two) times daily.     . Ascorbic Acid (VITAMIN C) 100 MG tablet Take 100 mg by mouth daily.    Marland Kitchen aspirin 81 MG tablet Take 81 mg by mouth daily.    Marland Kitchen CALCIUM PO Take by mouth daily.    Marland Kitchen estradiol (ESTRACE) 2 MG tablet Take 1 tablet (2 mg total) by mouth daily. (Patient not taking: Reported on 12/20/2017) 30 tablet 6  . methocarbamol (ROBAXIN) 750 MG tablet Take 750 mg by mouth 4 (four) times daily.    . Omega-3 Fatty Acids (FISH OIL) 600 MG CAPS Take 600 mg by mouth daily.     . Probiotic Product (PROBIOTIC-10 PO) Take by mouth.    . progesterone (PROMETRIUM) 200 MG capsule Take 1 daily at HS (Patient not taking: Reported on 12/20/2017) 30 capsule 6   No current facility-administered medications for this encounter.     Physical Findings:  height is 5\' 7"  (1.702 m) and weight is 189 lb 9.6 oz (86 kg). Her oral temperature is 97.8 F (36.6 C). Her blood pressure is 130/92 (abnormal) and her pulse is 99. Her respiration is 20 and oxygen saturation is 99%.  Pain Assessment Pain Score: 4  Pain Loc: Back/10 In general this is a well appearing Caucasian female in no acute distress.  She's alert and oriented x4 and  appropriate throughout the examination. Cardiopulmonary assessment is negative for acute distress and she exhibits normal effort.   Lab Findings: Lab Results  Component Value Date   WBC 14.1 (H) 12/01/2017   HGB 10.3 (L) 12/01/2017   HCT 33.3 (L) 12/01/2017   MCV 89.5 12/01/2017   PLT 465 (H) 12/01/2017     Radiographic Findings: No results found.  Impression/Plan: 1.  52 y.o.woman with metastatic non-small cell lung cancer, stage IV (cT3, cN1, cM1b). She appears to have recovered well from the effects  of her recent radiotherapy.  She continues to tolerate her systemic chemotherapy relatively well under the care and direction of Dr. Burr Medico.  Her next scheduled follow-up visit in medical oncology is 12/22/2017 in anticipation of beginning cycle 3 of her treatment. Per Dr. Ernestina Penna most recent follow-up visit note, the plan is to repeat her systemic imaging after completion of cycle 3.  We discussed that while we are happy to continue to participate in her care if clinically necessary, at this point, we will plan to see her back on an as-needed basis.  She will continue in routine follow-up as planned with Dr. Burr Medico but knows to call us at anytime with any questions or concerns related to her previous radiotherapy.                        Nicholos Johns, PA-C

## 2017-12-20 NOTE — Addendum Note (Signed)
Encounter addended by: Malena Edman, RN on: 12/20/2017 3:28 PM  Actions taken: Charge Capture section accepted

## 2017-12-21 ENCOUNTER — Other Ambulatory Visit: Payer: Self-pay | Admitting: Nurse Practitioner

## 2017-12-21 ENCOUNTER — Other Ambulatory Visit: Payer: Self-pay | Admitting: Hematology

## 2017-12-21 DIAGNOSIS — C3490 Malignant neoplasm of unspecified part of unspecified bronchus or lung: Secondary | ICD-10-CM

## 2017-12-21 NOTE — Progress Notes (Signed)
[place]  [phone] [phone]  Clinic Follow up Note   Patient Care Team: Redmond School, MD as PCP - General (Internal Medicine) 12/22/2017  SUMMARY OF ONCOLOGIC HISTORY: Oncology History   Cancer Staging Metastatic non-small cell lung cancer [facility]) Staging form: Lung, AJCC 8th Edition - Clinical stage from 10/17/2017: Stage IV (cT3, cN1, cM1b) - Signed by Truitt Merle, MD on 10/17/2017       Metastatic non-small cell lung cancer ([place])   09/20/2017 Imaging    US Breast Left 09/20/17  IMPRESSION Indeterminte palpable mass measuring 3.2x1.7x2.8 cm  inferior medial to the inframammary fold of the left breast.      09/20/2017 Initial Biopsy    Diagnosis 09/20/17 Soft Tissue Needle Core Biopsy, inferior medial to IMF - ADENOCARCINOMA. - SEE MICROSCOPIC DESCRIPTION.  Microscopic Comment Immunohistochemistry will be performed and reported as an addendum. (JDP:ah 09/23/17) ADDENDUM: Immunohistochemistry shows the tumor is strongly positive with cytokeratin AE1/AE3, cytokeratin 7 and shows moderate weak positivity with CDX-2. The tumor is negative with estrogen receptor, progesterone receptor, GCDFP, GATA-3, Napsin A, TTF-1, WT-1 and cytokeratin 20. The immunophenotype is consistent with metastatic carcinoma. The combination of CDX-2 and cytokeratin 7 positivity raises the possibility of an upper gastrointestinal primary. Clinical correlation is essential.    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 Silverio Decamp 10/10/17  IMPRESSION - Normal esophagus. - Z-line regular, 35 cm from the incisors. - Gastric bypass with a normal-sized pouch and intact staple line. Gastrojejunal anastomosis characterized by healthy appearing mucosa. - No specimens collected.    10/12/2017 Imaging    CT CAP WO Contrast 10/12/17  IMPRESSION: 7 cm central  right lung mass involving the right hilum, with postobstructive collapse of the right middle lobe, highly suspicious for primary bronchogenic carcinoma. 5 cm masslike opacity in the superior segment of the right lower lobe may represent carcinoma or postobstructive pneumonitis. 3.3 cm soft tissue mass in the lower anterior chest wall soft tissues, suspicious for metastatic disease. No evidence of abdominal or pelvic metastatic disease.     10/14/2017 Imaging    MRI Brain 10/14/17  IMPRESSION: 1. No metastatic disease or acute intracranial abnormality. Normal MRI appearance of the brain. 2. 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      10/17/2017 Cancer Staging    Staging form: Lung, AJCC 8th Edition - Clinical stage from 10/17/2017: Stage IV (cT3, cN1, cM1b) - Signed by Truitt Merle, MD on 10/17/2017    10/24/2017 - 11/04/2017 Radiation Therapy     The Right lung mass was treated to 30 Gy in 10 fractions of 3 Gy    11/08/2017 -  Chemotherapy     first line chemo with carboplatin, paclitaxel, Atezolizumab and bevacizumab every 3   CURRENT THERAPY: First line chemo with carboplatin, paclitaxel, Atezolizumab and bevacizumab every 3 weeks  INTERVAL HISTORY: Yvonne Lang returns for follow up and cycle 3 carbo/taxol/avastin/tecentrqic as scheduled. She received cycle 2 on 9/12, tecentriq was held for chronic steroid use. The day after treatment she weaned herself to 10 mg daily which she took for 2 weeks. She discontinued prednisone altogether 3 days ago. Since last treatment she has increased fatigue but remains functional with ADLs, requires more effort. She has increased daily nausea without vomiting. Takes compazine and zofran 1-2 times daily. Still able to eat and drink. Had diarrhea once, otherwise normal BM.  She has bone pain with udenyca, worse on left side. Controlled with tramadol, she takes 2 tabs q6 hours. She feels her left chest nodule is smaller and less painful.  Continues to note intermittent dry cough and DOE, at baseline. Denies chest pain, wheezing, or hemoptysis. Headaches are improved. Denies neuropathy, bleeding, or leg edema.    MEDICAL HISTORY:  Past Medical History:  Diagnosis Date  . Anxiety   . Bronchitis   . COPD (chronic obstructive pulmonary disease) ([place])   . Depression   . GERD (gastroesophageal reflux disease)   . H/O gastric bypass    1999  . Irritable bowel syndrome   . Lung cancer [facility])     SURGICAL HISTORY: Past Surgical History:  Procedure Laterality Date  . BACK SURGERY    . CHOLECYSTECTOMY    . COLON SURGERY     for a kink in her colon and 12 inchs removed  . COLONOSCOPY N/A 12/13/2013   Procedure: COLONOSCOPY;  Surgeon: Rogene Houston, MD;  Location: [facility];  Service: Endoscopy;  Laterality: N/A;  200  . ESOPHAGOGASTRODUODENOSCOPY N/A 12/13/2013   Procedure: ESOPHAGOGASTRODUODENOSCOPY (EGD);  Surgeon: Rogene Houston, MD;  Location: [facility];  Service: Endoscopy;  Laterality: N/A;  . Gastric Bypas  2000  . IR IMAGING GUIDED PORT INSERTION  11/14/2017    I have reviewed the social history and family history with the patient and they are unchanged from previous note.  ALLERGIES:  is allergic to codeine.  MEDICATIONS:  Current Outpatient Medications  Medication Sig Dispense Refill  . albuterol (PROVENTIL HFA) 108 (90 Base) MCG/ACT inhaler Inhale 2 puffs into the lungs 2 (two) times daily.     Marland Kitchen albuterol (PROVENTIL) 4 MG tablet Take 4 mg by mouth 2 (two) times daily.     Marland Kitchen ALPRAZolam (XANAX) 0.25 MG tablet Take 1 tablet (0.25 mg total) by mouth at bedtime as needed for anxiety. 30 tablet 0  . aspirin 81 MG tablet Take 81 mg by mouth daily.    . butalbital-acetaminophen-caffeine (FIORICET, ESGIC) 50-325-40 MG tablet Take 1-2 tablets by mouth every 6 (six) hours as needed for headache. 40 tablet 1  . CALCIUM PO Take by mouth daily.    Marland Kitchen dicyclomine (BENTYL) 20 MG tablet Take 1 tablet (20 mg total) by  mouth every 6 (six) hours. 60 tablet 1  . DULoxetine (CYMBALTA) 60 MG capsule Take 60 mg by mouth daily.    Marland Kitchen gabapentin (NEURONTIN) 300 MG capsule TAKE 1 CAPSULE BY MOUTH 4 TIMES DAILY  4  . lidocaine-prilocaine (EMLA) cream Apply 1 application topically as needed. Apply to port one hour prior to port access/chemo 30 g 2  . mirtazapine (REMERON) 7.5 MG tablet Take 1 tablet (7.5 mg total) by mouth at bedtime. 30 tablet 1  . Multiple Vitamin (MULTIVITAMIN) tablet Take 1 tablet by mouth daily.    Marland Kitchen omeprazole (PRILOSEC) 20 MG capsule Take 1 capsule (20 mg total) by mouth 2 (two) times daily before a meal. 60 capsule 0  . ondansetron (ZOFRAN) 8 MG tablet TAKE 1 TABLET BY MOUTH EVERY 8 HOURS AS NEEDED FOR NAUSEA AND VOMITING 30 tablet 1  . potassium chloride SA (K-DUR,KLOR-CON) 20 MEQ tablet Take 1 tablet (20 mEq total) by mouth daily. 60 tablet 0  . predniSONE (DELTASONE) 10 MG tablet Take 4 tablets (40 mg total) by mouth daily with breakfast. 56 tablet 0  . prochlorperazine (COMPAZINE) 10 MG tablet Take 1 tablet (10 mg total) by mouth  every 6 (six) hours as needed for nausea or vomiting. 30 tablet 0  . RaNITidine HCl (ZANTAC PO) Take by mouth 3 (three) times daily.    Marland Kitchen rOPINIRole (REQUIP) 0.5 MG tablet TAKE 1 TABLET BY MOUTH AT BEDTIME FOR 30 DAYS  11  . sucralfate (CARAFATE) 1 g tablet TAKE 1 TABLET(1 GRAM) BY MOUTH FOUR TIMES DAILY 120 tablet 1  . traMADol (ULTRAM) 50 MG tablet Take 1 tablet (50 mg total) by mouth every 6 (six) hours as needed. 90 tablet 0  . TRELEGY ELLIPTA 100-62.5-25 MCG/INH AEPB INHALE 1 PUFF INTO THE LUNGS EVERY DAY  5  . UNABLE TO FIND Inhale into the lungs. Med Name: Trilogy as needed    . methocarbamol (ROBAXIN) 750 MG tablet Take 750 mg by mouth 4 (four) times daily.     No current facility-administered medications for this visit.    Facility-Administered Medications Ordered in Other Visits  Medication Dose Route Frequency Provider Last Rate Last Dose  . CARBOplatin  (PARAPLATIN) 680 mg in sodium chloride 0.9 % 250 mL chemo infusion  680 mg Intravenous Once Truitt Merle, MD      . heparin lock flush 100 unit/mL  500 Units Intracatheter Once PRN Truitt Merle, MD      . PACLitaxel (TAXOL) 270 mg in sodium chloride 0.9 % 250 mL chemo infusion (> 80mg /m2)  135 mg/m2 (Treatment Plan Recorded) Intravenous Once Truitt Merle, MD 98 mL/hr at 12/22/17 1358 270 mg at 12/22/17 1358  . sodium chloride flush (NS) 0.9 % injection 10 mL  10 mL Intracatheter PRN Truitt Merle, MD        PHYSICAL EXAMINATION: ECOG PERFORMANCE STATUS: 2 - Symptomatic, <50% confined to bed  Vitals:   12/22/17 0909  BP: 105/73  Pulse: 97  Resp: 18  Temp: 97.6 F (36.4 C)  SpO2: 100%   Filed Weights   12/22/17 0909  Weight: 187 lb 1.6 oz (84.9 kg)    GENERAL:alert, no distress and comfortable SKIN:  no rashes or significant lesions EYES: sclera clear OROPHARYNX:no thrush or ulcers  LYMPH:  no palpable cervical or supraclavicular lymphadenopathy LUNGS: clear to auscultation with normal breathing effort HEART: regular rate & rhythm, no lower extremity edema ABDOMEN:abdomen soft, non-tender and normal bowel sounds Musculoskeletal:no cyanosis of digits and no clubbing  NEURO: alert & oriented x 3 with fluent speech, no focal motor/sensory deficits PAC without erythema   LABORATORY DATA:  I have reviewed the data as listed CBC Latest Ref Rng & Units 12/22/2017 12/01/2017 11/18/2017  WBC 3.9 - 10.3 K/uL 8.2 14.1(H) 28.2(H)  Hemoglobin 11.6 - 15.9 g/dL 11.4(L) 10.3(L) 10.2(L)  Hematocrit 34.8 - 46.6 % 34.6(L) 33.3(L) 32.9(L)  Platelets 145 - 400 K/uL 495(H) 465(H) 352     CMP Latest Ref Rng & Units 12/22/2017 12/01/2017 11/18/2017  Glucose 70 - 99 mg/dL 79 103(H) 105(H)  BUN 6 - 20 mg/dL 4(L) 6 6  Creatinine 0.44 - 1.00 mg/dL 0.61 0.64 0.61  Sodium 135 - 145 mmol/L 140 138 140  Potassium 3.5 - 5.1 mmol/L 3.8 3.9 3.5  Chloride 98 - 111 mmol/L 101 101 102  CO2 22 - 32 mmol/L 32 30 28  Calcium  8.9 - 10.3 mg/dL 8.8(L) 8.4(L) 8.9  Total Protein 6.5 - 8.1 g/dL 6.8 6.3(L) 6.4(L)  Total Bilirubin 0.3 - 1.2 mg/dL 0.3 <0.2(L) <0.2(L)  Alkaline Phos 38 - 126 U/L 316(H) 113 191(H)  AST 15 - 41 U/L 155(H) 18 18  ALT 0 - 44 U/L  199(H) 18 36      RADIOGRAPHIC STUDIES: I have personally reviewed the radiological images as listed and agreed with the findings in the report. No results found.   ASSESSMENT & PLAN: Yvonne Lang is a 52 y.o. female with history of bipolar II and lumbosacral radiculitis.   1. 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  -Due to her worsening dyspnea, she completed a short course of palliative radiation to her lung mass. -She has started to systemic chemotherapy with carboplatin, paclitaxel, Atezolizumab and bevacizumab.  Bevacizumab was held for first cycle due to pending port placement.  Chemo dose was slightly decreased for cycle 1 due to her limited performance status. -She tolerated first cycle chemotherapy well, with main complaint of bone pain from Neulasta injection.  Mild nausea and fatigue were manageable. -Her prednisone has gradually decreased from 40 mg to 20 mg daily, to 10 mg daily on 12/01/17, her dyspnea has been stable. She discontinued prednisone altogether on 12/19/17. She has increased fatigue and nausea, but overall stable. I reviewed signs of steroid withdrawal, if she develops concerning signs I urged her to call [facility] and restart 5 mg daily.  -Due to her chronic steroid use, Atezo was held for cycle 2, will add back starting today from cycle 3. -she completed 2 cycles, she tolerates well except increased fatigue and frequent nausea without vomiting. She gets emend and aloxi with pre-meds. I encouraged her to increase zofran and compazine PRN and stagger doses, try to take either agent before meals to promote po intake. She agrees.  -Labs reviewed, she has developed transaminitis, normal bili; possibly related to taxol.  Otherwise adequate to proceed with carbo/taxol, tecentriq, and avastin. Will dose reduce taxol today to 135 mg/m2 -Plan to repeat CT chest abdomen after cycle 3, I ordered today.   2. New onset frontal headaches   -10/14/17 Brain MRI shows no metastatic disease but shows C3-C4 disc and endplate degeneration. Previously reviewed.  -Tramadol dose not effect her headaches but she is taking Fioricet with some relief. -she was seen by Dr. Mickeal Skinner  3. Anxiety and Depression  4. GERD - previously prescribed sucralfate and pantoprazole on 10/04/17  5. Chronic back pain  -On tramadol 50-100 mg q6hours  6. Low appetite, Nausea  -Continue mirtazapine and antiemetics  Plan: -Labs reviewed, ALT/AST increased, normal bili; OK to treat, will dose-reduce taxol to 135 mg/m2.  -Proceed with cycle 3 carbo/taxol/avastin/tecentriq -restaging CT CAP after this cycle, ordered today -Return for follow up, review of scans, and next cycle in 3 weeks  -Increase anti-emetics PRN, take before meals and stagger zofran and compazine doses  -Restart prednisone 5 mg daily if you experience severe fatigue, or signs of steroid withdrawal   Orders Placed This Encounter  Procedures  . CT Abdomen Pelvis W Contrast    Standing Status:   Future    Standing Expiration Date:   12/22/2018    Order Specific Question:   If indicated for the ordered procedure, I authorize the administration of contrast media per Radiology protocol    Answer:   Yes    Order Specific Question:   Is patient pregnant?    Answer:   No    Order Specific Question:   Preferred imaging location?    Answer:   [facility]    Order Specific Question:   Is Oral Contrast requested for this exam?    Answer:   Yes, Per Radiology protocol    Order Specific  Question:   Radiology Contrast Protocol - do NOT remove file path    Answer:   \\[email or web address]  . CT Chest W Contrast    Standing Status:   Future    Standing  Expiration Date:   12/22/2018    Order Specific Question:   If indicated for the ordered procedure, I authorize the administration of contrast media per Radiology protocol    Answer:   Yes    Order Specific Question:   Is patient pregnant?    Answer:   No    Order Specific Question:   Preferred imaging location?    Answer:   [facility]    Order Specific Question:   Radiology Contrast Protocol - do NOT remove file path    Answer:   \\[email or web address]   All questions were answered. The patient knows to call the clinic with any problems, questions or concerns. No barriers to learning was detected. I spent 20 minutes counseling the patient face to face. The total time spent in the appointment was 25 minutes and more than 50% was on counseling and review of test results     Alla Feeling, NP 12/22/17

## 2017-12-22 ENCOUNTER — Inpatient Hospital Stay: Payer: No Typology Code available for payment source

## 2017-12-22 ENCOUNTER — Inpatient Hospital Stay: Payer: No Typology Code available for payment source | Admitting: Nurse Practitioner

## 2017-12-22 ENCOUNTER — Encounter: Payer: Self-pay | Admitting: Hematology

## 2017-12-22 ENCOUNTER — Inpatient Hospital Stay: Payer: No Typology Code available for payment source | Attending: Hematology

## 2017-12-22 ENCOUNTER — Encounter: Payer: Self-pay | Admitting: Nurse Practitioner

## 2017-12-22 ENCOUNTER — Telehealth: Payer: Self-pay | Admitting: Hematology

## 2017-12-22 VITALS — BP 105/73 | HR 97 | Temp 97.6°F | Resp 18 | Ht 67.0 in | Wt 187.1 lb

## 2017-12-22 DIAGNOSIS — Z7952 Long term (current) use of systemic steroids: Secondary | ICD-10-CM

## 2017-12-22 DIAGNOSIS — K219 Gastro-esophageal reflux disease without esophagitis: Secondary | ICD-10-CM

## 2017-12-22 DIAGNOSIS — Z7982 Long term (current) use of aspirin: Secondary | ICD-10-CM

## 2017-12-22 DIAGNOSIS — Z809 Family history of malignant neoplasm, unspecified: Secondary | ICD-10-CM | POA: Insufficient documentation

## 2017-12-22 DIAGNOSIS — R110 Nausea: Secondary | ICD-10-CM

## 2017-12-22 DIAGNOSIS — R530 Neoplastic (malignant) related fatigue: Secondary | ICD-10-CM | POA: Diagnosis not present

## 2017-12-22 DIAGNOSIS — Z923 Personal history of irradiation: Secondary | ICD-10-CM

## 2017-12-22 DIAGNOSIS — R05 Cough: Secondary | ICD-10-CM

## 2017-12-22 DIAGNOSIS — Z9884 Bariatric surgery status: Secondary | ICD-10-CM

## 2017-12-22 DIAGNOSIS — F17210 Nicotine dependence, cigarettes, uncomplicated: Secondary | ICD-10-CM | POA: Insufficient documentation

## 2017-12-22 DIAGNOSIS — Z95828 Presence of other vascular implants and grafts: Secondary | ICD-10-CM

## 2017-12-22 DIAGNOSIS — G8929 Other chronic pain: Secondary | ICD-10-CM

## 2017-12-22 DIAGNOSIS — C3490 Malignant neoplasm of unspecified part of unspecified bronchus or lung: Secondary | ICD-10-CM

## 2017-12-22 DIAGNOSIS — Z5111 Encounter for antineoplastic chemotherapy: Secondary | ICD-10-CM | POA: Insufficient documentation

## 2017-12-22 DIAGNOSIS — M5417 Radiculopathy, lumbosacral region: Secondary | ICD-10-CM

## 2017-12-22 DIAGNOSIS — Z5189 Encounter for other specified aftercare: Secondary | ICD-10-CM | POA: Diagnosis not present

## 2017-12-22 DIAGNOSIS — R740 Nonspecific elevation of levels of transaminase and lactic acid dehydrogenase [LDH]: Secondary | ICD-10-CM | POA: Diagnosis not present

## 2017-12-22 DIAGNOSIS — C3431 Malignant neoplasm of lower lobe, right bronchus or lung: Secondary | ICD-10-CM

## 2017-12-22 DIAGNOSIS — F3181 Bipolar II disorder: Secondary | ICD-10-CM | POA: Diagnosis not present

## 2017-12-22 DIAGNOSIS — F419 Anxiety disorder, unspecified: Secondary | ICD-10-CM

## 2017-12-22 DIAGNOSIS — R51 Headache: Secondary | ICD-10-CM | POA: Diagnosis not present

## 2017-12-22 DIAGNOSIS — Z79899 Other long term (current) drug therapy: Secondary | ICD-10-CM | POA: Diagnosis not present

## 2017-12-22 DIAGNOSIS — C7889 Secondary malignant neoplasm of other digestive organs: Secondary | ICD-10-CM | POA: Insufficient documentation

## 2017-12-22 DIAGNOSIS — Z5112 Encounter for antineoplastic immunotherapy: Secondary | ICD-10-CM | POA: Insufficient documentation

## 2017-12-22 DIAGNOSIS — Z800 Family history of malignant neoplasm of digestive organs: Secondary | ICD-10-CM | POA: Insufficient documentation

## 2017-12-22 LAB — CMP (CANCER CENTER ONLY)
ALBUMIN: 3 g/dL — AB (ref 3.5–5.0)
ALT: 199 U/L — ABNORMAL HIGH (ref 0–44)
AST: 155 U/L — ABNORMAL HIGH (ref 15–41)
Alkaline Phosphatase: 316 U/L — ABNORMAL HIGH (ref 38–126)
Anion gap: 7 (ref 5–15)
BUN: 4 mg/dL — ABNORMAL LOW (ref 6–20)
CHLORIDE: 101 mmol/L (ref 98–111)
CO2: 32 mmol/L (ref 22–32)
Calcium: 8.8 mg/dL — ABNORMAL LOW (ref 8.9–10.3)
Creatinine: 0.61 mg/dL (ref 0.44–1.00)
GFR, Est AFR Am: >60 mL/min (ref >60)
GFR, Estimated: >60 mL/min (ref >60)
GLUCOSE: 79 mg/dL (ref 70–99)
Potassium: 3.8 mmol/L (ref 3.5–5.1)
SODIUM: 140 mmol/L (ref 135–145)
Total Bilirubin: 0.3 mg/dL (ref 0.3–1.2)
Total Protein: 6.8 g/dL (ref 6.5–8.1)

## 2017-12-22 LAB — CBC WITH DIFFERENTIAL (CANCER CENTER ONLY)
Basophils Absolute: 0.1 10*3/uL (ref 0.0–0.1)
Basophils Relative: 1 %
Eosinophils Absolute: 0.3 10*3/uL (ref 0.0–0.5)
Eosinophils Relative: 3 %
HEMATOCRIT: 34.6 % — AB (ref 34.8–46.6)
Hemoglobin: 11.4 g/dL — ABNORMAL LOW (ref 11.6–15.9)
LYMPHS ABS: 0.9 10*3/uL (ref 0.9–3.3)
LYMPHS PCT: 11 %
MCH: 28.7 pg (ref 25.1–34.0)
MCHC: 32.8 g/dL (ref 31.5–36.0)
MCV: 87.3 fL (ref 79.5–101.0)
MONO ABS: 1.1 10*3/uL — AB (ref 0.1–0.9)
MONOS PCT: 14 %
Neutro Abs: 5.8 10*3/uL (ref 1.5–6.5)
Neutrophils Relative %: 71 %
Platelet Count: 495 10*3/uL — ABNORMAL HIGH (ref 145–400)
RBC: 3.97 MIL/uL (ref 3.70–5.45)
RDW: 16.8 % — AB (ref 11.2–14.5)
WBC Count: 8.2 10*3/uL (ref 3.9–10.3)

## 2017-12-22 LAB — TOTAL PROTEIN, URINE DIPSTICK: Protein, ur: NEGATIVE mg/dL

## 2017-12-22 LAB — TSH: TSH: 0.61 u[IU]/mL (ref 0.308–3.960)

## 2017-12-22 MED ORDER — DIPHENHYDRAMINE HCL 50 MG/ML IJ SOLN
INTRAMUSCULAR | Status: AC
  Filled 2017-12-22: qty 1

## 2017-12-22 MED ORDER — SODIUM CHLORIDE 0.9 % IV SOLN
Freq: Once | INTRAVENOUS | Status: AC
  Administered 2017-12-22: 11:00:00 via INTRAVENOUS
  Filled 2017-12-22: qty 5

## 2017-12-22 MED ORDER — SODIUM CHLORIDE 0.9 % IV SOLN
1200.0000 mg | Freq: Once | INTRAVENOUS | Status: AC
  Administered 2017-12-22: 1200 mg via INTRAVENOUS
  Filled 2017-12-22: qty 20

## 2017-12-22 MED ORDER — PALONOSETRON HCL INJECTION 0.25 MG/5ML
0.2500 mg | Freq: Once | INTRAVENOUS | Status: AC
  Administered 2017-12-22: 0.25 mg via INTRAVENOUS

## 2017-12-22 MED ORDER — SODIUM CHLORIDE 0.9 % IV SOLN
Freq: Once | INTRAVENOUS | Status: AC
  Administered 2017-12-22: 10:00:00 via INTRAVENOUS
  Filled 2017-12-22: qty 250

## 2017-12-22 MED ORDER — SODIUM CHLORIDE 0.9 % IV SOLN
15.0000 mg/kg | Freq: Once | INTRAVENOUS | Status: AC
  Administered 2017-12-22: 1300 mg via INTRAVENOUS
  Filled 2017-12-22: qty 48

## 2017-12-22 MED ORDER — SODIUM CHLORIDE 0.9% FLUSH
10.0000 mL | INTRAVENOUS | Status: DC | PRN
  Administered 2017-12-22: 10 mL
  Filled 2017-12-22: qty 10

## 2017-12-22 MED ORDER — DIPHENHYDRAMINE HCL 50 MG/ML IJ SOLN
50.0000 mg | Freq: Once | INTRAMUSCULAR | Status: AC
  Administered 2017-12-22: 50 mg via INTRAVENOUS

## 2017-12-22 MED ORDER — HEPARIN SOD (PORK) LOCK FLUSH 100 UNIT/ML IV SOLN
500.0000 [IU] | Freq: Once | INTRAVENOUS | Status: AC | PRN
  Administered 2017-12-22: 500 [IU]
  Filled 2017-12-22: qty 5

## 2017-12-22 MED ORDER — SODIUM CHLORIDE 0.9 % IV SOLN
679.0000 mg | Freq: Once | INTRAVENOUS | Status: AC
  Administered 2017-12-22: 680 mg via INTRAVENOUS
  Filled 2017-12-22: qty 68

## 2017-12-22 MED ORDER — FAMOTIDINE IN NACL 20-0.9 MG/50ML-% IV SOLN
20.0000 mg | Freq: Once | INTRAVENOUS | Status: AC
  Administered 2017-12-22: 20 mg via INTRAVENOUS

## 2017-12-22 MED ORDER — ALPRAZOLAM 0.25 MG PO TABS: 0.2500 mg | ORAL_TABLET | Freq: Every evening | ORAL | 0 refills | Status: DC | PRN

## 2017-12-22 MED ORDER — FAMOTIDINE IN NACL 20-0.9 MG/50ML-% IV SOLN
INTRAVENOUS | Status: AC
  Filled 2017-12-22: qty 50

## 2017-12-22 MED ORDER — PALONOSETRON HCL INJECTION 0.25 MG/5ML
INTRAVENOUS | Status: AC
  Filled 2017-12-22: qty 5

## 2017-12-22 MED ORDER — SODIUM CHLORIDE 0.9 % IV SOLN
135.0000 mg/m2 | Freq: Once | INTRAVENOUS | Status: AC
  Administered 2017-12-22: 270 mg via INTRAVENOUS
  Filled 2017-12-22: qty 45

## 2017-12-22 NOTE — Addendum Note (Signed)
Addended by: Alla Feeling on: 12/22/2017 04:37 PM   Modules accepted: Orders

## 2017-12-22 NOTE — Patient Instructions (Signed)
[facility] Discharge Instructions for Patients Receiving Chemotherapy  Today you received the following chemotherapy agents: Avastin, Tecentriq, Taxol, and Carboplatin.   To help prevent nausea and vomiting after your treatment, we encourage you to take your nausea medication as directed.   If you develop nausea and vomiting that is not controlled by your nausea medication, call the clinic.   BELOW ARE SYMPTOMS THAT SHOULD BE REPORTED IMMEDIATELY:  *FEVER GREATER THAN 100.5 F  *CHILLS WITH OR WITHOUT FEVER  NAUSEA AND VOMITING THAT IS NOT CONTROLLED WITH YOUR NAUSEA MEDICATION  *UNUSUAL SHORTNESS OF BREATH  *UNUSUAL BRUISING OR BLEEDING  TENDERNESS IN MOUTH AND THROAT WITH OR WITHOUT PRESENCE OF ULCERS  *URINARY PROBLEMS  *BOWEL PROBLEMS  UNUSUAL RASH Items with * indicate a potential emergency and should be followed up as soon as possible.  Feel free to call the clinic should you have any questions or concerns. The clinic phone number is [phone].  Please show the [place] at check-in to the Emergency Department and triage nurse.

## 2017-12-22 NOTE — Progress Notes (Signed)
Pt is off Prednisone. Confirmed Taxol dose of 135mg /m2 with [facility].  Hardie Pulley, PharmD, BCPS, BCOP

## 2017-12-22 NOTE — Progress Notes (Signed)
Per Dr. Burr Medico Taxol to be reduced not Carboplatin.

## 2017-12-22 NOTE — Telephone Encounter (Signed)
Appts previously scheduled/ Appt for injection was changed avs/calendar printed per 10/3 los

## 2017-12-22 NOTE — Progress Notes (Signed)
Per Cira Rue, NP and Dr. Burr Medico okay to treat with AST and ALT levels, however will do a dose reduction with Carboplatin.

## 2017-12-23 ENCOUNTER — Other Ambulatory Visit: Payer: Self-pay | Admitting: Nurse Practitioner

## 2017-12-23 MED ORDER — MAGIC MOUTHWASH W/LIDOCAINE: 5.0000 mL | Freq: Three times a day (TID) | ORAL | 0 refills | Status: DC

## 2017-12-23 NOTE — Progress Notes (Signed)
Per Regan Rakers Magic Mouthwash called in to [facility] today 12/23/17

## 2017-12-24 ENCOUNTER — Ambulatory Visit: Payer: 59

## 2017-12-26 ENCOUNTER — Ambulatory Visit: Payer: 59

## 2017-12-26 ENCOUNTER — Inpatient Hospital Stay: Payer: No Typology Code available for payment source

## 2017-12-26 VITALS — BP 123/96 | HR 110 | Temp 98.3°F | Resp 18

## 2017-12-26 DIAGNOSIS — C3490 Malignant neoplasm of unspecified part of unspecified bronchus or lung: Secondary | ICD-10-CM

## 2017-12-26 DIAGNOSIS — Z5189 Encounter for other specified aftercare: Secondary | ICD-10-CM | POA: Diagnosis not present

## 2017-12-26 MED ORDER — PEGFILGRASTIM-CBQV 6 MG/0.6ML ~~LOC~~ SOSY
6.0000 mg | PREFILLED_SYRINGE | Freq: Once | SUBCUTANEOUS | Status: AC
  Administered 2017-12-26: 6 mg via SUBCUTANEOUS

## 2017-12-26 MED ORDER — PEGFILGRASTIM-CBQV 6 MG/0.6ML ~~LOC~~ SOSY
PREFILLED_SYRINGE | SUBCUTANEOUS | Status: AC
  Filled 2017-12-26: qty 0.6

## 2017-12-26 NOTE — Patient Instructions (Signed)
Pegfilgrastim injection What is this medicine? PEGFILGRASTIM (PEG fil gra stim) is a long-acting granulocyte colony-stimulating factor that stimulates the growth of neutrophils, a type of white blood cell important in the body's fight against infection. It is used to reduce the incidence of fever and infection in patients with certain types of cancer who are receiving chemotherapy that affects the bone marrow, and to increase survival after being exposed to high doses of radiation. This medicine may be used for other purposes; ask your health care provider or pharmacist if you have questions. COMMON BRAND NAME(S): Neulasta What should I tell my health care provider before I take this medicine? They need to know if you have any of these conditions: -kidney disease -latex allergy -ongoing radiation therapy -sickle cell disease -skin reactions to acrylic adhesives (On-Body Injector only) -an unusual or allergic reaction to pegfilgrastim, filgrastim, other medicines, foods, dyes, or preservatives -pregnant or trying to get pregnant -breast-feeding How should I use this medicine? This medicine is for injection under the skin. If you get this medicine at home, you will be taught how to prepare and give the pre-filled syringe or how to use the On-body Injector. Refer to the patient Instructions for Use for detailed instructions. Use exactly as directed. Tell your healthcare provider immediately if you suspect that the On-body Injector may not have performed as intended or if you suspect the use of the On-body Injector resulted in a missed or partial dose. It is important that you put your used needles and syringes in a special sharps container. Do not put them in a trash can. If you do not have a sharps container, call your pharmacist or healthcare provider to get one. Talk to your pediatrician regarding the use of this medicine in children. While this drug may be prescribed for selected conditions,  precautions do apply. Overdosage: If you think you have taken too much of this medicine contact a poison control center or emergency room at once. NOTE: This medicine is only for you. Do not share this medicine with others. What if I miss a dose? It is important not to miss your dose. Call your doctor or health care professional if you miss your dose. If you miss a dose due to an On-body Injector failure or leakage, a new dose should be administered as soon as possible using a single prefilled syringe for manual use. What may interact with this medicine? Interactions have not been studied. Give your health care provider a list of all the medicines, herbs, non-prescription drugs, or dietary supplements you use. Also tell them if you smoke, drink alcohol, or use illegal drugs. Some items may interact with your medicine. This list may not describe all possible interactions. Give your health care provider a list of all the medicines, herbs, non-prescription drugs, or dietary supplements you use. Also tell them if you smoke, drink alcohol, or use illegal drugs. Some items may interact with your medicine. What should I watch for while using this medicine? You may need blood work done while you are taking this medicine. If you are going to need a MRI, CT scan, or other procedure, tell your doctor that you are using this medicine (On-Body Injector only). What side effects may I notice from receiving this medicine? Side effects that you should report to your doctor or health care professional as soon as possible: -allergic reactions like skin rash, itching or hives, swelling of the face, lips, or tongue -dizziness -fever -pain, redness, or irritation at site   where injected -pinpoint red spots on the skin -red or dark-brown urine -shortness of breath or breathing problems -stomach or side pain, or pain at the shoulder -swelling -tiredness -trouble passing urine or change in the amount of urine Side  effects that usually do not require medical attention (report to your doctor or health care professional if they continue or are bothersome): -bone pain -muscle pain This list may not describe all possible side effects. Call your doctor for medical advice about side effects. You may report side effects to FDA at 1-800-FDA-1088. Where should I keep my medicine? Keep out of the reach of children. Store pre-filled syringes in a refrigerator between 2 and 8 degrees C (36 and 46 degrees F). Do not freeze. Keep in carton to protect from light. Throw away this medicine if it is left out of the refrigerator for more than 48 hours. Throw away any unused medicine after the expiration date. NOTE: This sheet is a summary. It may not cover all possible information. If you have questions about this medicine, talk to your doctor, pharmacist, or health care provider.  2018 Elsevier/Gold Standard (2016-03-04 12:58:03)  

## 2018-01-02 ENCOUNTER — Telehealth: Payer: Self-pay | Admitting: Hematology

## 2018-01-02 NOTE — Telephone Encounter (Signed)
Faxed medical record to [facility], Release ID: [id number]

## 2018-01-03 ENCOUNTER — Encounter: Payer: Self-pay | Admitting: Nurse Practitioner

## 2018-01-05 ENCOUNTER — Encounter: Payer: Self-pay | Admitting: Hematology

## 2018-01-05 ENCOUNTER — Other Ambulatory Visit: Payer: Self-pay | Admitting: Nurse Practitioner

## 2018-01-05 DIAGNOSIS — C3490 Malignant neoplasm of unspecified part of unspecified bronchus or lung: Secondary | ICD-10-CM

## 2018-01-05 MED ORDER — BUTALBITAL-APAP-CAFFEINE 50-325-40 MG PO TABS: 1.0000 | ORAL_TABLET | Freq: Four times a day (QID) | ORAL | 1 refills | Status: DC | PRN

## 2018-01-09 ENCOUNTER — Encounter: Payer: Self-pay | Admitting: Hematology

## 2018-01-10 ENCOUNTER — Encounter: Payer: Self-pay | Admitting: Hematology

## 2018-01-10 ENCOUNTER — Encounter (HOSPITAL_COMMUNITY): Payer: Self-pay

## 2018-01-10 ENCOUNTER — Ambulatory Visit (HOSPITAL_COMMUNITY)
Admission: RE | Admit: 2018-01-10 | Discharge: 2018-01-10 | Disposition: A | Discharge status: Home / Self Care | Payer: 59 | Source: Ambulatory Visit | Attending: Nurse Practitioner | Admitting: Nurse Practitioner

## 2018-01-10 DIAGNOSIS — R918 Other nonspecific abnormal finding of lung field: Secondary | ICD-10-CM | POA: Diagnosis not present

## 2018-01-10 DIAGNOSIS — I700 Atherosclerosis of aorta: Secondary | ICD-10-CM | POA: Diagnosis not present

## 2018-01-10 DIAGNOSIS — C3490 Malignant neoplasm of unspecified part of unspecified bronchus or lung: Secondary | ICD-10-CM | POA: Insufficient documentation

## 2018-01-10 DIAGNOSIS — J439 Emphysema, unspecified: Secondary | ICD-10-CM | POA: Diagnosis not present

## 2018-01-10 DIAGNOSIS — I2510 Atherosclerotic heart disease of native coronary artery without angina pectoris: Secondary | ICD-10-CM | POA: Diagnosis not present

## 2018-01-10 MED ORDER — HEPARIN SOD (PORK) LOCK FLUSH 100 UNIT/ML IV SOLN
500.0000 [IU] | Freq: Once | INTRAVENOUS | Status: AC
  Administered 2018-01-10: 500 [IU] via INTRAVENOUS

## 2018-01-10 MED ORDER — IOHEXOL 300 MG/ML  SOLN
100.0000 mL | Freq: Once | INTRAMUSCULAR | Status: AC | PRN
  Administered 2018-01-10: 100 mL via INTRAVENOUS

## 2018-01-10 MED ORDER — HEPARIN SOD (PORK) LOCK FLUSH 100 UNIT/ML IV SOLN
INTRAVENOUS | Status: AC
  Administered 2018-01-10: 500 [IU] via INTRAVENOUS
  Filled 2018-01-10: qty 5

## 2018-01-10 MED ORDER — SODIUM CHLORIDE 0.9 % IJ SOLN
INTRAMUSCULAR | Status: AC
  Filled 2018-01-10: qty 50

## 2018-01-10 NOTE — Progress Notes (Signed)
[place]  [phone] [phone]  Clinic Follow Up Note   Patient Care Team: Redmond School, MD as PCP - General (Internal Medicine)   Date of Service:  01/12/2018  CHIEF COMPLAINTS:  F/u for metastatic lung adenocarcinoma  Oncology History   Cancer Staging Metastatic non-small cell lung cancer [facility]) Staging form: Lung, AJCC 8th Edition - Clinical stage from 10/17/2017: Stage IV (cT3, cN1, cM1b) - Signed by Truitt Merle, MD on 10/17/2017       Metastatic non-small cell lung cancer ([place])   09/20/2017 Imaging    US Breast Left 09/20/17  IMPRESSION Indeterminte palpable mass measuring 3.2x1.7x2.8 cm  inferior medial to the inframammary fold of the left breast.      09/20/2017 Initial Biopsy    Diagnosis 09/20/17 Soft Tissue Needle Core Biopsy, inferior medial to IMF - ADENOCARCINOMA. - SEE MICROSCOPIC DESCRIPTION.  Microscopic Comment Immunohistochemistry will be performed and reported as an addendum. (JDP:ah 09/23/17) ADDENDUM: Immunohistochemistry shows the tumor is strongly positive with cytokeratin AE1/AE3, cytokeratin 7 and shows moderate weak positivity with CDX-2. The tumor is negative with estrogen receptor, progesterone receptor, GCDFP, GATA-3, Napsin A, TTF-1, WT-1 and cytokeratin 20. The immunophenotype is consistent with metastatic carcinoma. The combination of CDX-2 and cytokeratin 7 positivity raises the possibility of an upper gastrointestinal primary. Clinical correlation is essential.    10/06/2017 Initial Diagnosis    Metastatic adenocarcinoma involving soft tissue with unknown primary site [facility])    10/10/2017 Procedure    Upper Endoscopy by Dr. Silverio Decamp 10/10/17  IMPRESSION - Normal esophagus. - Z-line regular, 35 cm from the incisors. - Gastric bypass with a normal-sized pouch and intact staple line. Gastrojejunal anastomosis characterized by healthy appearing mucosa. - No specimens collected.    10/12/2017 Imaging    CT  CAP WO Contrast 10/12/17  IMPRESSION: 7 cm central right lung mass involving the right hilum, with postobstructive collapse of the right middle lobe, highly suspicious for primary bronchogenic carcinoma. 5 cm masslike opacity in the superior segment of the right lower lobe may represent carcinoma or postobstructive pneumonitis. 3.3 cm soft tissue mass in the lower anterior chest wall soft tissues, suspicious for metastatic disease. No evidence of abdominal or pelvic metastatic disease.     10/14/2017 Imaging    MRI Brain 10/14/17  IMPRESSION: 1. No metastatic disease or acute intracranial abnormality. Normal MRI appearance of the brain. 2. 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      10/17/2017 Cancer Staging    Staging form: Lung, AJCC 8th Edition - Clinical stage from 10/17/2017: Stage IV (cT3, cN1, cM1b) - Signed by Truitt Merle, MD on 10/17/2017    10/24/2017 - 11/04/2017 Radiation Therapy     The Right lung mass was treated to 30 Gy in 10 fractions of 3 Gy    11/08/2017 -  Chemotherapy     first line chemo with carboplatin, paclitaxel, Atezolizumab and bevacizumab every 3    01/10/2018 Imaging    01/10/2018 CT CA IMPRESSION: 1. Response to therapy of central right lower lobe lung mass. Re-expansion of the right middle lobe with significantly improved right lower lobe postobstructive pneumonitis. Residual geographic airspace and ground-glass opacity within the medial right lung, primarily favored to be radiation induced. 2. Near complete resolution of presternal soft tissue mass. 3. New right adrenal nodule since 10/11/2017, suspicious for metastatic disease. 4. Right-sided Port-A-Cath with probable nonocclusive thrombus along the catheter within the right brachiocephalic vein. 5. Age advanced coronary artery atherosclerosis. Recommend assessment of  coronary risk factors and consideration of medical therapy. 6. Aortic atherosclerosis (ICD10-I70.0) and emphysema  (ICD10-J43.9).     HISTORY OF PRESENTING ILLNESS (10/04/2017):  Yvonne Lang 52 y.o. female who was referred to me by her PCP Dr. Gerarda Fraction. She is here today for newly diagnosed metastatic adenocarcinoma.  She is accompanied by her sister and her mother to my clinic today.    She initially presented with a palpable mass inferior medical to the inframammary fold of the left breast about 3 months ago,  which was recently biopsied and revealed metastatic adenocarcinoma, favor upper GI origin.  She noticed the breast knot 3 months ago and it was a size of a nickel initially, and it has increased in size over the past 3 months.  She was seen by a primary care physician who referred her mammogram, which reviewed no mass in the breasts, and biopsy of the chest wall mass was performed.  She has a gastric bypass in early 2000s and is having GERD that are increasing in severity now. She uses sucralfate and omeprazole more frequently. It is associated with sharp knife-like epigastric pain that is getting worse and more frequent now and lasts 10 minutes and comes at least once a week. She sometimes gets dysphagia, but it's neither constant nor significant. No other abdominal pain. She also has new onset headaches in the frontal area that is constant and are 6-7/10 for the past 2 to 3 weeks.  She never had these headaches before and she uses Tylenol that provides minimal relief.    For pertinent past medical history, she also had 10-12 inches removed from her colon. No melena or hematochezia. No hematuria. No changes in bowel habits. She's been having back pains that are getting worse. She takes Tylenol for it and it helps. She reports history of back surgery. She was also diagnosed with COPD, depression, and anxiety.   She was on 3 months of progesterone and estriol for hot flashes and stopped  When she found about the new breast mass 3 weeks ago.   Her sister died of skin cancer. Her uncle had colon cancer at  age 32.  She lives with her mother. No children. No alcohol. She chews a nicotine gum as she's trying to quit smoking. She's been smoking since age 77.   CURRENT THERAPY: First line chemo with carboplatin, paclitaxel, Atezolizumab and bevacizumab every 3 weeks, started on 11/08/2017   INTERVAL HISTORY  Yvonne Lang is here for a follow up. She saw NP Lacie in the interim and was noted to had discontinued prednisone on 12/19/2017.  Today, she is here with her family member. She is doing well, and has noticed that her chest mass has shrunk in size. She complains of a dry cough at night. She takes Xanax at night and she tries to elevate her head for relief.     MEDICAL HISTORY:  Past Medical History:  Diagnosis Date  . Anxiety   . Bronchitis   . COPD (chronic obstructive pulmonary disease) ([place])   . Depression   . GERD (gastroesophageal reflux disease)   . H/O gastric bypass    1999  . Irritable bowel syndrome   . Lung cancer [facility])     SURGICAL HISTORY: Past Surgical History:  Procedure Laterality Date  . BACK SURGERY    . CHOLECYSTECTOMY    . COLON SURGERY     for a kink in her colon and 12 inchs removed  . COLONOSCOPY N/A 12/13/2013  Procedure: COLONOSCOPY;  Surgeon: Rogene Houston, MD;  Location: [facility];  Service: Endoscopy;  Laterality: N/A;  200  . ESOPHAGOGASTRODUODENOSCOPY N/A 12/13/2013   Procedure: ESOPHAGOGASTRODUODENOSCOPY (EGD);  Surgeon: Rogene Houston, MD;  Location: [facility] END[facility];  Service: Endoscopy;  Laterality: N/A;  . Gastric Bypas  2000  . IR IMAGING GUIDED PORT INSERTION  11/14/2017    SOCIAL HISTORY: Social History   Socioeconomic History  . Marital status: Single    Spouse name: Not on file  . Number of children: Not on file  . Years of education: Not on file  . Highest education level: Not on file  Occupational History  . Not on file  Social Needs  . Financial resource strain: Not on file  . Food insecurity:    Worry: Not on file     Inability: Not on file  . Transportation needs:    Medical: Not on file    Non-medical: Not on file  Tobacco Use  . Smoking status: Current Every Day Smoker    Packs/day: 1.00    Years: 40.00    Pack years: 40.00    Types: Cigarettes  . Smokeless tobacco: Never Used  . Tobacco comment: Pack a day x 30 yrs. Trying to stop smoking using nicotine gum.   Substance and Sexual Activity  . Alcohol use: No  . Drug use: No  . Sexual activity: Not Currently    Birth control/protection: Post-menopausal  Lifestyle  . Physical activity:    Days per week: Not on file    Minutes per session: Not on file  . Stress: Not on file  Relationships  . Social connections:    Talks on phone: Not on file    Gets together: Not on file    Attends religious service: Not on file    Active member of club or organization: Not on file    Attends meetings of clubs or organizations: Not on file    Relationship status: Not on file  . Intimate partner violence:    Fear of current or ex partner: Not on file    Emotionally abused: Not on file    Physically abused: Not on file    Forced sexual activity: Not on file  Other Topics Concern  . Not on file  Social History Narrative  . Not on file    FAMILY HISTORY: Family History  Problem Relation Age of Onset  . Other Mother        has a pacemaker; rectal prolapse; rods in legs; lung fibrosis   . Other Father        heart gave out  . Hypertension Sister   . Bronchitis Sister   . Heart attack Paternal Grandmother   . COPD Maternal Grandmother   . Emphysema Maternal Grandmother   . Alcoholism Maternal Grandfather   . Cancer Sister        rare skin cancer  . Cancer Maternal Uncle 7       colon cancer   . Colon cancer Maternal Uncle     ALLERGIES:  is allergic to codeine.  MEDICATIONS:  Current Outpatient Medications  Medication Sig Dispense Refill  . albuterol (PROVENTIL) 4 MG tablet Take 4 mg by mouth 2 (two) times daily.     Marland Kitchen ALPRAZolam  (XANAX) 0.25 MG tablet Take 1 tablet (0.25 mg total) by mouth at bedtime as needed for anxiety. 30 tablet 0  . aspirin 81 MG tablet Take 81 mg by mouth daily.    Marland Kitchen  butalbital-acetaminophen-caffeine (FIORICET, ESGIC) 50-325-40 MG tablet Take 1-2 tablets by mouth every 6 (six) hours as needed for headache. 40 tablet 1  . CALCIUM PO Take by mouth daily.    Marland Kitchen dicyclomine (BENTYL) 20 MG tablet Take 1 tablet (20 mg total) by mouth every 6 (six) hours. 60 tablet 1  . DULoxetine (CYMBALTA) 60 MG capsule Take 60 mg by mouth daily.    Marland Kitchen gabapentin (NEURONTIN) 300 MG capsule TAKE 1 CAPSULE BY MOUTH 4 TIMES DAILY  4  . lidocaine-prilocaine (EMLA) cream Apply 1 application topically as needed. Apply to port one hour prior to port access/chemo 30 g 2  . magic mouthwash w/lidocaine SOLN Take 5 mLs by mouth 3 (three) times daily. Swish and spit 3 x daily 240 mL 0  . methocarbamol (ROBAXIN) 750 MG tablet Take 750 mg by mouth 4 (four) times daily.    . mirtazapine (REMERON) 7.5 MG tablet Take 1 tablet (7.5 mg total) by mouth at bedtime. 30 tablet 1  . Multiple Vitamin (MULTIVITAMIN) tablet Take 1 tablet by mouth daily.    Marland Kitchen omeprazole (PRILOSEC) 20 MG capsule Take 1 capsule (20 mg total) by mouth 2 (two) times daily before a meal. 60 capsule 0  . ondansetron (ZOFRAN) 8 MG tablet TAKE 1 TABLET BY MOUTH EVERY 8 HOURS AS NEEDED FOR NAUSEA AND VOMITING 30 tablet 1  . potassium chloride SA (K-DUR,KLOR-CON) 20 MEQ tablet Take 1 tablet (20 mEq total) by mouth daily. 60 tablet 0  . predniSONE (DELTASONE) 10 MG tablet Take 4 tablets (40 mg total) by mouth daily with breakfast. 56 tablet 0  . prochlorperazine (COMPAZINE) 10 MG tablet Take 1 tablet (10 mg total) by mouth every 6 (six) hours as needed for nausea or vomiting. 30 tablet 0  . RaNITidine HCl (ZANTAC PO) Take by mouth 3 (three) times daily.    Marland Kitchen rOPINIRole (REQUIP) 0.5 MG tablet TAKE 1 TABLET BY MOUTH AT BEDTIME FOR 30 DAYS  11  . sucralfate (CARAFATE) 1 g tablet  TAKE 1 TABLET(1 GRAM) BY MOUTH FOUR TIMES DAILY 120 tablet 1  . traMADol (ULTRAM) 50 MG tablet Take 1 tablet (50 mg total) by mouth every 6 (six) hours as needed. 90 tablet 0  . TRELEGY ELLIPTA 100-62.5-25 MCG/INH AEPB INHALE 1 PUFF INTO THE LUNGS EVERY DAY  5  . UNABLE TO FIND Inhale into the lungs. Med Name: Trilogy as needed    . albuterol (PROVENTIL HFA) 108 (90 Base) MCG/ACT inhaler Inhale 2 puffs into the lungs 2 (two) times daily.      No current facility-administered medications for this visit.    Facility-Administered Medications Ordered in Other Visits  Medication Dose Route Frequency Provider Last Rate Last Dose  . CARBOplatin (PARAPLATIN) 680 mg in sodium chloride 0.9 % 250 mL chemo infusion  680 mg Intravenous Once Truitt Merle, MD      . heparin lock flush 100 unit/mL  500 Units Intracatheter Once PRN Truitt Merle, MD      . sodium chloride flush (NS) 0.9 % injection 10 mL  10 mL Intracatheter PRN Truitt Merle, MD        REVIEW OF SYSTEMS:  Constitutional: Denies fevers, chills or abnormal night sweats  Eyes: Denies blurriness of vision, double vision or watery eyes Ears, nose, mouth, throat, and face: Denies mucositis or sore throat Respiratory: Denies dyspnea or wheezing (+) dry cough at night (+) chest wall nodule, smaller in size Cardiovascular: Denies palpitation, chest discomfort or lower extremity swelling Gastrointestinal:  Denies heartburn or change in bowel habits (+) history of gastric bypass  Skin: Denies abnormal skin rashes Lymphatics: Denies new lymphadenopathy or easy bruising Neurological:Denies numbness, tingling or new weaknesses Behavioral/Psych: Mood is stable, no new changes  MSK: No joint pain or myalgia  All other systems were reviewed with the patient and are negative.  PHYSICAL EXAMINATION: ECOG PERFORMANCE STATUS: 2  Vitals:   01/12/18 0901  BP: 115/89  Pulse: 92  Resp: 18  Temp: 97.7 F (36.5 C)  SpO2: 100%   Filed Weights   01/12/18 0901    Weight: 189 lb 12.8 oz (86.1 kg)    GENERAL:alert, no distress and comfortable (+) hair loss SKIN: skin color, texture, turgor are normal, no rashes or significant lesions EYES: normal, conjunctiva are pink and non-injected, sclera clear OROPHARYNX:no exudate, no erythema and lips, buccal mucosa, and tongue normal  NECK: supple, thyroid normal size, non-tender, without nodularity LYMPH:  no palpable lymphadenopathy in the cervical, axillary or inguinal LUNGS: clear to percussion. (+) Auscultation revealed bilateral wheezing  HEART: regular rate & rhythm and no murmurs and no lower extremity edema. The subcutaneous nodule below the sternum has resolved now. ABDOMEN:abdomen soft, non-tender and normal bowel sounds Musculoskeletal:no cyanosis of digits and no clubbing  PSYCH: alert & oriented x 3 with fluent speech NEURO: no focal motor/sensory deficits   LABORATORY DATA:  I have reviewed the data as listed CBC Latest Ref Rng & Units 01/12/2018 12/22/2017 12/01/2017  WBC 4.0 - 10.5 K/uL 7.1 8.2 14.1(H)  Hemoglobin 12.0 - 15.0 g/dL 11.0(L) 11.4(L) 10.3(L)  Hematocrit 36.0 - 46.0 % 34.4(L) 34.6(L) 33.3(L)  Platelets 150 - 400 K/uL 366 495(H) 465(H)   CMP Latest Ref Rng & Units 01/12/2018 12/22/2017 12/01/2017  Glucose 70 - 99 mg/dL 128(H) 79 103(H)  BUN 6 - 20 mg/dL 4(L) 4(L) 6  Creatinine 0.44 - 1.00 mg/dL 0.66 0.61 0.64  Sodium 135 - 145 mmol/L 139 140 138  Potassium 3.5 - 5.1 mmol/L 4.2 3.8 3.9  Chloride 98 - 111 mmol/L 102 101 101  CO2 22 - 32 mmol/L 27 32 30  Calcium 8.9 - 10.3 mg/dL 8.5(L) 8.8(L) 8.4(L)  Total Protein 6.5 - 8.1 g/dL 6.3(L) 6.8 6.3(L)  Total Bilirubin 0.3 - 1.2 mg/dL <0.2(L) 0.3 <0.2(L)  Alkaline Phos 38 - 126 U/L 148(H) 316(H) 113  AST 15 - 41 U/L 17 155(H) 18  ALT 0 - 44 U/L 13 199(H) 18   Tumor Markers CEA (0-5ng/ml)  10/04/2017: 38    Pathology  09/20/2017 Breast Biopsy Diagnosis Soft Tissue Needle Core Biopsy, inferior medial to IMF -  ADENOCARCINOMA. - SEE MICROSCOPIC DESCRIPTION. Microscopic Comment Immunohistochemistry will be performed and reported as an addendum. (JDP:ah 09/23/17) ADDENDUM: Immunohistochemistry shows the tumor is strongly positive with cytokeratin AE1/AE3, cytokeratin 7 and shows moderate weak positivity with CDX-2. The tumor is negative with estrogen receptor, progesterone receptor, GCDFP, GATA-3, Napsin A, TTF-1, WT-1 and cytokeratin 20. The immunophenotype is consistent with metastatic carcinoma. The combination of CDX-2 and cytokeratin 7 positivity raises the possibility of an upper gastrointestinal primary. Clinical correlation is essential.ical Information Specimen(s) Obtained: Soft Tissue Needle Core Biopsy, inferior medial to IMF Specimen Clinical Information not definitely in the breast palpable mass x 1 month; ? lymph node r/o malignancy Gross Received in two containers (one with formalin, one with saline) are three cores of gray-white to dark soft firm tissue (one core in saline, two in formalin) which range from 1 x 0.1 cm to 1.3 x 0.1 cm.  The core from saline is submitted in RPMI for possible flow cytometry, and two cores from formalin are submitted in one block for routine histology.     PROCEDURES  Upper Endoscopy by Dr. Silverio Decamp 10/10/17  IMPRESSION - Normal esophagus. - Z-line regular, 35 cm from the incisors. - Gastric bypass with a normal-sized pouch and intact staple line. Gastrojejunal anastomosis characterized by healthy appearing mucosa. - No specimens collected.   RADIOGRAPHIC STUDIES: I have personally reviewed the radiological images as listed and agreed with the findings in the report.  01/10/2018 CT CA IMPRESSION: 1. Response to therapy of central right lower lobe lung mass. Re-expansion of the right middle lobe with significantly improved right lower lobe postobstructive pneumonitis. Residual geographic airspace and ground-glass opacity within the medial right  lung, primarily favored to be radiation induced. 2. Near complete resolution of presternal soft tissue mass. 3. New right adrenal nodule since 10/11/2017, suspicious for metastatic disease. 4. Right-sided Port-A-Cath with probable nonocclusive thrombus along the catheter within the right brachiocephalic vein. 5. Age advanced coronary artery atherosclerosis. Recommend assessment of coronary risk factors and consideration of medical therapy. 6. Aortic atherosclerosis (ICD10-I70.0) and emphysema (ICD10-J43.9).  Ct Chest W Contrast  Result Date: 01/10/2018 CLINICAL DATA:  Non-small-cell lung cancer diagnosed in July. Chemotherapy and radiation therapy in progress. Restaging. EXAM: CT CHEST AND ABDOMEN WITH CONTRAST TECHNIQUE: Multidetector CT imaging of the chest and abdomen was performed following the standard protocol during bolus administration of intravenous contrast. CONTRAST:  155mL OMNIPAQUE IOHEXOL 300 MG/ML  SOLN COMPARISON:  10/11/2017 FINDINGS: CT CHEST FINDINGS Cardiovascular: A right-sided Port-A-Cath terminates at the mid right atrium. There is a filling defect suspicious for thrombus within the right brachiocephalic vein on coronal image 50 and transverse image 17/2. Normal heart size, without pericardial effusion. Lad coronary artery calcification. No central pulmonary embolism, on this non-dedicated study. Mediastinum/Nodes: No supraclavicular adenopathy. No mediastinal adenopathy. No left hilar adenopathy. Lungs/Pleura: No pleural fluid. Persistent but decreased mass effect upon the right lower lobe endobronchial tree. The right middle lobe endobronchial tree is newly aerated, with re-expansion of the right middle lobe. Mild to moderate centrilobular emphysema. Patchy geographic right-sided ground-glass and airspace opacities medially are likely radiation induced. The central superior segment right lower lobe lung mass measures 3.7 x 3.0 cm on image 60/6. Compare 6.8 x 6.6 cm on the  prior. The masslike right lower lobe opacity more posteriorly and inferiorly has resolved with only mild airspace and ground-glass opacity remaining in this area. Musculoskeletal: No acute osseous abnormality. The presternal soft tissue density mass has nearly completely resolved. Subtle soft tissue thickening in the subcutaneous fat at 1.6 x 0.9 cm on image 42/2. Compare 3.3 x 2.4 cm on the prior. CT ABDOMEN FINDINGS Hepatobiliary: Nonspecific caudate lobe enlargement. Cholecystectomy, without biliary ductal dilatation. Pancreas: Normal, without mass or ductal dilatation. Spleen: Normal in size, without focal abnormality. Adrenals/Urinary Tract: Normal left adrenal gland. Right adrenal nodule of 9 mm on image 61/2, new since the prior. Normal kidneys, without hydronephrosis. Stomach/Bowel: Status post gastrojejunostomy. Otherwise normal large and small bowel loops. Vascular/Lymphatic: Aortic and branch vessel atherosclerosis. No retroperitoneal or retrocrural adenopathy. Other: No ascites.  Mild ventral abdominal wall laxity. Musculoskeletal: Incompletely imaged lumbar spine fixation. S-shaped thoracolumbar spine curvature. IMPRESSION: 1. Response to therapy of central right lower lobe lung mass. Re-expansion of the right middle lobe with significantly improved right lower lobe postobstructive pneumonitis. Residual geographic airspace and ground-glass opacity within the medial right lung, primarily favored to be radiation induced.  2. Near complete resolution of presternal soft tissue mass. 3. New right adrenal nodule since 10/11/2017, suspicious for metastatic disease. 4. Right-sided Port-A-Cath with probable nonocclusive thrombus along the catheter within the right brachiocephalic vein. 5. Age advanced coronary artery atherosclerosis. Recommend assessment of coronary risk factors and consideration of medical therapy. 6. Aortic atherosclerosis (ICD10-I70.0) and emphysema (ICD10-J43.9). Electronically Signed   By:  Abigail Miyamoto M.D.   On: 01/10/2018 10:58   Ct Abdomen W Contrast  Result Date: 01/10/2018 CLINICAL DATA:  Non-small-cell lung cancer diagnosed in July. Chemotherapy and radiation therapy in progress. Restaging. EXAM: CT CHEST AND ABDOMEN WITH CONTRAST TECHNIQUE: Multidetector CT imaging of the chest and abdomen was performed following the standard protocol during bolus administration of intravenous contrast. CONTRAST:  164mL OMNIPAQUE IOHEXOL 300 MG/ML  SOLN COMPARISON:  10/11/2017 FINDINGS: CT CHEST FINDINGS Cardiovascular: A right-sided Port-A-Cath terminates at the mid right atrium. There is a filling defect suspicious for thrombus within the right brachiocephalic vein on coronal image 50 and transverse image 17/2. Normal heart size, without pericardial effusion. Lad coronary artery calcification. No central pulmonary embolism, on this non-dedicated study. Mediastinum/Nodes: No supraclavicular adenopathy. No mediastinal adenopathy. No left hilar adenopathy. Lungs/Pleura: No pleural fluid. Persistent but decreased mass effect upon the right lower lobe endobronchial tree. The right middle lobe endobronchial tree is newly aerated, with re-expansion of the right middle lobe. Mild to moderate centrilobular emphysema. Patchy geographic right-sided ground-glass and airspace opacities medially are likely radiation induced. The central superior segment right lower lobe lung mass measures 3.7 x 3.0 cm on image 60/6. Compare 6.8 x 6.6 cm on the prior. The masslike right lower lobe opacity more posteriorly and inferiorly has resolved with only mild airspace and ground-glass opacity remaining in this area. Musculoskeletal: No acute osseous abnormality. The presternal soft tissue density mass has nearly completely resolved. Subtle soft tissue thickening in the subcutaneous fat at 1.6 x 0.9 cm on image 42/2. Compare 3.3 x 2.4 cm on the prior. CT ABDOMEN FINDINGS Hepatobiliary: Nonspecific caudate lobe enlargement.  Cholecystectomy, without biliary ductal dilatation. Pancreas: Normal, without mass or ductal dilatation. Spleen: Normal in size, without focal abnormality. Adrenals/Urinary Tract: Normal left adrenal gland. Right adrenal nodule of 9 mm on image 61/2, new since the prior. Normal kidneys, without hydronephrosis. Stomach/Bowel: Status post gastrojejunostomy. Otherwise normal large and small bowel loops. Vascular/Lymphatic: Aortic and branch vessel atherosclerosis. No retroperitoneal or retrocrural adenopathy. Other: No ascites.  Mild ventral abdominal wall laxity. Musculoskeletal: Incompletely imaged lumbar spine fixation. S-shaped thoracolumbar spine curvature. IMPRESSION: 1. Response to therapy of central right lower lobe lung mass. Re-expansion of the right middle lobe with significantly improved right lower lobe postobstructive pneumonitis. Residual geographic airspace and ground-glass opacity within the medial right lung, primarily favored to be radiation induced. 2. Near complete resolution of presternal soft tissue mass. 3. New right adrenal nodule since 10/11/2017, suspicious for metastatic disease. 4. Right-sided Port-A-Cath with probable nonocclusive thrombus along the catheter within the right brachiocephalic vein. 5. Age advanced coronary artery atherosclerosis. Recommend assessment of coronary risk factors and consideration of medical therapy. 6. Aortic atherosclerosis (ICD10-I70.0) and emphysema (ICD10-J43.9). Electronically Signed   By: Abigail Miyamoto M.D.   On: 01/10/2018 10:58    ASSESSMENT & PLAN:  Yvonne Lang is a 52 y.o. female with history of bipolar II and lumbosacral radiculitis.   1. 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  -I previously reviewed her mammogram and her chest wall mas biopsy results with patient and her  family members.  -The biopsy of the chest wall mass inferior medial to left breast from 09/20/2017 revealed adenocarcinoma. The  Immunohistochemistry shows the tumor is strongly positive with cytokeratin AE1/AE3, cytokeratin 7 and shows moderate weak positivity with CDX-2. The tumor is negative with estrogen receptor, progesterone receptor, GCDFP, GATA-3, Napsin A, TTF-1, WT-1 and cytokeratin 20. The immunophenotype is consistent with metastatic carcinoma, favor upper GI origin.  -She had a history of gastric bypass in the past, chronic GERD, and has worsening gastric pain and burning sensation lately, which requires high-dose antacid medications.  She underwent EGD which was negative for malignancy. -I reviewed her staging CT images and Brain MRI from 09/2017 with patient and her family in person.  She has a large right hilar/lower lobe mass, with obstruction to the right middle lobe.  This is consistent with primary lung cancer. Unfortunately, her CT scan was done without contrast, due to the difficulty to access her vein -PET scan was denied by her insurance -Due to her worsening dyspnea, she received a short course of palliative radiation to her lung mass. -She has started to systemic chemotherapy with carboplatin, paclitaxel, Atezolizumab and bevacizumab.  Bevacizumab was held for first cycle due to pending port placement.  Chemo dose was slightly decreased for cycle 1 due to her limited performance status. -I reviewed her recent CT scan from January 10, 2018 image by myself, and discussed the findings with patient and her sister in details.  Her primary tumor, adenopathy, and the substernal subcutaneous metastasis has much improved, there is a 9 mm new lesion in the right adrenal gland, indeterminate, will continue monitoring.  No other evidence of new metastasis. -She is planning to go on a vacation soon. I advised her to schedule chemo 3 weeks after chemo to allow herself to recover -Labs reviewed, CBC showed Hg 11.0, Hct 34.4. CMP and Ferritin pending.  She is clinically doing well, will proceed cycle 4 chemotherapy today, and  continue every 3 weeks for 2 more cycles.  If her next scan remains stable or improved, we will switch her to Atezoliaumab maintenance therapy. -Next treatment in 4 weeks.  2. Frontal headaches   -She denies history of previous or chronic headaches, now presented with persistent headaches for the past 2 to 3 weeks. -10/14/17 Brain MRI shows no metastatic disease but shows C3-C4 disc and endplate degeneration.  -Tramadol doe not effect her headaches but she is taking Fioricet with some relief. -she was seen by Dr. Mickeal Skinner -near resolved now   3. Anxiety and Depression - I previously prescribed alprazolam (Xanax) and lorazepam on 10/04/17 -She declines being depressed, but notes to being stressed.  -She is willing to start Xanax for her stress  4. GERD - I previously prescribed sucralfate and pantoprazole on 10/04/17  5. Chronic back pain  -pain stable  -On tramadol 50 mg q6hours, I refilled previously   6. Low appetite, Nausea  -I offered her the option of Mirtazapine for appetite stimulant which is also antidepressant. She is interested, I prescribed on 10/17/17 -I prescribed her antiemetics,  compazine and Zofran on 10/17/2017 -Continue mirtazapine and antiemetics, appetite has improved, nausea is manageable.  7. Dry cough at night -She tries to elevate her head when she sleeps for relief -I advised her to use OTC cough medications and monitor for fever, worsening cough, or sputum production  Plan: -Lab reviewed, adequate for treatment, will proceed to cycle 4 chemotherapy today -f/u and next cycle in 4 weeks (due to her  trip in 3 weeks)    No orders of the defined types were placed in this encounter.   All questions were answered. The patient knows to call the clinic with any problems, questions or concerns. I spent 20 minutes counseling the patient face to face. The total time spent in the appointment was 25 minutes and more than 50% was on counseling.  Her sister and her mother  had multiple questions, I answered to the best of my knowledge.  Dierdre Searles Dweik am acting as scribe for Dr. Truitt Merle.  I have reviewed the above documentation for accuracy and completeness, and I agree with the above.     Truitt Merle, MD 01/12/2018 4:51 PM

## 2018-01-11 ENCOUNTER — Other Ambulatory Visit: Payer: Self-pay | Admitting: Hematology

## 2018-01-11 DIAGNOSIS — D630 Anemia in neoplastic disease: Secondary | ICD-10-CM

## 2018-01-12 ENCOUNTER — Inpatient Hospital Stay: Payer: No Typology Code available for payment source

## 2018-01-12 ENCOUNTER — Encounter: Payer: Self-pay | Admitting: Hematology

## 2018-01-12 ENCOUNTER — Telehealth: Payer: Self-pay | Admitting: Hematology

## 2018-01-12 ENCOUNTER — Inpatient Hospital Stay (HOSPITAL_BASED_OUTPATIENT_CLINIC_OR_DEPARTMENT_OTHER): Payer: No Typology Code available for payment source | Admitting: Hematology

## 2018-01-12 VITALS — BP 115/89 | HR 92 | Temp 97.7°F | Resp 18 | Ht 67.0 in | Wt 189.8 lb

## 2018-01-12 DIAGNOSIS — R740 Nonspecific elevation of levels of transaminase and lactic acid dehydrogenase [LDH]: Secondary | ICD-10-CM

## 2018-01-12 DIAGNOSIS — R530 Neoplastic (malignant) related fatigue: Secondary | ICD-10-CM

## 2018-01-12 DIAGNOSIS — Z5189 Encounter for other specified aftercare: Secondary | ICD-10-CM | POA: Diagnosis not present

## 2018-01-12 DIAGNOSIS — K219 Gastro-esophageal reflux disease without esophagitis: Secondary | ICD-10-CM

## 2018-01-12 DIAGNOSIS — Z95828 Presence of other vascular implants and grafts: Secondary | ICD-10-CM

## 2018-01-12 DIAGNOSIS — M5417 Radiculopathy, lumbosacral region: Secondary | ICD-10-CM

## 2018-01-12 DIAGNOSIS — Z7982 Long term (current) use of aspirin: Secondary | ICD-10-CM

## 2018-01-12 DIAGNOSIS — Z79899 Other long term (current) drug therapy: Secondary | ICD-10-CM

## 2018-01-12 DIAGNOSIS — D630 Anemia in neoplastic disease: Secondary | ICD-10-CM

## 2018-01-12 DIAGNOSIS — R05 Cough: Secondary | ICD-10-CM

## 2018-01-12 DIAGNOSIS — R110 Nausea: Secondary | ICD-10-CM

## 2018-01-12 DIAGNOSIS — C3431 Malignant neoplasm of lower lobe, right bronchus or lung: Secondary | ICD-10-CM

## 2018-01-12 DIAGNOSIS — R51 Headache: Secondary | ICD-10-CM

## 2018-01-12 DIAGNOSIS — Z923 Personal history of irradiation: Secondary | ICD-10-CM

## 2018-01-12 DIAGNOSIS — Z800 Family history of malignant neoplasm of digestive organs: Secondary | ICD-10-CM

## 2018-01-12 DIAGNOSIS — C3490 Malignant neoplasm of unspecified part of unspecified bronchus or lung: Secondary | ICD-10-CM

## 2018-01-12 DIAGNOSIS — F419 Anxiety disorder, unspecified: Secondary | ICD-10-CM

## 2018-01-12 DIAGNOSIS — C7889 Secondary malignant neoplasm of other digestive organs: Secondary | ICD-10-CM

## 2018-01-12 DIAGNOSIS — G8929 Other chronic pain: Secondary | ICD-10-CM | POA: Diagnosis not present

## 2018-01-12 DIAGNOSIS — F3181 Bipolar II disorder: Secondary | ICD-10-CM

## 2018-01-12 DIAGNOSIS — Z809 Family history of malignant neoplasm, unspecified: Secondary | ICD-10-CM

## 2018-01-12 DIAGNOSIS — F17210 Nicotine dependence, cigarettes, uncomplicated: Secondary | ICD-10-CM

## 2018-01-12 DIAGNOSIS — Z9884 Bariatric surgery status: Secondary | ICD-10-CM

## 2018-01-12 LAB — CBC WITH DIFFERENTIAL (CANCER CENTER ONLY)
ABS IMMATURE GRANULOCYTES: 0.01 10*3/uL (ref 0.00–0.07)
Basophils Absolute: 0.1 10*3/uL (ref 0.0–0.1)
Basophils Relative: 1 %
Eosinophils Absolute: 0.1 10*3/uL (ref 0.0–0.5)
Eosinophils Relative: 1 %
HCT: 34.4 % — ABNORMAL LOW (ref 36.0–46.0)
HEMOGLOBIN: 11 g/dL — AB (ref 12.0–15.0)
Immature Granulocytes: 0 %
LYMPHS ABS: 1.5 10*3/uL (ref 0.7–4.0)
LYMPHS PCT: 20 %
MCH: 28.8 pg (ref 26.0–34.0)
MCHC: 32 g/dL (ref 30.0–36.0)
MCV: 90.1 fL (ref 80.0–100.0)
MONO ABS: 1 10*3/uL (ref 0.1–1.0)
Monocytes Relative: 13 %
Neutro Abs: 4.6 10*3/uL (ref 1.7–7.7)
Neutrophils Relative %: 65 %
Platelet Count: 366 10*3/uL (ref 150–400)
RBC: 3.82 MIL/uL — AB (ref 3.87–5.11)
RDW: 15.1 % (ref 11.5–15.5)
WBC: 7.1 10*3/uL (ref 4.0–10.5)
nRBC: 0 % (ref 0.0–0.2)

## 2018-01-12 LAB — CMP (CANCER CENTER ONLY)
ALBUMIN: 2.8 g/dL — AB (ref 3.5–5.0)
ALK PHOS: 148 U/L — AB (ref 38–126)
ALT: 13 U/L (ref 0–44)
AST: 17 U/L (ref 15–41)
Anion gap: 10 (ref 5–15)
BUN: 4 mg/dL — ABNORMAL LOW (ref 6–20)
CALCIUM: 8.5 mg/dL — AB (ref 8.9–10.3)
CO2: 27 mmol/L (ref 22–32)
CREATININE: 0.66 mg/dL (ref 0.44–1.00)
Chloride: 102 mmol/L (ref 98–111)
GFR, Estimated: >60 mL/min (ref >60)
GLUCOSE: 128 mg/dL — AB (ref 70–99)
Potassium: 4.2 mmol/L (ref 3.5–5.1)
SODIUM: 139 mmol/L (ref 135–145)
Total Bilirubin: <0.2 mg/dL — ABNORMAL LOW (ref 0.3–1.2)
Total Protein: 6.3 g/dL — ABNORMAL LOW (ref 6.5–8.1)

## 2018-01-12 LAB — FERRITIN: Ferritin: 117 ng/mL (ref 11–307)

## 2018-01-12 MED ORDER — PALONOSETRON HCL INJECTION 0.25 MG/5ML
INTRAVENOUS | Status: AC
  Filled 2018-01-12: qty 5

## 2018-01-12 MED ORDER — DIPHENHYDRAMINE HCL 50 MG/ML IJ SOLN
50.0000 mg | Freq: Once | INTRAMUSCULAR | Status: AC
  Administered 2018-01-12: 50 mg via INTRAVENOUS

## 2018-01-12 MED ORDER — SODIUM CHLORIDE 0.9 % IV SOLN
175.0000 mg/m2 | Freq: Once | INTRAVENOUS | Status: AC
  Administered 2018-01-12: 354 mg via INTRAVENOUS
  Filled 2018-01-12: qty 59

## 2018-01-12 MED ORDER — SODIUM CHLORIDE 0.9% FLUSH
10.0000 mL | INTRAVENOUS | Status: DC | PRN
  Administered 2018-01-12: 10 mL
  Filled 2018-01-12: qty 10

## 2018-01-12 MED ORDER — FAMOTIDINE IN NACL 20-0.9 MG/50ML-% IV SOLN
20.0000 mg | Freq: Once | INTRAVENOUS | Status: AC
  Administered 2018-01-12: 20 mg via INTRAVENOUS

## 2018-01-12 MED ORDER — SODIUM CHLORIDE 0.9 % IV SOLN
1200.0000 mg | Freq: Once | INTRAVENOUS | Status: AC
  Administered 2018-01-12: 1200 mg via INTRAVENOUS
  Filled 2018-01-12: qty 20

## 2018-01-12 MED ORDER — SODIUM CHLORIDE 0.9 % IV SOLN
679.0000 mg | Freq: Once | INTRAVENOUS | Status: AC
  Administered 2018-01-12: 680 mg via INTRAVENOUS
  Filled 2018-01-12: qty 68

## 2018-01-12 MED ORDER — PALONOSETRON HCL INJECTION 0.25 MG/5ML
0.2500 mg | Freq: Once | INTRAVENOUS | Status: AC
  Administered 2018-01-12: 0.25 mg via INTRAVENOUS

## 2018-01-12 MED ORDER — FAMOTIDINE IN NACL 20-0.9 MG/50ML-% IV SOLN
INTRAVENOUS | Status: AC
  Filled 2018-01-12: qty 50

## 2018-01-12 MED ORDER — SODIUM CHLORIDE 0.9 % IV SOLN
Freq: Once | INTRAVENOUS | Status: AC
  Administered 2018-01-12: 10:00:00 via INTRAVENOUS
  Filled 2018-01-12: qty 250

## 2018-01-12 MED ORDER — DIPHENHYDRAMINE HCL 50 MG/ML IJ SOLN
INTRAMUSCULAR | Status: AC
  Filled 2018-01-12: qty 1

## 2018-01-12 MED ORDER — SODIUM CHLORIDE 0.9 % IV SOLN
Freq: Once | INTRAVENOUS | Status: AC
  Administered 2018-01-12: 11:00:00 via INTRAVENOUS
  Filled 2018-01-12: qty 5

## 2018-01-12 MED ORDER — SODIUM CHLORIDE 0.9 % IV SOLN
15.0000 mg/kg | Freq: Once | INTRAVENOUS | Status: AC
  Administered 2018-01-12: 1300 mg via INTRAVENOUS
  Filled 2018-01-12: qty 48

## 2018-01-12 MED ORDER — HEPARIN SOD (PORK) LOCK FLUSH 100 UNIT/ML IV SOLN
500.0000 [IU] | Freq: Once | INTRAVENOUS | Status: AC | PRN
  Administered 2018-01-12: 500 [IU]
  Filled 2018-01-12: qty 5

## 2018-01-12 NOTE — Patient Instructions (Signed)
[facility] Discharge Instructions for Patients Receiving Chemotherapy  Today you received the following chemotherapy agents: Avastin, Tecentriq, Taxol, and Carboplatin.   To help prevent nausea and vomiting after your treatment, we encourage you to take your nausea medication as directed.   If you develop nausea and vomiting that is not controlled by your nausea medication, call the clinic.   BELOW ARE SYMPTOMS THAT SHOULD BE REPORTED IMMEDIATELY:  *FEVER GREATER THAN 100.5 F  *CHILLS WITH OR WITHOUT FEVER  NAUSEA AND VOMITING THAT IS NOT CONTROLLED WITH YOUR NAUSEA MEDICATION  *UNUSUAL SHORTNESS OF BREATH  *UNUSUAL BRUISING OR BLEEDING  TENDERNESS IN MOUTH AND THROAT WITH OR WITHOUT PRESENCE OF ULCERS  *URINARY PROBLEMS  *BOWEL PROBLEMS  UNUSUAL RASH Items with * indicate a potential emergency and should be followed up as soon as possible.  Feel free to call the clinic should you have any questions or concerns. The clinic phone number is [phone].  Please show the [facility] at check-in to the Emergency Department and triage nurse.

## 2018-01-12 NOTE — Telephone Encounter (Signed)
Appts scheduled avs/calendar printed per 10/24 los

## 2018-01-14 ENCOUNTER — Inpatient Hospital Stay: Payer: No Typology Code available for payment source

## 2018-01-14 VITALS — BP 102/81 | HR 99 | Temp 98.4°F | Resp 18

## 2018-01-14 DIAGNOSIS — Z5189 Encounter for other specified aftercare: Secondary | ICD-10-CM | POA: Diagnosis not present

## 2018-01-14 DIAGNOSIS — C3490 Malignant neoplasm of unspecified part of unspecified bronchus or lung: Secondary | ICD-10-CM

## 2018-01-14 MED ORDER — PEGFILGRASTIM-CBQV 6 MG/0.6ML ~~LOC~~ SOSY
PREFILLED_SYRINGE | SUBCUTANEOUS | Status: AC
  Filled 2018-01-14: qty 0.6

## 2018-01-14 MED ORDER — PEGFILGRASTIM-CBQV 6 MG/0.6ML ~~LOC~~ SOSY
6.0000 mg | PREFILLED_SYRINGE | Freq: Once | SUBCUTANEOUS | Status: AC
  Administered 2018-01-14: 6 mg via SUBCUTANEOUS

## 2018-01-14 NOTE — Patient Instructions (Signed)
Pegfilgrastim injection What is this medicine? PEGFILGRASTIM (PEG fil gra stim) is a long-acting granulocyte colony-stimulating factor that stimulates the growth of neutrophils, a type of white blood cell important in the body's fight against infection. It is used to reduce the incidence of fever and infection in patients with certain types of cancer who are receiving chemotherapy that affects the bone marrow, and to increase survival after being exposed to high doses of radiation. This medicine may be used for other purposes; ask your health care provider or pharmacist if you have questions. COMMON BRAND NAME(S): Neulasta,Udenyca  What should I tell my health care provider before I take this medicine? They need to know if you have any of these conditions: -kidney disease -latex allergy -ongoing radiation therapy -sickle cell disease -skin reactions to acrylic adhesives (On-Body Injector only) -an unusual or allergic reaction to pegfilgrastim, filgrastim, other medicines, foods, dyes, or preservatives -pregnant or trying to get pregnant -breast-feeding How should I use this medicine? This medicine is for injection under the skin. If you get this medicine at home, you will be taught how to prepare and give the pre-filled syringe or how to use the On-body Injector. Refer to the patient Instructions for Use for detailed instructions. Use exactly as directed. Tell your healthcare provider immediately if you suspect that the On-body Injector may not have performed as intended or if you suspect the use of the On-body Injector resulted in a missed or partial dose. It is important that you put your used needles and syringes in a special sharps container. Do not put them in a trash can. If you do not have a sharps container, call your pharmacist or healthcare provider to get one. Talk to your pediatrician regarding the use of this medicine in children. While this drug may be prescribed for selected  conditions, precautions do apply. Overdosage: If you think you have taken too much of this medicine contact a poison control center or emergency room at once. NOTE: This medicine is only for you. Do not share this medicine with others. What if I miss a dose? It is important not to miss your dose. Call your doctor or health care professional if you miss your dose. If you miss a dose due to an On-body Injector failure or leakage, a new dose should be administered as soon as possible using a single prefilled syringe for manual use. What may interact with this medicine? Interactions have not been studied. Give your health care provider a list of all the medicines, herbs, non-prescription drugs, or dietary supplements you use. Also tell them if you smoke, drink alcohol, or use illegal drugs. Some items may interact with your medicine. This list may not describe all possible interactions. Give your health care provider a list of all the medicines, herbs, non-prescription drugs, or dietary supplements you use. Also tell them if you smoke, drink alcohol, or use illegal drugs. Some items may interact with your medicine. What should I watch for while using this medicine? You may need blood work done while you are taking this medicine. If you are going to need a MRI, CT scan, or other procedure, tell your doctor that you are using this medicine (On-Body Injector only). What side effects may I notice from receiving this medicine? Side effects that you should report to your doctor or health care professional as soon as possible: -allergic reactions like skin rash, itching or hives, swelling of the face, lips, or tongue -dizziness -fever -pain, redness, or irritation at   site where injected -pinpoint red spots on the skin -red or dark-brown urine -shortness of breath or breathing problems -stomach or side pain, or pain at the shoulder -swelling -tiredness -trouble passing urine or change in the amount of  urine Side effects that usually do not require medical attention (report to your doctor or health care professional if they continue or are bothersome): -bone pain -muscle pain This list may not describe all possible side effects. Call your doctor for medical advice about side effects. You may report side effects to FDA at 1-800-FDA-1088. Where should I keep my medicine? Keep out of the reach of children. Store pre-filled syringes in a refrigerator between 2 and 8 degrees C (36 and 46 degrees F). Do not freeze. Keep in carton to protect from light. Throw away this medicine if it is left out of the refrigerator for more than 48 hours. Throw away any unused medicine after the expiration date. NOTE: This sheet is a summary. It may not cover all possible information. If you have questions about this medicine, talk to your doctor, pharmacist, or health care provider.  2018 Elsevier/Gold Standard (2016-03-04 12:58:03)  

## 2018-01-18 ENCOUNTER — Other Ambulatory Visit: Payer: Self-pay | Admitting: Nurse Practitioner

## 2018-01-18 DIAGNOSIS — C3490 Malignant neoplasm of unspecified part of unspecified bronchus or lung: Secondary | ICD-10-CM

## 2018-01-19 ENCOUNTER — Other Ambulatory Visit: Payer: Self-pay | Admitting: Hematology

## 2018-01-19 ENCOUNTER — Other Ambulatory Visit: Payer: Self-pay

## 2018-01-19 ENCOUNTER — Other Ambulatory Visit: Payer: Self-pay | Admitting: Nurse Practitioner

## 2018-01-19 ENCOUNTER — Encounter: Payer: Self-pay | Admitting: Hematology

## 2018-01-19 DIAGNOSIS — R51 Headache: Principal | ICD-10-CM

## 2018-01-19 DIAGNOSIS — R519 Headache, unspecified: Secondary | ICD-10-CM

## 2018-01-19 DIAGNOSIS — C3490 Malignant neoplasm of unspecified part of unspecified bronchus or lung: Secondary | ICD-10-CM

## 2018-01-19 MED ORDER — BUTALBITAL-APAP-CAFFEINE 50-325-40 MG PO TABS: 1.0000 | ORAL_TABLET | Freq: Four times a day (QID) | ORAL | 1 refills | Status: AC | PRN

## 2018-01-19 MED ORDER — ALPRAZOLAM 0.25 MG PO TABS: 0.2500 mg | ORAL_TABLET | Freq: Every evening | ORAL | 0 refills | Status: DC | PRN

## 2018-01-20 ENCOUNTER — Encounter: Payer: Self-pay | Admitting: Hematology

## 2018-01-21 ENCOUNTER — Encounter: Payer: Self-pay | Admitting: Hematology

## 2018-01-23 ENCOUNTER — Other Ambulatory Visit: Payer: Self-pay | Admitting: Hematology

## 2018-01-23 ENCOUNTER — Encounter: Payer: Self-pay | Admitting: Hematology

## 2018-01-23 ENCOUNTER — Telehealth: Payer: Self-pay | Admitting: Internal Medicine

## 2018-01-23 NOTE — Telephone Encounter (Signed)
Scheduled appt per 11/4 sch message - pt is aware of [facility]

## 2018-01-24 ENCOUNTER — Encounter: Payer: Self-pay | Admitting: Nurse Practitioner

## 2018-01-24 ENCOUNTER — Encounter: Payer: Self-pay | Admitting: Hematology

## 2018-01-24 ENCOUNTER — Telehealth: Payer: Self-pay | Admitting: *Deleted

## 2018-01-24 NOTE — Telephone Encounter (Signed)
Called patient to find out what information she is needing [place] to send. I explained that EOB is usually something her insurance provides. I asked her to get [place] the phone number of her contact so we can see what they need.

## 2018-01-24 NOTE — Progress Notes (Signed)
Called pt to address her questions regarding applying for Medicaid.  I left a msg and informed her that she can apply for Medicaid online or I can mail her an application and she would have to turn it in to [place] in her county.  Stated she can call me if she had additional questions.

## 2018-01-24 NOTE — Telephone Encounter (Signed)
Left message for her insurance contact to call us back and let us know what they are needing from [place].

## 2018-01-24 NOTE — Telephone Encounter (Signed)
Spoke with Daiva Huge- he is needing her EOB from her insurance Co. He will call his contact at [employer] employer for this information  [facility] notified

## 2018-01-25 ENCOUNTER — Encounter: Payer: Self-pay | Admitting: Nurse Practitioner

## 2018-02-01 ENCOUNTER — Encounter: Payer: Self-pay | Admitting: Hematology

## 2018-02-01 NOTE — Progress Notes (Signed)
Rcv'd staff msg from [facility] requesting I call pt to address her billing questions.  I called pt and she asked about catastrophe coverage.  I informed her I wasn't familiar w/ that but the hospital has a [place] settlement program she can apply for if her balance is $5000 or greater.  She said it was so I gave her the number to the billing department so she can apply.  She thanked me for my help.

## 2018-02-03 ENCOUNTER — Ambulatory Visit: Payer: 59 | Admitting: Hematology and Oncology

## 2018-02-09 ENCOUNTER — Other Ambulatory Visit: Payer: Self-pay | Admitting: Nurse Practitioner

## 2018-02-09 ENCOUNTER — Other Ambulatory Visit: Payer: Self-pay | Admitting: Hematology

## 2018-02-09 DIAGNOSIS — E876 Hypokalemia: Secondary | ICD-10-CM

## 2018-02-09 DIAGNOSIS — C3490 Malignant neoplasm of unspecified part of unspecified bronchus or lung: Secondary | ICD-10-CM

## 2018-02-09 MED ORDER — POTASSIUM CHLORIDE CRYS ER 20 MEQ PO TBCR: 20.0000 meq | EXTENDED_RELEASE_TABLET | Freq: Every day | ORAL | 0 refills | Status: DC

## 2018-02-09 MED ORDER — ALPRAZOLAM 0.25 MG PO TABS: 0.2500 mg | ORAL_TABLET | Freq: Every evening | ORAL | 0 refills | Status: DC | PRN

## 2018-02-10 ENCOUNTER — Inpatient Hospital Stay: Payer: No Typology Code available for payment source

## 2018-02-10 ENCOUNTER — Inpatient Hospital Stay: Payer: No Typology Code available for payment source | Attending: Hematology

## 2018-02-10 ENCOUNTER — Encounter: Payer: Self-pay | Admitting: Hematology

## 2018-02-10 ENCOUNTER — Other Ambulatory Visit: Payer: Self-pay | Admitting: Medical

## 2018-02-10 ENCOUNTER — Inpatient Hospital Stay (HOSPITAL_BASED_OUTPATIENT_CLINIC_OR_DEPARTMENT_OTHER): Payer: No Typology Code available for payment source | Admitting: Medical

## 2018-02-10 ENCOUNTER — Inpatient Hospital Stay (HOSPITAL_BASED_OUTPATIENT_CLINIC_OR_DEPARTMENT_OTHER): Payer: No Typology Code available for payment source | Admitting: Hematology

## 2018-02-10 VITALS — BP 121/89 | HR 100 | Temp 98.0°F | Resp 18 | Ht 67.0 in | Wt 188.3 lb

## 2018-02-10 DIAGNOSIS — Z923 Personal history of irradiation: Secondary | ICD-10-CM | POA: Diagnosis not present

## 2018-02-10 DIAGNOSIS — Z5189 Encounter for other specified aftercare: Secondary | ICD-10-CM | POA: Insufficient documentation

## 2018-02-10 DIAGNOSIS — Z5112 Encounter for antineoplastic immunotherapy: Secondary | ICD-10-CM | POA: Insufficient documentation

## 2018-02-10 DIAGNOSIS — R059 Cough, unspecified: Secondary | ICD-10-CM

## 2018-02-10 DIAGNOSIS — Z79899 Other long term (current) drug therapy: Secondary | ICD-10-CM | POA: Insufficient documentation

## 2018-02-10 DIAGNOSIS — F419 Anxiety disorder, unspecified: Secondary | ICD-10-CM

## 2018-02-10 DIAGNOSIS — C7989 Secondary malignant neoplasm of other specified sites: Secondary | ICD-10-CM

## 2018-02-10 DIAGNOSIS — R05 Cough: Secondary | ICD-10-CM

## 2018-02-10 DIAGNOSIS — G8929 Other chronic pain: Secondary | ICD-10-CM | POA: Diagnosis not present

## 2018-02-10 DIAGNOSIS — F17210 Nicotine dependence, cigarettes, uncomplicated: Secondary | ICD-10-CM | POA: Insufficient documentation

## 2018-02-10 DIAGNOSIS — C3431 Malignant neoplasm of lower lobe, right bronchus or lung: Secondary | ICD-10-CM | POA: Diagnosis not present

## 2018-02-10 DIAGNOSIS — M549 Dorsalgia, unspecified: Secondary | ICD-10-CM | POA: Diagnosis not present

## 2018-02-10 DIAGNOSIS — Z5111 Encounter for antineoplastic chemotherapy: Secondary | ICD-10-CM | POA: Diagnosis present

## 2018-02-10 DIAGNOSIS — Z95828 Presence of other vascular implants and grafts: Secondary | ICD-10-CM

## 2018-02-10 DIAGNOSIS — C3490 Malignant neoplasm of unspecified part of unspecified bronchus or lung: Secondary | ICD-10-CM

## 2018-02-10 DIAGNOSIS — F329 Major depressive disorder, single episode, unspecified: Secondary | ICD-10-CM | POA: Insufficient documentation

## 2018-02-10 DIAGNOSIS — R110 Nausea: Secondary | ICD-10-CM | POA: Diagnosis not present

## 2018-02-10 DIAGNOSIS — Z7982 Long term (current) use of aspirin: Secondary | ICD-10-CM

## 2018-02-10 DIAGNOSIS — Z8249 Family history of ischemic heart disease and other diseases of the circulatory system: Secondary | ICD-10-CM | POA: Insufficient documentation

## 2018-02-10 DIAGNOSIS — G4489 Other headache syndrome: Secondary | ICD-10-CM | POA: Diagnosis not present

## 2018-02-10 LAB — TOTAL PROTEIN, URINE DIPSTICK: PROTEIN: NEGATIVE mg/dL

## 2018-02-10 LAB — CMP (CANCER CENTER ONLY)
ALT: 14 U/L (ref 0–44)
AST: 16 U/L (ref 15–41)
Albumin: 3 g/dL — ABNORMAL LOW (ref 3.5–5.0)
Alkaline Phosphatase: 163 U/L — ABNORMAL HIGH (ref 38–126)
Anion gap: 9 (ref 5–15)
BUN: <4 mg/dL — ABNORMAL LOW (ref 6–20)
CO2: 27 mmol/L (ref 22–32)
CREATININE: 0.66 mg/dL (ref 0.44–1.00)
Calcium: 8.6 mg/dL — ABNORMAL LOW (ref 8.9–10.3)
Chloride: 104 mmol/L (ref 98–111)
Glucose, Bld: 99 mg/dL (ref 70–99)
Potassium: 3.8 mmol/L (ref 3.5–5.1)
Sodium: 140 mmol/L (ref 135–145)
TOTAL PROTEIN: 6.7 g/dL (ref 6.5–8.1)
Total Bilirubin: <0.2 mg/dL — ABNORMAL LOW (ref 0.3–1.2)

## 2018-02-10 LAB — CBC WITH DIFFERENTIAL (CANCER CENTER ONLY)
Abs Immature Granulocytes: 0.02 10*3/uL (ref 0.00–0.07)
BASOS ABS: 0 10*3/uL (ref 0.0–0.1)
Basophils Relative: 0 %
EOS ABS: 0.2 10*3/uL (ref 0.0–0.5)
EOS PCT: 3 %
HEMATOCRIT: 36.7 % (ref 36.0–46.0)
Hemoglobin: 11.6 g/dL — ABNORMAL LOW (ref 12.0–15.0)
Immature Granulocytes: 0 %
Lymphocytes Relative: 17 %
Lymphs Abs: 1.5 10*3/uL (ref 0.7–4.0)
MCH: 29.2 pg (ref 26.0–34.0)
MCHC: 31.6 g/dL (ref 30.0–36.0)
MCV: 92.4 fL (ref 80.0–100.0)
Monocytes Absolute: 1.1 10*3/uL — ABNORMAL HIGH (ref 0.1–1.0)
Monocytes Relative: 13 %
NRBC: 0 % (ref 0.0–0.2)
Neutro Abs: 6.1 10*3/uL (ref 1.7–7.7)
Neutrophils Relative %: 67 %
Platelet Count: 448 10*3/uL — ABNORMAL HIGH (ref 150–400)
RBC: 3.97 MIL/uL (ref 3.87–5.11)
RDW: 15.2 % (ref 11.5–15.5)
WBC: 8.9 10*3/uL (ref 4.0–10.5)

## 2018-02-10 MED ORDER — ALBUTEROL SULFATE (2.5 MG/3ML) 0.083% IN NEBU
INHALATION_SOLUTION | RESPIRATORY_TRACT | Status: AC
  Filled 2018-02-10: qty 3

## 2018-02-10 MED ORDER — ALBUTEROL SULFATE (2.5 MG/3ML) 0.083% IN NEBU
2.5000 mg | INHALATION_SOLUTION | Freq: Once | RESPIRATORY_TRACT | Status: AC
  Administered 2018-02-10: 2.5 mg via RESPIRATORY_TRACT
  Filled 2018-02-10: qty 3

## 2018-02-10 MED ORDER — SODIUM CHLORIDE 0.9 % IV SOLN
1200.0000 mg | Freq: Once | INTRAVENOUS | Status: AC
  Administered 2018-02-10: 1200 mg via INTRAVENOUS
  Filled 2018-02-10: qty 20

## 2018-02-10 MED ORDER — DIPHENHYDRAMINE HCL 50 MG/ML IJ SOLN
50.0000 mg | Freq: Once | INTRAMUSCULAR | Status: AC
  Administered 2018-02-10: 50 mg via INTRAVENOUS

## 2018-02-10 MED ORDER — PALONOSETRON HCL INJECTION 0.25 MG/5ML
INTRAVENOUS | Status: AC
  Filled 2018-02-10: qty 5

## 2018-02-10 MED ORDER — FAMOTIDINE IN NACL 20-0.9 MG/50ML-% IV SOLN
20.0000 mg | Freq: Once | INTRAVENOUS | Status: AC
  Administered 2018-02-10: 20 mg via INTRAVENOUS

## 2018-02-10 MED ORDER — PALONOSETRON HCL INJECTION 0.25 MG/5ML
0.2500 mg | Freq: Once | INTRAVENOUS | Status: AC
  Administered 2018-02-10: 0.25 mg via INTRAVENOUS

## 2018-02-10 MED ORDER — FAMOTIDINE IN NACL 20-0.9 MG/50ML-% IV SOLN
INTRAVENOUS | Status: AC
  Filled 2018-02-10: qty 50

## 2018-02-10 MED ORDER — ALBUTEROL SULFATE HFA 108 (90 BASE) MCG/ACT IN AERS: 2.0000 | INHALATION_SPRAY | Freq: Two times a day (BID) | RESPIRATORY_TRACT | 2 refills | Status: DC

## 2018-02-10 MED ORDER — SODIUM CHLORIDE 0.9 % IV SOLN
Freq: Once | INTRAVENOUS | Status: AC
  Administered 2018-02-10: 10:00:00 via INTRAVENOUS
  Filled 2018-02-10: qty 250

## 2018-02-10 MED ORDER — HEPARIN SOD (PORK) LOCK FLUSH 100 UNIT/ML IV SOLN
500.0000 [IU] | Freq: Once | INTRAVENOUS | Status: AC | PRN
  Administered 2018-02-10: 500 [IU]
  Filled 2018-02-10: qty 5

## 2018-02-10 MED ORDER — SODIUM CHLORIDE 0.9% FLUSH
10.0000 mL | INTRAVENOUS | Status: DC | PRN
  Administered 2018-02-10: 10 mL
  Filled 2018-02-10: qty 10

## 2018-02-10 MED ORDER — SODIUM CHLORIDE 0.9 % IV SOLN
175.0000 mg/m2 | Freq: Once | INTRAVENOUS | Status: AC
  Administered 2018-02-10: 354 mg via INTRAVENOUS
  Filled 2018-02-10: qty 59

## 2018-02-10 MED ORDER — IPRATROPIUM-ALBUTEROL 0.5-2.5 (3) MG/3ML IN SOLN
3.0000 mL | Freq: Once | RESPIRATORY_TRACT | Status: DC
  Filled 2018-02-10: qty 3

## 2018-02-10 MED ORDER — SODIUM CHLORIDE 0.9 % IV SOLN
15.0000 mg/kg | Freq: Once | INTRAVENOUS | Status: AC
  Administered 2018-02-10: 1300 mg via INTRAVENOUS
  Filled 2018-02-10: qty 48

## 2018-02-10 MED ORDER — SODIUM CHLORIDE 0.9 % IV SOLN
679.0000 mg | Freq: Once | INTRAVENOUS | Status: AC
  Administered 2018-02-10: 680 mg via INTRAVENOUS
  Filled 2018-02-10: qty 68

## 2018-02-10 MED ORDER — SODIUM CHLORIDE 0.9 % IV SOLN
Freq: Once | INTRAVENOUS | Status: AC
  Administered 2018-02-10: 10:00:00 via INTRAVENOUS
  Filled 2018-02-10: qty 5

## 2018-02-10 MED ORDER — DIPHENHYDRAMINE HCL 50 MG/ML IJ SOLN
INTRAMUSCULAR | Status: AC
  Filled 2018-02-10: qty 1

## 2018-02-10 NOTE — Patient Instructions (Signed)
[facility] Discharge Instructions for Patients Receiving Chemotherapy  Today you received the following chemotherapy agents: Avastin, Tecentriq, Taxol, and Carboplatin.   To help prevent nausea and vomiting after your treatment, we encourage you to take your nausea medication as directed.   If you develop nausea and vomiting that is not controlled by your nausea medication, call the clinic.   BELOW ARE SYMPTOMS THAT SHOULD BE REPORTED IMMEDIATELY:  *FEVER GREATER THAN 100.5 F  *CHILLS WITH OR WITHOUT FEVER  NAUSEA AND VOMITING THAT IS NOT CONTROLLED WITH YOUR NAUSEA MEDICATION  *UNUSUAL SHORTNESS OF BREATH  *UNUSUAL BRUISING OR BLEEDING  TENDERNESS IN MOUTH AND THROAT WITH OR WITHOUT PRESENCE OF ULCERS  *URINARY PROBLEMS  *BOWEL PROBLEMS  UNUSUAL RASH Items with * indicate a potential emergency and should be followed up as soon as possible.  Feel free to call the clinic should you have any questions or concerns. The clinic phone number is [phone].  Please show the [place] at check-in to the Emergency Department and triage nurse.

## 2018-02-10 NOTE — Progress Notes (Signed)
[place]   [phone] [phone]   Clinic Follow up Note   Patient Care Team: Redmond School, MD as PCP - General (Internal Medicine)  Date of Service:  02/10/2018  CHIEF COMPLAINT: Follow up for Lung cancer and ongoing treatment   SUMMARY OF ONCOLOGIC HISTORY: Oncology History   Cancer Staging Metastatic non-small cell lung cancer [facility]) Staging form: Lung, AJCC 8th Edition - Clinical stage from 10/17/2017: Stage IV (cT3, cN1, cM1b) - Signed by Truitt Merle, MD on 10/17/2017       Metastatic non-small cell lung cancer ([place])   09/20/2017 Imaging    US Breast Left 09/20/17  IMPRESSION Indeterminte palpable mass measuring 3.2x1.7x2.8 cm  inferior medial to the inframammary fold of the left breast.      09/20/2017 Initial Biopsy    Diagnosis 09/20/17 Soft Tissue Needle Core Biopsy, inferior medial to IMF - ADENOCARCINOMA. - SEE MICROSCOPIC DESCRIPTION.  Microscopic Comment Immunohistochemistry will be performed and reported as an addendum. (JDP:ah 09/23/17) ADDENDUM: Immunohistochemistry shows the tumor is strongly positive with cytokeratin AE1/AE3, cytokeratin 7 and shows moderate weak positivity with CDX-2. The tumor is negative with estrogen receptor, progesterone receptor, GCDFP, GATA-3, Napsin A, TTF-1, WT-1 and cytokeratin 20. The immunophenotype is consistent with metastatic carcinoma. The combination of CDX-2 and cytokeratin 7 positivity raises the possibility of an upper gastrointestinal primary. Clinical correlation is essential.    10/06/2017 Initial Diagnosis    Metastatic adenocarcinoma involving soft tissue with unknown primary site [facility])    10/10/2017 Procedure    Upper Endoscopy by Dr. Silverio Decamp 10/10/17  IMPRESSION - Normal esophagus. - Z-line regular, 35 cm from the incisors. - Gastric bypass with a normal-sized pouch and intact staple line. Gastrojejunal anastomosis characterized by healthy appearing mucosa. - No specimens  collected.    10/12/2017 Imaging    CT CAP WO Contrast 10/12/17  IMPRESSION: 7 cm central right lung mass involving the right hilum, with postobstructive collapse of the right middle lobe, highly suspicious for primary bronchogenic carcinoma. 5 cm masslike opacity in the superior segment of the right lower lobe may represent carcinoma or postobstructive pneumonitis. 3.3 cm soft tissue mass in the lower anterior chest wall soft tissues, suspicious for metastatic disease. No evidence of abdominal or pelvic metastatic disease.     10/14/2017 Imaging    MRI Brain 10/14/17  IMPRESSION: 1. No metastatic disease or acute intracranial abnormality. Normal MRI appearance of the brain. 2. 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      10/17/2017 Cancer Staging    Staging form: Lung, AJCC 8th Edition - Clinical stage from 10/17/2017: Stage IV (cT3, cN1, cM1b) - Signed by Truitt Merle, MD on 10/17/2017    10/24/2017 - 11/04/2017 Radiation Therapy     The Right lung mass was treated to 30 Gy in 10 fractions of 3 Gy    11/08/2017 -  Chemotherapy     first line chemo with carboplatin, paclitaxel, Atezolizumab and bevacizumab every 3    01/10/2018 Imaging    01/10/2018 CT CA IMPRESSION: 1. Response to therapy of central right lower lobe lung mass. Re-expansion of the right middle lobe with significantly improved right lower lobe postobstructive pneumonitis. Residual geographic airspace and ground-glass opacity within the medial right lung, primarily favored to be radiation induced. 2. Near complete resolution of presternal soft tissue mass. 3. New right adrenal nodule since 10/11/2017, suspicious for metastatic disease. 4. Right-sided Port-A-Cath with probable nonocclusive thrombus along the catheter within the right brachiocephalic vein. 5.  Age advanced coronary artery atherosclerosis. Recommend assessment of coronary risk factors and consideration of medical therapy. 6. Aortic  atherosclerosis (ICD10-I70.0) and emphysema (ICD10-J43.9).      CURRENT THERAPY:  first line chemo with carboplatin, paclitaxel, Atezolizumab and bevacizumab every 3 starting 11/08/17    INTERVAL HISTORY:  Yvonne Lang is here for a follow up of lung cancer and chemo treatment. She presents to the clinic today with her family members. She notes having recent intermittent epistaxis and nausea which has been manageable. She notes she is no longer on prednisone. She is able to manage her breathing and has nebulizer as needed. She notes bone pain from [place]. She will start Claritin for this. She just came back from her trip to [place], and enjoyed the trip.    REVIEW OF SYSTEMS:   Constitutional: Denies fevers, chills or abnormal weight loss Eyes: Denies blurriness of vision Ears, nose, mouth, throat, and face: Denies mucositis or sore throat (+) intermittent epistaxis, manageable  Respiratory: Denies cough, dyspnea or wheezes Cardiovascular: Denies palpitation, chest discomfort or lower extremity swelling Gastrointestinal:  Denies heartburn or change in bowel habits (+) nausea, manageable MSK: (+) Bone pain  Skin: Denies abnormal skin rashes Lymphatics: Denies new lymphadenopathy or easy bruising Neurological:Denies numbness, tingling or new weaknesses Behavioral/Psych: Mood is stable, no new changes  All other systems were reviewed with the patient and are negative.  MEDICAL HISTORY:  Past Medical History:  Diagnosis Date  . Anxiety   . Bronchitis   . COPD (chronic obstructive pulmonary disease) ([place])   . Depression   . GERD (gastroesophageal reflux disease)   . H/O gastric bypass    1999  . Irritable bowel syndrome   . Lung cancer [facility])     SURGICAL HISTORY: Past Surgical History:  Procedure Laterality Date  . BACK SURGERY    . CHOLECYSTECTOMY    . COLON SURGERY     for a kink in her colon and 12 inchs removed  . COLONOSCOPY N/A 12/13/2013   Procedure:  COLONOSCOPY;  Surgeon: Rogene Houston, MD;  Location: [facility];  Service: Endoscopy;  Laterality: N/A;  200  . ESOPHAGOGASTRODUODENOSCOPY N/A 12/13/2013   Procedure: ESOPHAGOGASTRODUODENOSCOPY (EGD);  Surgeon: Rogene Houston, MD;  Location: [facility] ENDO SUITE;  Service: Endoscopy;  Laterality: N/A;  . Gastric Bypas  2000  . IR IMAGING GUIDED PORT INSERTION  11/14/2017    I have reviewed the social history and family history with the patient and they are unchanged from previous note.  ALLERGIES:  is allergic to codeine.  MEDICATIONS:  Current Outpatient Medications  Medication Sig Dispense Refill  . albuterol (PROVENTIL HFA) 108 (90 Base) MCG/ACT inhaler Inhale 2 puffs into the lungs 2 (two) times daily. 1 Inhaler 2  . albuterol (PROVENTIL) 4 MG tablet Take 4 mg by mouth 2 (two) times daily.     Marland Kitchen ALPRAZolam (XANAX) 0.25 MG tablet Take 1 tablet (0.25 mg total) by mouth at bedtime as needed for anxiety. 30 tablet 0  . aspirin 81 MG tablet Take 81 mg by mouth daily.    . butalbital-acetaminophen-caffeine (FIORICET, ESGIC) 50-325-40 MG tablet Take 1-2 tablets by mouth every 6 (six) hours as needed for headache. 60 tablet 1  . CALCIUM PO Take by mouth daily.    Marland Kitchen dicyclomine (BENTYL) 20 MG tablet Take 1 tablet (20 mg total) by mouth every 6 (six) hours. 60 tablet 1  . DULoxetine (CYMBALTA) 60 MG capsule Take 60 mg by mouth daily.    Marland Kitchen  gabapentin (NEURONTIN) 300 MG capsule TAKE 1 CAPSULE BY MOUTH 4 TIMES DAILY  4  . lidocaine-prilocaine (EMLA) cream Apply 1 application topically as needed. Apply to port one hour prior to port access/chemo 30 g 2  . magic mouthwash w/lidocaine SOLN Take 5 mLs by mouth 3 (three) times daily. Swish and spit 3 x daily 240 mL 0  . mirtazapine (REMERON) 7.5 MG tablet Take 1 tablet (7.5 mg total) by mouth at bedtime. 30 tablet 1  . Multiple Vitamin (MULTIVITAMIN) tablet Take 1 tablet by mouth daily.    Marland Kitchen omeprazole (PRILOSEC) 20 MG capsule Take 1 capsule (20 mg total)  by mouth 2 (two) times daily before a meal. 60 capsule 0  . ondansetron (ZOFRAN) 8 MG tablet TAKE 1 TABLET BY MOUTH EVERY 8 HOURS AS NEEDED FOR NAUSEA AND VOMITING 30 tablet 1  . potassium chloride SA (K-DUR,KLOR-CON) 20 MEQ tablet Take 1 tablet (20 mEq total) by mouth daily. 60 tablet 0  . prochlorperazine (COMPAZINE) 10 MG tablet Take 1 tablet (10 mg total) by mouth every 6 (six) hours as needed for nausea or vomiting. 30 tablet 0  . prochlorperazine (COMPAZINE) 10 MG tablet TAKE 1 TABLET BY MOUTH EVERY 6 HOURS AS NEEDED FOR NAUSEA AND VOMITING 30 tablet 0  . RaNITidine HCl (ZANTAC PO) Take by mouth 3 (three) times daily.    Marland Kitchen rOPINIRole (REQUIP) 0.5 MG tablet TAKE 1 TABLET BY MOUTH AT BEDTIME FOR 30 DAYS  11  . sucralfate (CARAFATE) 1 g tablet TAKE 1 TABLET(1 GRAM) BY MOUTH FOUR TIMES DAILY 120 tablet 1  . traMADol (ULTRAM) 50 MG tablet Take 1 tablet (50 mg total) by mouth every 6 (six) hours as needed. 90 tablet 0  . TRELEGY ELLIPTA 100-62.5-25 MCG/INH AEPB INHALE 1 PUFF INTO THE LUNGS EVERY DAY  5  . UNABLE TO FIND Inhale into the lungs. Med Name: Trilogy as needed     No current facility-administered medications for this visit.    Facility-Administered Medications Ordered in Other Visits  Medication Dose Route Frequency Provider Last Rate Last Dose  . atezolizumab (TECENTRIQ) 1,200 mg in sodium chloride 0.9 % 250 mL chemo infusion  1,200 mg Intravenous Once Truitt Merle, MD      . bevacizumab (AVASTIN) 1,300 mg in sodium chloride 0.9 % 100 mL chemo infusion  15 mg/kg (Treatment Plan Recorded) Intravenous Once Truitt Merle, MD      . CARBOplatin (PARAPLATIN) 680 mg in sodium chloride 0.9 % 250 mL chemo infusion  680 mg Intravenous Once Truitt Merle, MD      . famotidine (PEPCID) IVPB 20 mg premix  20 mg Intravenous Once Truitt Merle, MD 200 mL/hr at 02/10/18 0950 20 mg at 02/10/18 0950  . fosaprepitant (EMEND) 150 mg, dexamethasone (DECADRON) 12 mg in sodium chloride 0.9 % 145 mL IVPB   Intravenous Once  Truitt Merle, MD      . heparin lock flush 100 unit/mL  500 Units Intracatheter Once PRN Truitt Merle, MD      . PACLitaxel (TAXOL) 354 mg in sodium chloride 0.9 % 500 mL chemo infusion (> 80mg /m2)  175 mg/m2 (Treatment Plan Recorded) Intravenous Once Truitt Merle, MD      . sodium chloride flush (NS) 0.9 % injection 10 mL  10 mL Intracatheter PRN Truitt Merle, MD        PHYSICAL EXAMINATION: ECOG PERFORMANCE STATUS: 2 - Symptomatic, <50% confined to bed  Vitals:   02/10/18 0851  BP: 121/89  Pulse: 100  Resp: 18  Temp: 98 F (36.7 C)  SpO2: 100%   Filed Weights   02/10/18 0851  Weight: 188 lb 4.8 oz (85.4 kg)    GENERAL:alert, no distress and comfortable SKIN: skin color, texture, turgor are normal, no rashes or significant lesions EYES: normal, Conjunctiva are pink and non-injected, sclera clear OROPHARYNX:no exudate, no erythema and lips, buccal mucosa, and tongue normal  NECK: supple, thyroid normal size, non-tender, without nodularity LYMPH:  no palpable lymphadenopathy in the cervical, axillary or inguinal LUNGS: clear to auscultation and percussion with normal breathing effort (+) Wheezing throughout both lungs. HEART: regular rate & rhythm and no murmurs and no lower extremity edema ABDOMEN:abdomen soft, non-tender and normal bowel sounds Musculoskeletal:no cyanosis of digits and no clubbing  NEURO: alert & oriented x 3 with fluent speech, no focal motor/sensory deficits  LABORATORY DATA:  I have reviewed the data as listed CBC Latest Ref Rng & Units 02/10/2018 01/12/2018 12/22/2017  WBC 4.0 - 10.5 K/uL 8.9 7.1 8.2  Hemoglobin 12.0 - 15.0 g/dL 11.6(L) 11.0(L) 11.4(L)  Hematocrit 36.0 - 46.0 % 36.7 34.4(L) 34.6(L)  Platelets 150 - 400 K/uL 448(H) 366 495(H)     CMP Latest Ref Rng & Units 02/10/2018 01/12/2018 12/22/2017  Glucose 70 - 99 mg/dL 99 128(H) 79  BUN 6 - 20 mg/dL <4(L) 4(L) 4(L)  Creatinine 0.44 - 1.00 mg/dL 0.66 0.66 0.61  Sodium 135 - 145 mmol/L 140 139 140    Potassium 3.5 - 5.1 mmol/L 3.8 4.2 3.8  Chloride 98 - 111 mmol/L 104 102 101  CO2 22 - 32 mmol/L 27 27 32  Calcium 8.9 - 10.3 mg/dL 8.6(L) 8.5(L) 8.8(L)  Total Protein 6.5 - 8.1 g/dL 6.7 6.3(L) 6.8  Total Bilirubin 0.3 - 1.2 mg/dL <0.2(L) <0.2(L) 0.3  Alkaline Phos 38 - 126 U/L 163(H) 148(H) 316(H)  AST 15 - 41 U/L 16 17 155(H)  ALT 0 - 44 U/L 14 13 199(H)      RADIOGRAPHIC STUDIES: I have personally reviewed the radiological images as listed and agreed with the findings in the report. No results found.   ASSESSMENT & PLAN:  Yvonne Lang is a 52 y.o. female with    1. 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  -S/p radiation and currently on first line chemo with carboplatin, paclitaxel, Atezolizumab and bevacizumab every 3 weeks since 11/08/17. Plan to complete 6 cycles and start maintenance therapy.  -Restaging scan after 3 cycle chemo showed good response to treatment. -She is tolerating well, but has bone pain from Udenyca injections. She has Claritin for this.  -She had mild wheezing on her exam today, I refilled her albuterol today and will give nebulizer in infusion room.   -Labs reviewed, Hg at 11.6, PLT at 448, Urine protein negative. CMP is still pending. Overall adequate to proceed with treatment today  -Will rescan after 6 cycles  -She will return in 3 weeks for next cycle treatment   2. Frontal Headaches  -10/14/17 Brain MRI shows no metastatic disease but shows C3-C4 disc and endplate degeneration.  -Tramadol does not effect her headaches but she is taking Fioricet with some relief. -Overall stable -Continue f/u with Dr. Mickeal Skinner  3. Anxiety and Depression -She was previously prescribed Xanax and Lorazapam on 10/04/17  -Manageable   4. Chronic back pain  -pain stable  -On tramadol 50 mg q6hours    5. Low appetite, Nausea  -Continue mirtazapine and antiemetics, appetite has improved, nausea is manageable. -overall  improved     Plan: -I refilled her Albuterol today and will give nebulizer in infusion room today  -Labs reviewed, and Overall adequate to proceed with treatment today, cycle 5  -Labs, flush, f/u and treatment in 3 weeks    No problem-specific Assessment & Plan notes found for this encounter.   No orders of the defined types were placed in this encounter.  All questions were answered. The patient knows to call the clinic with any problems, questions or concerns. No barriers to learning was detected. I spent 20 minutes counseling the patient face to face. The total time spent in the appointment was 25 minutes and more than 50% was on counseling and review of test results     Truitt Merle, MD 02/10/2018   I, Joslyn Devon, am acting as scribe for Truitt Merle, MD.   I have reviewed the above documentation for accuracy and completeness, and I agree with the above.

## 2018-02-10 NOTE — Progress Notes (Signed)
PT given neb tx per MD Burr Medico.  Tolerated well.  Reports improvement in breathing.  Pt presents with dry cough with mild audible wheezing.  Denies SOB or tightness in chest/throat.  About 1.5 hours into taxol tx.  PA Lucianne Lei came ot assess, second neb tx given.  Reported improvement in cough and wheezing gone.  Did not stop taxol infusion per PA Van.

## 2018-02-10 NOTE — Progress Notes (Signed)
Opened in error. Seen by Dr. Burr Medico.

## 2018-02-11 ENCOUNTER — Encounter: Payer: Self-pay | Admitting: Hematology

## 2018-02-12 ENCOUNTER — Other Ambulatory Visit: Payer: Self-pay | Admitting: Hematology

## 2018-02-13 ENCOUNTER — Inpatient Hospital Stay: Payer: No Typology Code available for payment source

## 2018-02-13 ENCOUNTER — Encounter: Payer: Self-pay | Admitting: Internal Medicine

## 2018-02-13 ENCOUNTER — Inpatient Hospital Stay (HOSPITAL_BASED_OUTPATIENT_CLINIC_OR_DEPARTMENT_OTHER): Payer: No Typology Code available for payment source | Admitting: Internal Medicine

## 2018-02-13 ENCOUNTER — Ambulatory Visit: Payer: 59

## 2018-02-13 ENCOUNTER — Telehealth: Payer: Self-pay | Admitting: Hematology

## 2018-02-13 DIAGNOSIS — C7989 Secondary malignant neoplasm of other specified sites: Secondary | ICD-10-CM | POA: Diagnosis not present

## 2018-02-13 DIAGNOSIS — R519 Headache, unspecified: Secondary | ICD-10-CM | POA: Insufficient documentation

## 2018-02-13 DIAGNOSIS — C3431 Malignant neoplasm of lower lobe, right bronchus or lung: Secondary | ICD-10-CM

## 2018-02-13 DIAGNOSIS — R51 Headache: Secondary | ICD-10-CM | POA: Diagnosis not present

## 2018-02-13 DIAGNOSIS — Z5189 Encounter for other specified aftercare: Secondary | ICD-10-CM | POA: Diagnosis not present

## 2018-02-13 LAB — TSH: TSH: 0.847 u[IU]/mL (ref 0.308–3.960)

## 2018-02-13 MED ORDER — PEGFILGRASTIM-CBQV 6 MG/0.6ML ~~LOC~~ SOSY
PREFILLED_SYRINGE | SUBCUTANEOUS | Status: AC
  Filled 2018-02-13: qty 0.6

## 2018-02-13 MED ORDER — PEGFILGRASTIM-CBQV 6 MG/0.6ML ~~LOC~~ SOSY
6.0000 mg | PREFILLED_SYRINGE | Freq: Once | SUBCUTANEOUS | Status: AC
  Administered 2018-02-13: 6 mg via SUBCUTANEOUS

## 2018-02-13 NOTE — Telephone Encounter (Signed)
Per 11/22 no los

## 2018-02-13 NOTE — Progress Notes (Signed)
[facility] [place], [place] [address] [phone]   New Patient Evaluation  Date of Service: 02/13/18 Patient Name: Yvonne Lang Patient MRN: [id number] Patient DOB: 09-Oct-1965 Provider: Ventura Sellers, MD  Identifying Statement:  Yvonne Lang is a 52 y.o. female with headaches, who presents for initial consultation and evaluation regarding cancer associated neurologic deficits.    Referring Provider: Redmond School, MD [address] [place],  [address]  Primary Cancer:  Oncologic History: Oncology History   Cancer Staging Metastatic non-small cell lung cancer [facility]) Staging form: Lung, AJCC 8th Edition - Clinical stage from 10/17/2017: Stage IV (cT3, cN1, cM1b) - Signed by Truitt Merle, MD on 10/17/2017       Metastatic non-small cell lung cancer ([place])   09/20/2017 Imaging    US Breast Left 09/20/17  IMPRESSION Indeterminte palpable mass measuring 3.2x1.7x2.8 cm  inferior medial to the inframammary fold of the left breast.      09/20/2017 Initial Biopsy    Diagnosis 09/20/17 Soft Tissue Needle Core Biopsy, inferior medial to IMF - ADENOCARCINOMA. - SEE MICROSCOPIC DESCRIPTION.  Microscopic Comment Immunohistochemistry will be performed and reported as an addendum. (JDP:ah 09/23/17) ADDENDUM: Immunohistochemistry shows the tumor is strongly positive with cytokeratin AE1/AE3, cytokeratin 7 and shows moderate weak positivity with CDX-2. The tumor is negative with estrogen receptor, progesterone receptor, GCDFP, GATA-3, Napsin A, TTF-1, WT-1 and cytokeratin 20. The immunophenotype is consistent with metastatic carcinoma. The combination of CDX-2 and cytokeratin 7 positivity raises the possibility of an upper gastrointestinal primary. Clinical correlation is essential.    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 Silverio Decamp 10/10/17  IMPRESSION - Normal esophagus. - Z-line regular, 35 cm from the incisors. - Gastric bypass with a normal-sized pouch and intact staple line. Gastrojejunal anastomosis characterized by healthy appearing mucosa. - No specimens collected.    10/12/2017 Imaging    CT CAP WO Contrast 10/12/17  IMPRESSION: 7 cm central right lung mass involving the right hilum, with postobstructive collapse of the right middle lobe, highly suspicious for primary bronchogenic carcinoma. 5 cm masslike opacity in the superior segment of the right lower lobe may represent carcinoma or postobstructive pneumonitis. 3.3 cm soft tissue mass in the lower anterior chest wall soft tissues, suspicious for metastatic disease. No evidence of abdominal or pelvic metastatic disease.     10/14/2017 Imaging    MRI Brain 10/14/17  IMPRESSION: 1. No metastatic disease or acute intracranial abnormality. Normal MRI appearance of the brain. 2. 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      10/17/2017 Cancer Staging    Staging form: Lung, AJCC 8th Edition - Clinical stage from 10/17/2017: Stage IV (cT3, cN1, cM1b) - Signed by Truitt Merle, MD on 10/17/2017    10/24/2017 - 11/04/2017 Radiation Therapy     The Right lung mass was treated to 30 Gy in 10 fractions of 3 Gy    11/08/2017 -  Chemotherapy     first line chemo with carboplatin, paclitaxel, Atezolizumab and bevacizumab every 3    01/10/2018 Imaging    01/10/2018 CT CA IMPRESSION: 1. Response to therapy of central right lower lobe lung mass. Re-expansion of the right middle lobe with significantly improved right lower lobe postobstructive pneumonitis. Residual geographic airspace and ground-glass opacity within the medial right lung, primarily favored to be radiation induced.  2. Near complete resolution of presternal soft tissue mass. 3. New right adrenal nodule since 10/11/2017, suspicious for metastatic  disease. 4. Right-sided Port-A-Cath with probable nonocclusive thrombus along the catheter within the right brachiocephalic vein. 5. Age advanced coronary artery atherosclerosis. Recommend assessment of coronary risk factors and consideration of medical therapy. 6. Aortic atherosclerosis (ICD10-I70.0) and emphysema (ICD10-J43.9).     History of Present Illness: The patient's records from the referring physician were obtained and reviewed and the patient interviewed to confirm this HPI.  Yvonne Lang presents today to discuss her recent headache syndrome.  She describes bifrontal pain 8/10, without radiation, without nausea or photophobia, lasting for hours.  Headaches were occurring daily "for at least 2 months straight", although at this time they have abated.  She takes Tramadol and/or Fioricet at least 4x per day, mainly for chronic back pain.  Tramadol has been part of her regimen for almost 20 years.  She does not describe cancer related pain.  Denies focal neurologic deficits or seizures.    Medications: Current Outpatient Medications on File Prior to Visit  Medication Sig Dispense Refill  . albuterol (PROVENTIL HFA) 108 (90 Base) MCG/ACT inhaler Inhale 2 puffs into the lungs 2 (two) times daily. 1 Inhaler 2  . albuterol (PROVENTIL) 4 MG tablet Take 4 mg by mouth 2 (two) times daily.     Marland Kitchen ALPRAZolam (XANAX) 0.25 MG tablet Take 1 tablet (0.25 mg total) by mouth at bedtime as needed for anxiety. 30 tablet 0  . aspirin 81 MG tablet Take 81 mg by mouth daily.    . butalbital-acetaminophen-caffeine (FIORICET, ESGIC) 50-325-40 MG tablet Take 1-2 tablets by mouth every 6 (six) hours as needed for headache. 60 tablet 1  . CALCIUM PO Take by mouth daily.    Marland Kitchen dicyclomine (BENTYL) 20 MG tablet Take 1 tablet (20 mg total) by mouth every 6 (six) hours. 60 tablet 1  . DULoxetine (CYMBALTA) 60 MG capsule Take 60 mg by mouth daily.    Marland Kitchen gabapentin (NEURONTIN) 300 MG capsule TAKE 1 CAPSULE BY  MOUTH 4 TIMES DAILY  4  . lidocaine-prilocaine (EMLA) cream Apply 1 application topically as needed. Apply to port one hour prior to port access/chemo 30 g 2  . magic mouthwash w/lidocaine SOLN Take 5 mLs by mouth 3 (three) times daily. Swish and spit 3 x daily 240 mL 0  . mirtazapine (REMERON) 7.5 MG tablet Take 1 tablet (7.5 mg total) by mouth at bedtime. 30 tablet 1  . Multiple Vitamin (MULTIVITAMIN) tablet Take 1 tablet by mouth daily.    Marland Kitchen omeprazole (PRILOSEC) 20 MG capsule Take 1 capsule (20 mg total) by mouth 2 (two) times daily before a meal. 60 capsule 0  . ondansetron (ZOFRAN) 8 MG tablet TAKE 1 TABLET BY MOUTH EVERY 8 HOURS AS NEEDED FOR NAUSEA AND VOMITING 30 tablet 1  . potassium chloride SA (K-DUR,KLOR-CON) 20 MEQ tablet Take 1 tablet (20 mEq total) by mouth daily. 60 tablet 0  . prochlorperazine (COMPAZINE) 10 MG tablet Take 1 tablet (10 mg total) by mouth every 6 (six) hours as needed for nausea or vomiting. 30 tablet 0  . prochlorperazine (COMPAZINE) 10 MG tablet TAKE 1 TABLET BY MOUTH EVERY 6 HOURS AS NEEDED FOR NAUSEA AND VOMITING 30 tablet 0  . RaNITidine HCl (ZANTAC PO) Take by mouth 3 (three) times daily.    Marland Kitchen rOPINIRole (REQUIP) 0.5 MG tablet TAKE 1 TABLET BY MOUTH AT BEDTIME FOR 30 DAYS  11  . sucralfate (  CARAFATE) 1 g tablet TAKE 1 TABLET(1 GRAM) BY MOUTH FOUR TIMES DAILY 120 tablet 0  . traMADol (ULTRAM) 50 MG tablet Take 1 tablet (50 mg total) by mouth every 6 (six) hours as needed. 90 tablet 0  . TRELEGY ELLIPTA 100-62.5-25 MCG/INH AEPB INHALE 1 PUFF INTO THE LUNGS EVERY DAY  5  . UNABLE TO FIND Inhale into the lungs. Med Name: Trilogy as needed     No current facility-administered medications on file prior to visit.     Allergies:  Allergies  Allergen Reactions  . Codeine Other (See Comments)    Migraine headache   Past Medical History:  Past Medical History:  Diagnosis Date  . Anxiety   . Bronchitis   . COPD (chronic obstructive pulmonary disease) ([place])    . Depression   . GERD (gastroesophageal reflux disease)   . H/O gastric bypass    1999  . Irritable bowel syndrome   . Lung cancer [facility])    Past Surgical History:  Past Surgical History:  Procedure Laterality Date  . BACK SURGERY    . CHOLECYSTECTOMY    . COLON SURGERY     for a kink in her colon and 12 inchs removed  . COLONOSCOPY N/A 12/13/2013   Procedure: COLONOSCOPY;  Surgeon: Rogene Houston, MD;  Location: [facility];  Service: Endoscopy;  Laterality: N/A;  200  . ESOPHAGOGASTRODUODENOSCOPY N/A 12/13/2013   Procedure: ESOPHAGOGASTRODUODENOSCOPY (EGD);  Surgeon: Rogene Houston, MD;  Location: [facility];  Service: Endoscopy;  Laterality: N/A;  . Gastric Bypas  2000  . IR IMAGING GUIDED PORT INSERTION  11/14/2017   Social History:  Social History   Socioeconomic History  . Marital status: Single    Spouse name: Not on file  . Number of children: Not on file  . Years of education: Not on file  . Highest education level: Not on file  Occupational History  . Not on file  Social Needs  . Financial resource strain: Not on file  . Food insecurity:    Worry: Not on file    Inability: Not on file  . Transportation needs:    Medical: Not on file    Non-medical: Not on file  Tobacco Use  . Smoking status: Current Every Day Smoker    Packs/day: 1.00    Years: 40.00    Pack years: 40.00    Types: Cigarettes  . Smokeless tobacco: Never Used  . Tobacco comment: Pack a day x 30 yrs. Trying to stop smoking using nicotine gum.   Substance and Sexual Activity  . Alcohol use: No  . Drug use: No  . Sexual activity: Not Currently    Birth control/protection: Post-menopausal  Lifestyle  . Physical activity:    Days per week: Not on file    Minutes per session: Not on file  . Stress: Not on file  Relationships  . Social connections:    Talks on phone: Not on file    Gets together: Not on file    Attends religious service: Not on file    Active member of club or  organization: Not on file    Attends meetings of clubs or organizations: Not on file    Relationship status: Not on file  . Intimate partner violence:    Fear of current or ex partner: Not on file    Emotionally abused: Not on file    Physically abused: Not on file    Forced sexual activity: Not  on file  Other Topics Concern  . Not on file  Social History Narrative  . Not on file   Family History:  Family History  Problem Relation Age of Onset  . Other Mother        has a pacemaker; rectal prolapse; rods in legs; lung fibrosis   . Other Father        heart gave out  . Hypertension Sister   . Bronchitis Sister   . Heart attack Paternal Grandmother   . COPD Maternal Grandmother   . Emphysema Maternal Grandmother   . Alcoholism Maternal Grandfather   . Cancer Sister        rare skin cancer  . Cancer Maternal Uncle 7       colon cancer   . Colon cancer Maternal Uncle     Review of Systems: Constitutional: Denies fevers, chills or abnormal weight loss Eyes: Denies blurriness of vision Ears, nose, mouth, throat, and face: Denies mucositis or sore throat Respiratory: Denies cough, dyspnea or wheezes Cardiovascular: Denies palpitation, chest discomfort or lower extremity swelling Gastrointestinal:  Denies nausea, constipation, diarrhea GU: Denies dysuria or incontinence Skin: Denies abnormal skin rashes Neurological: Per HPI Musculoskeletal: chronic back pain Behavioral/Psych: Denies anxiety, disturbance in thought content, and mood instability   Physical Exam: Vitals:   02/13/18 1225  BP: 116/82  Pulse: (!) 101  Resp: 17  Temp: 97.7 F (36.5 C)  SpO2: 100%   KPS: 90. General: Alert, cooperative, pleasant, in no acute distress Head: Craniotomy scar noted, dry and intact. EENT: No conjunctival injection or scleral icterus. Oral mucosa moist Lungs: Resp effort normal Cardiac: Regular rate and rhythm Abdomen: Soft, non-distended abdomen Skin: No rashes cyanosis or  petechiae. Extremities: No clubbing or edema  Neurologic Exam: Mental Status: Awake, alert, attentive to examiner. Oriented to self and environment. Language is fluent with intact comprehension.  Cranial Nerves: Visual acuity is grossly normal. Visual fields are full. Extra-ocular movements intact. No ptosis. Face is symmetric, tongue midline. Motor: Tone and bulk are normal. Power is full in both arms and legs. Reflexes are symmetric, no pathologic reflexes present. Intact finger to nose bilaterally Sensory: Intact to light touch and temperature Gait: Normal and tandem gait is normal.   Labs: I have reviewed the data as listed    Component Value Date/Time   NA 140 02/10/2018 0808   K 3.8 02/10/2018 0808   CL 104 02/10/2018 0808   CO2 27 02/10/2018 0808   GLUCOSE 99 02/10/2018 0808   BUN <4 (L) 02/10/2018 0808   CREATININE 0.66 02/10/2018 0808   CREATININE 0.59 10/25/2013 1617   CALCIUM 8.6 (L) 02/10/2018 0808   PROT 6.7 02/10/2018 0808   ALBUMIN 3.0 (L) 02/10/2018 0808   AST 16 02/10/2018 0808   ALT 14 02/10/2018 0808   ALKPHOS 163 (H) 02/10/2018 0808   BILITOT <0.2 (L) 02/10/2018 0808   GFRNONAA >60 02/10/2018 0808   GFRAA >60 02/10/2018 0808   Lab Results  Component Value Date   WBC 8.9 02/10/2018   NEUTROABS 6.1 02/10/2018   HGB 11.6 (L) 02/10/2018   HCT 36.7 02/10/2018   MCV 92.4 02/10/2018   PLT 448 (H) 02/10/2018     Assessment/Plan 1. Daily headache  Ms. Ramaswamy' headache syndrome is likely secondary to or exacerbated by medication overuse, specifically with Tramadol and Fioricet.  Because she is currently/actively undergoing chemotherapy treatments we ask that she continue her current pain regimen.  Especially given improved control of her symptoms, this is  not the right time to attempt weaning of analgesia.  We recommended she call us after chemotherapy was complete, or if headaches return clinically.  We discussed our likely recommendation which would be  gradual, structured draw-down of analgesia.  We spent ten additional minutes teaching regarding the natural history, biology, and historical experience in the treatment of neurologic complications of cancer. We also provided teaching sheets for the patient to take home as an additional resource.  We appreciate the opportunity to participate in the care of Jameka Ivie.   All questions were answered. The patient knows to call the clinic with any problems, questions or concerns. No barriers to learning were detected.  The total time spent in the encounter was 25 minutes and more than 50% was on counseling and review of test results   Ventura Sellers, MD Medical Director of Neuro-Oncology [facility] at [place] 02/13/18 1:33 PM

## 2018-02-14 ENCOUNTER — Telehealth: Payer: Self-pay

## 2018-02-14 NOTE — Telephone Encounter (Signed)
Per 11/25 no los

## 2018-02-14 NOTE — Progress Notes (Signed)
This provider was asked to see Ms. Brune in the infusion room while she was receiving Tecentriq and Avastin.  She has been premedicated with Emend, dexamethasone, Benadryl, Pepcid, and Aloxi.  She is also been given an albuterol nebulizer treatment.  She continued to have a nonproductive cough.  She was given an additional albuterol nebulizer.  Her exam showed that her lungs were clear to auscultation bilaterally without wheezes rales or rhonchi.  She was not tachycardic.  She was also given some mints in an attempt to relieve her cough.  It was determined that there were no cough syrups available through the pharmacy.  The patient's cough improved.  She was able to complete her chemotherapy without any other issues of concern.  Sandi Mealy, MHS, PA-C Physician Assistant

## 2018-02-21 ENCOUNTER — Encounter: Payer: Self-pay | Admitting: Hematology

## 2018-02-25 ENCOUNTER — Other Ambulatory Visit: Payer: Self-pay | Admitting: Hematology

## 2018-02-27 NOTE — Progress Notes (Signed)
[place]   [phone] [phone]   Clinic Follow up Note   Patient Care Team: Redmond School, MD as PCP - General (Internal Medicine) 03/02/2018  CHIEF COMPLAINT: F/u on lung cancer   SUMMARY OF ONCOLOGIC HISTORY: Oncology History   Cancer Staging Metastatic non-small cell lung cancer [facility]) Staging form: Lung, AJCC 8th Edition - Clinical stage from 10/17/2017: Stage IV (cT3, cN1, cM1b) - Signed by Truitt Merle, MD on 10/17/2017       Metastatic non-small cell lung cancer ([place])   09/20/2017 Imaging    US Breast Left 09/20/17  IMPRESSION Indeterminte palpable mass measuring 3.2x1.7x2.8 cm  inferior medial to the inframammary fold of the left breast.      09/20/2017 Initial Biopsy    Diagnosis 09/20/17 Soft Tissue Needle Core Biopsy, inferior medial to IMF - ADENOCARCINOMA. - SEE MICROSCOPIC DESCRIPTION.  Microscopic Comment Immunohistochemistry will be performed and reported as an addendum. (JDP:ah 09/23/17) ADDENDUM: Immunohistochemistry shows the tumor is strongly positive with cytokeratin AE1/AE3, cytokeratin 7 and shows moderate weak positivity with CDX-2. The tumor is negative with estrogen receptor, progesterone receptor, GCDFP, GATA-3, Napsin A, TTF-1, WT-1 and cytokeratin 20. The immunophenotype is consistent with metastatic carcinoma. The combination of CDX-2 and cytokeratin 7 positivity raises the possibility of an upper gastrointestinal primary. Clinical correlation is essential.    10/06/2017 Initial Diagnosis    Metastatic adenocarcinoma involving soft tissue with unknown primary site [facility])    10/10/2017 Procedure    Upper Endoscopy by Dr. Silverio Decamp 10/10/17  IMPRESSION - Normal esophagus. - Z-line regular, 35 cm from the incisors. - Gastric bypass with a normal-sized pouch and intact staple line. Gastrojejunal anastomosis characterized by healthy appearing mucosa. - No specimens collected.    10/12/2017 Imaging    CT CAP WO  Contrast 10/12/17  IMPRESSION: 7 cm central right lung mass involving the right hilum, with postobstructive collapse of the right middle lobe, highly suspicious for primary bronchogenic carcinoma. 5 cm masslike opacity in the superior segment of the right lower lobe may represent carcinoma or postobstructive pneumonitis. 3.3 cm soft tissue mass in the lower anterior chest wall soft tissues, suspicious for metastatic disease. No evidence of abdominal or pelvic metastatic disease.     10/14/2017 Imaging    MRI Brain 10/14/17  IMPRESSION: 1. No metastatic disease or acute intracranial abnormality. Normal MRI appearance of the brain. 2. 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      10/17/2017 Cancer Staging    Staging form: Lung, AJCC 8th Edition - Clinical stage from 10/17/2017: Stage IV (cT3, cN1, cM1b) - Signed by Truitt Merle, MD on 10/17/2017    10/24/2017 - 11/04/2017 Radiation Therapy     The Right lung mass was treated to 30 Gy in 10 fractions of 3 Gy    11/08/2017 -  Chemotherapy     first line chemo with carboplatin, paclitaxel, Atezolizumab and bevacizumab every 3    01/10/2018 Imaging    01/10/2018 CT CA IMPRESSION: 1. Response to therapy of central right lower lobe lung mass. Re-expansion of the right middle lobe with significantly improved right lower lobe postobstructive pneumonitis. Residual geographic airspace and ground-glass opacity within the medial right lung, primarily favored to be radiation induced. 2. Near complete resolution of presternal soft tissue mass. 3. New right adrenal nodule since 10/11/2017, suspicious for metastatic disease. 4. Right-sided Port-A-Cath with probable nonocclusive thrombus along the catheter within the right brachiocephalic vein. 5. Age advanced coronary artery atherosclerosis. Recommend assessment of coronary  risk factors and consideration of medical therapy. 6. Aortic atherosclerosis (ICD10-I70.0) and emphysema  (ICD10-J43.9).     CURRENT THERAPY first line chemo with carboplatin, paclitaxel, Atezolizumab and bevacizumab every 3 weeks starting 11/08/17    INTERVAL HISTORY: Yvonne Lang is a 52 y.o. female who is here for follow-up and cycle 6 chemo. She was evaluated by Dr. Mickeal Skinner for headaches, which were noted by him to be likely from overuse of Tramadol and Fioricet. Today, she is here alone. She is doing well but states that multiple family members have been sick, including her mother. She is currently taking care of her mother. Her sister has pancreatitis. She noticed constant pain on her left flank for 2 weeks now. She says that the pain is bad but she is trying to stay patient until the next CT scan. She is still on Tramadol and Fioricet for the pain. She denies history of kidney stones.    Pertinent positives and negatives of review of systems are listed and detailed within the above HPI.  REVIEW OF SYSTEMS:   Constitutional: Denies fevers, chills or abnormal weight loss Eyes: Denies blurriness of vision Ears, nose, mouth, throat, and face: Denies mucositis or sore throat Respiratory: Denies cough, dyspnea or wheezes Cardiovascular: Denies palpitation, chest discomfort or lower extremity swelling Gastrointestinal:  Denies nausea, heartburn or change in bowel habits GU: (+) left flank pain, constant Skin: Denies abnormal skin rashes Lymphatics: Denies new lymphadenopathy or easy bruising Neurological:Denies numbness, tingling or new weaknesses Behavioral/Psych: Mood is stable, no new changes  All other systems were reviewed with the patient and are negative.  MEDICAL HISTORY:  Past Medical History:  Diagnosis Date  . Anxiety   . Bronchitis   . COPD (chronic obstructive pulmonary disease) ([place])   . Depression   . GERD (gastroesophageal reflux disease)   . H/O gastric bypass    1999  . Irritable bowel syndrome   . Lung cancer [facility])     SURGICAL HISTORY: Past Surgical  History:  Procedure Laterality Date  . BACK SURGERY    . CHOLECYSTECTOMY    . COLON SURGERY     for a kink in her colon and 12 inchs removed  . COLONOSCOPY N/A 12/13/2013   Procedure: COLONOSCOPY;  Surgeon: Rogene Houston, MD;  Location: [facility];  Service: Endoscopy;  Laterality: N/A;  200  . ESOPHAGOGASTRODUODENOSCOPY N/A 12/13/2013   Procedure: ESOPHAGOGASTRODUODENOSCOPY (EGD);  Surgeon: Rogene Houston, MD;  Location: [facility];  Service: Endoscopy;  Laterality: N/A;  . Gastric Bypas  2000  . IR IMAGING GUIDED PORT INSERTION  11/14/2017    I have reviewed the social history and family history with the patient and they are unchanged from previous note.  ALLERGIES:  is allergic to codeine.  MEDICATIONS:  Current Outpatient Medications  Medication Sig Dispense Refill  . albuterol (PROVENTIL HFA) 108 (90 Base) MCG/ACT inhaler Inhale 2 puffs into the lungs 2 (two) times daily. 1 Inhaler 2  . albuterol (PROVENTIL) 4 MG tablet Take 4 mg by mouth 2 (two) times daily.     Marland Kitchen ALPRAZolam (XANAX) 0.25 MG tablet Take 1 tablet (0.25 mg total) by mouth at bedtime as needed for anxiety. 30 tablet 0  . aspirin 81 MG tablet Take 81 mg by mouth daily.    . butalbital-acetaminophen-caffeine (FIORICET, ESGIC) 50-325-40 MG tablet Take 1-2 tablets by mouth every 6 (six) hours as needed for headache. 60 tablet 1  . CALCIUM PO Take by mouth daily.    Marland Kitchen  dicyclomine (BENTYL) 20 MG tablet Take 1 tablet (20 mg total) by mouth every 6 (six) hours. 60 tablet 1  . DULoxetine (CYMBALTA) 60 MG capsule Take 60 mg by mouth daily.    Marland Kitchen gabapentin (NEURONTIN) 300 MG capsule TAKE 1 CAPSULE BY MOUTH 4 TIMES DAILY  4  . lidocaine-prilocaine (EMLA) cream Apply 1 application topically as needed. Apply to port one hour prior to port access/chemo 30 g 2  . magic mouthwash w/lidocaine SOLN Take 5 mLs by mouth 3 (three) times daily. Swish and spit 3 x daily 240 mL 0  . mirtazapine (REMERON) 7.5 MG tablet Take 1 tablet  (7.5 mg total) by mouth at bedtime. 30 tablet 1  . Multiple Vitamin (MULTIVITAMIN) tablet Take 1 tablet by mouth daily.    Marland Kitchen omeprazole (PRILOSEC) 20 MG capsule Take 1 capsule (20 mg total) by mouth 2 (two) times daily before a meal. 60 capsule 0  . ondansetron (ZOFRAN) 8 MG tablet TAKE 1 TABLET BY MOUTH EVERY 8 HOURS AS NEEDED FOR NAUSEA AND VOMITING 30 tablet 1  . potassium chloride SA (K-DUR,KLOR-CON) 20 MEQ tablet Take 1 tablet (20 mEq total) by mouth daily. 60 tablet 0  . prochlorperazine (COMPAZINE) 10 MG tablet Take 1 tablet (10 mg total) by mouth every 6 (six) hours as needed for nausea or vomiting. 30 tablet 0  . prochlorperazine (COMPAZINE) 10 MG tablet TAKE 1 TABLET BY MOUTH EVERY 6 HOURS AS NEEDED FOR NAUSEA AND VOMITING 30 tablet 0  . RaNITidine HCl (ZANTAC PO) Take by mouth 3 (three) times daily.    Marland Kitchen rOPINIRole (REQUIP) 0.5 MG tablet TAKE 1 TABLET BY MOUTH AT BEDTIME FOR 30 DAYS  11  . sucralfate (CARAFATE) 1 g tablet TAKE 1 TABLET(1 GRAM) BY MOUTH FOUR TIMES DAILY 120 tablet 0  . traMADol (ULTRAM) 50 MG tablet Take 1 tablet (50 mg total) by mouth every 6 (six) hours as needed. 90 tablet 0  . TRELEGY ELLIPTA 100-62.5-25 MCG/INH AEPB INHALE 1 PUFF INTO THE LUNGS EVERY DAY  5  . UNABLE TO FIND Inhale into the lungs. Med Name: Trilogy as needed     No current facility-administered medications for this visit.    Facility-Administered Medications Ordered in Other Visits  Medication Dose Route Frequency Provider Last Rate Last Dose  . sodium chloride flush (NS) 0.9 % injection 10 mL  10 mL Intracatheter PRN Truitt Merle, MD   10 mL at 03/02/18 1553    PHYSICAL EXAMINATION: ECOG PERFORMANCE STATUS: 1 - Symptomatic but completely ambulatory  Vitals:   03/02/18 0811  BP: (!) 132/96  Pulse: (!) 122  Resp: 17  Temp: 98.1 F (36.7 C)  SpO2: 96%   Filed Weights   03/02/18 0811  Weight: 188 lb 6.4 oz (85.5 kg)    GENERAL:alert, no distress and comfortable SKIN: skin color,  texture, turgor are normal, no rashes or significant lesions EYES: normal, Conjunctiva are pink and non-injected, sclera clear OROPHARYNX:no exudate, no erythema and lips, buccal mucosa, and tongue normal  NECK: supple, thyroid normal size, non-tender, without nodularity LYMPH:  no palpable lymphadenopathy in the cervical, axillary or inguinal LUNGS: clear to percussion with normal breathing effort (+) bilateral wheezing (+) anterior chest wall mass cannot be palpated  HEART: regular rate & rhythm and no murmurs and no lower extremity edema ABDOMEN:abdomen soft, and normal bowel sounds (+) LUQ and RUQ mild tenderness Musculoskeletal:no cyanosis of digits and no clubbing  NEURO: alert & oriented x 3 with fluent speech, no  focal motor/sensory deficits  LABORATORY DATA:  I have reviewed the data as listed CBC Latest Ref Rng & Units 03/02/2018 02/10/2018 01/12/2018  WBC 4.0 - 10.5 K/uL 7.7 8.9 7.1  Hemoglobin 12.0 - 15.0 g/dL 10.8(L) 11.6(L) 11.0(L)  Hematocrit 36.0 - 46.0 % 34.1(L) 36.7 34.4(L)  Platelets 150 - 400 K/uL 464(H) 448(H) 366     CMP Latest Ref Rng & Units 03/02/2018 02/10/2018 01/12/2018  Glucose 70 - 99 mg/dL 102(H) 99 128(H)  BUN 6 - 20 mg/dL 5(L) <4(L) 4(L)  Creatinine 0.44 - 1.00 mg/dL 0.62 0.66 0.66  Sodium 135 - 145 mmol/L 143 140 139  Potassium 3.5 - 5.1 mmol/L 4.0 3.8 4.2  Chloride 98 - 111 mmol/L 104 104 102  CO2 22 - 32 mmol/L 29 27 27   Calcium 8.9 - 10.3 mg/dL 9.0 8.6(L) 8.5(L)  Total Protein 6.5 - 8.1 g/dL 6.9 6.7 6.3(L)  Total Bilirubin 0.3 - 1.2 mg/dL 0.3 <0.2(L) <0.2(L)  Alkaline Phos 38 - 126 U/L 304(H) 163(H) 148(H)  AST 15 - 41 U/L 38 16 17  ALT 0 - 44 U/L 37 14 13      RADIOGRAPHIC STUDIES: I have personally reviewed the radiological images as listed and agreed with the findings in the report. No results found.   ASSESSMENT & PLAN:  Yvonne Lang is a 52 y.o. female with history of  1. 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  -Diagnosed in 09/2017. Treated with chemo and radiation. Currently on first line carboplatin, paclitaxel, Atezolizumab and bevacizumab every 3 weeks. Tolerating well. -Restaging CT scan after 3 cycle chemo showed excellent response to treatment, presternal soft tissue mass has resolved, primary mass has significantly reduced in size, new 9 mm nodule in the right adrenal gland, indeterminate.  -Labs reviewed, CBC showed Hg 10.8 PLT 464K.  CMP unremarkable except low albumin and elevated alkaline phosphatase.  Adequate for treatment, will proceed cycle 6 chemo today. -She has developed significant left flank pain, plan to repeat a CT scan next week to evaluate her disease status. -If no disease progression, plan to change her treatment to maintenance atezolizumab and Avastin every 3 weeks.  2. Frontal Headaches -No evidence of brain metastasis. - Was evaluated by Dr. Mickeal Skinner, who thinks it's from overuse of Tramadol and Fioricet. -f/u with Dr. Mickeal Skinner  3. Anxiety and Depression -Currently on Xanax and Cymbalta. Continue.  4. Chronic back pain  - Currently on Tramadol 50 mg q6Hrs. Continue.  5. Low appetite, Nausea  -Currently on mirtazapine, omeprazole, ondansetron, prochlorperazine, and sucralfate. Continue. -overall improved   6. Goal of care discussion  -We again discussed the incurable nature of her cancer, and the overall poor prognosis, especially if she does not have good response to chemotherapy or progress on chemo -The patient understands the goal of care is palliative. -she is full code now      Plan  -Repeat CT CAP w/ contrast next week for restaging and work up for new left frank pain  -Labs reviewed, good to proceed with chemo today. -f/u in 3 weeks or earlier if needed according to CT results   No problem-specific Assessment & Plan notes found for this encounter.   Orders Placed This Encounter  Procedures  . CT Abdomen Pelvis W Contrast    Standing  Status:   Future    Standing Expiration Date:   03/02/2019    Order Specific Question:   If indicated for the ordered procedure, I authorize the administration  of contrast media per Radiology protocol    Answer:   Yes    Order Specific Question:   Is patient pregnant?    Answer:   No    Order Specific Question:   Preferred imaging location?    Answer:   [facility]    Order Specific Question:   Is Oral Contrast requested for this exam?    Answer:   Yes, Per Radiology protocol    Order Specific Question:   Radiology Contrast Protocol - do NOT remove file path    Answer:   \\[email or web address]  . CT Chest W Contrast    Standing Status:   Future    Standing Expiration Date:   03/02/2019    Order Specific Question:   If indicated for the ordered procedure, I authorize the administration of contrast media per Radiology protocol    Answer:   Yes    Order Specific Question:   Is patient pregnant?    Answer:   No    Order Specific Question:   Preferred imaging location?    Answer:   [facility]    Order Specific Question:   Radiology Contrast Protocol - do NOT remove file path    Answer:   \\[email or web address]   All questions were answered. The patient knows to call the clinic with any problems, questions or concerns. No barriers to learning was detected. I spent 20 minutes counseling the patient face to face. The total time spent in the appointment was 25 minutes and more than 50% was on counseling and review of test results  I, Noor Dweik am acting as scribe for Dr. Truitt Merle.  I have reviewed the above documentation for accuracy and completeness, and I agree with the above.     Truitt Merle, MD 03/02/2018

## 2018-03-02 ENCOUNTER — Encounter: Payer: Self-pay | Admitting: Hematology

## 2018-03-02 ENCOUNTER — Inpatient Hospital Stay: Payer: 59

## 2018-03-02 ENCOUNTER — Inpatient Hospital Stay (HOSPITAL_BASED_OUTPATIENT_CLINIC_OR_DEPARTMENT_OTHER): Payer: 59 | Admitting: Hematology

## 2018-03-02 ENCOUNTER — Inpatient Hospital Stay: Payer: 59 | Attending: Hematology

## 2018-03-02 VITALS — BP 132/96 | HR 122 | Temp 98.1°F | Resp 17 | Ht 67.0 in | Wt 188.4 lb

## 2018-03-02 VITALS — BP 117/92 | HR 94 | Resp 18

## 2018-03-02 DIAGNOSIS — R109 Unspecified abdominal pain: Secondary | ICD-10-CM | POA: Insufficient documentation

## 2018-03-02 DIAGNOSIS — C7989 Secondary malignant neoplasm of other specified sites: Secondary | ICD-10-CM | POA: Diagnosis not present

## 2018-03-02 DIAGNOSIS — F329 Major depressive disorder, single episode, unspecified: Secondary | ICD-10-CM

## 2018-03-02 DIAGNOSIS — M549 Dorsalgia, unspecified: Secondary | ICD-10-CM | POA: Diagnosis not present

## 2018-03-02 DIAGNOSIS — F419 Anxiety disorder, unspecified: Secondary | ICD-10-CM | POA: Insufficient documentation

## 2018-03-02 DIAGNOSIS — C3490 Malignant neoplasm of unspecified part of unspecified bronchus or lung: Secondary | ICD-10-CM

## 2018-03-02 DIAGNOSIS — R51 Headache: Secondary | ICD-10-CM | POA: Diagnosis not present

## 2018-03-02 DIAGNOSIS — C3431 Malignant neoplasm of lower lobe, right bronchus or lung: Secondary | ICD-10-CM | POA: Insufficient documentation

## 2018-03-02 DIAGNOSIS — Z5111 Encounter for antineoplastic chemotherapy: Secondary | ICD-10-CM | POA: Diagnosis present

## 2018-03-02 DIAGNOSIS — K219 Gastro-esophageal reflux disease without esophagitis: Secondary | ICD-10-CM | POA: Insufficient documentation

## 2018-03-02 DIAGNOSIS — K30 Functional dyspepsia: Secondary | ICD-10-CM | POA: Insufficient documentation

## 2018-03-02 DIAGNOSIS — R748 Abnormal levels of other serum enzymes: Secondary | ICD-10-CM | POA: Diagnosis not present

## 2018-03-02 DIAGNOSIS — R110 Nausea: Secondary | ICD-10-CM | POA: Diagnosis not present

## 2018-03-02 DIAGNOSIS — Z923 Personal history of irradiation: Secondary | ICD-10-CM | POA: Insufficient documentation

## 2018-03-02 DIAGNOSIS — Z5189 Encounter for other specified aftercare: Secondary | ICD-10-CM | POA: Insufficient documentation

## 2018-03-02 DIAGNOSIS — Z79899 Other long term (current) drug therapy: Secondary | ICD-10-CM

## 2018-03-02 DIAGNOSIS — Z7982 Long term (current) use of aspirin: Secondary | ICD-10-CM

## 2018-03-02 DIAGNOSIS — G8929 Other chronic pain: Secondary | ICD-10-CM | POA: Insufficient documentation

## 2018-03-02 DIAGNOSIS — Z9884 Bariatric surgery status: Secondary | ICD-10-CM | POA: Insufficient documentation

## 2018-03-02 DIAGNOSIS — J441 Chronic obstructive pulmonary disease with (acute) exacerbation: Secondary | ICD-10-CM | POA: Diagnosis not present

## 2018-03-02 DIAGNOSIS — Z5112 Encounter for antineoplastic immunotherapy: Secondary | ICD-10-CM | POA: Insufficient documentation

## 2018-03-02 DIAGNOSIS — Z95828 Presence of other vascular implants and grafts: Secondary | ICD-10-CM

## 2018-03-02 LAB — CMP (CANCER CENTER ONLY)
ALT: 37 U/L (ref 0–44)
ANION GAP: 10 (ref 5–15)
AST: 38 U/L (ref 15–41)
Albumin: 3 g/dL — ABNORMAL LOW (ref 3.5–5.0)
Alkaline Phosphatase: 304 U/L — ABNORMAL HIGH (ref 38–126)
BUN: 5 mg/dL — ABNORMAL LOW (ref 6–20)
CO2: 29 mmol/L (ref 22–32)
CREATININE: 0.62 mg/dL (ref 0.44–1.00)
Calcium: 9 mg/dL (ref 8.9–10.3)
Chloride: 104 mmol/L (ref 98–111)
GFR, Est AFR Am: >60 mL/min (ref >60)
Glucose, Bld: 102 mg/dL — ABNORMAL HIGH (ref 70–99)
POTASSIUM: 4 mmol/L (ref 3.5–5.1)
Sodium: 143 mmol/L (ref 135–145)
TOTAL PROTEIN: 6.9 g/dL (ref 6.5–8.1)
Total Bilirubin: 0.3 mg/dL (ref 0.3–1.2)

## 2018-03-02 LAB — CBC WITH DIFFERENTIAL (CANCER CENTER ONLY)
ABS IMMATURE GRANULOCYTES: 0.02 10*3/uL (ref 0.00–0.07)
Basophils Absolute: 0.1 10*3/uL (ref 0.0–0.1)
Basophils Relative: 1 %
EOS PCT: 2 %
Eosinophils Absolute: 0.2 10*3/uL (ref 0.0–0.5)
HCT: 34.1 % — ABNORMAL LOW (ref 36.0–46.0)
Hemoglobin: 10.8 g/dL — ABNORMAL LOW (ref 12.0–15.0)
Immature Granulocytes: 0 %
Lymphocytes Relative: 21 %
Lymphs Abs: 1.6 10*3/uL (ref 0.7–4.0)
MCH: 29.6 pg (ref 26.0–34.0)
MCHC: 31.7 g/dL (ref 30.0–36.0)
MCV: 93.4 fL (ref 80.0–100.0)
MONO ABS: 1.1 10*3/uL — AB (ref 0.1–1.0)
MONOS PCT: 14 %
NEUTROS PCT: 62 %
NRBC: 0 % (ref 0.0–0.2)
Neutro Abs: 4.8 10*3/uL (ref 1.7–7.7)
PLATELETS: 464 10*3/uL — AB (ref 150–400)
RBC: 3.65 MIL/uL — ABNORMAL LOW (ref 3.87–5.11)
RDW: 14.8 % (ref 11.5–15.5)
WBC Count: 7.7 10*3/uL (ref 4.0–10.5)

## 2018-03-02 MED ORDER — SODIUM CHLORIDE 0.9 % IV SOLN
1200.0000 mg | Freq: Once | INTRAVENOUS | Status: AC
  Administered 2018-03-02: 1200 mg via INTRAVENOUS
  Filled 2018-03-02: qty 20

## 2018-03-02 MED ORDER — SODIUM CHLORIDE 0.9 % IV SOLN
Freq: Once | INTRAVENOUS | Status: AC
  Administered 2018-03-02: 09:00:00 via INTRAVENOUS
  Filled 2018-03-02: qty 250

## 2018-03-02 MED ORDER — DIPHENHYDRAMINE HCL 50 MG/ML IJ SOLN
INTRAMUSCULAR | Status: AC
  Filled 2018-03-02: qty 1

## 2018-03-02 MED ORDER — PALONOSETRON HCL INJECTION 0.25 MG/5ML
0.2500 mg | Freq: Once | INTRAVENOUS | Status: AC
  Administered 2018-03-02: 0.25 mg via INTRAVENOUS

## 2018-03-02 MED ORDER — PALONOSETRON HCL INJECTION 0.25 MG/5ML
INTRAVENOUS | Status: AC
  Filled 2018-03-02: qty 5

## 2018-03-02 MED ORDER — SODIUM CHLORIDE 0.9 % IV SOLN
15.0000 mg/kg | Freq: Once | INTRAVENOUS | Status: AC
  Administered 2018-03-02: 1300 mg via INTRAVENOUS
  Filled 2018-03-02: qty 48

## 2018-03-02 MED ORDER — SODIUM CHLORIDE 0.9 % IV SOLN
Freq: Once | INTRAVENOUS | Status: AC
  Administered 2018-03-02: 10:00:00 via INTRAVENOUS
  Filled 2018-03-02: qty 5

## 2018-03-02 MED ORDER — FAMOTIDINE IN NACL 20-0.9 MG/50ML-% IV SOLN
20.0000 mg | Freq: Once | INTRAVENOUS | Status: AC
  Administered 2018-03-02: 20 mg via INTRAVENOUS

## 2018-03-02 MED ORDER — HEPARIN SOD (PORK) LOCK FLUSH 100 UNIT/ML IV SOLN
500.0000 [IU] | Freq: Once | INTRAVENOUS | Status: AC | PRN
  Administered 2018-03-02: 500 [IU]
  Filled 2018-03-02: qty 5

## 2018-03-02 MED ORDER — SODIUM CHLORIDE 0.9 % IV SOLN
175.0000 mg/m2 | Freq: Once | INTRAVENOUS | Status: AC
  Administered 2018-03-02: 354 mg via INTRAVENOUS
  Filled 2018-03-02: qty 59

## 2018-03-02 MED ORDER — SODIUM CHLORIDE 0.9% FLUSH
10.0000 mL | INTRAVENOUS | Status: DC | PRN
  Administered 2018-03-02: 10 mL
  Filled 2018-03-02: qty 10

## 2018-03-02 MED ORDER — DIPHENHYDRAMINE HCL 50 MG/ML IJ SOLN
50.0000 mg | Freq: Once | INTRAMUSCULAR | Status: AC
  Administered 2018-03-02: 50 mg via INTRAVENOUS

## 2018-03-02 MED ORDER — SODIUM CHLORIDE 0.9 % IV SOLN
679.0000 mg | Freq: Once | INTRAVENOUS | Status: AC
  Administered 2018-03-02: 680 mg via INTRAVENOUS
  Filled 2018-03-02: qty 68

## 2018-03-02 MED ORDER — FAMOTIDINE IN NACL 20-0.9 MG/50ML-% IV SOLN
INTRAVENOUS | Status: AC
  Filled 2018-03-02: qty 50

## 2018-03-02 NOTE — Patient Instructions (Signed)
[facility] Discharge Instructions for Patients Receiving Chemotherapy  Today you received the following chemotherapy agents: Avastin, Tecentriq, Taxol, and Carboplatin.   To help prevent nausea and vomiting after your treatment, we encourage you to take your nausea medication as directed.   If you develop nausea and vomiting that is not controlled by your nausea medication, call the clinic.   BELOW ARE SYMPTOMS THAT SHOULD BE REPORTED IMMEDIATELY:  *FEVER GREATER THAN 100.5 F  *CHILLS WITH OR WITHOUT FEVER  NAUSEA AND VOMITING THAT IS NOT CONTROLLED WITH YOUR NAUSEA MEDICATION  *UNUSUAL SHORTNESS OF BREATH  *UNUSUAL BRUISING OR BLEEDING  TENDERNESS IN MOUTH AND THROAT WITH OR WITHOUT PRESENCE OF ULCERS  *URINARY PROBLEMS  *BOWEL PROBLEMS  UNUSUAL RASH Items with * indicate a potential emergency and should be followed up as soon as possible.  Feel free to call the clinic should you have any questions or concerns. The clinic phone number is [phone].  Please show the [facility] at check-in to the Emergency Department and triage nurse.

## 2018-03-06 ENCOUNTER — Inpatient Hospital Stay: Payer: 59

## 2018-03-06 DIAGNOSIS — C3490 Malignant neoplasm of unspecified part of unspecified bronchus or lung: Secondary | ICD-10-CM

## 2018-03-06 DIAGNOSIS — C3431 Malignant neoplasm of lower lobe, right bronchus or lung: Secondary | ICD-10-CM | POA: Diagnosis not present

## 2018-03-06 MED ORDER — PEGFILGRASTIM-CBQV 6 MG/0.6ML ~~LOC~~ SOSY
6.0000 mg | PREFILLED_SYRINGE | Freq: Once | SUBCUTANEOUS | Status: AC
  Administered 2018-03-06: 6 mg via SUBCUTANEOUS

## 2018-03-06 MED ORDER — PEGFILGRASTIM-CBQV 6 MG/0.6ML ~~LOC~~ SOSY
PREFILLED_SYRINGE | SUBCUTANEOUS | Status: AC
  Filled 2018-03-06: qty 0.6

## 2018-03-06 NOTE — Patient Instructions (Signed)

## 2018-03-10 ENCOUNTER — Ambulatory Visit (HOSPITAL_COMMUNITY)
Admission: RE | Admit: 2018-03-10 | Discharge: 2018-03-10 | Disposition: A | Discharge status: Home / Self Care | Payer: 59 | Source: Ambulatory Visit | Attending: Hematology | Admitting: Hematology

## 2018-03-10 ENCOUNTER — Telehealth: Payer: Self-pay

## 2018-03-10 ENCOUNTER — Encounter (HOSPITAL_COMMUNITY): Payer: Self-pay

## 2018-03-10 DIAGNOSIS — C3490 Malignant neoplasm of unspecified part of unspecified bronchus or lung: Secondary | ICD-10-CM | POA: Diagnosis present

## 2018-03-10 MED ORDER — SODIUM CHLORIDE (PF) 0.9 % IJ SOLN
INTRAMUSCULAR | Status: AC
  Filled 2018-03-10: qty 50

## 2018-03-10 MED ORDER — IOHEXOL 300 MG/ML  SOLN
100.0000 mL | Freq: Once | INTRAMUSCULAR | Status: AC | PRN
  Administered 2018-03-10: 100 mL via INTRAVENOUS

## 2018-03-10 MED ORDER — HEPARIN SOD (PORK) LOCK FLUSH 100 UNIT/ML IV SOLN
INTRAVENOUS | Status: AC
  Filled 2018-03-10: qty 5

## 2018-03-10 NOTE — Telephone Encounter (Signed)
Due to no over ride patient was scheduled at 9:15. Per 12/20 follow up

## 2018-03-11 ENCOUNTER — Encounter: Payer: Self-pay | Admitting: Nurse Practitioner

## 2018-03-13 ENCOUNTER — Other Ambulatory Visit: Payer: Self-pay

## 2018-03-13 DIAGNOSIS — C3490 Malignant neoplasm of unspecified part of unspecified bronchus or lung: Secondary | ICD-10-CM

## 2018-03-13 MED ORDER — MAGIC MOUTHWASH W/LIDOCAINE: 5.0000 mL | Freq: Three times a day (TID) | ORAL | 0 refills | Status: DC

## 2018-03-17 ENCOUNTER — Other Ambulatory Visit: Payer: Self-pay | Admitting: Nurse Practitioner

## 2018-03-17 ENCOUNTER — Other Ambulatory Visit: Payer: Self-pay | Admitting: Hematology

## 2018-03-17 DIAGNOSIS — C3490 Malignant neoplasm of unspecified part of unspecified bronchus or lung: Secondary | ICD-10-CM

## 2018-03-17 MED ORDER — ALPRAZOLAM 0.25 MG PO TABS: 0.2500 mg | ORAL_TABLET | Freq: Every evening | ORAL | 0 refills | Status: DC | PRN

## 2018-03-17 NOTE — Progress Notes (Signed)
[place]   [phone] Fax:([phone]) [phone]   Clinic Follow up Note   Patient Care Team: Redmond School, MD as PCP - General (Internal Medicine)  Date of Service:  03/20/2018  CHIEF COMPLAINT: F/u of lung cancer   SUMMARY OF ONCOLOGIC HISTORY: Oncology History   Cancer Staging Metastatic non-small cell lung cancer [facility]) Staging form: Lung, AJCC 8th Edition - Clinical stage from 10/17/2017: Stage IV (cT3, cN1, cM1b) - Signed by Truitt Merle, MD on 10/17/2017       Metastatic non-small cell lung cancer ([place])   09/20/2017 Imaging    US Breast Left 09/20/17  IMPRESSION Indeterminte palpable mass measuring 3.2x1.7x2.8 cm  inferior medial to the inframammary fold of the left breast.      09/20/2017 Initial Biopsy    Diagnosis 09/20/17 Soft Tissue Needle Core Biopsy, inferior medial to IMF - ADENOCARCINOMA. - SEE MICROSCOPIC DESCRIPTION.  Microscopic Comment Immunohistochemistry will be performed and reported as an addendum. (JDP:ah 09/23/17) ADDENDUM: Immunohistochemistry shows the tumor is strongly positive with cytokeratin AE1/AE3, cytokeratin 7 and shows moderate weak positivity with CDX-2. The tumor is negative with estrogen receptor, progesterone receptor, GCDFP, GATA-3, Napsin A, TTF-1, WT-1 and cytokeratin 20. The immunophenotype is consistent with metastatic carcinoma. The combination of CDX-2 and cytokeratin 7 positivity raises the possibility of an upper gastrointestinal primary. Clinical correlation is essential.    10/06/2017 Initial Diagnosis    Metastatic adenocarcinoma involving soft tissue with unknown primary site [facility])    10/10/2017 Procedure    Upper Endoscopy by Dr. Silverio Decamp 10/10/17  IMPRESSION - Normal esophagus. - Z-line regular, 35 cm from the incisors. - Gastric bypass with a normal-sized pouch and intact staple line. Gastrojejunal anastomosis characterized by healthy appearing mucosa. - No specimens collected.    10/12/2017  Imaging    CT CAP WO Contrast 10/12/17  IMPRESSION: 7 cm central right lung mass involving the right hilum, with postobstructive collapse of the right middle lobe, highly suspicious for primary bronchogenic carcinoma. 5 cm masslike opacity in the superior segment of the right lower lobe may represent carcinoma or postobstructive pneumonitis. 3.3 cm soft tissue mass in the lower anterior chest wall soft tissues, suspicious for metastatic disease. No evidence of abdominal or pelvic metastatic disease.     10/14/2017 Imaging    MRI Brain 10/14/17  IMPRESSION: 1. No metastatic disease or acute intracranial abnormality. Normal MRI appearance of the brain. 2. 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      10/17/2017 Cancer Staging    Staging form: Lung, AJCC 8th Edition - Clinical stage from 10/17/2017: Stage IV (cT3, cN1, cM1b) - Signed by Truitt Merle, MD on 10/17/2017    10/24/2017 - 11/04/2017 Radiation Therapy     The Right lung mass was treated to 30 Gy in 10 fractions of 3 Gy    11/08/2017 -  Chemotherapy    -first line chemo with carboplatin, paclitaxel, Atezolizumab and bevacizumab every 3weeks starting 11/08/17.  -Switched to maintenance therapy with avastin and atezolizumab q3weeks on 03/24/18    01/10/2018 Imaging    01/10/2018 CT CA IMPRESSION: 1. Response to therapy of central right lower lobe lung mass. Re-expansion of the right middle lobe with significantly improved right lower lobe postobstructive pneumonitis. Residual geographic airspace and ground-glass opacity within the medial right lung, primarily favored to be radiation induced. 2. Near complete resolution of presternal soft tissue mass. 3. New right adrenal nodule since 10/11/2017, suspicious for metastatic disease. 4. Right-sided Port-A-Cath with probable nonocclusive thrombus  along the catheter within the right brachiocephalic vein. 5. Age advanced coronary artery atherosclerosis.  Recommend assessment of coronary risk factors and consideration of medical therapy. 6. Aortic atherosclerosis (ICD10-I70.0) and emphysema (ICD10-J43.9).    03/10/2018 Imaging    CT CAP W Contrast 03/10/18 IMPRESSION: 1. Radiation changes involving the right paramediastinal lung and right hilum but no findings suspicious for residual or recurrent tumor. 2. No evidence of pulmonary metastatic disease. Stable emphysematous changes and areas of pulmonary scarring. 3. Stable 11 mm right adrenal gland nodule. 4. No findings for metastatic disease involving the liver or bony structures.      CURRENT THERAPY:  -first line chemo with carboplatin, paclitaxel, Atezolizumab and bevacizumab every 3weeks starting 11/08/17 s/p 6 cycles   -Switched to maintenance therapy with avastin and atezolizumab q3weeks on 03/24/18  INTERVAL HISTORY:  Yvonne Lang is here for a follow up of and treatment. She presents to the clinic today accompanied by her sister. She notes for the past 1-2 weeks she has had breathing trouble which is labored and SOB and dry coughing and headaches. She has been using breathing treatment ad inhaler at home. She thinks this is related to something she received from a family member during Christmas. She notes she takes Reishi mushroom tablets daily and Tea. She would like a refill of magic mouthwash and compazine for her upset stomach and mouth soreness      REVIEW OF SYSTEMS:   Constitutional: Denies fevers, chills or abnormal weight loss (+) headaches Eyes: Denies blurriness of vision Ears, nose, mouth, throat, and face: Denies mucositis or sore throat (+) mouth soreness  Respiratory: Denies wheezes (+) dry cough (+) SOB Cardiovascular: Denies palpitation, chest discomfort or lower extremity swelling Gastrointestinal:  Denies heartburn or change in bowel habits (+) nausea  Skin: Denies abnormal skin rashes Lymphatics: Denies new lymphadenopathy or easy  bruising Neurological:Denies numbness, tingling or new weaknesses Behavioral/Psych: Mood is stable, no new changes  All other systems were reviewed with the patient and are negative.  MEDICAL HISTORY:  Past Medical History:  Diagnosis Date  . Anxiety   . Bronchitis   . COPD (chronic obstructive pulmonary disease) ([employer] [place])   . Depression   . GERD (gastroesophageal reflux disease)   . H/O gastric bypass    1999  . Irritable bowel syndrome   . Lung cancer [facility])     SURGICAL HISTORY: Past Surgical History:  Procedure Laterality Date  . BACK SURGERY    . CHOLECYSTECTOMY    . COLON SURGERY     for a kink in her colon and 12 inchs removed  . COLONOSCOPY N/A 12/13/2013   Procedure: COLONOSCOPY;  Surgeon: Rogene Houston, MD;  Location: [facility];  Service: Endoscopy;  Laterality: N/A;  200  . ESOPHAGOGASTRODUODENOSCOPY N/A 12/13/2013   Procedure: ESOPHAGOGASTRODUODENOSCOPY (EGD);  Surgeon: Rogene Houston, MD;  Location: [facility];  Service: Endoscopy;  Laterality: N/A;  . Gastric Bypas  2000  . IR IMAGING GUIDED PORT INSERTION  11/14/2017    I have reviewed the social history and family history with the patient and they are unchanged from previous note.  ALLERGIES:  is allergic to codeine.  MEDICATIONS:  Current Outpatient Medications  Medication Sig Dispense Refill  . albuterol (PROVENTIL HFA) 108 (90 Base) MCG/ACT inhaler Inhale 2 puffs into the lungs 2 (two) times daily. 1 Inhaler 2  . albuterol (PROVENTIL) 4 MG tablet Take 4 mg by mouth 2 (two) times daily.     Marland Kitchen ALPRAZolam (  XANAX) 0.25 MG tablet Take 1 tablet (0.25 mg total) by mouth at bedtime as needed for anxiety. 30 tablet 0  . amoxicillin-clavulanate (AUGMENTIN) 875-125 MG tablet Take 1 tablet by mouth 2 (two) times daily. 14 tablet 0  . aspirin 81 MG tablet Take 81 mg by mouth daily.    . butalbital-acetaminophen-caffeine (FIORICET, ESGIC) 50-325-40 MG tablet Take 1-2 tablets by mouth every 6 (six) hours as needed  for headache. 60 tablet 1  . CALCIUM PO Take by mouth daily.    Marland Kitchen dicyclomine (BENTYL) 20 MG tablet Take 1 tablet (20 mg total) by mouth every 6 (six) hours. 60 tablet 1  . DULoxetine (CYMBALTA) 60 MG capsule Take 60 mg by mouth daily.    Marland Kitchen gabapentin (NEURONTIN) 300 MG capsule TAKE 1 CAPSULE BY MOUTH 4 TIMES DAILY  4  . ipratropium-albuterol (DUONEB) 0.5-2.5 (3) MG/3ML SOLN Take 3 mLs by nebulization every 4 (four) hours as needed. 360 mL 1  . lidocaine-prilocaine (EMLA) cream Apply 1 application topically as needed. Apply to port one hour prior to port access/chemo 30 g 2  . magic mouthwash w/lidocaine SOLN Take 5 mLs by mouth 3 (three) times daily. Swish and spit 3 x daily 240 mL 0  . mirtazapine (REMERON) 7.5 MG tablet Take 1 tablet (7.5 mg total) by mouth at bedtime. 30 tablet 1  . Multiple Vitamin (MULTIVITAMIN) tablet Take 1 tablet by mouth daily.    Marland Kitchen omeprazole (PRILOSEC) 20 MG capsule Take 1 capsule (20 mg total) by mouth 2 (two) times daily before a meal. 60 capsule 0  . ondansetron (ZOFRAN) 8 MG tablet TAKE 1 TABLET BY MOUTH EVERY 8 HOURS AS NEEDED FOR NAUSEA AND VOMITING 30 tablet 1  . potassium chloride SA (K-DUR,KLOR-CON) 20 MEQ tablet Take 1 tablet (20 mEq total) by mouth daily. 60 tablet 0  . prochlorperazine (COMPAZINE) 10 MG tablet Take 1 tablet (10 mg total) by mouth every 6 (six) hours as needed for nausea or vomiting. 30 tablet 0  . prochlorperazine (COMPAZINE) 10 MG tablet TAKE 1 TABLET BY MOUTH EVERY 6 HOURS AS NEEDED FOR NAUSEA AND VOMITING 30 tablet 2  . RaNITidine HCl (ZANTAC PO) Take by mouth 3 (three) times daily.    Marland Kitchen rOPINIRole (REQUIP) 0.5 MG tablet TAKE 1 TABLET BY MOUTH AT BEDTIME FOR 30 DAYS  11  . sucralfate (CARAFATE) 1 g tablet TAKE 1 TABLET(1 GRAM) BY MOUTH FOUR TIMES DAILY 120 tablet 0  . traMADol (ULTRAM) 50 MG tablet Take 1 tablet (50 mg total) by mouth every 6 (six) hours as needed. 90 tablet 0  . TRELEGY ELLIPTA 100-62.5-25 MCG/INH AEPB INHALE 1 PUFF  INTO THE LUNGS EVERY DAY  5  . UNABLE TO FIND Inhale into the lungs. Med Name: Trilogy as needed     No current facility-administered medications for this visit.     PHYSICAL EXAMINATION: ECOG PERFORMANCE STATUS: 2 - Symptomatic, <50% confined to bed  Vitals:   03/20/18 1004  BP: 128/76  Pulse: (!) 110  Resp: 17  Temp: 97.9 F (36.6 C)  SpO2: 97%   Filed Weights   03/20/18 1004  Weight: 185 lb 14.4 oz (84.3 kg)    GENERAL:alert, no distress and comfortable SKIN: skin color, texture, turgor are normal, no rashes or significant lesions EYES: normal, Conjunctiva are pink and non-injected, sclera clear OROPHARYNX:no exudate, no erythema and lips, buccal mucosa, and tongue normal (+) tenderness of facial sinuses NECK: supple, thyroid normal size, non-tender, without nodularity LYMPH:  no palpable lymphadenopathy in the cervical, axillary or inguinal LUNGS: clear percussion (+) Wheezes  HEART: regular rate & rhythm and no murmurs and no lower extremity edema ABDOMEN:abdomen soft, non-tender and normal bowel sounds Musculoskeletal:no cyanosis of digits and no clubbing  NEURO: alert & oriented x 3 with fluent speech, no focal motor/sensory deficits  LABORATORY DATA:  I have reviewed the data as listed CBC Latest Ref Rng & Units 03/02/2018 02/10/2018 01/12/2018  WBC 4.0 - 10.5 K/uL 7.7 8.9 7.1  Hemoglobin 12.0 - 15.0 g/dL 10.8(L) 11.6(L) 11.0(L)  Hematocrit 36.0 - 46.0 % 34.1(L) 36.7 34.4(L)  Platelets 150 - 400 K/uL 464(H) 448(H) 366     CMP Latest Ref Rng & Units 03/02/2018 02/10/2018 01/12/2018  Glucose 70 - 99 mg/dL 102(H) 99 128(H)  BUN 6 - 20 mg/dL 5(L) <4(L) 4(L)  Creatinine 0.44 - 1.00 mg/dL 0.62 0.66 0.66  Sodium 135 - 145 mmol/L 143 140 139  Potassium 3.5 - 5.1 mmol/L 4.0 3.8 4.2  Chloride 98 - 111 mmol/L 104 104 102  CO2 22 - 32 mmol/L 29 27 27   Calcium 8.9 - 10.3 mg/dL 9.0 8.6(L) 8.5(L)  Total Protein 6.5 - 8.1 g/dL 6.9 6.7 6.3(L)  Total Bilirubin 0.3 - 1.2  mg/dL 0.3 <0.2(L) <0.2(L)  Alkaline Phos 38 - 126 U/L 304(H) 163(H) 148(H)  AST 15 - 41 U/L 38 16 17  ALT 0 - 44 U/L 37 14 13      RADIOGRAPHIC STUDIES: I have personally reviewed the radiological images as listed and agreed with the findings in the report. No results found.   ASSESSMENT & PLAN:  Yvonne Lang is a 52 y.o. female with   1. 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 -Diagnosed in 09/2017. Treated with chemo and radiation. Currently on first line carboplatin, paclitaxel, Atezolizumab and bevacizumab every 3weeks s/p 6 cycles. She has been tolerating well. -I discussed her CT scan from 12/201/19 which shows significant reduction of the mass in right lung,resolved chest wall metastasis and no new lesions. Overall nearly complete response. I personally reviewed the images with pt. She was very pleased. Will rescan in 3 months  -I will start her on maintenance therapy with avastin and atezolizumab every 3 weeks starting 1/3 -she is clinically doing well, previously left frank pain has resolved. She has some COPD exacerbation today, will give abx and neb treatment  -F/u in 3 weeks    2. Frontal Headaches -No evidence of brain metastasis. -Was evaluated by Dr. Mickeal Skinner, who thinks it's from overuse of Tramadol and Fioricet. -Continue to f/u with Dr. Mickeal Skinner  3. Anxiety and Depression -Currently on Xanax and Cymbalta. Continue.  4. Chronic back pain -Currently on Tramadol 50 mg q6Hrs. Continue. Stable   5. Low appetite, Nausea  -Currently on mirtazapine, omeprazole, ondansetron, prochlorperazine, and sucralfate. Continue. -overall improved, stable.   6. Goal of care discussion  -The patient understands the goal of care is palliative. -she is full code now   7. COPD exacerbation -She has significant wheezes on exam today  -I will prescribe neb breathing treatment to use at home -I will call in antibiotics Augmentin today for 7 days      Plan  -scan reviewed, she has had near complete response  -I refilled her Compazine and magic mouthwash and prescribe her antibiotics Augmentin  -she will start maintenance Avastin and atezolizumab on January 3, and continue every 3 weeks -I provided letter to confirm her chemo treatments and Neulasta injections  today for her insurance    No problem-specific Assessment & Plan notes found for this encounter.   No orders of the defined types were placed in this encounter.  All questions were answered. The patient knows to call the clinic with any problems, questions or concerns. No barriers to learning was detected. I spent 20 minutes counseling the patient face to face. The total time spent in the appointment was 25 minutes and more than 50% was on counseling and review of test results     Truitt Merle, MD 03/20/2018   I, Joslyn Devon, am acting as scribe for Truitt Merle, MD.   I have reviewed the above documentation for accuracy and completeness, and I agree with the above.

## 2018-03-20 ENCOUNTER — Inpatient Hospital Stay (HOSPITAL_BASED_OUTPATIENT_CLINIC_OR_DEPARTMENT_OTHER): Payer: 59 | Admitting: Hematology

## 2018-03-20 ENCOUNTER — Encounter: Payer: Self-pay | Admitting: Hematology

## 2018-03-20 VITALS — BP 128/76 | HR 110 | Temp 97.9°F | Resp 17 | Ht 67.0 in | Wt 185.9 lb

## 2018-03-20 DIAGNOSIS — K30 Functional dyspepsia: Secondary | ICD-10-CM | POA: Diagnosis not present

## 2018-03-20 DIAGNOSIS — C3431 Malignant neoplasm of lower lobe, right bronchus or lung: Secondary | ICD-10-CM | POA: Diagnosis not present

## 2018-03-20 DIAGNOSIS — J441 Chronic obstructive pulmonary disease with (acute) exacerbation: Secondary | ICD-10-CM

## 2018-03-20 DIAGNOSIS — Z79899 Other long term (current) drug therapy: Secondary | ICD-10-CM

## 2018-03-20 DIAGNOSIS — C7989 Secondary malignant neoplasm of other specified sites: Secondary | ICD-10-CM

## 2018-03-20 DIAGNOSIS — Z7982 Long term (current) use of aspirin: Secondary | ICD-10-CM

## 2018-03-20 DIAGNOSIS — G8929 Other chronic pain: Secondary | ICD-10-CM

## 2018-03-20 DIAGNOSIS — M549 Dorsalgia, unspecified: Secondary | ICD-10-CM

## 2018-03-20 DIAGNOSIS — R110 Nausea: Secondary | ICD-10-CM

## 2018-03-20 DIAGNOSIS — C3490 Malignant neoplasm of unspecified part of unspecified bronchus or lung: Secondary | ICD-10-CM

## 2018-03-20 DIAGNOSIS — Z923 Personal history of irradiation: Secondary | ICD-10-CM

## 2018-03-20 MED ORDER — AMOXICILLIN-POT CLAVULANATE 875-125 MG PO TABS: 1.0000 | ORAL_TABLET | Freq: Two times a day (BID) | ORAL | 0 refills | Status: DC

## 2018-03-20 MED ORDER — PROCHLORPERAZINE MALEATE 10 MG PO TABS: ORAL_TABLET | ORAL | 2 refills | Status: DC

## 2018-03-20 MED ORDER — IPRATROPIUM-ALBUTEROL 0.5-2.5 (3) MG/3ML IN SOLN: 3.0000 mL | RESPIRATORY_TRACT | 1 refills | Status: AC | PRN

## 2018-03-21 ENCOUNTER — Encounter: Payer: Self-pay | Admitting: Hematology

## 2018-03-21 ENCOUNTER — Other Ambulatory Visit: Payer: Self-pay

## 2018-03-21 DIAGNOSIS — K1379 Other lesions of oral mucosa: Secondary | ICD-10-CM

## 2018-03-21 MED ORDER — MAGIC MOUTHWASH W/LIDOCAINE: 5.0000 mL | Freq: Three times a day (TID) | ORAL | 0 refills | Status: DC

## 2018-03-21 NOTE — Progress Notes (Signed)
Called in [place] to pharmacy in chart.

## 2018-03-24 ENCOUNTER — Inpatient Hospital Stay: Payer: 59

## 2018-03-24 ENCOUNTER — Other Ambulatory Visit: Payer: Self-pay | Admitting: Nurse Practitioner

## 2018-03-24 ENCOUNTER — Other Ambulatory Visit: Payer: Self-pay | Admitting: Oncology

## 2018-03-24 ENCOUNTER — Other Ambulatory Visit: Payer: Self-pay

## 2018-03-24 ENCOUNTER — Inpatient Hospital Stay: Payer: 59 | Attending: Hematology

## 2018-03-24 VITALS — BP 114/83 | HR 105 | Temp 98.2°F | Resp 17

## 2018-03-24 DIAGNOSIS — Z452 Encounter for adjustment and management of vascular access device: Secondary | ICD-10-CM | POA: Diagnosis present

## 2018-03-24 DIAGNOSIS — G8929 Other chronic pain: Secondary | ICD-10-CM | POA: Diagnosis not present

## 2018-03-24 DIAGNOSIS — R110 Nausea: Secondary | ICD-10-CM | POA: Insufficient documentation

## 2018-03-24 DIAGNOSIS — Z7982 Long term (current) use of aspirin: Secondary | ICD-10-CM | POA: Insufficient documentation

## 2018-03-24 DIAGNOSIS — Z5112 Encounter for antineoplastic immunotherapy: Secondary | ICD-10-CM | POA: Insufficient documentation

## 2018-03-24 DIAGNOSIS — R071 Chest pain on breathing: Secondary | ICD-10-CM | POA: Diagnosis not present

## 2018-03-24 DIAGNOSIS — Z79899 Other long term (current) drug therapy: Secondary | ICD-10-CM | POA: Insufficient documentation

## 2018-03-24 DIAGNOSIS — C3431 Malignant neoplasm of lower lobe, right bronchus or lung: Secondary | ICD-10-CM | POA: Diagnosis not present

## 2018-03-24 DIAGNOSIS — Z923 Personal history of irradiation: Secondary | ICD-10-CM | POA: Insufficient documentation

## 2018-03-24 DIAGNOSIS — R51 Headache: Secondary | ICD-10-CM | POA: Insufficient documentation

## 2018-03-24 DIAGNOSIS — F329 Major depressive disorder, single episode, unspecified: Secondary | ICD-10-CM | POA: Diagnosis not present

## 2018-03-24 DIAGNOSIS — Z9221 Personal history of antineoplastic chemotherapy: Secondary | ICD-10-CM | POA: Diagnosis not present

## 2018-03-24 DIAGNOSIS — Z95828 Presence of other vascular implants and grafts: Secondary | ICD-10-CM

## 2018-03-24 DIAGNOSIS — C3490 Malignant neoplasm of unspecified part of unspecified bronchus or lung: Secondary | ICD-10-CM

## 2018-03-24 DIAGNOSIS — M549 Dorsalgia, unspecified: Secondary | ICD-10-CM | POA: Diagnosis not present

## 2018-03-24 DIAGNOSIS — D630 Anemia in neoplastic disease: Secondary | ICD-10-CM

## 2018-03-24 DIAGNOSIS — F419 Anxiety disorder, unspecified: Secondary | ICD-10-CM | POA: Insufficient documentation

## 2018-03-24 LAB — CMP (CANCER CENTER ONLY)
ALT: 61 U/L — ABNORMAL HIGH (ref 0–44)
AST: 82 U/L — ABNORMAL HIGH (ref 15–41)
Albumin: 3 g/dL — ABNORMAL LOW (ref 3.5–5.0)
Alkaline Phosphatase: 321 U/L — ABNORMAL HIGH (ref 38–126)
Anion gap: 9 (ref 5–15)
BUN: 5 mg/dL — ABNORMAL LOW (ref 6–20)
CO2: 28 mmol/L (ref 22–32)
Calcium: 9 mg/dL (ref 8.9–10.3)
Chloride: 102 mmol/L (ref 98–111)
Creatinine: 0.59 mg/dL (ref 0.44–1.00)
GFR, Est AFR Am: >60 mL/min (ref >60)
GFR, Estimated: >60 mL/min (ref >60)
Glucose, Bld: 100 mg/dL — ABNORMAL HIGH (ref 70–99)
Potassium: 3.9 mmol/L (ref 3.5–5.1)
SODIUM: 139 mmol/L (ref 135–145)
Total Bilirubin: <0.2 mg/dL — ABNORMAL LOW (ref 0.3–1.2)
Total Protein: 7.2 g/dL (ref 6.5–8.1)

## 2018-03-24 LAB — CBC WITH DIFFERENTIAL (CANCER CENTER ONLY)
Abs Immature Granulocytes: 0.02 10*3/uL (ref 0.00–0.07)
Basophils Absolute: 0 10*3/uL (ref 0.0–0.1)
Basophils Relative: 1 %
Eosinophils Absolute: 0.1 10*3/uL (ref 0.0–0.5)
Eosinophils Relative: 1 %
HCT: 34.9 % — ABNORMAL LOW (ref 36.0–46.0)
Hemoglobin: 11 g/dL — ABNORMAL LOW (ref 12.0–15.0)
Immature Granulocytes: 0 %
Lymphocytes Relative: 17 %
Lymphs Abs: 1.3 10*3/uL (ref 0.7–4.0)
MCH: 30.2 pg (ref 26.0–34.0)
MCHC: 31.5 g/dL (ref 30.0–36.0)
MCV: 95.9 fL (ref 80.0–100.0)
Monocytes Absolute: 0.7 10*3/uL (ref 0.1–1.0)
Monocytes Relative: 10 %
NEUTROS PCT: 71 %
Neutro Abs: 5.3 10*3/uL (ref 1.7–7.7)
Platelet Count: 422 10*3/uL — ABNORMAL HIGH (ref 150–400)
RBC: 3.64 MIL/uL — ABNORMAL LOW (ref 3.87–5.11)
RDW: 15.1 % (ref 11.5–15.5)
WBC Count: 7.4 10*3/uL (ref 4.0–10.5)
nRBC: 0 % (ref 0.0–0.2)

## 2018-03-24 LAB — TSH: TSH: 0.883 u[IU]/mL (ref 0.308–3.960)

## 2018-03-24 LAB — FERRITIN: Ferritin: 161 ng/mL (ref 11–307)

## 2018-03-24 MED ORDER — SODIUM CHLORIDE 0.9 % IV SOLN
1200.0000 mg | Freq: Once | INTRAVENOUS | Status: AC
  Administered 2018-03-24: 1200 mg via INTRAVENOUS
  Filled 2018-03-24: qty 20

## 2018-03-24 MED ORDER — SODIUM CHLORIDE 0.9% FLUSH
10.0000 mL | INTRAVENOUS | Status: DC | PRN
  Administered 2018-03-24: 10 mL
  Filled 2018-03-24: qty 10

## 2018-03-24 MED ORDER — ALTEPLASE 2 MG IJ SOLR
2.0000 mg | Freq: Once | INTRAMUSCULAR | Status: AC | PRN
  Administered 2018-03-24: 2 mg
  Filled 2018-03-24: qty 2

## 2018-03-24 MED ORDER — HEPARIN SOD (PORK) LOCK FLUSH 100 UNIT/ML IV SOLN
500.0000 [IU] | Freq: Once | INTRAVENOUS | Status: AC | PRN
  Administered 2018-03-24: 500 [IU]
  Filled 2018-03-24: qty 5

## 2018-03-24 MED ORDER — ALTEPLASE 2 MG IJ SOLR
2.0000 mg | Freq: Once | INTRAMUSCULAR | Status: DC | PRN
  Filled 2018-03-24: qty 2

## 2018-03-24 MED ORDER — SODIUM CHLORIDE 0.9 % IV SOLN
Freq: Once | INTRAVENOUS | Status: AC
  Administered 2018-03-24: 14:00:00 via INTRAVENOUS
  Filled 2018-03-24: qty 250

## 2018-03-24 MED ORDER — SODIUM CHLORIDE 0.9 % IV SOLN
15.5000 mg/kg | Freq: Once | INTRAVENOUS | Status: AC
  Administered 2018-03-24: 1300 mg via INTRAVENOUS
  Filled 2018-03-24: qty 48

## 2018-03-24 MED ORDER — ALTEPLASE 2 MG IJ SOLR
INTRAMUSCULAR | Status: AC
  Filled 2018-03-24: qty 2

## 2018-03-24 NOTE — Patient Instructions (Signed)
[facility] Discharge Instructions for Patients Receiving Chemotherapy  Today you received the following chemotherapy agents: Tecentriq, Avastin  To help prevent nausea and vomiting after your treatment, we encourage you to take your nausea medication as directed.   If you develop nausea and vomiting that is not controlled by your nausea medication, call the clinic.   BELOW ARE SYMPTOMS THAT SHOULD BE REPORTED IMMEDIATELY:  *FEVER GREATER THAN 100.5 F  *CHILLS WITH OR WITHOUT FEVER  NAUSEA AND VOMITING THAT IS NOT CONTROLLED WITH YOUR NAUSEA MEDICATION  *UNUSUAL SHORTNESS OF BREATH  *UNUSUAL BRUISING OR BLEEDING  TENDERNESS IN MOUTH AND THROAT WITH OR WITHOUT PRESENCE OF ULCERS  *URINARY PROBLEMS  *BOWEL PROBLEMS  UNUSUAL RASH Items with * indicate a potential emergency and should be followed up as soon as possible.  Feel free to call the clinic should you have any questions or concerns. The clinic phone number is [phone].  Please show the [place] [facility] at check-in to the Emergency Department and triage nurse.  Atezolizumab injection What is this medicine? ATEZOLIZUMAB (a te zoe LIZ ue mab) is a monoclonal antibody. It is used to treat bladder cancer (urothelial cancer), non-small cell lung cancer, small cell lung cancer, and breast cancer. This medicine may be used for other purposes; ask your health care provider or pharmacist if you have questions. COMMON BRAND NAME(S): Tecentriq What should I tell my health care provider before I take this medicine? They need to know if you have any of these conditions: -diabetes -immune system problems -infection -inflammatory bowel disease -liver disease -lung or breathing disease -lupus -nervous system problems like myasthenia gravis or Guillain-Barre syndrome -organ transplant -an unusual or allergic reaction to atezolizumab, other medicines, foods, dyes, or preservatives -pregnant or trying to get  pregnant -breast-feeding How should I use this medicine? This medicine is for infusion into a vein. It is given by a health care professional in a hospital or clinic setting. A special MedGuide will be given to you before each treatment. Be sure to read this information carefully each time. Talk to your pediatrician regarding the use of this medicine in children. Special care may be needed. Overdosage: If you think you have taken too much of this medicine contact a poison control center or emergency room at once. NOTE: This medicine is only for you. Do not share this medicine with others. What if I miss a dose? It is important not to miss your dose. Call your doctor or health care professional if you are unable to keep an appointment. What may interact with this medicine? Interactions have not been studied. This list may not describe all possible interactions. Give your health care provider a list of all the medicines, herbs, non-prescription drugs, or dietary supplements you use. Also tell them if you smoke, drink alcohol, or use illegal drugs. Some items may interact with your medicine. What should I watch for while using this medicine? Your condition will be monitored carefully while you are receiving this medicine. You may need blood work done while you are taking this medicine. Do not become pregnant while taking this medicine or for at least 5 months after stopping it. Women should inform their doctor if they wish to become pregnant or think they might be pregnant. There is a potential for serious side effects to an unborn child. Talk to your health care professional or pharmacist for more information. Do not breast-feed an infant while taking this medicine or for at least 5  months after the last dose. What side effects may I notice from receiving this medicine? Side effects that you should report to your doctor or health care professional as soon as possible: -allergic reactions like skin  rash, itching or hives, swelling of the face, lips, or tongue -black, tarry stools -bloody or watery diarrhea -breathing problems -changes in vision -chest pain or chest tightness -chills -facial flushing -fever -headache -signs and symptoms of high blood sugar such as dizziness; dry mouth; dry skin; fruity breath; nausea; stomach pain; increased hunger or thirst; increased urination -signs and symptoms of liver injury like dark yellow or brown urine; general ill feeling or flu-like symptoms; light-colored stools; loss of appetite; nausea; right upper belly pain; unusually weak or tired; yellowing of the eyes or skin -stomach pain -trouble passing urine or change in the amount of urine Side effects that usually do not require medical attention (report to your doctor or health care professional if they continue or are bothersome): -cough -diarrhea -joint pain -muscle pain -muscle weakness -tiredness -weight loss This list may not describe all possible side effects. Call your doctor for medical advice about side effects. You may report side effects to FDA at 1-800-FDA-1088. Where should I keep my medicine? This drug is given in a hospital or clinic and will not be stored at home. NOTE: This sheet is a summary. It may not cover all possible information. If you have questions about this medicine, talk to your doctor, pharmacist, or health care provider.  2019 Elsevier/Gold Standard (2017-06-10 09:33:38)  Bevacizumab injection What is this medicine? BEVACIZUMAB (be va SIZ yoo mab) is a monoclonal antibody. It is used to treat many types of cancer. This medicine may be used for other purposes; ask your health care provider or pharmacist if you have questions. COMMON BRAND NAME(S): Avastin, MVASI What should I tell my health care provider before I take this medicine? They need to know if you have any of these conditions: -diabetes -heart disease -high blood pressure -history of  coughing up blood -prior anthracycline chemotherapy (e.g., doxorubicin, daunorubicin, epirubicin) -recent or ongoing radiation therapy -recent or planning to have surgery -stroke -an unusual or allergic reaction to bevacizumab, hamster proteins, mouse proteins, other medicines, foods, dyes, or preservatives -pregnant or trying to get pregnant -breast-feeding How should I use this medicine? This medicine is for infusion into a vein. It is given by a health care professional in a hospital or clinic setting. Talk to your pediatrician regarding the use of this medicine in children. Special care may be needed. Overdosage: If you think you have taken too much of this medicine contact a poison control center or emergency room at once. NOTE: This medicine is only for you. Do not share this medicine with others. What if I miss a dose? It is important not to miss your dose. Call your doctor or health care professional if you are unable to keep an appointment. What may interact with this medicine? Interactions are not expected. This list may not describe all possible interactions. Give your health care provider a list of all the medicines, herbs, non-prescription drugs, or dietary supplements you use. Also tell them if you smoke, drink alcohol, or use illegal drugs. Some items may interact with your medicine. What should I watch for while using this medicine? Your condition will be monitored carefully while you are receiving this medicine. You will need important blood work and urine testing done while you are taking this medicine. This medicine may increase your  risk to bruise or bleed. Call your doctor or health care professional if you notice any unusual bleeding. This medicine should be started at least 28 days following major surgery and the site of the surgery should be totally healed. Check with your doctor before scheduling dental work or surgery while you are receiving this treatment. Talk to your  doctor if you have recently had surgery or if you have a wound that has not healed. Do not become pregnant while taking this medicine or for 6 months after stopping it. Women should inform their doctor if they wish to become pregnant or think they might be pregnant. There is a potential for serious side effects to an unborn child. Talk to your health care professional or pharmacist for more information. Do not breast-feed an infant while taking this medicine and for 6 months after the last dose. This medicine has caused ovarian failure in some women. This medicine may interfere with the ability to have a child. You should talk to your doctor or health care professional if you are concerned about your fertility. What side effects may I notice from receiving this medicine? Side effects that you should report to your doctor or health care professional as soon as possible: -allergic reactions like skin rash, itching or hives, swelling of the face, lips, or tongue -chest pain or chest tightness -chills -coughing up blood -high fever -seizures -severe constipation -signs and symptoms of bleeding such as bloody or black, tarry stools; red or dark-brown urine; spitting up blood or brown material that looks like coffee grounds; red spots on the skin; unusual bruising or bleeding from the eye, gums, or nose -signs and symptoms of a blood clot such as breathing problems; chest pain; severe, sudden headache; pain, swelling, warmth in the leg -signs and symptoms of a stroke like changes in vision; confusion; trouble speaking or understanding; severe headaches; sudden numbness or weakness of the face, arm or leg; trouble walking; dizziness; loss of balance or coordination -stomach pain -sweating -swelling of legs or ankles -vomiting -weight gain Side effects that usually do not require medical attention (report to your doctor or health care professional if they continue or are bothersome): -back  pain -changes in taste -decreased appetite -dry skin -nausea -tiredness This list may not describe all possible side effects. Call your doctor for medical advice about side effects. You may report side effects to FDA at 1-800-FDA-1088. Where should I keep my medicine? This drug is given in a hospital or clinic and will not be stored at home. NOTE: This sheet is a summary. It may not cover all possible information. If you have questions about this medicine, talk to your doctor, pharmacist, or health care provider.  2019 Elsevier/Gold Standard (2016-03-05 14:33:29)

## 2018-03-24 NOTE — Addendum Note (Signed)
Addended by: Caryl Comes E on: 03/24/2018 12:48 PM   Modules accepted: Orders

## 2018-03-24 NOTE — Progress Notes (Signed)
Cathflo admin at 1244.  Tag placed on syringe with date, time, initials, and drug.  Pt advised of cathflo purpose and verbalized understanding.  Tolerated well.  Ambulatory to lobby.  Waiting on peripheral stick labs to result.  Charge RN Melanie aware.

## 2018-03-24 NOTE — Progress Notes (Signed)
Port flushed several times and repositioned with no blood return. Infusion called and an RN will administer CATHFLOW. Labs are being drawn by LAB 1. Kymberly Blomberg LPN

## 2018-03-24 NOTE — Progress Notes (Signed)
Okay to treat with today's labs per Dr. Benay Spice after collaborating with pharmacy. Will send message to Dr. Burr Medico for follow up MD appointment.

## 2018-03-26 ENCOUNTER — Other Ambulatory Visit: Payer: Self-pay | Admitting: Hematology

## 2018-04-09 ENCOUNTER — Encounter: Payer: Self-pay | Admitting: Hematology

## 2018-04-10 ENCOUNTER — Encounter: Payer: Self-pay | Admitting: Hematology

## 2018-04-11 ENCOUNTER — Inpatient Hospital Stay (HOSPITAL_BASED_OUTPATIENT_CLINIC_OR_DEPARTMENT_OTHER): Payer: 59 | Admitting: Hematology

## 2018-04-11 ENCOUNTER — Inpatient Hospital Stay: Payer: 59

## 2018-04-11 ENCOUNTER — Encounter: Payer: Self-pay | Admitting: Hematology

## 2018-04-11 ENCOUNTER — Encounter (HOSPITAL_COMMUNITY): Payer: Self-pay

## 2018-04-11 ENCOUNTER — Other Ambulatory Visit: Payer: Self-pay | Admitting: Hematology

## 2018-04-11 ENCOUNTER — Ambulatory Visit (HOSPITAL_COMMUNITY)
Admission: RE | Admit: 2018-04-11 | Discharge: 2018-04-11 | Disposition: A | Discharge status: Home / Self Care | Payer: 59 | Source: Ambulatory Visit | Attending: Hematology | Admitting: Hematology

## 2018-04-11 ENCOUNTER — Ambulatory Visit (HOSPITAL_COMMUNITY)
Admission: RE | Admit: 2018-04-11 | Discharge: 2018-04-11 | Disposition: A | Discharge status: Home / Self Care | Payer: 59 | Source: Ambulatory Visit | Attending: Nurse Practitioner | Admitting: Nurse Practitioner

## 2018-04-11 DIAGNOSIS — C3490 Malignant neoplasm of unspecified part of unspecified bronchus or lung: Secondary | ICD-10-CM | POA: Diagnosis present

## 2018-04-11 DIAGNOSIS — Z9221 Personal history of antineoplastic chemotherapy: Secondary | ICD-10-CM

## 2018-04-11 DIAGNOSIS — C3431 Malignant neoplasm of lower lobe, right bronchus or lung: Secondary | ICD-10-CM | POA: Diagnosis not present

## 2018-04-11 DIAGNOSIS — R071 Chest pain on breathing: Secondary | ICD-10-CM

## 2018-04-11 DIAGNOSIS — M549 Dorsalgia, unspecified: Secondary | ICD-10-CM

## 2018-04-11 DIAGNOSIS — R110 Nausea: Secondary | ICD-10-CM

## 2018-04-11 DIAGNOSIS — Z923 Personal history of irradiation: Secondary | ICD-10-CM

## 2018-04-11 DIAGNOSIS — R51 Headache: Secondary | ICD-10-CM | POA: Diagnosis not present

## 2018-04-11 DIAGNOSIS — Z79899 Other long term (current) drug therapy: Secondary | ICD-10-CM

## 2018-04-11 DIAGNOSIS — F329 Major depressive disorder, single episode, unspecified: Secondary | ICD-10-CM

## 2018-04-11 DIAGNOSIS — F419 Anxiety disorder, unspecified: Secondary | ICD-10-CM

## 2018-04-11 DIAGNOSIS — G8929 Other chronic pain: Secondary | ICD-10-CM | POA: Diagnosis not present

## 2018-04-11 DIAGNOSIS — Z7982 Long term (current) use of aspirin: Secondary | ICD-10-CM

## 2018-04-11 LAB — CBC WITH DIFFERENTIAL (CANCER CENTER ONLY)
Abs Immature Granulocytes: 0.02 10*3/uL (ref 0.00–0.07)
Basophils Absolute: 0 10*3/uL (ref 0.0–0.1)
Basophils Relative: 0 %
Eosinophils Absolute: 0.2 10*3/uL (ref 0.0–0.5)
Eosinophils Relative: 3 %
HEMATOCRIT: 33.9 % — AB (ref 36.0–46.0)
Hemoglobin: 10.7 g/dL — ABNORMAL LOW (ref 12.0–15.0)
IMMATURE GRANULOCYTES: 0 %
Lymphocytes Relative: 15 %
Lymphs Abs: 1.1 10*3/uL (ref 0.7–4.0)
MCH: 30.4 pg (ref 26.0–34.0)
MCHC: 31.6 g/dL (ref 30.0–36.0)
MCV: 96.3 fL (ref 80.0–100.0)
MONOS PCT: 12 %
Monocytes Absolute: 1 10*3/uL (ref 0.1–1.0)
Neutro Abs: 5.4 10*3/uL (ref 1.7–7.7)
Neutrophils Relative %: 70 %
Platelet Count: 504 10*3/uL — ABNORMAL HIGH (ref 150–400)
RBC: 3.52 MIL/uL — ABNORMAL LOW (ref 3.87–5.11)
RDW: 13.8 % (ref 11.5–15.5)
WBC Count: 7.7 10*3/uL (ref 4.0–10.5)
nRBC: 0 % (ref 0.0–0.2)

## 2018-04-11 LAB — CMP (CANCER CENTER ONLY)
ALT: 24 U/L (ref 0–44)
AST: 20 U/L (ref 15–41)
Albumin: 2.4 g/dL — ABNORMAL LOW (ref 3.5–5.0)
Alkaline Phosphatase: 289 U/L — ABNORMAL HIGH (ref 38–126)
Anion gap: 8 (ref 5–15)
BUN: 6 mg/dL (ref 6–20)
CO2: 30 mmol/L (ref 22–32)
Calcium: 8.6 mg/dL — ABNORMAL LOW (ref 8.9–10.3)
Chloride: 101 mmol/L (ref 98–111)
Creatinine: 0.58 mg/dL (ref 0.44–1.00)
GFR, Est AFR Am: >60 mL/min (ref >60)
Glucose, Bld: 95 mg/dL (ref 70–99)
Potassium: 3.6 mmol/L (ref 3.5–5.1)
Sodium: 139 mmol/L (ref 135–145)
Total Bilirubin: <0.2 mg/dL — ABNORMAL LOW (ref 0.3–1.2)
Total Protein: 6.8 g/dL (ref 6.5–8.1)

## 2018-04-11 LAB — TOTAL PROTEIN, URINE DIPSTICK: Protein, ur: <30 mg/dL — AB

## 2018-04-11 MED ORDER — IOPAMIDOL (ISOVUE-370) INJECTION 76%
100.0000 mL | Freq: Once | INTRAVENOUS | Status: AC | PRN
  Administered 2018-04-11: 75 mL via INTRAVENOUS

## 2018-04-11 MED ORDER — SODIUM CHLORIDE (PF) 0.9 % IJ SOLN
INTRAMUSCULAR | Status: AC
  Filled 2018-04-11: qty 50

## 2018-04-11 MED ORDER — HYDROCODONE-HOMATROPINE 5-1.5 MG/5ML PO SYRP: 5.0000 mL | ORAL_SOLUTION | Freq: Four times a day (QID) | ORAL | 0 refills | Status: DC | PRN

## 2018-04-11 MED ORDER — HEPARIN SOD (PORK) LOCK FLUSH 100 UNIT/ML IV SOLN: 500.0000 [IU] | Freq: Once | INTRAVENOUS | Status: DC

## 2018-04-11 MED ORDER — HYDROCODONE-ACETAMINOPHEN 5-325 MG PO TABS: 1.0000 | ORAL_TABLET | Freq: Four times a day (QID) | ORAL | 0 refills | Status: DC | PRN

## 2018-04-11 MED ORDER — IOPAMIDOL (ISOVUE-370) INJECTION 76%
INTRAVENOUS | Status: AC
  Filled 2018-04-11: qty 100

## 2018-04-11 MED ORDER — SODIUM CHLORIDE 0.9% FLUSH
10.0000 mL | INTRAVENOUS | Status: DC | PRN
  Filled 2018-04-11: qty 10

## 2018-04-11 MED ORDER — HEPARIN SOD (PORK) LOCK FLUSH 100 UNIT/ML IV SOLN
500.0000 [IU] | Freq: Once | INTRAVENOUS | Status: DC
  Filled 2018-04-11: qty 5

## 2018-04-11 NOTE — Progress Notes (Signed)
[place]   [phone] Fax:(336) [phone]   Clinic Follow up Note   Patient Care Team: Redmond School, MD as PCP - General (Internal Medicine) 04/11/2018  CHIEF COMPLAINT: Right pleuritic chest pain   SUMMARY OF ONCOLOGIC HISTORY: Oncology History   Cancer Staging Metastatic non-small cell lung cancer [facility]) Staging form: Lung, AJCC 8th Edition - Clinical stage from 10/17/2017: Stage IV (cT3, cN1, cM1b) - Signed by Truitt Merle, MD on 10/17/2017       Metastatic non-small cell lung cancer ([place])   09/20/2017 Imaging    US Breast Left 09/20/17  IMPRESSION Indeterminte palpable mass measuring 3.2x1.7x2.8 cm  inferior medial to the inframammary fold of the left breast.      09/20/2017 Initial Biopsy    Diagnosis 09/20/17 Soft Tissue Needle Core Biopsy, inferior medial to IMF - ADENOCARCINOMA. - SEE MICROSCOPIC DESCRIPTION.  Microscopic Comment Immunohistochemistry will be performed and reported as an addendum. (JDP:ah 09/23/17) ADDENDUM: Immunohistochemistry shows the tumor is strongly positive with cytokeratin AE1/AE3, cytokeratin 7 and shows moderate weak positivity with CDX-2. The tumor is negative with estrogen receptor, progesterone receptor, GCDFP, GATA-3, Napsin A, TTF-1, WT-1 and cytokeratin 20. The immunophenotype is consistent with metastatic carcinoma. The combination of CDX-2 and cytokeratin 7 positivity raises the possibility of an upper gastrointestinal primary. Clinical correlation is essential.    10/06/2017 Initial Diagnosis    Metastatic adenocarcinoma involving soft tissue with unknown primary site [facility])    10/10/2017 Procedure    Upper Endoscopy by Dr. Silverio Decamp 10/10/17  IMPRESSION - Normal esophagus. - Z-line regular, 35 cm from the incisors. - Gastric bypass with a normal-sized pouch and intact staple line. Gastrojejunal anastomosis characterized by healthy appearing mucosa. - No specimens collected.    10/12/2017 Imaging    CT  CAP WO Contrast 10/12/17  IMPRESSION: 7 cm central right lung mass involving the right hilum, with postobstructive collapse of the right middle lobe, highly suspicious for primary bronchogenic carcinoma. 5 cm masslike opacity in the superior segment of the right lower lobe may represent carcinoma or postobstructive pneumonitis. 3.3 cm soft tissue mass in the lower anterior chest wall soft tissues, suspicious for metastatic disease. No evidence of abdominal or pelvic metastatic disease.     10/14/2017 Imaging    MRI Brain 10/14/17  IMPRESSION: 1. No metastatic disease or acute intracranial abnormality. Normal MRI appearance of the brain. 2. 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      10/17/2017 Cancer Staging    Staging form: Lung, AJCC 8th Edition - Clinical stage from 10/17/2017: Stage IV (cT3, cN1, cM1b) - Signed by Truitt Merle, MD on 10/17/2017    10/24/2017 - 11/04/2017 Radiation Therapy     The Right lung mass was treated to 30 Gy in 10 fractions of 3 Gy    11/08/2017 -  Chemotherapy    -first line chemo with carboplatin, paclitaxel, Atezolizumab and bevacizumab every 3weeks starting 11/08/17.  -Switched to maintenance therapy with avastin and atezolizumab q3weeks on 03/24/18    01/10/2018 Imaging    01/10/2018 CT CA IMPRESSION: 1. Response to therapy of central right lower lobe lung mass. Re-expansion of the right middle lobe with significantly improved right lower lobe postobstructive pneumonitis. Residual geographic airspace and ground-glass opacity within the medial right lung, primarily favored to be radiation induced. 2. Near complete resolution of presternal soft tissue mass. 3. New right adrenal nodule since 10/11/2017, suspicious for metastatic disease. 4. Right-sided Port-A-Cath with probable nonocclusive thrombus along the catheter within the  right brachiocephalic vein. 5. Age advanced coronary artery atherosclerosis. Recommend assessment of coronary  risk factors and consideration of medical therapy. 6. Aortic atherosclerosis (ICD10-I70.0) and emphysema (ICD10-J43.9).    03/10/2018 Imaging    CT CAP W Contrast 03/10/18 IMPRESSION: 1. Radiation changes involving the right paramediastinal lung and right hilum but no findings suspicious for residual or recurrent tumor. 2. No evidence of pulmonary metastatic disease. Stable emphysematous changes and areas of pulmonary scarring. 3. Stable 11 mm right adrenal gland nodule. 4. No findings for metastatic disease involving the liver or bony structures.    03/24/2018 -  Chemotherapy    The patient had bevacizumab (AVASTIN) 1,300 mg in sodium chloride 0.9 % 100 mL chemo infusion, 15.5 mg/kg = 1,275 mg, Intravenous,  Once, 1 of 4 cycles Administration: 1,300 mg (03/24/2018) atezolizumab (TECENTRIQ) 1,200 mg in sodium chloride 0.9 % 250 mL chemo infusion, 1,200 mg, Intravenous, Once, 1 of 4 cycles Administration: 1,200 mg (03/24/2018)  for chemotherapy treatment.      CURRENT THERAPY maintenance therapy with avastin and atezolizumab q3weeks started on 03/24/18   INTERVAL HISTORY: Yvonne Lang is a 53 y.o. female who is here for follow-up. Today, she is here with her family member. She complains of right chest pain that is worsened by breathing and coughing. It started suddenly 2 days ago, no injury. The pain is located on her right lower mid axillary area and is sharp and knife like in nature. She denies sputum, fever, or chills. She currently takes pain medications. She is short of breath and has an occasional dry cough.   Pertinent positives and negatives of review of systems are listed and detailed within the above HPI.  REVIEW OF SYSTEMS:   Constitutional: Denies fevers, chills or abnormal weight loss Eyes: Denies blurriness of vision Ears, nose, mouth, throat, and face: Denies mucositis or sore throat Respiratory: Denies or wheezes (+) pleuritic chest pain on the right lower mid axillary  area (+) dyspnea due to CP Cardiovascular: Denies palpitation, chest discomfort or lower extremity swelling Gastrointestinal:  Denies nausea, heartburn or change in bowel habits Skin: Denies abnormal skin rashes Lymphatics: Denies new lymphadenopathy or easy bruising Neurological:Denies numbness, tingling or new weaknesses Behavioral/Psych: Mood is stable, no new changes  All other systems were reviewed with the patient and are negative.  MEDICAL HISTORY:  Past Medical History:  Diagnosis Date  . Anxiety   . Bronchitis   . COPD (chronic obstructive pulmonary disease) ([place])   . Depression   . GERD (gastroesophageal reflux disease)   . H/O gastric bypass    1999  . Irritable bowel syndrome   . Lung cancer [facility])     SURGICAL HISTORY: Past Surgical History:  Procedure Laterality Date  . BACK SURGERY    . CHOLECYSTECTOMY    . COLON SURGERY     for a kink in her colon and 12 inchs removed  . COLONOSCOPY N/A 12/13/2013   Procedure: COLONOSCOPY;  Surgeon: Rogene Houston, MD;  Location: [facility];  Service: Endoscopy;  Laterality: N/A;  200  . ESOPHAGOGASTRODUODENOSCOPY N/A 12/13/2013   Procedure: ESOPHAGOGASTRODUODENOSCOPY (EGD);  Surgeon: Rogene Houston, MD;  Location: [facility];  Service: Endoscopy;  Laterality: N/A;  . Gastric Bypas  2000  . IR IMAGING GUIDED PORT INSERTION  11/14/2017    I have reviewed the social history and family history with the patient and they are unchanged from previous note.  ALLERGIES:  is allergic to codeine.  MEDICATIONS:  Current Outpatient  Medications  Medication Sig Dispense Refill  . albuterol (PROVENTIL HFA) 108 (90 Base) MCG/ACT inhaler Inhale 2 puffs into the lungs 2 (two) times daily. 1 Inhaler 2  . albuterol (PROVENTIL) 4 MG tablet Take 4 mg by mouth 2 (two) times daily.     Marland Kitchen ALPRAZolam (XANAX) 0.25 MG tablet Take 1 tablet (0.25 mg total) by mouth at bedtime as needed for anxiety. 30 tablet 0  . amoxicillin-clavulanate  (AUGMENTIN) 875-125 MG tablet Take 1 tablet by mouth 2 (two) times daily. 14 tablet 0  . aspirin 81 MG tablet Take 81 mg by mouth daily.    . butalbital-acetaminophen-caffeine (FIORICET, ESGIC) 50-325-40 MG tablet Take 1-2 tablets by mouth every 6 (six) hours as needed for headache. 60 tablet 1  . CALCIUM PO Take by mouth daily.    Marland Kitchen dicyclomine (BENTYL) 20 MG tablet Take 1 tablet (20 mg total) by mouth every 6 (six) hours. 60 tablet 1  . DULoxetine (CYMBALTA) 60 MG capsule Take 60 mg by mouth daily.    Marland Kitchen gabapentin (NEURONTIN) 300 MG capsule TAKE 1 CAPSULE BY MOUTH 4 TIMES DAILY  4  . HYDROcodone-acetaminophen (NORCO) 5-325 MG tablet Take 1 tablet by mouth every 6 (six) hours as needed for moderate pain. 20 tablet 0  . HYDROcodone-homatropine (HYCODAN) 5-1.5 MG/5ML syrup Take 5 mLs by mouth every 6 (six) hours as needed for cough. 120 mL 0  . ipratropium-albuterol (DUONEB) 0.5-2.5 (3) MG/3ML SOLN Take 3 mLs by nebulization every 4 (four) hours as needed. 360 mL 1  . lidocaine-prilocaine (EMLA) cream Apply 1 application topically as needed. Apply to port one hour prior to port access/chemo 30 g 2  . magic mouthwash w/lidocaine SOLN Take 5 mLs by mouth 3 (three) times daily. Swish and spit 3 x daily 240 mL 0  . magic mouthwash w/lidocaine SOLN Take 5 mLs by mouth 3 (three) times daily. 100 mL 0  . mirtazapine (REMERON) 7.5 MG tablet Take 1 tablet (7.5 mg total) by mouth at bedtime. 30 tablet 1  . Multiple Vitamin (MULTIVITAMIN) tablet Take 1 tablet by mouth daily.    Marland Kitchen omeprazole (PRILOSEC) 20 MG capsule Take 1 capsule (20 mg total) by mouth 2 (two) times daily before a meal. 60 capsule 0  . ondansetron (ZOFRAN) 8 MG tablet TAKE 1 TABLET BY MOUTH EVERY 8 HOURS AS NEEDED FOR NAUSEA AND VOMITING 30 tablet 1  . potassium chloride SA (K-DUR,KLOR-CON) 20 MEQ tablet Take 1 tablet (20 mEq total) by mouth daily. 60 tablet 0  . prochlorperazine (COMPAZINE) 10 MG tablet Take 1 tablet (10 mg total) by mouth  every 6 (six) hours as needed for nausea or vomiting. 30 tablet 0  . prochlorperazine (COMPAZINE) 10 MG tablet TAKE 1 TABLET BY MOUTH EVERY 6 HOURS AS NEEDED FOR NAUSEA AND VOMITING 30 tablet 2  . RaNITidine HCl (ZANTAC PO) Take by mouth 3 (three) times daily.    Marland Kitchen rOPINIRole (REQUIP) 0.5 MG tablet TAKE 1 TABLET BY MOUTH AT BEDTIME FOR 30 DAYS  11  . sucralfate (CARAFATE) 1 g tablet TAKE 1 TABLET(1 GRAM) BY MOUTH FOUR TIMES DAILY 120 tablet 0  . traMADol (ULTRAM) 50 MG tablet Take 1 tablet (50 mg total) by mouth every 6 (six) hours as needed. 90 tablet 0  . TRELEGY ELLIPTA 100-62.5-25 MCG/INH AEPB INHALE 1 PUFF INTO THE LUNGS EVERY DAY  5  . UNABLE TO FIND Inhale into the lungs. Med Name: Trilogy as needed     Current Facility-Administered Medications  Medication Dose Route Frequency Provider Last Rate Last Dose  . heparin lock flush 100 unit/mL  500 Units Intravenous Once Truitt Merle, MD      . sodium chloride flush (NS) 0.9 % injection 10 mL  10 mL Intravenous PRN Truitt Merle, MD       Facility-Administered Medications Ordered in Other Visits  Medication Dose Route Frequency Provider Last Rate Last Dose  . heparin lock flush 100 unit/mL  500 Units Intravenous Once Truitt Merle, MD      . iopamidol (ISOVUE-370) 76 % injection           . sodium chloride (PF) 0.9 % injection             PHYSICAL EXAMINATION: ECOG PERFORMANCE STATUS: 2 - Symptomatic, <50% confined to bed  Vitals:   04/11/18 1414  BP: 105/76  Pulse: (!) 107  Resp: 18  Temp: 98.9 F (37.2 C)  SpO2: 98%   Filed Weights   04/11/18 1414  Weight: 183 lb (83 kg)    GENERAL:alert, no distress and comfortable SKIN: skin color, texture, turgor are normal, no rashes or significant lesions EYES: normal, Conjunctiva are pink and non-injected, sclera clear OROPHARYNX:no exudate, no erythema and lips, buccal mucosa, and tongue normal  NECK: supple, thyroid normal size, non-tender, without nodularity LYMPH:  no palpable  lymphadenopathy in the cervical, axillary or inguinal LUNGS: clear to auscultation and percussion with normal breathing effort HEART: regular rate & rhythm and no murmurs and no lower extremity edema ABDOMEN:abdomen soft, non-tender and normal bowel sounds Musculoskeletal:no cyanosis of digits and no clubbing  NEURO: alert & oriented x 3 with fluent speech, no focal motor/sensory deficits  LABORATORY DATA:  I have reviewed the data as listed CBC Latest Ref Rng & Units 04/11/2018 03/24/2018 03/02/2018  WBC 4.0 - 10.5 K/uL 7.7 7.4 7.7  Hemoglobin 12.0 - 15.0 g/dL 10.7(L) 11.0(L) 10.8(L)  Hematocrit 36.0 - 46.0 % 33.9(L) 34.9(L) 34.1(L)  Platelets 150 - 400 K/uL 504(H) 422(H) 464(H)     CMP Latest Ref Rng & Units 04/11/2018 03/24/2018 03/02/2018  Glucose 70 - 99 mg/dL 95 100(H) 102(H)  BUN 6 - 20 mg/dL 6 5(L) 5(L)  Creatinine 0.44 - 1.00 mg/dL 0.58 0.59 0.62  Sodium 135 - 145 mmol/L 139 139 143  Potassium 3.5 - 5.1 mmol/L 3.6 3.9 4.0  Chloride 98 - 111 mmol/L 101 102 104  CO2 22 - 32 mmol/L 30 28 29   Calcium 8.9 - 10.3 mg/dL 8.6(L) 9.0 9.0  Total Protein 6.5 - 8.1 g/dL 6.8 7.2 6.9  Total Bilirubin 0.3 - 1.2 mg/dL <0.2(L) <0.2(L) 0.3  Alkaline Phos 38 - 126 U/L 289(H) 321(H) 304(H)  AST 15 - 41 U/L 20 82(H) 38  ALT 0 - 44 U/L 24 61(H) 37     RADIOGRAPHIC STUDIES: I have personally reviewed the radiological images as listed and agreed with the findings in the report. Ct Angio Chest Pe W Or Wo Contrast  Result Date: 04/11/2018 CLINICAL DATA:  Shortness of breath for 3 days. History of right lung cancer. Chemotherapy ongoing. EXAM: CT ANGIOGRAPHY CHEST WITH CONTRAST TECHNIQUE: Multidetector CT imaging of the chest was performed using the standard protocol during bolus administration of intravenous contrast. Multiplanar CT image reconstructions and MIPs were obtained to evaluate the vascular anatomy. CONTRAST:  66mL ISOVUE-370 IOPAMIDOL (ISOVUE-370) INJECTION 76% COMPARISON:  Chest CT  03/10/2018 and 01/10/2018. FINDINGS: Cardiovascular: The pulmonary arteries are well opacified with contrast to the level of the subsegmental branches. There is  no evidence of acute pulmonary embolism. Right IJ Port-A-Cath extends into the mid right atrium. Minimal atherosclerosis of the aorta and coronary arteries. The heart size is normal. There is no pericardial effusion. Mediastinum/Nodes: A right paratracheal node has mildly enlarged, measuring 14 mm short axis on image 28/4 (previously 10 mm). Additional small mediastinal and right hilar lymph nodes are stable. There is no axillary adenopathy. The thyroid gland, trachea and esophagus demonstrate no significant findings. Lungs/Pleura: There is a new small dependent right pleural effusion with associated mild dependent right lung atelectasis. There are progressive radiation changes in the right perihilar region, extending into the right middle lobe. Interval enlargement of the cavitary retro hilar right lung mass, measuring 4.6 x 3.6 cm on image 54/6. This cavitary mass communicates with the bronchus intermedius. Patchy left basilar atelectasis has also mildly increased. No other focal nodules identified. Upper abdomen: Right adrenal nodule has enlarged, measuring 2.0 x 1.7 cm on image 94/4. This was not present on the original CT from 10/11/2017 and is suspicious for metastatic disease. The visualized left adrenal gland and remainder of the upper abdomen appear stable. There are postsurgical changes in the left subphrenic area. Musculoskeletal/Chest wall: There is no chest wall mass or suspicious osseous finding. Review of the MIP images confirms the above findings. IMPRESSION: 1. No evidence of acute pulmonary embolism. 2. Progressive radiation changes in the right perihilar region. Cavitary retro hilar mass and right paratracheal lymph node have enlarged, suspicious for local recurrence/disease progression. 3. Enlarging right adrenal nodule suspicious for  metastatic disease. 4. New right pleural effusion with increased atelectasis at both lung bases. 5. These results will be called to the ordering clinician or representative by the Radiology Department at the imaging location. Electronically Signed   By: Richardean Sale M.D.   On: 04/11/2018 16:36     ASSESSMENT & PLAN:  Zeena Starkel is a 53 y.o. female with history of  1. Pleuritic right chest pain -located on the right lower mid axillary area, which is sharp and worsened with coughing and deep breathes. It started 2 days ago. She denies fever, chills, and productive cough. -I discussed possibility of PE and pleuritis. Will order CT Chest with contrast. I also prescribed Norco and Hycodan for her pain and cough.  2. 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 -Diagnosed in 09/2017. Treated with chemo and radiation. She was initially on first linecarboplatin, paclitaxel, Atezolizumab and bevacizumab every 3weeks s/p 6 cycles Currently on maintenance therapy with avastin and atezolizumab q3weeks started on 03/24/18.  -She is experiencing pleuritic chest pain. Pain is worsened with deep breaths and coughing. It is sharp and located on the lower mid axillary area. I discussed possibility of pulmonary embolism and pleurisy. I will prescribe Norco and Hycodan for the pain and order a CT chest with contrast.  -Labs reviewed, CBC showed Hg 10.7 PLT 504K.  3. Frontal Headaches -No evidence of brain metastasis. -She was previously evaluated by Dr. Mickeal Skinner, who thought the headaches were related to overuse of Tramadol and Fioricet. -f/u with Dr. Mickeal Skinner  4. Anxiety and Depression -Currently on Xanax and Cymbalta. Continue.  5. Chronic back pain -Currently on Tramadol 50 mg q6Hr. Continue.  6. Low appetite, Nausea -Currently on Mirtazapine, omeprazole, ondansetron, prochlorperazine, and sucralfate. Continue. -stable overall   7.Goal of care discussion  -The patient  understands the goal of care is palliative. -she is full code now  Plan  -I prescribed Norco and Hycodan -I ordered stat  CT Chest with PE protocol to r/u PE  No problem-specific Assessment & Plan notes found for this encounter.   No orders of the defined types were placed in this encounter.  All questions were answered. The patient knows to call the clinic with any problems, questions or concerns. No barriers to learning was detected. I spent 20 minutes counseling the patient face to face. The total time spent in the appointment was 25 minutes and more than 50% was on counseling and review of test results  I, Noor Dweik am acting as scribe for Dr. Truitt Merle.  I have reviewed the above documentation for accuracy and completeness, and I agree with the above.     Truitt Merle, MD 04/11/2018  Addendum Her CT chest was negative for PE, unfortunately it showed enlarging right adrenal nodule, suspicious for metastatic disease, new right pleural effusion with enlarging right hilar adenopathy, suspicious for disease progression.  I will get a PET scan for further evaluation, and possible change treatment if PET scan confirms disease progression.   Truitt Merle  04/11/2018

## 2018-04-12 ENCOUNTER — Encounter: Payer: Self-pay | Admitting: Hematology

## 2018-04-12 ENCOUNTER — Telehealth: Payer: Self-pay | Admitting: Hematology

## 2018-04-12 NOTE — Telephone Encounter (Signed)
No los per 01/21.

## 2018-04-13 ENCOUNTER — Telehealth: Payer: Self-pay | Admitting: Hematology

## 2018-04-13 ENCOUNTER — Encounter: Payer: Self-pay | Admitting: Hematology

## 2018-04-13 ENCOUNTER — Other Ambulatory Visit: Payer: Self-pay | Admitting: Hematology

## 2018-04-13 NOTE — Telephone Encounter (Signed)
Called patient per 1/22 sch message - unable to reach patient - left message with appt date and time

## 2018-04-14 ENCOUNTER — Inpatient Hospital Stay: Payer: 59

## 2018-04-14 ENCOUNTER — Inpatient Hospital Stay: Payer: 59 | Admitting: Hematology

## 2018-04-14 ENCOUNTER — Telehealth: Payer: Self-pay | Admitting: Hematology

## 2018-04-14 NOTE — Telephone Encounter (Signed)
Faxed medical records to [place], Release [phone]

## 2018-04-18 ENCOUNTER — Other Ambulatory Visit: Payer: Self-pay | Admitting: Hematology

## 2018-04-18 DIAGNOSIS — C3490 Malignant neoplasm of unspecified part of unspecified bronchus or lung: Secondary | ICD-10-CM

## 2018-04-20 NOTE — Telephone Encounter (Signed)
Alprazolam, Ondansetron refills requested.  Next treatment and F/U scheduled 04-25-2018.

## 2018-04-21 ENCOUNTER — Encounter (HOSPITAL_COMMUNITY)
Admission: RE | Admit: 2018-04-21 | Discharge: 2018-04-21 | Disposition: A | Discharge status: Home / Self Care | Payer: 59 | Source: Ambulatory Visit | Attending: Hematology | Admitting: Hematology

## 2018-04-21 ENCOUNTER — Encounter: Payer: Self-pay | Admitting: Hematology

## 2018-04-21 DIAGNOSIS — R071 Chest pain on breathing: Secondary | ICD-10-CM | POA: Insufficient documentation

## 2018-04-21 LAB — GLUCOSE, CAPILLARY: Glucose-Capillary: 110 mg/dL — ABNORMAL HIGH (ref 70–99)

## 2018-04-21 MED ORDER — FLUDEOXYGLUCOSE F - 18 (FDG) INJECTION
8.8000 | Freq: Once | INTRAVENOUS | Status: AC | PRN
  Administered 2018-04-21: 8.8 via INTRAVENOUS

## 2018-04-22 ENCOUNTER — Other Ambulatory Visit: Payer: Self-pay | Admitting: Hematology

## 2018-04-22 NOTE — Progress Notes (Signed)
DISCONTINUE ON PATHWAY REGIMEN - Non-Small Cell Lung     A cycle is every 21 days:     Atezolizumab      Bevacizumab-xxxx      Paclitaxel      Carboplatin   **Always confirm dose/schedule in your pharmacy ordering system**  REASON: Disease Progression PRIOR TREATMENT: [id number]: Atezolizumab 1,200 mg + Bevacizumab 15 mg/kg + Paclitaxel 200 mg/m2 + Carboplatin AUC=6 q21 Days x 4-6 Cycles TREATMENT RESPONSE: Partial Response (PR)  START ON PATHWAY REGIMEN - Non-Small Cell Lung     A cycle is every 21 days:     Pemetrexed   **Always confirm dose/schedule in your pharmacy ordering system**  Patient Characteristics: Stage IV Metastatic, Nonsquamous, Second Line - Chemotherapy/Immunotherapy, PS = 0, 1, No Prior PD-1/PD-L1  Inhibitor or Prior PD-1/PD-L1 Inhibitor + Chemotherapy, and Not a Candidate for Immunotherapy AJCC T Category: T3 Current Disease Status: Distant Metastases AJCC N Category: N1 AJCC M Category: M1b AJCC 8 Stage Grouping: IVA Histology: Nonsquamous Cell ROS1 Rearrangement Status: Negative T790M Mutation Status: Not Applicable - EGFR Mutation Negative/Unknown Other Mutations/Biomarkers: No Other Actionable Mutations NTRK Gene Fusion Status: Negative PD-L1 Expression Status: PD-L1 Negative Chemotherapy/Immunotherapy LOT: Second Line Chemotherapy/Immunotherapy Molecular Targeted Therapy: Not Appropriate ALK Translocation Status: Negative EGFR Mutation Status: Negative/Wild Type BRAF V600E Mutation Status: Negative Performance Status: PS = 0, 1 Immunotherapy Candidate Status: Not a Candidate for Immunotherapy Prior Immunotherapy Status: Prior PD-1/PD-L1 Inhibitor + Chemotherapy Intent of Therapy: Non-Curative / Palliative Intent, Discussed with Patient

## 2018-04-24 ENCOUNTER — Telehealth: Payer: Self-pay

## 2018-04-24 NOTE — Telephone Encounter (Signed)
Spoke with patient regarding Patient Message she had sent, grey phlegm, she states it was just one time, has not had any further episodes, no fever, no chills.  Patient has appointment with Dr. Burr Medico tomorrow 2/4.

## 2018-04-24 NOTE — Progress Notes (Signed)
[place]   [phone] Fax:([phone]   Clinic Follow up Note   Patient Care Team: Redmond School, MD as PCP - General (Internal Medicine) 04/25/2018  CHIEF COMPLAINT: F/u lung cancer   SUMMARY OF ONCOLOGIC HISTORY: Oncology History   Cancer Staging Metastatic non-small cell lung cancer [facility]) Staging form: Lung, AJCC 8th Edition - Clinical stage from 10/17/2017: Stage IV (cT3, cN1, cM1b) - Signed by Truitt Merle, MD on 10/17/2017       Metastatic non-small cell lung cancer ([place])   09/20/2017 Imaging    US Breast Left 09/20/17  IMPRESSION Indeterminte palpable mass measuring 3.2x1.7x2.8 cm  inferior medial to the inframammary fold of the left breast.      09/20/2017 Initial Biopsy    Diagnosis 09/20/17 Soft Tissue Needle Core Biopsy, inferior medial to IMF - ADENOCARCINOMA. - SEE MICROSCOPIC DESCRIPTION.  Microscopic Comment Immunohistochemistry will be performed and reported as an addendum. (JDP:ah 09/23/17) ADDENDUM: Immunohistochemistry shows the tumor is strongly positive with cytokeratin AE1/AE3, cytokeratin 7 and shows moderate weak positivity with CDX-2. The tumor is negative with estrogen receptor, progesterone receptor, GCDFP, GATA-3, Napsin A, TTF-1, WT-1 and cytokeratin 20. The immunophenotype is consistent with metastatic carcinoma. The combination of CDX-2 and cytokeratin 7 positivity raises the possibility of an upper gastrointestinal primary. Clinical correlation is essential.    10/06/2017 Initial Diagnosis    Metastatic adenocarcinoma involving soft tissue with unknown primary site [facility])    10/10/2017 Procedure    Upper Endoscopy by Dr. Silverio Decamp 10/10/17  IMPRESSION - Normal esophagus. - Z-line regular, 35 cm from the incisors. - Gastric bypass with a normal-sized pouch and intact staple line. Gastrojejunal anastomosis characterized by healthy appearing mucosa. - No specimens collected.    10/12/2017 Imaging    CT CAP WO Contrast  10/12/17  IMPRESSION: 7 cm central right lung mass involving the right hilum, with postobstructive collapse of the right middle lobe, highly suspicious for primary bronchogenic carcinoma. 5 cm masslike opacity in the superior segment of the right lower lobe may represent carcinoma or postobstructive pneumonitis. 3.3 cm soft tissue mass in the lower anterior chest wall soft tissues, suspicious for metastatic disease. No evidence of abdominal or pelvic metastatic disease.     10/14/2017 Imaging    MRI Brain 10/14/17  IMPRESSION: 1. No metastatic disease or acute intracranial abnormality. Normal MRI appearance of the brain. 2. 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      10/17/2017 Cancer Staging    Staging form: Lung, AJCC 8th Edition - Clinical stage from 10/17/2017: Stage IV (cT3, cN1, cM1b) - Signed by Truitt Merle, MD on 10/17/2017    10/24/2017 - 11/04/2017 Radiation Therapy     The Right lung mass was treated to 30 Gy in 10 fractions of 3 Gy    11/08/2017 -  Chemotherapy    -first line chemo with carboplatin, paclitaxel, Atezolizumab and bevacizumab every 3weeks starting 11/08/17.  -Switched to maintenance therapy with avastin and atezolizumab q3weeks on 03/24/18    01/10/2018 Imaging    01/10/2018 CT CA IMPRESSION: 1. Response to therapy of central right lower lobe lung mass. Re-expansion of the right middle lobe with significantly improved right lower lobe postobstructive pneumonitis. Residual geographic airspace and ground-glass opacity within the medial right lung, primarily favored to be radiation induced. 2. Near complete resolution of presternal soft tissue mass. 3. New right adrenal nodule since 10/11/2017, suspicious for metastatic disease. 4. Right-sided Port-A-Cath with probable nonocclusive thrombus along the catheter within the right  brachiocephalic vein. 5. Age advanced coronary artery atherosclerosis. Recommend assessment of coronary risk factors and  consideration of medical therapy. 6. Aortic atherosclerosis (ICD10-I70.0) and emphysema (ICD10-J43.9).    03/10/2018 Imaging    CT CAP W Contrast 03/10/18 IMPRESSION: 1. Radiation changes involving the right paramediastinal lung and right hilum but no findings suspicious for residual or recurrent tumor. 2. No evidence of pulmonary metastatic disease. Stable emphysematous changes and areas of pulmonary scarring. 3. Stable 11 mm right adrenal gland nodule. 4. No findings for metastatic disease involving the liver or bony structures.    03/24/2018 - 04/24/2018 Chemotherapy    The patient had bevacizumab (AVASTIN) 1,300 mg in sodium chloride 0.9 % 100 mL chemo infusion, 15.5 mg/kg = 1,275 mg, Intravenous,  Once, 1 of 4 cycles Administration: 1,300 mg (03/24/2018) atezolizumab (TECENTRIQ) 1,200 mg in sodium chloride 0.9 % 250 mL chemo infusion, 1,200 mg, Intravenous, Once, 1 of 4 cycles Administration: 1,200 mg (03/24/2018)  for chemotherapy treatment.     04/11/2018 Imaging    CT ANGIO CHEST PE W OR WO CONTRAST  IMPRESSION: 1. No evidence of acute pulmonary embolism. 2. Progressive radiation changes in the right perihilar region. Cavitary retro hilar mass and right paratracheal lymph node have enlarged, suspicious for local recurrence/disease progression. 3. Enlarging right adrenal nodule suspicious for metastatic disease. 4. New right pleural effusion with increased atelectasis at both lung bases. 5. These results will be called to the ordering clinician or representative by the Radiology Department at the imaging location.     04/21/2018 Imaging    NUCLEAR MEDICINE PET SKULL BASE TO THIGH IMPRESSION: 1. Marked hypermetabolism in the amorphous soft tissue involving the right hilum. This represents the central component of the apparent radiation scarring. 2. Hypermetabolic mediastinal nodal metastases with nodal metastases identified in the anterior right juxta diaphragmatic fat. 3.  Hypermetabolic right adrenal metastasis. 4. Interval progression of loculated right pleural effusion.     05/02/2018 -  Chemotherapy    The patient had PEMEtrexed (ALIMTA) 1,000 mg in sodium chloride 0.9 % 100 mL chemo infusion, 500 mg/m2 = 1,000 mg, Intravenous,  Once, 0 of 6 cycles  for chemotherapy treatment.      CURRENT THERAPY   maintenance therapy withavastin and atezolizumabq3weeks started on 03/24/18, will change to second line alimta 500mg /m2 every 3 weeks from next week    INTERVAL HISTORY: Primrose Oler is a 53 y.o. female who is here for follow-up. Since last seen she had a Nuclear medicine PET skull to thigh and CT angio chest  PE w or wo contrast. She called yesterday with c/o of grey phlegm. Today, she is here with family member.She has had a fever, lower energy and describes pain around lower lung. denies N/V/D. Pain is decreased with pain medication. She does not drink alcohol and took Tylenol for her fever.   Pertinent positives and negatives of review of systems are listed and detailed within the above HPI.  REVIEW OF SYSTEMS:  Constitutional: Denies chills or abnormal weight loss, (+) recent fever  Eyes: Denies blurriness of vision Ears, nose, mouth, throat, and face: Denies mucositis or sore throat Respiratory: Denies cough, dyspnea or wheezes. (+) pleuritic chest pain Cardiovascular: Denies palpitation, chest discomfort or lower extremity swelling Gastrointestinal:  Denies nausea, heartburn or change in bowel habits Skin: Denies abnormal skin rashes Lymphatics: Denies new lymphadenopathy or easy bruising Neurological:Denies numbness, tingling or new weaknesses Behavioral/Psych: Mood is stable, no new changes  All other systems were reviewed with the patient and  are negative.  MEDICAL HISTORY:  Past Medical History:  Diagnosis Date  . Anxiety   . Bronchitis   . COPD (chronic obstructive pulmonary disease) ([place])   . Depression   . GERD (gastroesophageal  reflux disease)   . H/O gastric bypass    1999  . Irritable bowel syndrome   . Lung cancer [facility])     SURGICAL HISTORY: Past Surgical History:  Procedure Laterality Date  . BACK SURGERY    . CHOLECYSTECTOMY    . COLON SURGERY     for a kink in her colon and 12 inchs removed  . COLONOSCOPY N/A 12/13/2013   Procedure: COLONOSCOPY;  Surgeon: Rogene Houston, MD;  Location: [facility];  Service: Endoscopy;  Laterality: N/A;  200  . ESOPHAGOGASTRODUODENOSCOPY N/A 12/13/2013   Procedure: ESOPHAGOGASTRODUODENOSCOPY (EGD);  Surgeon: Rogene Houston, MD;  Location: [facility] ENDO SUITE;  Service: Endoscopy;  Laterality: N/A;  . Gastric Bypas  2000  . IR IMAGING GUIDED PORT INSERTION  11/14/2017    I have reviewed the social history and family history with the patient and they are unchanged from previous note.  ALLERGIES:  is allergic to codeine.  MEDICATIONS:  Current Outpatient Medications  Medication Sig Dispense Refill  . albuterol (PROVENTIL HFA) 108 (90 Base) MCG/ACT inhaler Inhale 2 puffs into the lungs 2 (two) times daily. 1 Inhaler 2  . albuterol (PROVENTIL) 4 MG tablet Take 4 mg by mouth 2 (two) times daily.     Marland Kitchen ALPRAZolam (XANAX) 0.25 MG tablet TAKE 1 TABLET BY MOUTH AT BEDTIME AS NEEDED FOR ANXIETY 30 tablet 0  . amoxicillin-clavulanate (AUGMENTIN) 875-125 MG tablet Take 1 tablet by mouth 2 (two) times daily. 14 tablet 0  . aspirin 81 MG tablet Take 81 mg by mouth daily.    . butalbital-acetaminophen-caffeine (FIORICET, ESGIC) 50-325-40 MG tablet Take 1-2 tablets by mouth every 6 (six) hours as needed for headache. 60 tablet 1  . CALCIUM PO Take by mouth daily.    Marland Kitchen dicyclomine (BENTYL) 20 MG tablet Take 1 tablet (20 mg total) by mouth every 6 (six) hours. 60 tablet 1  . DULoxetine (CYMBALTA) 60 MG capsule Take 60 mg by mouth daily.    Marland Kitchen gabapentin (NEURONTIN) 300 MG capsule TAKE 1 CAPSULE BY MOUTH 4 TIMES DAILY  4  . HYDROcodone-acetaminophen (NORCO) 5-325 MG tablet Take 1 tablet  by mouth every 6 (six) hours as needed for moderate pain. 20 tablet 0  . HYDROcodone-homatropine (HYCODAN) 5-1.5 MG/5ML syrup Take 5 mLs by mouth every 6 (six) hours as needed for cough. 120 mL 0  . ipratropium-albuterol (DUONEB) 0.5-2.5 (3) MG/3ML SOLN Take 3 mLs by nebulization every 4 (four) hours as needed. 360 mL 1  . lidocaine-prilocaine (EMLA) cream Apply 1 application topically as needed. Apply to port one hour prior to port access/chemo 30 g 2  . magic mouthwash w/lidocaine SOLN Take 5 mLs by mouth 3 (three) times daily. Swish and spit 3 x daily 240 mL 0  . magic mouthwash w/lidocaine SOLN Take 5 mLs by mouth 3 (three) times daily. 100 mL 0  . mirtazapine (REMERON) 7.5 MG tablet Take 1 tablet (7.5 mg total) by mouth at bedtime. 30 tablet 1  . Multiple Vitamin (MULTIVITAMIN) tablet Take 1 tablet by mouth daily.    Marland Kitchen omeprazole (PRILOSEC) 20 MG capsule Take 1 capsule (20 mg total) by mouth 2 (two) times daily before a meal. 60 capsule 0  . ondansetron (ZOFRAN) 8 MG tablet TAKE 1  TABLET BY MOUTH EVERY 8 HOURS AS NEEDED FOR NAUSEA AND VOMITING 30 tablet 0  . potassium chloride SA (K-DUR,KLOR-CON) 20 MEQ tablet Take 1 tablet (20 mEq total) by mouth daily. 60 tablet 0  . prochlorperazine (COMPAZINE) 10 MG tablet Take 1 tablet (10 mg total) by mouth every 6 (six) hours as needed for nausea or vomiting. 30 tablet 0  . prochlorperazine (COMPAZINE) 10 MG tablet TAKE 1 TABLET BY MOUTH EVERY 6 HOURS AS NEEDED FOR NAUSEA AND VOMITING 30 tablet 2  . RaNITidine HCl (ZANTAC PO) Take by mouth 3 (three) times daily.    Marland Kitchen rOPINIRole (REQUIP) 0.5 MG tablet TAKE 1 TABLET BY MOUTH AT BEDTIME FOR 30 DAYS  11  . sucralfate (CARAFATE) 1 g tablet TAKE 1 TABLET(1 GRAM) BY MOUTH FOUR TIMES DAILY 120 tablet 0  . traMADol (ULTRAM) 50 MG tablet Take 1 tablet (50 mg total) by mouth every 6 (six) hours as needed. 90 tablet 0  . TRELEGY ELLIPTA 100-62.5-25 MCG/INH AEPB INHALE 1 PUFF INTO THE LUNGS EVERY DAY  5  . UNABLE TO  FIND Inhale into the lungs. Med Name: Trilogy as needed    . dexamethasone (DECADRON) 4 MG tablet Take 1 tab two times a day the day before Alimta chemo. Take 2 tabs the day after chemo, then take 2 tabs two times a day for 2 days. 30 tablet 1  . folic acid (FOLVITE) 1 MG tablet Take 1 tablet (1 mg total) by mouth daily. Start 5-7 days before Alimta chemotherapy. Continue until 21 days after Alimta completed. 100 tablet 3   No current facility-administered medications for this visit.     PHYSICAL EXAMINATION: ECOG PERFORMANCE STATUS: 2 - Symptomatic, <50% confined to bed  Vitals:   04/25/18 1227  BP: 113/77  Pulse: 88  Resp: 18  Temp: 97.7 F (36.5 C)  SpO2: 98%   Filed Weights   04/25/18 1227  Weight: 180 lb 3.2 oz (81.7 kg)    GENERAL:alert, no distress and comfortable SKIN: skin color, texture, turgor are normal, no rashes or significant lesions EYES: normal, Conjunctiva are pink and non-injected, sclera clear OROPHARYNX:no exudate, no erythema and lips, buccal mucosa, and tongue normal  NECK: supple, thyroid normal size, non-tender, without nodularity LYMPH:  no palpable lymphadenopathy in the cervical, axillary or inguinal LUNGS: clear to auscultation and percussion with normal breathing effort HEART: regular rate & rhythm and no murmurs and no lower extremity edema ABDOMEN:abdomen soft, non-tender and normal bowel sounds Musculoskeletal:no cyanosis of digits and no clubbing  NEURO: alert & oriented x 3 with fluent speech, no focal motor/sensory deficits  LABORATORY DATA:  I have reviewed the data as listed CBC Latest Ref Rng & Units 04/25/2018 04/11/2018 03/24/2018  WBC 4.0 - 10.5 K/uL 6.5 7.7 7.4  Hemoglobin 12.0 - 15.0 g/dL 9.7(L) 10.7(L) 11.0(L)  Hematocrit 36.0 - 46.0 % 30.7(L) 33.9(L) 34.9(L)  Platelets 150 - 400 K/uL 523(H) 504(H) 422(H)     CMP Latest Ref Rng & Units 04/25/2018 04/11/2018 03/24/2018  Glucose 70 - 99 mg/dL 103(H) 95 100(H)  BUN 6 - 20 mg/dL 4(L) 6  5(L)  Creatinine 0.44 - 1.00 mg/dL 0.52 0.58 0.59  Sodium 135 - 145 mmol/L 137 139 139  Potassium 3.5 - 5.1 mmol/L 2.9(LL) 3.6 3.9  Chloride 98 - 111 mmol/L 95(L) 101 102  CO2 22 - 32 mmol/L 34(H) 30 28  Calcium 8.9 - 10.3 mg/dL 8.5(L) 8.6(L) 9.0  Total Protein 6.5 - 8.1 g/dL 6.7 6.8 7.2  Total Bilirubin 0.3 - 1.2 mg/dL 0.6 <0.2(L) <0.2(L)  Alkaline Phos 38 - 126 U/L 938(H) 289(H) 321(H)  AST 15 - 41 U/L 662(HH) 20 82(H)  ALT 0 - 44 U/L 326(HH) 24 61(H)      RADIOGRAPHIC STUDIES: I have personally reviewed the radiological images as listed and agreed with the findings in the report. No results found.   04/21/2018 NUCLEAR MEDICINE PET SKULL BASE TO THIGH IMPRESSION: 1. Marked hypermetabolism in the amorphous soft tissue involving the right hilum. This represents the central component of the apparent radiation scarring. 2. Hypermetabolic mediastinal nodal metastases with nodal metastases identified in the anterior right juxta diaphragmatic fat. 3. Hypermetabolic right adrenal metastasis. 4. Interval progression of loculated right pleural effusion.   04/11/2018 CT ANGIO CHEST PE W OR WO CONTRAST  IMPRESSION: 1. No evidence of acute pulmonary embolism. 2. Progressive radiation changes in the right perihilar region. Cavitary retro hilar mass and right paratracheal lymph node have enlarged, suspicious for local recurrence/disease progression. 3. Enlarging right adrenal nodule suspicious for metastatic disease. 4. New right pleural effusion with increased atelectasis at both lung bases. 5. These results will be called to the ordering clinician or representative by the Radiology Department at the imaging location.   ASSESSMENT & PLAN:  Yvonne Lang is a 53 y.o. female with history of   1. 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 -Diagnosed in 09/2017. Treated with chemo and radiation. She was initially on first linecarboplatin,  paclitaxel, Atezolizumab and bevacizumab every 3weekss/p 6 cycles, with good partial response. Switched to maintenance therapy withavastin and atezolizumabq3weeks started on 03/24/18.  -He recently has developed significant right-sided pleuritic chest pain,  CT scan from April 11, 2018 showed new right pleural effusion, and disease progression -I reviewed her restaging PET scan in person with pt and her sister, it confirmed disease progression in the primary right hilar, mediastinal adenopathy, not hypermetabolic right pleural effusion, and right adrenal metastasis. -We discussed second line treatment options, I recommend Alimta 500 mg/m, every 3 weeks. --Chemotherapy consent: Side effects including but does not not limited to, fatigue, nausea, vomiting, diarrhea, hair loss, neuropathy, fluid retention, renal and kidney dysfunction, neutropenic fever, needed for blood transfusion, bleeding, pneumonitis, rash, were discussed with patient in great detail. She agrees to proceed. -The goal of therapy is palliative, to prolong her life, and improve her quality of life -She will start folic acid 1 mg daily today, and a B12 injection today -lab reviewed -First dose of Alimta in a week   2. Pleuritic right chest pain -located on the right lower mid axillary area, which is sharp and worsened with coughing and deep breathes. It started around 04/09/18.  -CT was negative for PE but showed loculated right pleural effusion, non-hypermetabolic on the PET scan, possible related to previous radiation, although metastasis not ruled out   3. Frontal Headaches -No evidence of brain metastasis. -She was previously evaluated by Dr. Mickeal Skinner, who thought the headaches were related to overuse of Tramadol and Fioricet. -f/u with Dr. Mickeal Skinner -overall stable   4. Anxiety and Depression -Currently on Xanax and Cymbalta. Continue.  5. Chronic back pain -Currently on Tramadol 50 mg q6Hr. Continue.  6. Low  appetite, Nausea -Currently on Mirtazapine, omeprazole, ondansetron, prochlorperazine, and sucralfate. Continue.  7. Hypokalemia  -K 2.9 today -will give IVF and KCL 66meq today -she will start KCL 46meq daily for 5 days then once daily   8.Goal of care discussion  -The patient  understands the goal of care is palliative. -she is full code now  Plan  -IVF with KCL 14meq today over 1 h -B12 injection today - I will order dexamethasone,  folic acid and KCl SA - F/u in 1 week to start second line chemo Alimta    No problem-specific Assessment & Plan notes found for this encounter.   No orders of the defined types were placed in this encounter.  All questions were answered. The patient knows to call the clinic with any problems, questions or concerns. No barriers to learning was detected. I spent 30 minutes counseling the patient face to face. The total time spent in the appointment was 40 minutes and more than 50% was on counseling and review of test results  I, Manson Allan am acting as scribe for Dr. Truitt Merle.  I have reviewed the above documentation for accuracy and completeness, and I agree with the above.     Truitt Merle, MD 04/25/2018

## 2018-04-25 ENCOUNTER — Inpatient Hospital Stay: Payer: 59 | Attending: Hematology

## 2018-04-25 ENCOUNTER — Inpatient Hospital Stay: Payer: 59

## 2018-04-25 ENCOUNTER — Telehealth: Payer: Self-pay | Admitting: Hematology

## 2018-04-25 ENCOUNTER — Inpatient Hospital Stay (HOSPITAL_BASED_OUTPATIENT_CLINIC_OR_DEPARTMENT_OTHER): Payer: 59 | Admitting: Hematology

## 2018-04-25 ENCOUNTER — Encounter: Payer: Self-pay | Admitting: Hematology

## 2018-04-25 VITALS — BP 113/77 | HR 88 | Temp 97.7°F | Resp 18 | Ht 67.0 in | Wt 180.2 lb

## 2018-04-25 DIAGNOSIS — Z95828 Presence of other vascular implants and grafts: Secondary | ICD-10-CM

## 2018-04-25 DIAGNOSIS — J9601 Acute respiratory failure with hypoxia: Secondary | ICD-10-CM | POA: Diagnosis not present

## 2018-04-25 DIAGNOSIS — C3431 Malignant neoplasm of lower lobe, right bronchus or lung: Secondary | ICD-10-CM

## 2018-04-25 DIAGNOSIS — R51 Headache: Secondary | ICD-10-CM

## 2018-04-25 DIAGNOSIS — M549 Dorsalgia, unspecified: Secondary | ICD-10-CM

## 2018-04-25 DIAGNOSIS — C3490 Malignant neoplasm of unspecified part of unspecified bronchus or lung: Secondary | ICD-10-CM

## 2018-04-25 DIAGNOSIS — E876 Hypokalemia: Secondary | ICD-10-CM | POA: Diagnosis not present

## 2018-04-25 DIAGNOSIS — Z9221 Personal history of antineoplastic chemotherapy: Secondary | ICD-10-CM

## 2018-04-25 DIAGNOSIS — F329 Major depressive disorder, single episode, unspecified: Secondary | ICD-10-CM

## 2018-04-25 DIAGNOSIS — C771 Secondary and unspecified malignant neoplasm of intrathoracic lymph nodes: Secondary | ICD-10-CM

## 2018-04-25 DIAGNOSIS — F419 Anxiety disorder, unspecified: Secondary | ICD-10-CM

## 2018-04-25 DIAGNOSIS — Z79899 Other long term (current) drug therapy: Secondary | ICD-10-CM

## 2018-04-25 DIAGNOSIS — C7971 Secondary malignant neoplasm of right adrenal gland: Secondary | ICD-10-CM | POA: Diagnosis not present

## 2018-04-25 DIAGNOSIS — K219 Gastro-esophageal reflux disease without esophagitis: Secondary | ICD-10-CM

## 2018-04-25 DIAGNOSIS — G8929 Other chronic pain: Secondary | ICD-10-CM

## 2018-04-25 DIAGNOSIS — Z9884 Bariatric surgery status: Secondary | ICD-10-CM

## 2018-04-25 DIAGNOSIS — Z7982 Long term (current) use of aspirin: Secondary | ICD-10-CM

## 2018-04-25 DIAGNOSIS — J90 Pleural effusion, not elsewhere classified: Secondary | ICD-10-CM

## 2018-04-25 DIAGNOSIS — Z923 Personal history of irradiation: Secondary | ICD-10-CM

## 2018-04-25 DIAGNOSIS — R110 Nausea: Secondary | ICD-10-CM

## 2018-04-25 LAB — CMP (CANCER CENTER ONLY)
ALT: 326 U/L (ref 0–44)
AST: 662 U/L — AB (ref 15–41)
Albumin: 2.2 g/dL — ABNORMAL LOW (ref 3.5–5.0)
Alkaline Phosphatase: 938 U/L — ABNORMAL HIGH (ref 38–126)
Anion gap: 8 (ref 5–15)
BUN: 4 mg/dL — ABNORMAL LOW (ref 6–20)
CALCIUM: 8.5 mg/dL — AB (ref 8.9–10.3)
CO2: 34 mmol/L — ABNORMAL HIGH (ref 22–32)
Chloride: 95 mmol/L — ABNORMAL LOW (ref 98–111)
Creatinine: 0.52 mg/dL (ref 0.44–1.00)
GFR, Est AFR Am: >60 mL/min (ref >60)
Glucose, Bld: 103 mg/dL — ABNORMAL HIGH (ref 70–99)
Potassium: 2.9 mmol/L — CL (ref 3.5–5.1)
Sodium: 137 mmol/L (ref 135–145)
Total Bilirubin: 0.6 mg/dL (ref 0.3–1.2)
Total Protein: 6.7 g/dL (ref 6.5–8.1)

## 2018-04-25 LAB — CBC WITH DIFFERENTIAL (CANCER CENTER ONLY)
Abs Immature Granulocytes: 0.02 10*3/uL (ref 0.00–0.07)
Basophils Absolute: 0.1 10*3/uL (ref 0.0–0.1)
Basophils Relative: 1 %
Eosinophils Absolute: 0.3 10*3/uL (ref 0.0–0.5)
Eosinophils Relative: 4 %
HCT: 30.7 % — ABNORMAL LOW (ref 36.0–46.0)
Hemoglobin: 9.7 g/dL — ABNORMAL LOW (ref 12.0–15.0)
Immature Granulocytes: 0 %
Lymphocytes Relative: 15 %
Lymphs Abs: 1 10*3/uL (ref 0.7–4.0)
MCH: 30.2 pg (ref 26.0–34.0)
MCHC: 31.6 g/dL (ref 30.0–36.0)
MCV: 95.6 fL (ref 80.0–100.0)
Monocytes Absolute: 0.7 10*3/uL (ref 0.1–1.0)
Monocytes Relative: 11 %
NEUTROS PCT: 69 %
Neutro Abs: 4.4 10*3/uL (ref 1.7–7.7)
PLATELETS: 523 10*3/uL — AB (ref 150–400)
RBC: 3.21 MIL/uL — ABNORMAL LOW (ref 3.87–5.11)
RDW: 13.2 % (ref 11.5–15.5)
WBC Count: 6.5 10*3/uL (ref 4.0–10.5)
nRBC: 0 % (ref 0.0–0.2)

## 2018-04-25 MED ORDER — SODIUM CHLORIDE 0.9 % IV SOLN: INTRAVENOUS | Status: DC

## 2018-04-25 MED ORDER — DEXAMETHASONE 4 MG PO TABS: ORAL_TABLET | ORAL | 1 refills | Status: DC

## 2018-04-25 MED ORDER — SODIUM CHLORIDE 0.9% FLUSH
10.0000 mL | INTRAVENOUS | Status: DC | PRN
  Administered 2018-04-25: 10 mL
  Filled 2018-04-25: qty 10

## 2018-04-25 MED ORDER — POTASSIUM CHLORIDE CRYS ER 20 MEQ PO TBCR: 20.0000 meq | EXTENDED_RELEASE_TABLET | Freq: Every day | ORAL | 0 refills | Status: DC

## 2018-04-25 MED ORDER — CYANOCOBALAMIN 1000 MCG/ML IJ SOLN
1000.0000 ug | Freq: Once | INTRAMUSCULAR | Status: AC
  Administered 2018-04-25: 1000 ug via INTRAMUSCULAR

## 2018-04-25 MED ORDER — FOLIC ACID 1 MG PO TABS: 1.0000 mg | ORAL_TABLET | Freq: Every day | ORAL | 3 refills | Status: DC

## 2018-04-25 MED ORDER — HEPARIN SOD (PORK) LOCK FLUSH 100 UNIT/ML IV SOLN
500.0000 [IU] | Freq: Once | INTRAVENOUS | Status: AC
  Administered 2018-04-25: 500 [IU] via INTRAVENOUS
  Filled 2018-04-25: qty 5

## 2018-04-25 MED ORDER — SODIUM CHLORIDE 0.9 % IV SOLN
Freq: Once | INTRAVENOUS | Status: AC
  Administered 2018-04-25: 14:00:00 via INTRAVENOUS
  Filled 2018-04-25: qty 500

## 2018-04-25 NOTE — Telephone Encounter (Signed)
Printed calendar and avs.  Scheduled appt per 02/04 los.

## 2018-04-25 NOTE — Patient Instructions (Signed)
Dehydration, Adult  Dehydration is when there is not enough fluid or water in your body. This happens when you lose more fluids than you take in. Dehydration can range from mild to very bad. It should be treated right away to keep it from getting very bad. Symptoms of mild dehydration may include:  Thirst.  Dry lips.  Slightly dry mouth.  Dry, warm skin.  Dizziness. Symptoms of moderate dehydration may include:  Very dry mouth.  Muscle cramps.  Dark pee (urine). Pee may be the color of tea.  Your body making less pee.  Your eyes making fewer tears.  Heartbeat that is uneven or faster than normal (palpitations).  Headache.  Light-headedness, especially when you stand up from sitting.  Fainting (syncope). Symptoms of very bad dehydration may include:  Changes in skin, such as: ? Cold and clammy skin. ? Blotchy (mottled) or pale skin. ? Skin that does not quickly return to normal after being lightly pinched and let go (poor skin turgor).  Changes in body fluids, such as: ? Feeling very thirsty. ? Your eyes making fewer tears. ? Not sweating when body temperature is high, such as in hot weather. ? Your body making very little pee.  Changes in vital signs, such as: ? Weak pulse. ? Pulse that is more than 100 beats a minute when you are sitting still. ? Fast breathing. ? Low blood pressure.  Other changes, such as: ? Sunken eyes. ? Cold hands and feet. ? Confusion. ? Lack of energy (lethargy). ? Trouble waking up from sleep. ? Short-term weight loss. ? Unconsciousness. Follow these instructions at home:   If told by your doctor, drink an ORS: ? Make an ORS by using instructions on the package. ? Start by drinking small amounts, about  cup (120 mL) every 5-10 minutes. ? Slowly drink more until you have had the amount that your doctor said to have.  Drink enough clear fluid to keep your pee clear or pale yellow. If you were told to drink an ORS, finish the  ORS first, then start slowly drinking clear fluids. Drink fluids such as: ? Water. Do not drink only water by itself. Doing that can make the salt (sodium) level in your body get too low (hyponatremia). ? Ice chips. ? Fruit juice that you have added water to (diluted). ? Low-calorie sports drinks.  Avoid: ? Alcohol. ? Drinks that have a lot of sugar. These include high-calorie sports drinks, fruit juice that does not have water added, and soda. ? Caffeine. ? Foods that are greasy or have a lot of fat or sugar.  Take over-the-counter and prescription medicines only as told by your doctor.  Do not take salt tablets. Doing that can make the salt level in your body get too high (hypernatremia).  Eat foods that have minerals (electrolytes). Examples include bananas, oranges, potatoes, tomatoes, and spinach.  Keep all follow-up visits as told by your doctor. This is important. Contact a doctor if:  You have belly (abdominal) pain that: ? Gets worse. ? Stays in one area (localizes).  You have a rash.  You have a stiff neck.  You get angry or annoyed more easily than normal (irritability).  You are more sleepy than normal.  You have a harder time waking up than normal.  You feel: ? Weak. ? Dizzy. ? Very thirsty.  You have peed (urinated) only a small amount of very dark pee during 6-8 hours. Get help right away if:  You have   symptoms of very bad dehydration.  You cannot drink fluids without throwing up (vomiting).  Your symptoms get worse with treatment.  You have a fever.  You have a very bad headache.  You are throwing up or having watery poop (diarrhea) and it: ? Gets worse. ? Does not go away.  You have blood or something green (bile) in your throw-up.  You have blood in your poop (stool). This may cause poop to look black and tarry.  You have not peed in 6-8 hours.  You pass out (faint).  Your heart rate when you are sitting still is more than 100 beats a  minute.  You have trouble breathing. This information is not intended to replace advice given to you by your health care provider. Make sure you discuss any questions you have with your health care provider. Document Released: 01/02/2009 Document Revised: 09/26/2015 Document Reviewed: 05/02/2015 Elsevier Interactive Patient Education  2019 [employer]  

## 2018-04-27 ENCOUNTER — Encounter: Payer: Self-pay | Admitting: Hematology

## 2018-04-28 ENCOUNTER — Telehealth: Payer: Self-pay | Admitting: Hematology

## 2018-04-28 ENCOUNTER — Emergency Department (HOSPITAL_COMMUNITY): Payer: 59

## 2018-04-28 ENCOUNTER — Other Ambulatory Visit: Payer: Self-pay

## 2018-04-28 ENCOUNTER — Encounter: Payer: Self-pay | Admitting: Nurse Practitioner

## 2018-04-28 ENCOUNTER — Inpatient Hospital Stay (HOSPITAL_COMMUNITY)
Admission: EM | Admit: 2018-04-28 | Discharge: 2018-05-01 | DRG: 189 | Disposition: A | Discharge status: Home / Self Care | Payer: 59 | Attending: Internal Medicine | Admitting: Internal Medicine

## 2018-04-28 ENCOUNTER — Other Ambulatory Visit: Payer: Self-pay | Admitting: Hematology

## 2018-04-28 ENCOUNTER — Encounter (HOSPITAL_COMMUNITY): Payer: Self-pay | Admitting: Emergency Medicine

## 2018-04-28 DIAGNOSIS — Z9221 Personal history of antineoplastic chemotherapy: Secondary | ICD-10-CM | POA: Diagnosis not present

## 2018-04-28 DIAGNOSIS — R0902 Hypoxemia: Secondary | ICD-10-CM

## 2018-04-28 DIAGNOSIS — C3490 Malignant neoplasm of unspecified part of unspecified bronchus or lung: Secondary | ICD-10-CM | POA: Diagnosis present

## 2018-04-28 DIAGNOSIS — J449 Chronic obstructive pulmonary disease, unspecified: Secondary | ICD-10-CM | POA: Diagnosis present

## 2018-04-28 DIAGNOSIS — E871 Hypo-osmolality and hyponatremia: Secondary | ICD-10-CM | POA: Diagnosis present

## 2018-04-28 DIAGNOSIS — J9811 Atelectasis: Secondary | ICD-10-CM | POA: Diagnosis present

## 2018-04-28 DIAGNOSIS — F329 Major depressive disorder, single episode, unspecified: Secondary | ICD-10-CM | POA: Diagnosis present

## 2018-04-28 DIAGNOSIS — J90 Pleural effusion, not elsewhere classified: Secondary | ICD-10-CM

## 2018-04-28 DIAGNOSIS — K589 Irritable bowel syndrome without diarrhea: Secondary | ICD-10-CM | POA: Diagnosis present

## 2018-04-28 DIAGNOSIS — Z9884 Bariatric surgery status: Secondary | ICD-10-CM | POA: Diagnosis not present

## 2018-04-28 DIAGNOSIS — F419 Anxiety disorder, unspecified: Secondary | ICD-10-CM | POA: Diagnosis present

## 2018-04-28 DIAGNOSIS — Z9889 Other specified postprocedural states: Secondary | ICD-10-CM

## 2018-04-28 DIAGNOSIS — Z79899 Other long term (current) drug therapy: Secondary | ICD-10-CM

## 2018-04-28 DIAGNOSIS — Z800 Family history of malignant neoplasm of digestive organs: Secondary | ICD-10-CM

## 2018-04-28 DIAGNOSIS — Z95828 Presence of other vascular implants and grafts: Secondary | ICD-10-CM

## 2018-04-28 DIAGNOSIS — Z66 Do not resuscitate: Secondary | ICD-10-CM | POA: Diagnosis present

## 2018-04-28 DIAGNOSIS — Z85118 Personal history of other malignant neoplasm of bronchus and lung: Secondary | ICD-10-CM | POA: Diagnosis not present

## 2018-04-28 DIAGNOSIS — Z885 Allergy status to narcotic agent status: Secondary | ICD-10-CM | POA: Diagnosis not present

## 2018-04-28 DIAGNOSIS — R0602 Shortness of breath: Secondary | ICD-10-CM

## 2018-04-28 DIAGNOSIS — Z8249 Family history of ischemic heart disease and other diseases of the circulatory system: Secondary | ICD-10-CM | POA: Diagnosis not present

## 2018-04-28 DIAGNOSIS — K219 Gastro-esophageal reflux disease without esophagitis: Secondary | ICD-10-CM | POA: Diagnosis present

## 2018-04-28 DIAGNOSIS — C7801 Secondary malignant neoplasm of right lung: Secondary | ICD-10-CM | POA: Diagnosis present

## 2018-04-28 DIAGNOSIS — Z87891 Personal history of nicotine dependence: Secondary | ICD-10-CM | POA: Diagnosis not present

## 2018-04-28 DIAGNOSIS — J9601 Acute respiratory failure with hypoxia: Principal | ICD-10-CM | POA: Diagnosis present

## 2018-04-28 DIAGNOSIS — Z825 Family history of asthma and other chronic lower respiratory diseases: Secondary | ICD-10-CM

## 2018-04-28 LAB — COMPREHENSIVE METABOLIC PANEL
ALT: 92 U/L — ABNORMAL HIGH (ref 0–44)
AST: 31 U/L (ref 15–41)
Albumin: 2.8 g/dL — ABNORMAL LOW (ref 3.5–5.0)
Alkaline Phosphatase: 670 U/L — ABNORMAL HIGH (ref 38–126)
Anion gap: 10 (ref 5–15)
BUN: 8 mg/dL (ref 6–20)
CO2: 28 mmol/L (ref 22–32)
Calcium: 8.5 mg/dL — ABNORMAL LOW (ref 8.9–10.3)
Chloride: 90 mmol/L — ABNORMAL LOW (ref 98–111)
Creatinine, Ser: 0.43 mg/dL — ABNORMAL LOW (ref 0.44–1.00)
GFR calc Af Amer: >60 mL/min (ref >60)
GFR calc non Af Amer: >60 mL/min (ref >60)
Glucose, Bld: 131 mg/dL — ABNORMAL HIGH (ref 70–99)
Potassium: 4.4 mmol/L (ref 3.5–5.1)
Sodium: 128 mmol/L — ABNORMAL LOW (ref 135–145)
Total Bilirubin: 0.8 mg/dL (ref 0.3–1.2)
Total Protein: 7.4 g/dL (ref 6.5–8.1)

## 2018-04-28 LAB — CBC WITH DIFFERENTIAL/PLATELET
Abs Immature Granulocytes: 0.05 10*3/uL (ref 0.00–0.07)
Basophils Absolute: 0.1 10*3/uL (ref 0.0–0.1)
Basophils Relative: 0 %
Eosinophils Absolute: 0 10*3/uL (ref 0.0–0.5)
Eosinophils Relative: 0 %
HCT: 35.4 % — ABNORMAL LOW (ref 36.0–46.0)
Hemoglobin: 10.8 g/dL — ABNORMAL LOW (ref 12.0–15.0)
IMMATURE GRANULOCYTES: 0 %
Lymphocytes Relative: 4 %
Lymphs Abs: 0.5 10*3/uL — ABNORMAL LOW (ref 0.7–4.0)
MCH: 29.1 pg (ref 26.0–34.0)
MCHC: 30.5 g/dL (ref 30.0–36.0)
MCV: 95.4 fL (ref 80.0–100.0)
Monocytes Absolute: 0.6 10*3/uL (ref 0.1–1.0)
Monocytes Relative: 5 %
NEUTROS PCT: 91 %
NRBC: 0 % (ref 0.0–0.2)
Neutro Abs: 11.8 10*3/uL — ABNORMAL HIGH (ref 1.7–7.7)
Platelets: 576 10*3/uL — ABNORMAL HIGH (ref 150–400)
RBC: 3.71 MIL/uL — ABNORMAL LOW (ref 3.87–5.11)
RDW: 13.3 % (ref 11.5–15.5)
WBC: 13 10*3/uL — ABNORMAL HIGH (ref 4.0–10.5)

## 2018-04-28 MED ORDER — OXYCODONE HCL 5 MG PO TABS
20.0000 mg | ORAL_TABLET | Freq: Three times a day (TID) | ORAL | Status: DC
  Administered 2018-04-29 – 2018-05-01 (×8): 20 mg via ORAL
  Filled 2018-04-28 (×8): qty 4

## 2018-04-28 MED ORDER — DICYCLOMINE HCL 20 MG PO TABS
20.0000 mg | ORAL_TABLET | Freq: Four times a day (QID) | ORAL | Status: DC
  Administered 2018-04-29 – 2018-05-01 (×11): 20 mg via ORAL
  Filled 2018-04-28 (×11): qty 1

## 2018-04-28 MED ORDER — SODIUM CHLORIDE 0.9 % IV SOLN
INTRAVENOUS | Status: DC
  Administered 2018-04-29 – 2018-04-30 (×5): via INTRAVENOUS

## 2018-04-28 MED ORDER — FUROSEMIDE 20 MG PO TABS: 20.0000 mg | ORAL_TABLET | Freq: Every day | ORAL | 0 refills | Status: AC | PRN

## 2018-04-28 MED ORDER — IPRATROPIUM BROMIDE 0.02 % IN SOLN
0.5000 mg | Freq: Four times a day (QID) | RESPIRATORY_TRACT | Status: DC
  Administered 2018-04-29: 0.5 mg via RESPIRATORY_TRACT
  Filled 2018-04-28: qty 2.5

## 2018-04-28 MED ORDER — DULOXETINE HCL 60 MG PO CPEP
60.0000 mg | ORAL_CAPSULE | Freq: Every day | ORAL | Status: DC
  Administered 2018-04-29 – 2018-05-01 (×3): 60 mg via ORAL
  Filled 2018-04-28 (×3): qty 1

## 2018-04-28 MED ORDER — GUAIFENESIN ER 600 MG PO TB12
600.0000 mg | ORAL_TABLET | Freq: Two times a day (BID) | ORAL | Status: DC
  Administered 2018-04-29 – 2018-05-01 (×6): 600 mg via ORAL
  Filled 2018-04-28 (×6): qty 1

## 2018-04-28 MED ORDER — BUTALBITAL-APAP-CAFFEINE 50-325-40 MG PO TABS
1.0000 | ORAL_TABLET | Freq: Four times a day (QID) | ORAL | Status: DC | PRN
  Administered 2018-04-29 – 2018-05-01 (×3): 2 via ORAL
  Filled 2018-04-28 (×3): qty 2

## 2018-04-28 MED ORDER — ALBUTEROL SULFATE (2.5 MG/3ML) 0.083% IN NEBU
2.5000 mg | INHALATION_SOLUTION | Freq: Four times a day (QID) | RESPIRATORY_TRACT | Status: DC
  Administered 2018-04-29: 2.5 mg via RESPIRATORY_TRACT
  Filled 2018-04-28: qty 3

## 2018-04-28 MED ORDER — GABAPENTIN 300 MG PO CAPS
300.0000 mg | ORAL_CAPSULE | Freq: Every day | ORAL | Status: DC | PRN
  Filled 2018-04-28: qty 1

## 2018-04-28 MED ORDER — MIRTAZAPINE 15 MG PO TABS: 7.5000 mg | ORAL_TABLET | Freq: Every evening | ORAL | Status: DC | PRN

## 2018-04-28 MED ORDER — ALPRAZOLAM 0.25 MG PO TABS: 0.2500 mg | ORAL_TABLET | Freq: Every evening | ORAL | Status: DC | PRN

## 2018-04-28 MED ORDER — ROPINIROLE HCL 1 MG PO TABS
0.5000 mg | ORAL_TABLET | Freq: Every day | ORAL | Status: DC
  Administered 2018-04-29 – 2018-04-30 (×3): 0.5 mg via ORAL
  Filled 2018-04-28 (×3): qty 1

## 2018-04-28 MED ORDER — ENOXAPARIN SODIUM 40 MG/0.4ML ~~LOC~~ SOLN
40.0000 mg | Freq: Every day | SUBCUTANEOUS | Status: DC
  Administered 2018-04-29 – 2018-04-30 (×3): 40 mg via SUBCUTANEOUS
  Filled 2018-04-28 (×3): qty 0.4

## 2018-04-28 MED ORDER — SUCRALFATE 1 G PO TABS
1.0000 g | ORAL_TABLET | Freq: Four times a day (QID) | ORAL | Status: DC
  Administered 2018-04-29 – 2018-05-01 (×10): 1 g via ORAL
  Filled 2018-04-28 (×10): qty 1

## 2018-04-28 MED ORDER — IOPAMIDOL (ISOVUE-370) INJECTION 76%
75.0000 mL | Freq: Once | INTRAVENOUS | Status: AC | PRN
  Administered 2018-04-28: 75 mL via INTRAVENOUS

## 2018-04-28 NOTE — ED Notes (Signed)
Patient ambulated in hall.  Patient's o2 sat 87% - 90% walking.

## 2018-04-28 NOTE — ED Provider Notes (Addendum)
[facility] EMERGENCY DEPARTMENT Provider Note   CSN: [id number] Arrival date & time: 04/28/18  1540     History   Chief Complaint Chief Complaint  Patient presents with  . Shortness of Breath    HPI Yvonne Lang is a 53 y.o. female.  Patient with known metastatic lung cancer followed by the [facility] long cancer center.  Patient with shortness of breath.  Symptoms started yesterday.  She states they were worse yesterday but still ongoing today.  Patient does not use home oxygen.  Here off of oxygen her sats will drop down into the 80s.  Patient recently had CT angios of her chest the end of January.  That had no signs of pulmonary embolus.  Patient denies any upper respiratory infection symptoms.  Heart rates been fairly consistently in the low 100s.     Past Medical History:  Diagnosis Date  . Anxiety   . Bronchitis   . COPD (chronic obstructive pulmonary disease) ([place])   . Depression   . GERD (gastroesophageal reflux disease)   . H/O gastric bypass    1999  . Irritable bowel syndrome   . Lung cancer [facility])     Patient Active Problem List   Diagnosis Date Noted  . Daily headache 02/13/2018  . Port-A-Cath in place 12/22/2017  . Metastatic non-small cell lung cancer ([place]) 10/06/2017  . Encounter for gynecological examination with Papanicolaou smear of cervix 03/17/2017  . Hot flashes due to menopause 03/17/2017  . IBS (irritable bowel syndrome) 10/25/2013  . Diarrhea 10/25/2013  . Abdominal pain, chronic, epigastric 10/25/2013    Past Surgical History:  Procedure Laterality Date  . BACK SURGERY    . CHOLECYSTECTOMY    . COLON SURGERY     for a kink in her colon and 12 inchs removed  . COLONOSCOPY N/A 12/13/2013   Procedure: COLONOSCOPY;  Surgeon: Rogene Houston, MD;  Location: [facility] ENDO SUITE;  Service: Endoscopy;  Laterality: N/A;  200  . ESOPHAGOGASTRODUODENOSCOPY N/A 12/13/2013   Procedure: ESOPHAGOGASTRODUODENOSCOPY (EGD);  Surgeon: Rogene Houston, MD;   Location: [facility] ENDO SUITE;  Service: Endoscopy;  Laterality: N/A;  . Gastric Bypas  2000  . IR IMAGING GUIDED PORT INSERTION  11/14/2017     OB History    Gravida  0   Para  0   Term  0   Preterm  0   AB  0   Living  0     SAB  0   TAB  0   Ectopic  0   Multiple  0   Live Births  0            Home Medications    Prior to Admission medications   Medication Sig Start Date End Date Taking? Authorizing Provider  albuterol (PROVENTIL HFA) 108 (90 Base) MCG/ACT inhaler Inhale 2 puffs into the lungs 2 (two) times daily. 02/10/18 02/10/19 Yes Truitt Merle, MD  albuterol (PROVENTIL) 4 MG tablet Take 4 mg by mouth 2 (two) times daily.  03/04/17  Yes [provider]  ALPRAZolam (XANAX) 0.25 MG tablet TAKE 1 TABLET BY MOUTH AT BEDTIME AS NEEDED FOR ANXIETY Patient taking differently: Take 0.25 mg by mouth at bedtime as needed for sleep.  04/20/18  Yes Tanner, Lyndon Code., PA-C  butalbital-acetaminophen-caffeine (FIORICET, ESGIC) 50-325-40 MG tablet Take 1-2 tablets by mouth every 6 (six) hours as needed for headache. 01/19/18 01/19/19 Yes Alla Feeling, NP  CALCIUM-MAGNESIUM-ZINC PO Take 1 tablet by mouth daily.  Yes [provider]  dexamethasone (DECADRON) 4 MG tablet Take 1 tab two times a day the day before Alimta chemo. Take 2 tabs the day after chemo, then take 2 tabs two times a day for 2 days. 04/25/18  Yes Truitt Merle, MD  dicyclomine (BENTYL) 20 MG tablet Take 1 tablet (20 mg total) by mouth every 6 (six) hours. 10/04/17  Yes Truitt Merle, MD  DULoxetine (CYMBALTA) 60 MG capsule Take 60 mg by mouth daily.   Yes [provider]  folic acid (FOLVITE) 1 MG tablet Take 1 tablet (1 mg total) by mouth daily. Start 5-7 days before Alimta chemotherapy. Continue until 21 days after Alimta completed. 04/25/18  Yes Truitt Merle, MD  ipratropium-albuterol (DUONEB) 0.5-2.5 (3) MG/3ML SOLN Take 3 mLs by nebulization every 4 (four) hours as needed. Patient taking differently:  Take 3 mLs by nebulization every 4 (four) hours as needed (for shortness of breath).  03/20/18  Yes Truitt Merle, MD  amoxicillin-clavulanate (AUGMENTIN) 875-125 MG tablet Take 1 tablet by mouth 2 (two) times daily. Patient not taking: Reported on 04/28/2018 03/20/18   Truitt Merle, MD  furosemide (LASIX) 20 MG tablet Take 1 tablet (20 mg total) by mouth daily as needed. 04/28/18   Truitt Merle, MD  gabapentin (NEURONTIN) 300 MG capsule TAKE 1 CAPSULE BY MOUTH 4 TIMES DAILY 10/03/17   [provider]  HYDROcodone-acetaminophen (NORCO) 5-325 MG tablet Take 1 tablet by mouth every 6 (six) hours as needed for moderate pain. 04/11/18   Truitt Merle, MD  HYDROcodone-homatropine [facility]) 5-1.5 MG/5ML syrup Take 5 mLs by mouth every 6 (six) hours as needed for cough. 04/11/18   Truitt Merle, MD  lidocaine-prilocaine (EMLA) cream Apply 1 application topically as needed. Apply to port one hour prior to port access/chemo 11/10/17   Truitt Merle, MD  magic mouthwash w/lidocaine SOLN Take 5 mLs by mouth 3 (three) times daily. Swish and spit 3 x daily 03/13/18   Truitt Merle, MD  magic mouthwash w/lidocaine SOLN Take 5 mLs by mouth 3 (three) times daily. 03/21/18   Truitt Merle, MD  mirtazapine (REMERON) 7.5 MG tablet Take 1 tablet (7.5 mg total) by mouth at bedtime. 11/18/17   Alla Feeling, NP  Multiple Vitamin (MULTIVITAMIN) tablet Take 1 tablet by mouth daily.    [provider]  omeprazole (PRILOSEC) 20 MG capsule Take 1 capsule (20 mg total) by mouth 2 (two) times daily before a meal. 10/10/17   Truitt Merle, MD  ondansetron (ZOFRAN) 8 MG tablet TAKE 1 TABLET BY MOUTH EVERY 8 HOURS AS NEEDED FOR NAUSEA AND VOMITING 04/20/18   Tanner, Lyndon Code., PA-C  potassium chloride SA (K-DUR,KLOR-CON) 20 MEQ tablet Take 1 tablet (20 mEq total) by mouth daily. 04/25/18   Truitt Merle, MD  prochlorperazine (COMPAZINE) 10 MG tablet Take 1 tablet (10 mg total) by mouth every 6 (six) hours as needed for nausea or vomiting. 12/06/17   Alla Feeling,  NP  prochlorperazine (COMPAZINE) 10 MG tablet TAKE 1 TABLET BY MOUTH EVERY 6 HOURS AS NEEDED FOR NAUSEA AND VOMITING 03/20/18   Truitt Merle, MD  RaNITidine HCl (ZANTAC PO) Take by mouth 3 (three) times daily.    [provider]  rOPINIRole (REQUIP) 0.5 MG tablet TAKE 1 TABLET BY MOUTH AT BEDTIME FOR 30 DAYS 11/04/17   [provider]  sucralfate (CARAFATE) 1 g tablet TAKE 1 TABLET(1 GRAM) BY MOUTH FOUR TIMES DAILY 02/13/18   Truitt Merle, MD  traMADol (ULTRAM) 50 MG  tablet Take 1 tablet (50 mg total) by mouth every 6 (six) hours as needed. 10/04/17   Truitt Merle, MD  TRELEGY ELLIPTA 100-62.5-25 MCG/INH AEPB INHALE 1 PUFF INTO THE LUNGS EVERY DAY 10/27/17   [provider]  UNABLE TO FIND Inhale into the lungs. Med Name: Trilogy as needed    [provider]    Family History Family History  Problem Relation Age of Onset  . Other Mother        has a pacemaker; rectal prolapse; rods in legs; lung fibrosis   . Other Father        heart gave out  . Hypertension Sister   . Bronchitis Sister   . Heart attack Paternal Grandmother   . COPD Maternal Grandmother   . Emphysema Maternal Grandmother   . Alcoholism Maternal Grandfather   . Cancer Sister        rare skin cancer  . Cancer Maternal Uncle 7       colon cancer   . Colon cancer Maternal Uncle     Social History Social History   Tobacco Use  . Smoking status: Former Smoker    Packs/day: 1.00    Years: 40.00    Pack years: 40.00    Types: Cigarettes  . Smokeless tobacco: Never Used  . Tobacco comment: Pack a day x 30 yrs. Trying to stop smoking using nicotine gum.   Substance Use Topics  . Alcohol use: No  . Drug use: No     Allergies   Codeine   Review of Systems Review of Systems  Constitutional: Positive for fatigue. Negative for chills and fever.  HENT: Negative for rhinorrhea and sore throat.   Eyes: Negative for visual disturbance.  Respiratory: Positive for shortness of breath.  Negative for cough.   Cardiovascular: Negative for chest pain and leg swelling.  Gastrointestinal: Negative for abdominal pain, diarrhea, nausea and vomiting.  Genitourinary: Negative for dysuria.  Musculoskeletal: Negative for back pain and neck pain.  Skin: Negative for rash.  Allergic/Immunologic: Positive for immunocompromised state.  Neurological: Negative for dizziness, light-headedness and headaches.  Hematological: Does not bruise/bleed easily.  Psychiatric/Behavioral: Negative for confusion.     Physical Exam Updated Vital Signs BP 104/73   Pulse 97   Temp 98.3 F (36.8 C) (Oral)   Resp 20   Ht 1.702 m (5\' 7" )   Wt 81.6 kg   SpO2 95%   BMI 28.19 kg/m   Physical Exam Vitals signs and nursing note reviewed.  Constitutional:      General: She is not in acute distress.    Appearance: She is well-developed. She is ill-appearing. She is not toxic-appearing.  HENT:     Head: Normocephalic and atraumatic.     Nose: No congestion.     Mouth/Throat:     Mouth: Mucous membranes are moist.  Eyes:     Extraocular Movements: Extraocular movements intact.     Conjunctiva/sclera: Conjunctivae normal.     Pupils: Pupils are equal, round, and reactive to light.  Neck:     Musculoskeletal: Normal range of motion and neck supple. No neck rigidity.  Cardiovascular:     Rate and Rhythm: Regular rhythm. Tachycardia present.     Heart sounds: Normal heart sounds. No murmur.  Pulmonary:     Effort: Pulmonary effort is normal. No respiratory distress.     Breath sounds: Normal breath sounds.     Comments: Port-A-Cath right anterior chest.  Decreased breath sounds on the right.  Abdominal:     General: Bowel sounds are normal.     Palpations: Abdomen is soft.     Tenderness: There is no abdominal tenderness.  Musculoskeletal: Normal range of motion.        General: No swelling.  Skin:    General: Skin is warm and dry.     Findings: No bruising.  Neurological:     General: No  focal deficit present.     Mental Status: She is alert and oriented to person, place, and time.      ED Treatments / Results  Labs (all labs ordered are listed, but only abnormal results are displayed) Labs Reviewed  CBC WITH DIFFERENTIAL/PLATELET - Abnormal; Notable for the following components:      Result Value   WBC 13.0 (*)    RBC 3.71 (*)    Hemoglobin 10.8 (*)    HCT 35.4 (*)    Platelets 576 (*)    Neutro Abs 11.8 (*)    Lymphs Abs 0.5 (*)    All other components within normal limits  COMPREHENSIVE METABOLIC PANEL - Abnormal; Notable for the following components:   Sodium 128 (*)    Chloride 90 (*)    Glucose, Bld 131 (*)    Creatinine, Ser 0.43 (*)    Calcium 8.5 (*)    Albumin 2.8 (*)    ALT 92 (*)    Alkaline Phosphatase 670 (*)    All other components within normal limits    EKG EKG Interpretation  Date/Time:  Friday April 28 2018 17:08:29 EST Ventricular Rate:  97 PR Interval:    QRS Duration: 80 QT Interval:  339 QTC Calculation: 431 R Axis:   80 Text Interpretation:  Sinus rhythm Probable left atrial enlargement Baseline wander in lead(s) I II No previous ECGs available Confirmed by Fredia Sorrow [phone]) on 04/28/2018 8:04:44 PM    ED ECG REPORT   Date: 04/28/2018  Rate: 97  Rhythm: normal sinus rhythm  QRS Axis: normal  Intervals: normal  ST/T Wave abnormalities: normal  Conduction Disutrbances:none  Narrative Interpretation:   Old EKG Reviewed: none available  Baseline wandering in leads II and III.  I have personally reviewed the EKG tracing and agree with the computerized printout as noted.   Radiology Dg Chest 2 View  Result Date: 04/28/2018 CLINICAL DATA:  Shortness of breath. EXAM: CHEST - 2 VIEW COMPARISON:  PET-CT dated April 21, 2018. FINDINGS: Unchanged right chest wall port catheter. Normal heart size. Unchanged right hilar fullness and architectural distortion. Small right pleural effusion appears decreased in size when  compared to prior PET. Unchanged right lower lobe atelectasis. No consolidation or pneumothorax. No acute osseous abnormality. IMPRESSION: 1.  No active cardiopulmonary disease. 2. Unchanged right hilar fullness and architectural distortion corresponding to known lung cancer post radiation changes. 3. Decreased now small right pleural effusion. Electronically Signed   By: Titus Dubin M.D.   On: 04/28/2018 17:12   Ct Angio Chest Pe W/cm &/or Wo Cm  Result Date: 04/28/2018 CLINICAL DATA:  53 y/o F; shortness of breath. History of stage IV lung cancer. EXAM: CT ANGIOGRAPHY CHEST WITH CONTRAST TECHNIQUE: Multidetector CT imaging of the chest was performed using the standard protocol during bolus administration of intravenous contrast. Multiplanar CT image reconstructions and MIPs were obtained to evaluate the vascular anatomy. CONTRAST:  35mL ISOVUE-370 IOPAMIDOL (ISOVUE-370) INJECTION 76% COMPARISON:  04/21/2018 PET-CT. 04/11/2018 CT chest. FINDINGS: Cardiovascular: Satisfactory opacification of the pulmonary arteries to the segmental level. No  evidence of pulmonary embolism. Normal heart size. No pericardial effusion. Mediastinum/Nodes: Stable right upper paratracheal lymphadenopathy measuring 12 mm short axis and right lower paratracheal lymphadenopathy measuring 11 mm short axis (series 4 image 27 and 31). Ill-defined right paramediastinal soft tissue is contiguous with the right hilum and stable in comparison with prior CT of chest. No axillary or supraclavicular adenopathy identified. Normal thyroid gland. Normal thoracic esophagus. Lungs/Pleura: Moderate loculated right-sided pleural effusion is successively increased in size. Diffuse ill-defined soft tissue in the right para mediastinum and hilar region is stable in distribution from prior CT of the chest. There is interval increased atelectasis of the right middle lobe with near complete collapse. Stable platelike atelectasis within the lower lobes  bilaterally. No consolidation or pneumothorax. Stable right retro hilar cavitary masslike soft tissue. Upper Abdomen: No acute abnormality. Musculoskeletal: No chest wall abnormality. No acute or significant osseous findings. Review of the MIP images confirms the above findings. IMPRESSION: 1. Interval increased atelectasis of the right middle lobe with near complete collapse. 2. No pulmonary embolus identified. 3. Increasing moderate loculated right-sided pleural effusion. 4. Stable right paramediastinal radiation fibrosis, ill-defined necrotic mass in the right retro hilar region, and mediastinal adenopathy from PET-CT. Electronically Signed   By: Kristine Garbe M.D.   On: 04/28/2018 19:24    Procedures Procedures (including critical care time)  Medications Ordered in ED Medications  0.9 %  sodium chloride infusion (has no administration in time range)  iopamidol (ISOVUE-370) 76 % injection 75 mL (75 mLs Intravenous Contrast Given 04/28/18 1906)     Initial Impression / Assessment and Plan / ED Course  I have reviewed the triage vital signs and the nursing notes.  Pertinent labs & imaging results that were available during my care of the patient were reviewed by me and considered in my medical decision making (see chart for details).    CT angios of the chest shows no evidence of pulmonary embolus.  But she does have a sort of complete collapse of the right middle lobe which is new.  Also has increasing pleural effusion.  This probably explains her hypoxia.  Patient does not have home oxygen on 2 L of oxygen she satting in the mid 90s.  On no oxygen she will sat low 90s or down as low as 86%.  Worse with any exertion.  Will discuss with hospitalist regarding admission.  Patient also with some mild hyponatremia.  Does have a little bit of an increased leukocytosis.  Patient normally followed by hematology oncology at [facility].   Final Clinical Impressions(s) / ED Diagnoses   Final  diagnoses:  SOB (shortness of breath)  Hypoxia  H/O: lung cancer    ED Discharge Orders    None       Fredia Sorrow, MD 04/28/18 Baltazar Najjar, MD 04/28/18 Mancel Bale    Fredia Sorrow, MD 04/28/18 2005

## 2018-04-28 NOTE — ED Triage Notes (Signed)
Patient reports SOB, has Stage 4 lung cancer. Onset of symptoms yesterday.

## 2018-04-28 NOTE — H&P (Addendum)
TRH H&P    Patient Demographics:    Yvonne Lang, is a 53 y.o. female  MRN: [id number]  DOB - February 28, 1966  Admit Date - 04/28/2018  Referring MD/NP/PA: Dr. Bobby Rumpf  Outpatient Primary MD for the patient is Redmond School, MD  Patient coming from: Home  Chief complaint-shortness of breath   HPI:    Yvonne Lang  is a 53 y.o. female, with history of metastatic non-small cell lung cancer, on palliative chemotherapy, came to hospital with worsening shortness of breath for past 2 days.  Patient is not on home oxygen.  In the ED patient was found to be hypoxic with O2 sats in 80s on room air.  CTA of the chest showed collapse of the right middle lobe.  Review of oncology notes showed that patient had cancer involving the right hilum and leading to collapse of the middle lobe in July 2019.  She is currently on palliative chemotherapy. She denies fever or chills. Denies coughing up phlegm intermittently. Denies chest pain Denies nausea, vomiting or diarrhea. Denies previous history of stroke or seizures.    Review of systems:    In addition to the HPI above,   All other systems reviewed and are negative.    Past History of the following :    Past Medical History:  Diagnosis Date  . Anxiety   . Bronchitis   . COPD (chronic obstructive pulmonary disease) ([place])   . Depression   . GERD (gastroesophageal reflux disease)   . H/O gastric bypass    1999  . Irritable bowel syndrome   . Lung cancer [facility])       Past Surgical History:  Procedure Laterality Date  . BACK SURGERY    . CHOLECYSTECTOMY    . COLON SURGERY     for a kink in her colon and 12 inchs removed  . COLONOSCOPY N/A 12/13/2013   Procedure: COLONOSCOPY;  Surgeon: Rogene Houston, MD;  Location: [facility];  Service: Endoscopy;  Laterality: N/A;  200  . ESOPHAGOGASTRODUODENOSCOPY N/A 12/13/2013   Procedure:  ESOPHAGOGASTRODUODENOSCOPY (EGD);  Surgeon: Rogene Houston, MD;  Location: [facility];  Service: Endoscopy;  Laterality: N/A;  . Gastric Bypas  2000  . IR IMAGING GUIDED PORT INSERTION  11/14/2017      Social History:      Social History   Tobacco Use  . Smoking status: Former Smoker    Packs/day: 1.00    Years: 40.00    Pack years: 40.00    Types: Cigarettes  . Smokeless tobacco: Never Used  . Tobacco comment: Pack a day x 30 yrs. Trying to stop smoking using nicotine gum.   Substance Use Topics  . Alcohol use: No       Family History :     Family History  Problem Relation Age of Onset  . Other Mother        has a pacemaker; rectal prolapse; rods in legs; lung fibrosis   . Other Father        heart gave out  .  Hypertension Sister   . Bronchitis Sister   . Heart attack Paternal Grandmother   . COPD Maternal Grandmother   . Emphysema Maternal Grandmother   . Alcoholism Maternal Grandfather   . Cancer Sister        rare skin cancer  . Cancer Maternal Uncle 7       colon cancer   . Colon cancer Maternal Uncle       Home Medications:   Prior to Admission medications   Medication Sig Start Date End Date Taking? Authorizing Provider  albuterol (PROVENTIL HFA) 108 (90 Base) MCG/ACT inhaler Inhale 2 puffs into the lungs 2 (two) times daily. 02/10/18 02/10/19 Yes Truitt Merle, MD  albuterol (PROVENTIL) 4 MG tablet Take 4 mg by mouth 2 (two) times daily.  03/04/17  Yes [provider]  ALPRAZolam (XANAX) 0.25 MG tablet TAKE 1 TABLET BY MOUTH AT BEDTIME AS NEEDED FOR ANXIETY Patient taking differently: Take 0.25 mg by mouth at bedtime as needed for sleep.  04/20/18  Yes Tanner, Lyndon Code., PA-C  butalbital-acetaminophen-caffeine (FIORICET, ESGIC) 50-325-40 MG tablet Take 1-2 tablets by mouth every 6 (six) hours as needed for headache. 01/19/18 01/19/19 Yes Alla Feeling, NP  CALCIUM-MAGNESIUM-ZINC PO Take 1 tablet by mouth daily.   Yes [provider]    dexamethasone (DECADRON) 4 MG tablet Take 1 tab two times a day the day before Alimta chemo. Take 2 tabs the day after chemo, then take 2 tabs two times a day for 2 days. 04/25/18  Yes Truitt Merle, MD  dicyclomine (BENTYL) 20 MG tablet Take 1 tablet (20 mg total) by mouth every 6 (six) hours. 10/04/17  Yes Truitt Merle, MD  DULoxetine (CYMBALTA) 60 MG capsule Take 60 mg by mouth daily.   Yes [provider]  folic acid (FOLVITE) 1 MG tablet Take 1 tablet (1 mg total) by mouth daily. Start 5-7 days before Alimta chemotherapy. Continue until 21 days after Alimta completed. 04/25/18  Yes Truitt Merle, MD  furosemide (LASIX) 20 MG tablet Take 1 tablet (20 mg total) by mouth daily as needed. Patient taking differently: Take 20 mg by mouth daily as needed for fluid.  04/28/18  Yes Truitt Merle, MD  gabapentin (NEURONTIN) 300 MG capsule Take 300 mg by mouth daily as needed (for pain).  10/03/17  Yes [provider]  ipratropium-albuterol (DUONEB) 0.5-2.5 (3) MG/3ML SOLN Take 3 mLs by nebulization every 4 (four) hours as needed. Patient taking differently: Take 3 mLs by nebulization every 4 (four) hours as needed (for shortness of breath).  03/20/18  Yes Truitt Merle, MD  lidocaine-prilocaine (EMLA) cream Apply 1 application topically as needed. Apply to port one hour prior to port access/chemo 11/10/17  Yes Truitt Merle, MD  magic mouthwash w/lidocaine SOLN Take 5 mLs by mouth 3 (three) times daily. Swish and spit 3 x daily 03/13/18  Yes Truitt Merle, MD  mirtazapine (REMERON) 7.5 MG tablet Take 1 tablet (7.5 mg total) by mouth at bedtime. Patient taking differently: Take 7.5 mg by mouth at bedtime as needed (for rest).  11/18/17  Yes Alla Feeling, NP  Multiple Vitamin (MULTIVITAMIN) tablet Take 1 tablet by mouth daily.   Yes [provider]  omeprazole (PRILOSEC) 20 MG capsule Take 1 capsule (20 mg total) by mouth 2 (two) times daily before a meal. 10/10/17  Yes Truitt Merle, MD  ondansetron (ZOFRAN) 8 MG  tablet TAKE 1 TABLET BY MOUTH EVERY 8 HOURS AS NEEDED FOR NAUSEA AND VOMITING 04/20/18  Yes Harle Stanford., PA-C  Oxycodone HCl 10 MG TABS Take 20 mg by mouth 3 (three) times daily. 04/20/18  Yes [provider]  potassium chloride SA (K-DUR,KLOR-CON) 20 MEQ tablet Take 1 tablet (20 mEq total) by mouth daily. 04/25/18  Yes Truitt Merle, MD  prochlorperazine (COMPAZINE) 10 MG tablet Take 1 tablet (10 mg total) by mouth every 6 (six) hours as needed for nausea or vomiting. 12/06/17  Yes Alla Feeling, NP  sucralfate (CARAFATE) 1 g tablet TAKE 1 TABLET(1 GRAM) BY MOUTH FOUR TIMES DAILY Patient taking differently: Take 1 g by mouth 4 (four) times daily.  02/13/18  Yes Truitt Merle, MD  traMADol (ULTRAM) 50 MG tablet Take 1 tablet (50 mg total) by mouth every 6 (six) hours as needed. 10/04/17  Yes Truitt Merle, MD  TRELEGY ELLIPTA 100-62.5-25 MCG/INH AEPB Inhale 1 puff into the lungs daily.  10/27/17  Yes [provider]  amoxicillin-clavulanate (AUGMENTIN) 875-125 MG tablet Take 1 tablet by mouth 2 (two) times daily. Patient not taking: Reported on 04/28/2018 03/20/18   Truitt Merle, MD  HYDROcodone-acetaminophen [facility]) 5-325 MG tablet Take 1 tablet by mouth every 6 (six) hours as needed for moderate pain. Patient not taking: Reported on 04/28/2018 04/11/18   Truitt Merle, MD  HYDROcodone-homatropine [facility]) 5-1.5 MG/5ML syrup Take 5 mLs by mouth every 6 (six) hours as needed for cough. Patient not taking: Reported on 04/28/2018 04/11/18   Truitt Merle, MD  magic mouthwash w/lidocaine SOLN Take 5 mLs by mouth 3 (three) times daily. Patient not taking: Reported on 04/28/2018 03/21/18   Truitt Merle, MD  prochlorperazine (COMPAZINE) 10 MG tablet TAKE 1 TABLET BY MOUTH EVERY 6 HOURS AS NEEDED FOR NAUSEA AND VOMITING Patient not taking: Reported on 04/28/2018 03/20/18   Truitt Merle, MD  rOPINIRole (REQUIP) 0.5 MG tablet Take 0.5 mg by mouth at bedtime.  11/04/17   [provider]     Allergies:     Allergies    Allergen Reactions  . Codeine Other (See Comments)    Migraine headache     Physical Exam:   Vitals  Blood pressure 104/73, pulse 97, temperature 98.3 F (36.8 C), temperature source Oral, resp. rate 20, height 5\' 7"  (1.702 m), weight 81.6 kg, SpO2 95 %.  1.  General: Appears in no acute distress  2. Psychiatric: Alert, oriented x3, intact insight and judgment.  3. Neurologic: Cranial nerves II to XII grossly intact, moving all extremities  4. HEENMT:  Atraumatic normocephalic, extraocular muscles are intact.  Oral mucosa is pink and moist.  5. Respiratory : Clear to auscultation bilaterally  6. Cardiovascular : S1-S2, regular, no murmur auscultated  7. Gastrointestinal:  Abdomen is soft, nontender, no organomegaly  8. Skin:  No rash noted     Data Review:    CBC Recent Labs  Lab 04/25/18 1144 04/28/18 1720  WBC 6.5 13.0*  HGB 9.7* 10.8*  HCT 30.7* 35.4*  PLT 523* 576*  MCV 95.6 95.4  MCH 30.2 29.1  MCHC 31.6 30.5  RDW 13.2 13.3  LYMPHSABS 1.0 0.5*  MONOABS 0.7 0.6  EOSABS 0.3 0.0  BASOSABS 0.1 0.1   ------------------------------------------------------------------------------------------------------------------  Results for orders placed or performed during the hospital encounter of 04/28/18 (from the past 48 hour(s))  CBC with Differential/Platelet     Status: Abnormal   Collection Time: 04/28/18  5:20 PM  Result Value Ref Range   WBC 13.0 (H) 4.0 - 10.5 K/uL   RBC 3.71 (L) 3.87 -  5.11 MIL/uL   Hemoglobin 10.8 (L) 12.0 - 15.0 g/dL   HCT 35.4 (L) 36.0 - 46.0 %   MCV 95.4 80.0 - 100.0 fL   MCH 29.1 26.0 - 34.0 pg   MCHC 30.5 30.0 - 36.0 g/dL   RDW 13.3 11.5 - 15.5 %   Platelets 576 (H) 150 - 400 K/uL   nRBC 0.0 0.0 - 0.2 %   Neutrophils Relative % 91 %   Neutro Abs 11.8 (H) 1.7 - 7.7 K/uL   Lymphocytes Relative 4 %   Lymphs Abs 0.5 (L) 0.7 - 4.0 K/uL   Monocytes Relative 5 %   Monocytes Absolute 0.6 0.1 - 1.0 K/uL   Eosinophils  Relative 0 %   Eosinophils Absolute 0.0 0.0 - 0.5 K/uL   Basophils Relative 0 %   Basophils Absolute 0.1 0.0 - 0.1 K/uL   Immature Granulocytes 0 %   Abs Immature Granulocytes 0.05 0.00 - 0.07 K/uL    Comment: Performed at [facility], [address] [place], [place] [address]  Comprehensive metabolic panel     Status: Abnormal   Collection Time: 04/28/18  5:20 PM  Result Value Ref Range   Sodium 128 (L) 135 - 145 mmol/L   Potassium 4.4 3.5 - 5.1 mmol/L   Chloride 90 (L) 98 - 111 mmol/L   CO2 28 22 - 32 mmol/L   Glucose, Bld 131 (H) 70 - 99 mg/dL   BUN 8 6 - 20 mg/dL   Creatinine, Ser 0.43 (L) 0.44 - 1.00 mg/dL   Calcium 8.5 (L) 8.9 - 10.3 mg/dL   Total Protein 7.4 6.5 - 8.1 g/dL   Albumin 2.8 (L) 3.5 - 5.0 g/dL   AST 31 15 - 41 U/L   ALT 92 (H) 0 - 44 U/L   Alkaline Phosphatase 670 (H) 38 - 126 U/L   Total Bilirubin 0.8 0.3 - 1.2 mg/dL   GFR calc non Af Amer >60 >60 mL/min   GFR calc Af Amer >60 >60 mL/min   Anion gap 10 5 - 15    Comment: Performed at [facility], [address] [place], [place] [address]    Chemistries  Recent Labs  Lab 04/25/18 1144 04/28/18 1720  NA 137 128*  K 2.9* 4.4  CL 95* 90*  CO2 34* 28  GLUCOSE 103* 131*  BUN 4* 8  CREATININE 0.52 0.43*  CALCIUM 8.5* 8.5*  AST 662* 31  ALT 326* 92*  ALKPHOS 938* 670*  BILITOT 0.6 0.8   ------------------------------------------------------------------------------------------------------------------  ------------------------------------------------------------------------------------------------------------------ GFR: Estimated Creatinine Clearance: 89.4 mL/min (A) (by C-G formula based on SCr of 0.43 mg/dL (L)). Liver Function Tests: Recent Labs  Lab 04/25/18 1144 04/28/18 1720  AST 662* 31  ALT 326* 92*  ALKPHOS 938* 670*  BILITOT 0.6 0.8  PROT 6.7 7.4  ALBUMIN 2.2* 2.8*     --------------------------------------------------------------------------------------------------------------- Urine analysis:    Component Value Date/Time   PROTEINUR <30 (A) 04/11/2018 1346      Imaging Results:    Dg Chest 2 View  Result Date: 04/28/2018 CLINICAL DATA:  Shortness of breath. EXAM: CHEST - 2 VIEW COMPARISON:  PET-CT dated April 21, 2018. FINDINGS: Unchanged right chest wall port catheter. Normal heart size. Unchanged right hilar fullness and architectural distortion. Small right pleural effusion appears decreased in size when compared to prior PET. Unchanged right lower lobe atelectasis. No consolidation or pneumothorax. No acute osseous abnormality. IMPRESSION: 1.  No active cardiopulmonary disease. 2. Unchanged right hilar fullness and architectural  distortion corresponding to known lung cancer post radiation changes. 3. Decreased now small right pleural effusion. Electronically Signed   By: Titus Dubin M.D.   On: 04/28/2018 17:12   Ct Angio Chest Pe W/cm &/or Wo Cm  Result Date: 04/28/2018 CLINICAL DATA:  53 y/o F; shortness of breath. History of stage IV lung cancer. EXAM: CT ANGIOGRAPHY CHEST WITH CONTRAST TECHNIQUE: Multidetector CT imaging of the chest was performed using the standard protocol during bolus administration of intravenous contrast. Multiplanar CT image reconstructions and MIPs were obtained to evaluate the vascular anatomy. CONTRAST:  5mL ISOVUE-370 IOPAMIDOL (ISOVUE-370) INJECTION 76% COMPARISON:  04/21/2018 PET-CT. 04/11/2018 CT chest. FINDINGS: Cardiovascular: Satisfactory opacification of the pulmonary arteries to the segmental level. No evidence of pulmonary embolism. Normal heart size. No pericardial effusion. Mediastinum/Nodes: Stable right upper paratracheal lymphadenopathy measuring 12 mm short axis and right lower paratracheal lymphadenopathy measuring 11 mm short axis (series 4 image 27 and 31). Ill-defined right paramediastinal soft  tissue is contiguous with the right hilum and stable in comparison with prior CT of chest. No axillary or supraclavicular adenopathy identified. Normal thyroid gland. Normal thoracic esophagus. Lungs/Pleura: Moderate loculated right-sided pleural effusion is successively increased in size. Diffuse ill-defined soft tissue in the right para mediastinum and hilar region is stable in distribution from prior CT of the chest. There is interval increased atelectasis of the right middle lobe with near complete collapse. Stable platelike atelectasis within the lower lobes bilaterally. No consolidation or pneumothorax. Stable right retro hilar cavitary masslike soft tissue. Upper Abdomen: No acute abnormality. Musculoskeletal: No chest wall abnormality. No acute or significant osseous findings. Review of the MIP images confirms the above findings. IMPRESSION: 1. Interval increased atelectasis of the right middle lobe with near complete collapse. 2. No pulmonary embolus identified. 3. Increasing moderate loculated right-sided pleural effusion. 4. Stable right paramediastinal radiation fibrosis, ill-defined necrotic mass in the right retro hilar region, and mediastinal adenopathy from PET-CT. Electronically Signed   By: Kristine Garbe M.D.   On: 04/28/2018 19:24       Assessment & Plan:    Active Problems:   Acute respiratory failure with hypoxia (HCC)   1. 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  Patient CT scan of the chest again shows right middle lobe collapse.  Likely from metastasis to right hilum.  Also shows slight worsening of right pleural effusion.  Patient now has new oxygen requirement.  Will transfer to [facility] long, where she gets care for her cancer at cancer center.  Also in case patient needs any intervention it would be best to be transferred to [facility] long for pulmonary  evaluation.  2. Headache-continue Fioricet as needed  3. Anxiety and depression-continue Xanax, Cymbalta  4. Chronic pain syndrome-continue oxycodone.  5. Hyponatremia-sodium is 128, mild.  Will check serum osmolality.  Follow BMP in a.m.  Patient started on IV normal saline.  Likely from underlying malignancy, diuretics.     DVT Prophylaxis-   Lovenox   AM Labs Ordered, also please review Full Orders  Family Communication: Admission, patients condition and plan of care including tests being ordered have been discussed with the patient and her family member at bedside* who indicate understanding and agree with the plan and Code Status.  Code Status: DNR  Admission status: Inpatient: Based on patients clinical presentation and evaluation of above clinical data, I have made determination that patient meets Inpatient criteria at this time.  Patient admitted with acute respiratory failure with hypoxia.  New oxygen requirement.  Time spent in minutes : 60 minutes   Oswald Hillock M.D on 04/28/2018 at 9:10 PM

## 2018-04-28 NOTE — Telephone Encounter (Signed)
I called patient back, she has slightly worsening dyspnea, no flames, no fever or chills.  We discussed her dyspnea is probably related to her lung cancer, inflammatory reaction to radiation, and right pleural effusion.  I recommend her to start dexamethasone 4 mg daily, and I will call in furosemide 20 mg daily, to see if her symptoms improve.  We will check on her next Monday.  Patient try to avoid ED visit.  She agrees with the plan.  Truitt Merle  04/28/2018

## 2018-04-29 ENCOUNTER — Encounter: Payer: Self-pay | Admitting: Hematology

## 2018-04-29 DIAGNOSIS — J90 Pleural effusion, not elsewhere classified: Secondary | ICD-10-CM

## 2018-04-29 LAB — OSMOLALITY: Osmolality: 281 mOsm/kg (ref 275–295)

## 2018-04-29 LAB — COMPREHENSIVE METABOLIC PANEL
ALT: 69 U/L — ABNORMAL HIGH (ref 0–44)
AST: 28 U/L (ref 15–41)
Albumin: 2.3 g/dL — ABNORMAL LOW (ref 3.5–5.0)
Alkaline Phosphatase: 565 U/L — ABNORMAL HIGH (ref 38–126)
Anion gap: 8 (ref 5–15)
BUN: 7 mg/dL (ref 6–20)
CO2: 31 mmol/L (ref 22–32)
Calcium: 8.7 mg/dL — ABNORMAL LOW (ref 8.9–10.3)
Chloride: 97 mmol/L — ABNORMAL LOW (ref 98–111)
Creatinine, Ser: 0.39 mg/dL — ABNORMAL LOW (ref 0.44–1.00)
GFR calc Af Amer: >60 mL/min (ref >60)
GFR calc non Af Amer: >60 mL/min (ref >60)
Glucose, Bld: 123 mg/dL — ABNORMAL HIGH (ref 70–99)
Potassium: 4 mmol/L (ref 3.5–5.1)
Sodium: 136 mmol/L (ref 135–145)
Total Bilirubin: 0.8 mg/dL (ref 0.3–1.2)
Total Protein: 6.7 g/dL (ref 6.5–8.1)

## 2018-04-29 LAB — GLUCOSE, CAPILLARY
GLUCOSE-CAPILLARY: 151 mg/dL — AB (ref 70–99)
Glucose-Capillary: 92 mg/dL (ref 70–99)
Glucose-Capillary: 122 mg/dL — ABNORMAL HIGH (ref 70–99)

## 2018-04-29 LAB — CBC
HCT: 31.2 % — ABNORMAL LOW (ref 36.0–46.0)
Hemoglobin: 9.4 g/dL — ABNORMAL LOW (ref 12.0–15.0)
MCH: 29.2 pg (ref 26.0–34.0)
MCHC: 30.1 g/dL (ref 30.0–36.0)
MCV: 96.9 fL (ref 80.0–100.0)
Platelets: 552 10*3/uL — ABNORMAL HIGH (ref 150–400)
RBC: 3.22 MIL/uL — ABNORMAL LOW (ref 3.87–5.11)
RDW: 13.1 % (ref 11.5–15.5)
WBC: 10.7 10*3/uL — ABNORMAL HIGH (ref 4.0–10.5)
nRBC: 0 % (ref 0.0–0.2)

## 2018-04-29 LAB — HIV ANTIBODY (ROUTINE TESTING W REFLEX): HIV Screen 4th Generation wRfx: NONREACTIVE

## 2018-04-29 MED ORDER — INSULIN ASPART 100 UNIT/ML ~~LOC~~ SOLN
0.0000 [IU] | Freq: Three times a day (TID) | SUBCUTANEOUS | Status: DC
  Administered 2018-04-29: 1 [IU] via SUBCUTANEOUS

## 2018-04-29 MED ORDER — IPRATROPIUM-ALBUTEROL 0.5-2.5 (3) MG/3ML IN SOLN
3.0000 mL | Freq: Three times a day (TID) | RESPIRATORY_TRACT | Status: DC
  Administered 2018-04-29 – 2018-05-01 (×5): 3 mL via RESPIRATORY_TRACT
  Filled 2018-04-29 (×7): qty 3

## 2018-04-29 MED ORDER — INSULIN ASPART 100 UNIT/ML ~~LOC~~ SOLN: 0.0000 [IU] | Freq: Every day | SUBCUTANEOUS | Status: DC

## 2018-04-29 MED ORDER — ALBUTEROL SULFATE (2.5 MG/3ML) 0.083% IN NEBU: 2.5000 mg | INHALATION_SOLUTION | RESPIRATORY_TRACT | Status: DC | PRN

## 2018-04-29 MED ORDER — FUROSEMIDE 40 MG PO TABS: 40.0000 mg | ORAL_TABLET | Freq: Every day | ORAL | Status: DC

## 2018-04-29 MED ORDER — SODIUM CHLORIDE 0.9% FLUSH: 10.0000 mL | INTRAVENOUS | Status: DC | PRN

## 2018-04-29 MED ORDER — DEXAMETHASONE SODIUM PHOSPHATE 4 MG/ML IJ SOLN
4.0000 mg | Freq: Two times a day (BID) | INTRAMUSCULAR | Status: DC
  Administered 2018-04-29 – 2018-05-01 (×5): 4 mg via INTRAVENOUS
  Filled 2018-04-29 (×5): qty 1

## 2018-04-29 NOTE — Progress Notes (Signed)
PROGRESS NOTE    Yvonne Lang  YTK:[id number] DOB: 01/05/1966 DOA: 04/28/2018 PCP: Redmond School, MD   Brief Narrative:  54 year old with history of metastatic non-small cell lung cancer on palliative chemotherapy came to the hospital with worsening of shortness of breath.  Patient has known malignancy in the right hilum leading to collapse of right middle lobe in the past.  This time similar symptoms and findings confirmed on CTA.  Yesterday patient was advised to take daily dexamethasone 4 mg and Lasix 20 mg.  But due to worsening of symptoms patient comes back to the hospital.   Assessment & Plan:   Principal Problem:   Acute respiratory failure with hypoxia (HCC) Active Problems:   Metastatic non-small cell lung cancer (HCC)   Port-A-Cath in place   Pleural effusion  Acute respiratory distress with hypoxia Loculated right-sided pleural effusion, moderate Right middle lobe atelectasis, postobstructive Metastatic non-small cell lung cancer -This morning she is doing slightly better.  On 2 L nasal cannula saturating greater than 90%.  Will order incentive spirometry. -Dexamethasone twice daily, insulin sliding scale -We will add oncology, Dr. Annamaria Boots to the treatment team. - Provide supportive care. -Bronchodilators as needed. - Unable to give Lasix as she clinically.  Slightly dehydrated. -Advised nursing staff to ambulate the patient as much as possible and see how she does throughout the day.  Hyponatremia -Improved with fluids.  Anxiety/depression -Continue Xanax and Cymbalta   DVT prophylaxis: Lovenox Code Status: DNR Family Communication: None at bedside Disposition Plan: Maintain inpatient stay for supplemental oxygen.  Consultants:   Oncology consultation placed  Procedures:   None  Antimicrobials:   None   Subjective: While at rest patient symptomatically feels better.  Does still get slightly exertional short of breath.  Does not feel back to her  baseline  Review of Systems Otherwise negative except as per HPI, including: General: Denies fever, chills, night sweats or unintended weight loss. Resp: Denies cough, wheezing Cardiac: Denies chest pain, palpitations, orthopnea, paroxysmal nocturnal dyspnea. GI: Denies abdominal pain, nausea, vomiting, diarrhea or constipation GU: Denies dysuria, frequency, hesitancy or incontinence MS: Denies muscle aches, joint pain or swelling Neuro: Denies headache, neurologic deficits (focal weakness, numbness, tingling), abnormal gait Psych: Denies anxiety, depression, SI/HI/AVH Skin: Denies new rashes or lesions ID: Denies sick contacts, exotic exposures, travel  Objective: Vitals:   04/28/18 2255 04/29/18 0107 04/29/18 0500 04/29/18 0848  BP: 110/84  110/78   Pulse: 93  78   Resp: 20  20   Temp: 97.7 F (36.5 C)  98.6 F (37 C)   TempSrc: Oral  Oral   SpO2: 96% 97% 95% 96%  Weight:      Height:        Intake/Output Summary (Last 24 hours) at 04/29/2018 1247 Last data filed at 04/29/2018 1053 Gross per 24 hour  Intake 814.68 ml  Output -  Net 814.68 ml   Filed Weights   04/28/18 1548  Weight: 81.6 kg    Examination:  General exam: Appears calm and comfortable, on 2 L nasal cannula Respiratory system: Diminished breath sounds especially on the right side. Cardiovascular system: S1 & S2 heard, RRR. No JVD, murmurs, rubs, gallops or clicks. No pedal edema. Gastrointestinal system: Abdomen is nondistended, soft and nontender. No organomegaly or masses felt. Normal bowel sounds heard. Central nervous system: Alert and oriented. No focal neurological deficits. Extremities: Symmetric 4 x 5 power. Skin: No rashes, lesions or ulcers Psychiatry: Judgement and insight appear normal. Mood & affect appropriate.  Chest wall port noted   Data Reviewed:   CBC: Recent Labs  Lab 04/25/18 1144 04/28/18 1720 04/29/18 0501  WBC 6.5 13.0* 10.7*  NEUTROABS 4.4 11.8*  --   HGB 9.7* 10.8*  9.4*  HCT 30.7* 35.4* 31.2*  MCV 95.6 95.4 96.9  PLT 523* 576* 010*   Basic Metabolic Panel: Recent Labs  Lab 04/25/18 1144 04/28/18 1720 04/29/18 0501  NA 137 128* 136  K 2.9* 4.4 4.0  CL 95* 90* 97*  CO2 34* 28 31  GLUCOSE 103* 131* 123*  BUN 4* 8 7  CREATININE 0.52 0.43* 0.39*  CALCIUM 8.5* 8.5* 8.7*   GFR: Estimated Creatinine Clearance: 89.4 mL/min (A) (by C-G formula based on SCr of 0.39 mg/dL (L)). Liver Function Tests: Recent Labs  Lab 04/25/18 1144 04/28/18 1720 04/29/18 0501  AST 662* 31 28  ALT 326* 92* 69*  ALKPHOS 938* 670* 565*  BILITOT 0.6 0.8 0.8  PROT 6.7 7.4 6.7  ALBUMIN 2.2* 2.8* 2.3*   No results for input(s): LIPASE, AMYLASE in the last 168 hours. No results for input(s): AMMONIA in the last 168 hours. Coagulation Profile: No results for input(s): INR, PROTIME in the last 168 hours. Cardiac Enzymes: No results for input(s): CKTOTAL, CKMB, CKMBINDEX, TROPONINI in the last 168 hours. BNP (last 3 results) No results for input(s): PROBNP in the last 8760 hours. HbA1C: No results for input(s): HGBA1C in the last 72 hours. CBG: No results for input(s): GLUCAP in the last 168 hours. Lipid Profile: No results for input(s): CHOL, HDL, LDLCALC, TRIG, CHOLHDL, LDLDIRECT in the last 72 hours. Thyroid Function Tests: No results for input(s): TSH, T4TOTAL, FREET4, T3FREE, THYROIDAB in the last 72 hours. Anemia Panel: No results for input(s): VITAMINB12, FOLATE, FERRITIN, TIBC, IRON, RETICCTPCT in the last 72 hours. Sepsis Labs: No results for input(s): PROCALCITON, LATICACIDVEN in the last 168 hours.  No results found for this or any previous visit (from the past 240 hour(s)).       Radiology Studies: Dg Chest 2 View  Result Date: 04/28/2018 CLINICAL DATA:  Shortness of breath. EXAM: CHEST - 2 VIEW COMPARISON:  PET-CT dated April 21, 2018. FINDINGS: Unchanged right chest wall port catheter. Normal heart size. Unchanged right hilar fullness and  architectural distortion. Small right pleural effusion appears decreased in size when compared to prior PET. Unchanged right lower lobe atelectasis. No consolidation or pneumothorax. No acute osseous abnormality. IMPRESSION: 1.  No active cardiopulmonary disease. 2. Unchanged right hilar fullness and architectural distortion corresponding to known lung cancer post radiation changes. 3. Decreased now small right pleural effusion. Electronically Signed   By: Titus Dubin M.D.   On: 04/28/2018 17:12   Ct Angio Chest Pe W/cm &/or Wo Cm  Result Date: 04/28/2018 CLINICAL DATA:  53 y/o F; shortness of breath. History of stage IV lung cancer. EXAM: CT ANGIOGRAPHY CHEST WITH CONTRAST TECHNIQUE: Multidetector CT imaging of the chest was performed using the standard protocol during bolus administration of intravenous contrast. Multiplanar CT image reconstructions and MIPs were obtained to evaluate the vascular anatomy. CONTRAST:  3mL ISOVUE-370 IOPAMIDOL (ISOVUE-370) INJECTION 76% COMPARISON:  04/21/2018 PET-CT. 04/11/2018 CT chest. FINDINGS: Cardiovascular: Satisfactory opacification of the pulmonary arteries to the segmental level. No evidence of pulmonary embolism. Normal heart size. No pericardial effusion. Mediastinum/Nodes: Stable right upper paratracheal lymphadenopathy measuring 12 mm short axis and right lower paratracheal lymphadenopathy measuring 11 mm short axis (series 4 image 27 and 31). Ill-defined right paramediastinal soft tissue is contiguous with the  right hilum and stable in comparison with prior CT of chest. No axillary or supraclavicular adenopathy identified. Normal thyroid gland. Normal thoracic esophagus. Lungs/Pleura: Moderate loculated right-sided pleural effusion is successively increased in size. Diffuse ill-defined soft tissue in the right para mediastinum and hilar region is stable in distribution from prior CT of the chest. There is interval increased atelectasis of the right middle lobe  with near complete collapse. Stable platelike atelectasis within the lower lobes bilaterally. No consolidation or pneumothorax. Stable right retro hilar cavitary masslike soft tissue. Upper Abdomen: No acute abnormality. Musculoskeletal: No chest wall abnormality. No acute or significant osseous findings. Review of the MIP images confirms the above findings. IMPRESSION: 1. Interval increased atelectasis of the right middle lobe with near complete collapse. 2. No pulmonary embolus identified. 3. Increasing moderate loculated right-sided pleural effusion. 4. Stable right paramediastinal radiation fibrosis, ill-defined necrotic mass in the right retro hilar region, and mediastinal adenopathy from PET-CT. Electronically Signed   By: Kristine Garbe M.D.   On: 04/28/2018 19:24        Scheduled Meds: . dexamethasone  4 mg Intravenous Q12H  . dicyclomine  20 mg Oral Q6H  . DULoxetine  60 mg Oral Daily  . enoxaparin (LOVENOX) injection  40 mg Subcutaneous QHS  . guaiFENesin  600 mg Oral BID  . insulin aspart  0-5 Units Subcutaneous QHS  . insulin aspart  0-9 Units Subcutaneous TID WC  . ipratropium-albuterol  3 mL Nebulization TID  . oxyCODONE  20 mg Oral TID  . rOPINIRole  0.5 mg Oral QHS  . sucralfate  1 g Oral QID   Continuous Infusions: . sodium chloride 75 mL/hr at 04/29/18 0842     LOS: 1 day   Time spent= 25 mins    Rollins Wrightson Arsenio Loader, MD [facility]  If 7PM-7AM, please contact night-coverage [email or web address] 04/29/2018, 12:47 PM

## 2018-04-30 ENCOUNTER — Inpatient Hospital Stay (HOSPITAL_COMMUNITY): Payer: 59

## 2018-04-30 LAB — GLUCOSE, CAPILLARY
GLUCOSE-CAPILLARY: 90 mg/dL (ref 70–99)
Glucose-Capillary: 114 mg/dL — ABNORMAL HIGH (ref 70–99)
Glucose-Capillary: 104 mg/dL — ABNORMAL HIGH (ref 70–99)
Glucose-Capillary: 100 mg/dL — ABNORMAL HIGH (ref 70–99)

## 2018-04-30 LAB — GLUCOSE, PLEURAL OR PERITONEAL FLUID: Glucose, Fluid: 94 mg/dL

## 2018-04-30 LAB — BODY FLUID CELL COUNT WITH DIFFERENTIAL
Eos, Fluid: 10 %
Lymphs, Fluid: 37 %
MONOCYTE-MACROPHAGE-SEROUS FLUID: 7 % — AB (ref 50–90)
Neutrophil Count, Fluid: 43 % — ABNORMAL HIGH (ref 0–25)
Total Nucleated Cell Count, Fluid: 195 cu mm (ref 0–1000)

## 2018-04-30 LAB — PROTEIN, PLEURAL OR PERITONEAL FLUID: Total protein, fluid: 4.3 g/dL

## 2018-04-30 MED ORDER — LIDOCAINE HCL 1 % IJ SOLN
INTRAMUSCULAR | Status: AC
  Filled 2018-04-30: qty 20

## 2018-04-30 NOTE — Progress Notes (Addendum)
PROCEDURE SUMMARY:  Successful US guided diagnostic and therapeutic thoracentesis. Yielded 200 mL of yellow fluid. Pt tolerated procedure well. No immediate complications.  Specimen was sent for labs. CXR ordered.  EBL < 5 mL Of note, fluid in chest appears locuated by [place].   Docia Barrier PA-C 04/30/2018 11:05 AM

## 2018-04-30 NOTE — Progress Notes (Signed)
PROGRESS NOTE    Kelsi Benham  ZOX:[id number] DOB: 1965-08-01 DOA: 04/28/2018 PCP: Redmond School, MD   Brief Narrative:  53 year old with history of metastatic non-small cell lung cancer on palliative chemotherapy came to the hospital with worsening of shortness of breath.  Patient has known malignancy in the right hilum leading to collapse of right middle lobe in the past.  This time similar symptoms and findings confirmed on CTA.  Yesterday patient was advised to take daily dexamethasone 4 mg and Lasix 20 mg.  But due to worsening of symptoms patient comes back to the hospital.  CT chest showed loculated effusion therefore IR was consulted.  Thoracentesis retrieved about 200 cc fluid.  Unable to perform bronchial stents at this hospital therefore will need outpatient referral.   Assessment & Plan:   Principal Problem:   Acute respiratory failure with hypoxia ([place]) Active Problems:   Metastatic non-small cell lung cancer ([place])   Port-A-Cath in place   Pleural effusion  Acute respiratory distress with hypoxia Loculated right-sided pleural effusion, moderate Right middle lobe atelectasis, postobstructive Metastatic non-small cell lung cancer - On 2 L nasal cannula saturating greater than 90%.  Continue incentive spirometry -Dexamethasone twice daily, insulin sliding scale -Oncology recommended evaluation from pulmonary for possible bronchial stent.  But after my discussion with pulmonary-we do not do this at this hospital.  Patient is currently much better therefore can follow-up outpatient from the standpoint and will need referral to a different center where this can be performed -Appreciate IR for their thoracentesis, about 200 cc of fluid retrieved.  Specimen has been sent. - Provide supportive care. -Bronchodilators as needed. - Ambulate as much as possible  Hyponatremia -Improved with fluids.  Anxiety/depression -Continue Xanax and Cymbalta   DVT prophylaxis:  Lovenox Code Status: DNR Family Communication: None at bedside Disposition Plan: Plan after thoracentesis would be to ambulate patient around to see how she does and if continues to do well then we will discharge her tomorrow.  Consultants:   Oncology consultation placed  Procedures:   Thoracentesis 2/9  Antimicrobials:   None   Subjective: Still has little exertional shortness of breath.  Does not feel back to her baseline yet overall is making improvement.  Review of Systems Otherwise negative except as per HPI, including: General = no fevers, chills, dizziness, malaise, fatigue HEENT/EYES = negative for pain, redness, loss of vision, double vision, blurred vision, loss of hearing, sore throat, hoarseness, dysphagia Cardiovascular= negative for chest pain, palpitation, murmurs, lower extremity swelling Respiratory/lungs= negative for shortness of breath, cough, hemoptysis, wheezing, mucus production Gastrointestinal= negative for nausea, vomiting,, abdominal pain, melena, hematemesis Genitourinary= negative for Dysuria, Hematuria, Change in Urinary Frequency MSK = Negative for arthralgia, myalgias, Back Pain, Joint swelling  Neurology= Negative for headache, seizures, numbness, tingling  Psychiatry= Negative for anxiety, depression, suicidal and homocidal ideation Allergy/Immunology= Medication/Food allergy as listed  Skin= Negative for Rash, lesions, ulcers, itching   Objective: Vitals:   04/29/18 2104 04/29/18 2132 04/30/18 0601 04/30/18 0900  BP:  121/86 103/70   Pulse:  97 66   Resp:  16 16   Temp:  98 F (36.7 C) 98.2 F (36.8 C)   TempSrc:  Oral Oral   SpO2: 95% 97% 95% 99%  Weight:      Height:        Intake/Output Summary (Last 24 hours) at 04/30/2018 1122 Last data filed at 04/30/2018 0925 Gross per 24 hour  Intake 720 ml  Output -  Net 720 ml  Filed Weights   04/28/18 1548  Weight: 81.6 kg    Examination:  Constitutional: NAD, calm,  comfortable Eyes: PERRL, lids and conjunctivae normal ENMT: Mucous membranes are moist. Posterior pharynx clear of any exudate or lesions.Normal dentition.  Neck: normal, supple, no masses, no thyromegaly Respiratory: Diminished breath sounds on the right side Cardiovascular: Regular rate and rhythm, no murmurs / rubs / gallops. No extremity edema. 2+ pedal pulses. No carotid bruits.  Abdomen: no tenderness, no masses palpated. No hepatosplenomegaly. Bowel sounds positive.  Musculoskeletal: no clubbing / cyanosis. No joint deformity upper and lower extremities. Good ROM, no contractures. Normal muscle tone.  Skin: no rashes, lesions, ulcers. No induration Neurologic: CN 2-12 grossly intact. Sensation intact, DTR normal. Strength 5/5 in all 4.  Psychiatric: Normal judgment and insight. Alert and oriented x 3. Normal mood.  Right chest wall catheter noted   Data Reviewed:   CBC: Recent Labs  Lab 04/25/18 1144 04/28/18 1720 04/29/18 0501  WBC 6.5 13.0* 10.7*  NEUTROABS 4.4 11.8*  --   HGB 9.7* 10.8* 9.4*  HCT 30.7* 35.4* 31.2*  MCV 95.6 95.4 96.9  PLT 523* 576* 621*   Basic Metabolic Panel: Recent Labs  Lab 04/25/18 1144 04/28/18 1720 04/29/18 0501  NA 137 128* 136  K 2.9* 4.4 4.0  CL 95* 90* 97*  CO2 34* 28 31  GLUCOSE 103* 131* 123*  BUN 4* 8 7  CREATININE 0.52 0.43* 0.39*  CALCIUM 8.5* 8.5* 8.7*   GFR: Estimated Creatinine Clearance: 89.4 mL/min (A) (by C-G formula based on SCr of 0.39 mg/dL (L)). Liver Function Tests: Recent Labs  Lab 04/25/18 1144 04/28/18 1720 04/29/18 0501  AST 662* 31 28  ALT 326* 92* 69*  ALKPHOS 938* 670* 565*  BILITOT 0.6 0.8 0.8  PROT 6.7 7.4 6.7  ALBUMIN 2.2* 2.8* 2.3*   No results for input(s): LIPASE, AMYLASE in the last 168 hours. No results for input(s): AMMONIA in the last 168 hours. Coagulation Profile: No results for input(s): INR, PROTIME in the last 168 hours. Cardiac Enzymes: No results for input(s): CKTOTAL, CKMB,  CKMBINDEX, TROPONINI in the last 168 hours. BNP (last 3 results) No results for input(s): PROBNP in the last 8760 hours. HbA1C: No results for input(s): HGBA1C in the last 72 hours. CBG: Recent Labs  Lab 04/29/18 1350 04/29/18 1702 04/29/18 2130 04/30/18 0759  GLUCAP 92 122* 151* 114*   Lipid Profile: No results for input(s): CHOL, HDL, LDLCALC, TRIG, CHOLHDL, LDLDIRECT in the last 72 hours. Thyroid Function Tests: No results for input(s): TSH, T4TOTAL, FREET4, T3FREE, THYROIDAB in the last 72 hours. Anemia Panel: No results for input(s): VITAMINB12, FOLATE, FERRITIN, TIBC, IRON, RETICCTPCT in the last 72 hours. Sepsis Labs: No results for input(s): PROCALCITON, LATICACIDVEN in the last 168 hours.  No results found for this or any previous visit (from the past 240 hour(s)).       Radiology Studies: Dg Chest 2 View  Result Date: 04/28/2018 CLINICAL DATA:  Shortness of breath. EXAM: CHEST - 2 VIEW COMPARISON:  PET-CT dated April 21, 2018. FINDINGS: Unchanged right chest wall port catheter. Normal heart size. Unchanged right hilar fullness and architectural distortion. Small right pleural effusion appears decreased in size when compared to prior PET. Unchanged right lower lobe atelectasis. No consolidation or pneumothorax. No acute osseous abnormality. IMPRESSION: 1.  No active cardiopulmonary disease. 2. Unchanged right hilar fullness and architectural distortion corresponding to known lung cancer post radiation changes. 3. Decreased now small right pleural effusion. Electronically  Signed   By: Titus Dubin M.D.   On: 04/28/2018 17:12   Ct Angio Chest Pe W/cm &/or Wo Cm  Result Date: 04/28/2018 CLINICAL DATA:  53 y/o F; shortness of breath. History of stage IV lung cancer. EXAM: CT ANGIOGRAPHY CHEST WITH CONTRAST TECHNIQUE: Multidetector CT imaging of the chest was performed using the standard protocol during bolus administration of intravenous contrast. Multiplanar CT image  reconstructions and MIPs were obtained to evaluate the vascular anatomy. CONTRAST:  36mL ISOVUE-370 IOPAMIDOL (ISOVUE-370) INJECTION 76% COMPARISON:  04/21/2018 PET-CT. 04/11/2018 CT chest. FINDINGS: Cardiovascular: Satisfactory opacification of the pulmonary arteries to the segmental level. No evidence of pulmonary embolism. Normal heart size. No pericardial effusion. Mediastinum/Nodes: Stable right upper paratracheal lymphadenopathy measuring 12 mm short axis and right lower paratracheal lymphadenopathy measuring 11 mm short axis (series 4 image 27 and 31). Ill-defined right paramediastinal soft tissue is contiguous with the right hilum and stable in comparison with prior CT of chest. No axillary or supraclavicular adenopathy identified. Normal thyroid gland. Normal thoracic esophagus. Lungs/Pleura: Moderate loculated right-sided pleural effusion is successively increased in size. Diffuse ill-defined soft tissue in the right para mediastinum and hilar region is stable in distribution from prior CT of the chest. There is interval increased atelectasis of the right middle lobe with near complete collapse. Stable platelike atelectasis within the lower lobes bilaterally. No consolidation or pneumothorax. Stable right retro hilar cavitary masslike soft tissue. Upper Abdomen: No acute abnormality. Musculoskeletal: No chest wall abnormality. No acute or significant osseous findings. Review of the MIP images confirms the above findings. IMPRESSION: 1. Interval increased atelectasis of the right middle lobe with near complete collapse. 2. No pulmonary embolus identified. 3. Increasing moderate loculated right-sided pleural effusion. 4. Stable right paramediastinal radiation fibrosis, ill-defined necrotic mass in the right retro hilar region, and mediastinal adenopathy from PET-CT. Electronically Signed   By: Kristine Garbe M.D.   On: 04/28/2018 19:24        Scheduled Meds: . dexamethasone  4 mg  Intravenous Q12H  . dicyclomine  20 mg Oral Q6H  . DULoxetine  60 mg Oral Daily  . enoxaparin (LOVENOX) injection  40 mg Subcutaneous QHS  . guaiFENesin  600 mg Oral BID  . insulin aspart  0-5 Units Subcutaneous QHS  . insulin aspart  0-9 Units Subcutaneous TID WC  . ipratropium-albuterol  3 mL Nebulization TID  . lidocaine      . oxyCODONE  20 mg Oral TID  . rOPINIRole  0.5 mg Oral QHS  . sucralfate  1 g Oral QID   Continuous Infusions: . sodium chloride 75 mL/hr at 04/30/18 0946     LOS: 2 days   Time spent= 25 mins    Ankit Arsenio Loader, MD [facility]  If 7PM-7AM, please contact night-coverage [email or web address] 04/30/2018, 11:22 AM

## 2018-05-01 LAB — GRAM STAIN

## 2018-05-01 LAB — GLUCOSE, CAPILLARY
GLUCOSE-CAPILLARY: 99 mg/dL (ref 70–99)
Glucose-Capillary: 94 mg/dL (ref 70–99)

## 2018-05-01 LAB — PH, BODY FLUID: pH, Body Fluid: 7.6

## 2018-05-01 LAB — PATHOLOGIST SMEAR REVIEW

## 2018-05-01 MED ORDER — HEPARIN SOD (PORK) LOCK FLUSH 100 UNIT/ML IV SOLN
500.0000 [IU] | INTRAVENOUS | Status: AC | PRN
  Administered 2018-05-01: 500 [IU]
  Filled 2018-05-01: qty 5

## 2018-05-01 NOTE — Discharge Summary (Signed)
Physician Discharge Summary  Cozy Veale EXH:[id number] DOB: [birth year] DOA: 04/28/2018  PCP: Redmond School, MD  Admit date: 04/28/2018 Discharge date: 05/01/2018  Admitted From: Home Disposition: Home  Recommendations for Outpatient Follow-up:  1. Follow up with PCP in 1-2 weeks 2. Please obtain BMP/CBC in one week your next doctors visit.  3. Would recommend outpatient follow-up with pulmonary at a center where they are able to place bronchial stent.  Outpatient primary care physician or an oncologist can help make this arrangements. 4. Follow-up outpatient with oncology in 1 week  Home Health: None Equipment/Devices: None Discharge Condition: Stable CODE STATUS: Full code Diet recommendation: Regular  Brief/Interim Summary: 53 year old with history of metastatic non-small cell lung cancer on palliative chemotherapy came to the hospital with worsening of shortness of breath.  Patient has known malignancy in the right hilum leading to collapse of right middle lobe in the past.  This time similar symptoms and findings confirmed on CTA.  Yesterday patient was advised to take daily dexamethasone 4 mg and Lasix 20 mg.  But due to worsening of symptoms patient comes back to the hospital.  CT chest showed loculated effusion therefore IR was consulted.  Thoracentesis retrieved about 200 cc fluid.  Unable to perform bronchial stents at this hospital therefore will need outpatient referral.  Patient did much better during the hospitalization.  Today she is stable for discharge with outpatient follow-up recommendations as stated above.  Discharge Diagnoses:  Principal Problem:   Acute respiratory failure with hypoxia (HCC) Active Problems:   Metastatic non-small cell lung cancer ([place])   Port-A-Cath in place   Pleural effusion  Acute respiratory distress with hypoxia, resolved Loculated right-sided pleural effusion, moderate Right middle lobe atelectasis, postobstructive Metastatic  non-small cell lung cancer - Now on room air saturating greater than 95%.  Ambulating in the hallway. -Continue daily prednisone. -Unable to perform bronchial stents at this facility.  Patient will need outpatient referral to a different center where this is possible.  Currently patient is on room air therefore nonemergent.  This can be arranged by outpatient primary care provider or oncology.  I have notified Dr. Annamaria Boots from oncology about this as well. -Appreciate IR for their thoracentesis, about 200 cc of fluid retrieved.  Hyponatremia, back to baseline -Improved with fluids.  Anxiety/depression -Continue Xanax and Cymbalta  On Lovenox during the hospitalization DNR/DNI Discharge today in stable condition.  No family at bedside  Discharge Instructions   Allergies as of 05/01/2018      Reactions   Codeine Other (See Comments)   Migraine headache      Medication List    TAKE these medications   albuterol 4 MG tablet Commonly known as:  PROVENTIL Take 4 mg by mouth 2 (two) times daily.   albuterol 108 (90 Base) MCG/ACT inhaler Commonly known as:  PROVENTIL HFA Inhale 2 puffs into the lungs 2 (two) times daily.   ALPRAZolam 0.25 MG tablet Commonly known as:  XANAX TAKE 1 TABLET BY MOUTH AT BEDTIME AS NEEDED FOR ANXIETY What changed:  See the new instructions.   amoxicillin-clavulanate 875-125 MG tablet Commonly known as:  AUGMENTIN Take 1 tablet by mouth 2 (two) times daily.   butalbital-acetaminophen-caffeine 50-325-40 MG tablet Commonly known as:  FIORICET, ESGIC Take 1-2 tablets by mouth every 6 (six) hours as needed for headache.   CALCIUM-MAGNESIUM-ZINC PO Take 1 tablet by mouth daily.   dexamethasone 4 MG tablet Commonly known as:  DECADRON Take 1 tab two times a day the  day before Alimta chemo. Take 2 tabs the day after chemo, then take 2 tabs two times a day for 2 days.   dicyclomine 20 MG tablet Commonly known as:  BENTYL Take 1 tablet (20 mg total)  by mouth every 6 (six) hours.   DULoxetine 60 MG capsule Commonly known as:  CYMBALTA Take 60 mg by mouth daily.   folic acid 1 MG tablet Commonly known as:  FOLVITE Take 1 tablet (1 mg total) by mouth daily. Start 5-7 days before Alimta chemotherapy. Continue until 21 days after Alimta completed.   furosemide 20 MG tablet Commonly known as:  LASIX Take 1 tablet (20 mg total) by mouth daily as needed. What changed:  reasons to take this   gabapentin 300 MG capsule Commonly known as:  NEURONTIN Take 300 mg by mouth daily as needed (for pain).   HYDROcodone-acetaminophen 5-325 MG tablet Commonly known as:  NORCO Take 1 tablet by mouth every 6 (six) hours as needed for moderate pain.   HYDROcodone-homatropine 5-1.5 MG/5ML syrup Commonly known as:  HYCODAN Take 5 mLs by mouth every 6 (six) hours as needed for cough.   ipratropium-albuterol 0.5-2.5 (3) MG/3ML Soln Commonly known as:  DUONEB Take 3 mLs by nebulization every 4 (four) hours as needed. What changed:  reasons to take this   lidocaine-prilocaine cream Commonly known as:  EMLA Apply 1 application topically as needed. Apply to port one hour prior to port access/chemo   magic mouthwash w/lidocaine Soln Take 5 mLs by mouth 3 (three) times daily. Swish and spit 3 x daily   magic mouthwash w/lidocaine Soln Take 5 mLs by mouth 3 (three) times daily.   mirtazapine 7.5 MG tablet Commonly known as:  REMERON Take 1 tablet (7.5 mg total) by mouth at bedtime. What changed:    when to take this  reasons to take this   multivitamin tablet Take 1 tablet by mouth daily.   omeprazole 20 MG capsule Commonly known as:  PRILOSEC Take 1 capsule (20 mg total) by mouth 2 (two) times daily before a meal.   ondansetron 8 MG tablet Commonly known as:  ZOFRAN TAKE 1 TABLET BY MOUTH EVERY 8 HOURS AS NEEDED FOR NAUSEA AND VOMITING   Oxycodone HCl 10 MG Tabs Take 20 mg by mouth 3 (three) times daily.   potassium chloride SA 20  MEQ tablet Commonly known as:  K-DUR,KLOR-CON Take 1 tablet (20 mEq total) by mouth daily.   prochlorperazine 10 MG tablet Commonly known as:  COMPAZINE Take 1 tablet (10 mg total) by mouth every 6 (six) hours as needed for nausea or vomiting.   prochlorperazine 10 MG tablet Commonly known as:  COMPAZINE TAKE 1 TABLET BY MOUTH EVERY 6 HOURS AS NEEDED FOR NAUSEA AND VOMITING   rOPINIRole 0.5 MG tablet Commonly known as:  REQUIP Take 0.5 mg by mouth at bedtime.   sucralfate 1 g tablet Commonly known as:  CARAFATE TAKE 1 TABLET(1 GRAM) BY MOUTH FOUR TIMES DAILY What changed:  See the new instructions.   traMADol 50 MG tablet Commonly known as:  ULTRAM Take 1 tablet (50 mg total) by mouth every 6 (six) hours as needed.   TRELEGY ELLIPTA 100-62.5-25 MCG/INH Aepb Generic drug:  Fluticasone-Umeclidin-Vilant Inhale 1 puff into the lungs daily.      Follow-up Information    Redmond School, MD. Schedule an appointment as soon as possible for a visit in 1 week(s).   Specialty:  Internal Medicine Contact information: [address]  [place] [address] [phone]        Truitt Merle, MD. Schedule an appointment as soon as possible for a visit in 1 week(s).   Specialties:  Hematology, Oncology Contact information: [address] [place] [address] [phone]          Allergies  Allergen Reactions  . Codeine Other (See Comments)    Migraine headache    You were cared for by a hospitalist during your hospital stay. If you have any questions about your discharge medications or the care you received while you were in the hospital after you are discharged, you can call the unit and asked to speak with the hospitalist on call if the hospitalist that took care of you is not available. Once you are discharged, your primary care physician will handle any further medical issues. Please note that no refills for any discharge medications will be authorized once  you are discharged, as it is imperative that you return to your primary care physician (or establish a relationship with a primary care physician if you do not have one) for your aftercare needs so that they can reassess your need for medications and monitor your lab values.  Consultations:  Interventional radiology  Curb sided oncology and pulmonary   Procedures/Studies: Dg Chest 1 View  Result Date: 04/30/2018 CLINICAL DATA:  Thoracentesis follow-up EXAM: CHEST  1 VIEW COMPARISON:  04/28/2017 FINDINGS: Treated lung cancer on the right with stable volume loss and hilar distortion/thickening. Mild atelectasis in the left lower lung. No pneumothorax after thoracentesis. No visible residual pleural fluid. Normal heart size. IMPRESSION: No acute finding after thoracentesis. Electronically Signed   By: Monte Fantasia M.D.   On: 04/30/2018 12:23   Dg Chest 2 View  Result Date: 04/28/2018 CLINICAL DATA:  Shortness of breath. EXAM: CHEST - 2 VIEW COMPARISON:  PET-CT dated April 21, 2018. FINDINGS: Unchanged right chest wall port catheter. Normal heart size. Unchanged right hilar fullness and architectural distortion. Small right pleural effusion appears decreased in size when compared to prior PET. Unchanged right lower lobe atelectasis. No consolidation or pneumothorax. No acute osseous abnormality. IMPRESSION: 1.  No active cardiopulmonary disease. 2. Unchanged right hilar fullness and architectural distortion corresponding to known lung cancer post radiation changes. 3. Decreased now small right pleural effusion. Electronically Signed   By: Titus Dubin M.D.   On: 04/28/2018 17:12   Ct Angio Chest Pe W/cm &/or Wo Cm  Result Date: 04/28/2018 CLINICAL DATA:  53 y/o F; shortness of breath. History of stage IV lung cancer. EXAM: CT ANGIOGRAPHY CHEST WITH CONTRAST TECHNIQUE: Multidetector CT imaging of the chest was performed using the standard protocol during bolus administration of intravenous  contrast. Multiplanar CT image reconstructions and MIPs were obtained to evaluate the vascular anatomy. CONTRAST:  79mL ISOVUE-370 IOPAMIDOL (ISOVUE-370) INJECTION 76% COMPARISON:  04/21/2018 PET-CT. 04/11/2018 CT chest. FINDINGS: Cardiovascular: Satisfactory opacification of the pulmonary arteries to the segmental level. No evidence of pulmonary embolism. Normal heart size. No pericardial effusion. Mediastinum/Nodes: Stable right upper paratracheal lymphadenopathy measuring 12 mm short axis and right lower paratracheal lymphadenopathy measuring 11 mm short axis (series 4 image 27 and 31). Ill-defined right paramediastinal soft tissue is contiguous with the right hilum and stable in comparison with prior CT of chest. No axillary or supraclavicular adenopathy identified. Normal thyroid gland. Normal thoracic esophagus. Lungs/Pleura: Moderate loculated right-sided pleural effusion is successively increased in size. Diffuse ill-defined soft tissue in the right para mediastinum and hilar region is stable in  distribution from prior CT of the chest. There is interval increased atelectasis of the right middle lobe with near complete collapse. Stable platelike atelectasis within the lower lobes bilaterally. No consolidation or pneumothorax. Stable right retro hilar cavitary masslike soft tissue. Upper Abdomen: No acute abnormality. Musculoskeletal: No chest wall abnormality. No acute or significant osseous findings. Review of the MIP images confirms the above findings. IMPRESSION: 1. Interval increased atelectasis of the right middle lobe with near complete collapse. 2. No pulmonary embolus identified. 3. Increasing moderate loculated right-sided pleural effusion. 4. Stable right paramediastinal radiation fibrosis, ill-defined necrotic mass in the right retro hilar region, and mediastinal adenopathy from PET-CT. Electronically Signed   By: Kristine Garbe M.D.   On: 04/28/2018 19:24   Ct Angio Chest Pe W Or Wo  Contrast  Result Date: 04/11/2018 CLINICAL DATA:  Shortness of breath for 3 days. History of right lung cancer. Chemotherapy ongoing. EXAM: CT ANGIOGRAPHY CHEST WITH CONTRAST TECHNIQUE: Multidetector CT imaging of the chest was performed using the standard protocol during bolus administration of intravenous contrast. Multiplanar CT image reconstructions and MIPs were obtained to evaluate the vascular anatomy. CONTRAST:  45mL ISOVUE-370 IOPAMIDOL (ISOVUE-370) INJECTION 76% COMPARISON:  Chest CT 03/10/2018 and 01/10/2018. FINDINGS: Cardiovascular: The pulmonary arteries are well opacified with contrast to the level of the subsegmental branches. There is no evidence of acute pulmonary embolism. Right IJ Port-A-Cath extends into the mid right atrium. Minimal atherosclerosis of the aorta and coronary arteries. The heart size is normal. There is no pericardial effusion. Mediastinum/Nodes: A right paratracheal node has mildly enlarged, measuring 14 mm short axis on image 28/4 (previously 10 mm). Additional small mediastinal and right hilar lymph nodes are stable. There is no axillary adenopathy. The thyroid gland, trachea and esophagus demonstrate no significant findings. Lungs/Pleura: There is a new small dependent right pleural effusion with associated mild dependent right lung atelectasis. There are progressive radiation changes in the right perihilar region, extending into the right middle lobe. Interval enlargement of the cavitary retro hilar right lung mass, measuring 4.6 x 3.6 cm on image 54/6. This cavitary mass communicates with the bronchus intermedius. Patchy left basilar atelectasis has also mildly increased. No other focal nodules identified. Upper abdomen: Right adrenal nodule has enlarged, measuring 2.0 x 1.7 cm on image 94/4. This was not present on the original CT from 10/11/2017 and is suspicious for metastatic disease. The visualized left adrenal gland and remainder of the upper abdomen appear stable.  There are postsurgical changes in the left subphrenic area. Musculoskeletal/Chest wall: There is no chest wall mass or suspicious osseous finding. Review of the MIP images confirms the above findings. IMPRESSION: 1. No evidence of acute pulmonary embolism. 2. Progressive radiation changes in the right perihilar region. Cavitary retro hilar mass and right paratracheal lymph node have enlarged, suspicious for local recurrence/disease progression. 3. Enlarging right adrenal nodule suspicious for metastatic disease. 4. New right pleural effusion with increased atelectasis at both lung bases. 5. These results will be called to the ordering clinician or representative by the Radiology Department at the imaging location. Electronically Signed   By: Richardean Sale M.D.   On: 04/11/2018 16:36   Nm Pet Image Initial (pi) Skull Base To Thigh  Result Date: 04/21/2018 CLINICAL DATA:  Subsequent treatment strategy for lung cancer. EXAM: NUCLEAR MEDICINE PET SKULL BASE TO THIGH TECHNIQUE: 8.8 mCi F-18 FDG was injected intravenously. Full-ring PET imaging was performed from the skull base to thigh after the radiotracer. CT data was obtained and  used for attenuation correction and anatomic localization. Fasting blood glucose: 110 mg/dl COMPARISON:  Chest CT 04/11/2018 FINDINGS: Mediastinal blood pool activity: SUV max 2.1 NECK: No hypermetabolic lymph nodes in the neck. Incidental CT findings: none CHEST: 13 mm short axis high right paratracheal node (84/1) is hypermetabolic with SUV max = 66.0. 11 mm short axis precarinal node (63/4) is also hypermetabolic with SUV max = 7.7. Marked hypermetabolism is identified in the right hilum with SUV max = 10.2. Inferior right hilar node measuring 8 mm short axis (63/0) is hypermetabolic with SUV max = 4.6. 6 mm anterior right juxta diaphragmatic lymph node (16/0) is hypermetabolic with SUV max = 2.8. Incidental CT findings: Loculated right pleural effusion has progressed since  04/11/2018. Radiation scarring noted in the right parahilar lung. ABDOMEN/PELVIS: 1.9 cm right adrenal nodule is markedly hypermetabolic with SUV max = 10.9. No evidence for hypermetabolic metastases in the liver. No hypermetabolic lymphadenopathy in the abdomen or pelvis. Incidental CT findings: Surgical changes are noted in the stomach. There is abdominal aortic atherosclerosis without aneurysm. SKELETON: No focal hypermetabolic activity to suggest skeletal metastasis. Incidental CT findings: Status post lumbar fusion. Bones diffusely demineralized. IMPRESSION: 1. Marked hypermetabolism in the amorphous soft tissue involving the right hilum. This represents the central component of the apparent radiation scarring. 2. Hypermetabolic mediastinal nodal metastases with nodal metastases identified in the anterior right juxta diaphragmatic fat. 3. Hypermetabolic right adrenal metastasis. 4. Interval progression of loculated right pleural effusion. Electronically Signed   By: Misty Stanley M.D.   On: 04/21/2018 21:23   US Thoracentesis Asp Pleural Space W/img Guide  Result Date: 04/30/2018 INDICATION: Patient with history of lung cancer, now with right pleural effusion. Request is made for diagnostic and therapeutic thoracentesis. EXAM: ULTRASOUND GUIDED DIAGNOSTIC AND THERAPEUTIC RIGHT THORACENTESIS MEDICATIONS: 10 mL 1% lidocaine COMPLICATIONS: None immediate. PROCEDURE: An ultrasound guided thoracentesis was thoroughly discussed with the patient and questions answered. The benefits, risks, alternatives and complications were also discussed. The patient understands and wishes to proceed with the procedure. Written consent was obtained. Ultrasound was performed to localize and mark an adequate pocket of fluid in the right chest. The area was then prepped and draped in the normal sterile fashion. 1% Lidocaine was used for local anesthesia. Under ultrasound guidance a Safe-T-Centesis catheter was introduced.  Thoracentesis was performed. The catheter was removed and a dressing applied. FINDINGS: A total of approximately 200 mL of yellow fluid was removed. Samples were sent to the laboratory as requested by the clinical team. IMPRESSION: Successful ultrasound guided diagnostic and therapeutic right thoracentesis yielding 200 mL of pleural fluid. Read by: Brynda Greathouse PA-C Electronically Signed   By: Corrie Mckusick D.O.   On: 04/30/2018 14:50      Subjective: Feels much better, ambulating in the hallway without any issues.  She is on room air saturating greater than 90%.  Wishes to go home.   Discharge Exam: Vitals:   05/01/18 0521 05/01/18 0910  BP: 121/83   Pulse: 61   Resp: 15   Temp: 97.9 F (36.6 C)   SpO2: 95% 95%   Vitals:   04/30/18 2119 04/30/18 2241 05/01/18 0521 05/01/18 0910  BP: 118/83  121/83   Pulse: 71  61   Resp: 16  15   Temp: 98.5 F (36.9 C)  97.9 F (36.6 C)   TempSrc: Oral  Oral   SpO2: 98% 98% 95% 95%  Weight:      Height:  General: Pt is alert, awake, not in acute distress Cardiovascular: RRR, S1/S2 +, no rubs, no gallops Respiratory: CTA bilaterally, no wheezing, no rhonchi Abdominal: Soft, NT, ND, bowel sounds + Extremities: no edema, no cyanosis    The results of significant diagnostics from this hospitalization (including imaging, microbiology, ancillary and laboratory) are listed below for reference.     Microbiology: Recent Results (from the past 240 hour(s))  Gram stain     Status: None   Collection Time: 04/30/18 11:22 AM  Result Value Ref Range Status   Specimen Description THORACENTESIS FLUID  Final   Special Requests   Final    NONE Performed at [facility], [place] [address], [place], [place] [address]    Gram Stain   Final    WBC PRESENT, PREDOMINANTLY PMN NO ORGANISMS SEEN CYTOSPIN SMEAR    Report Status 05/01/2018 FINAL  Final  Culture, body fluid-bottle     Status: None (Preliminary result)    Collection Time: 04/30/18 11:22 AM  Result Value Ref Range Status   Specimen Description THORACENTESIS FLUID  Final   Special Requests   Final    BOTTLES DRAWN AEROBIC AND ANAEROBIC Blood Culture adequate volume Performed at [facility], [place] [address] [place], [place] [address]    Culture PENDING  Incomplete   Report Status PENDING  Incomplete     Labs: BNP (last 3 results) No results for input(s): BNP in the last 8760 hours. Basic Metabolic Panel: Recent Labs  Lab 04/25/18 1144 04/28/18 1720 04/29/18 0501  NA 137 128* 136  K 2.9* 4.4 4.0  CL 95* 90* 97*  CO2 34* 28 31  GLUCOSE 103* 131* 123*  BUN 4* 8 7  CREATININE 0.52 0.43* 0.39*  CALCIUM 8.5* 8.5* 8.7*   Liver Function Tests: Recent Labs  Lab 04/25/18 1144 04/28/18 1720 04/29/18 0501  AST 662* 31 28  ALT 326* 92* 69*  ALKPHOS 938* 670* 565*  BILITOT 0.6 0.8 0.8  PROT 6.7 7.4 6.7  ALBUMIN 2.2* 2.8* 2.3*   No results for input(s): LIPASE, AMYLASE in the last 168 hours. No results for input(s): AMMONIA in the last 168 hours. CBC: Recent Labs  Lab 04/25/18 1144 04/28/18 1720 04/29/18 0501  WBC 6.5 13.0* 10.7*  NEUTROABS 4.4 11.8*  --   HGB 9.7* 10.8* 9.4*  HCT 30.7* 35.4* 31.2*  MCV 95.6 95.4 96.9  PLT 523* 576* 552*   Cardiac Enzymes: No results for input(s): CKTOTAL, CKMB, CKMBINDEX, TROPONINI in the last 168 hours. BNP: Invalid input(s): POCBNP CBG: Recent Labs  Lab 04/30/18 0759 04/30/18 1208 04/30/18 1718 04/30/18 2122 05/01/18 0745  GLUCAP 114* 90 104* 100* 99   D-Dimer No results for input(s): DDIMER in the last 72 hours. Hgb A1c No results for input(s): HGBA1C in the last 72 hours. Lipid Profile No results for input(s): CHOL, HDL, LDLCALC, TRIG, CHOLHDL, LDLDIRECT in the last 72 hours. Thyroid function studies No results for input(s): TSH, T4TOTAL, T3FREE, THYROIDAB in the last 72 hours.  Invalid input(s): FREET3 Anemia work up No results for input(s): VITAMINB12,  FOLATE, FERRITIN, TIBC, IRON, RETICCTPCT in the last 72 hours. Urinalysis    Component Value Date/Time   PROTEINUR <30 (A) 04/11/2018 1346   Sepsis Labs Invalid input(s): PROCALCITONIN,  WBC,  LACTICIDVEN Microbiology Recent Results (from the past 240 hour(s))  Gram stain     Status: None   Collection Time: 04/30/18 11:22 AM  Result Value Ref Range Status   Specimen Description THORACENTESIS  FLUID  Final   Special Requests   Final    NONE Performed at [facility] [address] [place], [place] [address]    Gram Stain   Final    WBC PRESENT, PREDOMINANTLY PMN NO ORGANISMS SEEN CYTOSPIN SMEAR    Report Status 05/01/2018 FINAL  Final  Culture, body fluid-bottle     Status: None (Preliminary result)   Collection Time: 04/30/18 11:22 AM  Result Value Ref Range Status   Specimen Description THORACENTESIS FLUID  Final   Special Requests   Final    BOTTLES DRAWN AEROBIC AND ANAEROBIC Blood Culture adequate volume Performed at [facility], [place] [address], [place], [place] [address]    Culture PENDING  Incomplete   Report Status PENDING  Incomplete     Time coordinating discharge:  I have spent 35 minutes face to face with the patient and on the ward discussing the patients care, assessment, plan and disposition with other care givers. >50% of the time was devoted counseling the patient about the risks and benefits of treatment/Discharge disposition and coordinating care.   SIGNED:   Damita Lack, MD  [facility] 05/01/2018, 10:07 AM   If 7PM-7AM, please contact night-coverage [email or web address]

## 2018-05-02 ENCOUNTER — Telehealth: Payer: Self-pay | Admitting: Medical

## 2018-05-02 ENCOUNTER — Inpatient Hospital Stay: Payer: 59

## 2018-05-02 ENCOUNTER — Inpatient Hospital Stay (HOSPITAL_BASED_OUTPATIENT_CLINIC_OR_DEPARTMENT_OTHER): Payer: 59 | Admitting: Medical

## 2018-05-02 VITALS — BP 136/88 | HR 79 | Temp 98.9°F | Resp 18 | Ht 67.0 in | Wt 180.0 lb

## 2018-05-02 DIAGNOSIS — C7971 Secondary malignant neoplasm of right adrenal gland: Secondary | ICD-10-CM

## 2018-05-02 DIAGNOSIS — Z95828 Presence of other vascular implants and grafts: Secondary | ICD-10-CM

## 2018-05-02 DIAGNOSIS — F419 Anxiety disorder, unspecified: Secondary | ICD-10-CM | POA: Diagnosis not present

## 2018-05-02 DIAGNOSIS — Z87891 Personal history of nicotine dependence: Secondary | ICD-10-CM | POA: Insufficient documentation

## 2018-05-02 DIAGNOSIS — C3490 Malignant neoplasm of unspecified part of unspecified bronchus or lung: Secondary | ICD-10-CM

## 2018-05-02 DIAGNOSIS — R51 Headache: Secondary | ICD-10-CM | POA: Diagnosis not present

## 2018-05-02 DIAGNOSIS — J9811 Atelectasis: Secondary | ICD-10-CM | POA: Insufficient documentation

## 2018-05-02 DIAGNOSIS — Z452 Encounter for adjustment and management of vascular access device: Secondary | ICD-10-CM | POA: Diagnosis present

## 2018-05-02 DIAGNOSIS — J90 Pleural effusion, not elsewhere classified: Secondary | ICD-10-CM | POA: Diagnosis not present

## 2018-05-02 DIAGNOSIS — G8929 Other chronic pain: Secondary | ICD-10-CM | POA: Insufficient documentation

## 2018-05-02 DIAGNOSIS — E876 Hypokalemia: Secondary | ICD-10-CM | POA: Insufficient documentation

## 2018-05-02 DIAGNOSIS — K219 Gastro-esophageal reflux disease without esophagitis: Secondary | ICD-10-CM | POA: Insufficient documentation

## 2018-05-02 DIAGNOSIS — Z5112 Encounter for antineoplastic immunotherapy: Secondary | ICD-10-CM | POA: Diagnosis present

## 2018-05-02 DIAGNOSIS — Z8249 Family history of ischemic heart disease and other diseases of the circulatory system: Secondary | ICD-10-CM | POA: Insufficient documentation

## 2018-05-02 DIAGNOSIS — Z7982 Long term (current) use of aspirin: Secondary | ICD-10-CM | POA: Insufficient documentation

## 2018-05-02 DIAGNOSIS — C3431 Malignant neoplasm of lower lobe, right bronchus or lung: Secondary | ICD-10-CM | POA: Diagnosis not present

## 2018-05-02 DIAGNOSIS — F329 Major depressive disorder, single episode, unspecified: Secondary | ICD-10-CM | POA: Insufficient documentation

## 2018-05-02 DIAGNOSIS — Z9884 Bariatric surgery status: Secondary | ICD-10-CM | POA: Insufficient documentation

## 2018-05-02 DIAGNOSIS — Z79899 Other long term (current) drug therapy: Secondary | ICD-10-CM | POA: Diagnosis not present

## 2018-05-02 DIAGNOSIS — R110 Nausea: Secondary | ICD-10-CM | POA: Diagnosis not present

## 2018-05-02 DIAGNOSIS — Z9221 Personal history of antineoplastic chemotherapy: Secondary | ICD-10-CM | POA: Diagnosis not present

## 2018-05-02 DIAGNOSIS — Z923 Personal history of irradiation: Secondary | ICD-10-CM | POA: Diagnosis not present

## 2018-05-02 DIAGNOSIS — M549 Dorsalgia, unspecified: Secondary | ICD-10-CM | POA: Insufficient documentation

## 2018-05-02 DIAGNOSIS — C771 Secondary and unspecified malignant neoplasm of intrathoracic lymph nodes: Secondary | ICD-10-CM | POA: Diagnosis not present

## 2018-05-02 DIAGNOSIS — Z5111 Encounter for antineoplastic chemotherapy: Secondary | ICD-10-CM | POA: Diagnosis present

## 2018-05-02 LAB — CBC WITH DIFFERENTIAL (CANCER CENTER ONLY)
Abs Immature Granulocytes: 0.05 10*3/uL (ref 0.00–0.07)
Basophils Absolute: 0 10*3/uL (ref 0.0–0.1)
Basophils Relative: 0 %
EOS PCT: 0 %
Eosinophils Absolute: 0 10*3/uL (ref 0.0–0.5)
HCT: 31.3 % — ABNORMAL LOW (ref 36.0–46.0)
Hemoglobin: 9.8 g/dL — ABNORMAL LOW (ref 12.0–15.0)
Immature Granulocytes: 1 %
Lymphocytes Relative: 7 %
Lymphs Abs: 0.8 10*3/uL (ref 0.7–4.0)
MCH: 29.7 pg (ref 26.0–34.0)
MCHC: 31.3 g/dL (ref 30.0–36.0)
MCV: 94.8 fL (ref 80.0–100.0)
MONO ABS: 0.7 10*3/uL (ref 0.1–1.0)
Monocytes Relative: 6 %
Neutro Abs: 9.1 10*3/uL — ABNORMAL HIGH (ref 1.7–7.7)
Neutrophils Relative %: 86 %
Platelet Count: 528 10*3/uL — ABNORMAL HIGH (ref 150–400)
RBC: 3.3 MIL/uL — ABNORMAL LOW (ref 3.87–5.11)
RDW: 13.2 % (ref 11.5–15.5)
WBC Count: 10.6 10*3/uL — ABNORMAL HIGH (ref 4.0–10.5)
nRBC: 0 % (ref 0.0–0.2)

## 2018-05-02 LAB — CMP (CANCER CENTER ONLY)
ALT: 36 U/L (ref 0–44)
AST: 25 U/L (ref 15–41)
Albumin: 2.4 g/dL — ABNORMAL LOW (ref 3.5–5.0)
Alkaline Phosphatase: 531 U/L — ABNORMAL HIGH (ref 38–126)
Anion gap: 9 (ref 5–15)
BUN: 8 mg/dL (ref 6–20)
CO2: 29 mmol/L (ref 22–32)
Calcium: 8.4 mg/dL — ABNORMAL LOW (ref 8.9–10.3)
Chloride: 102 mmol/L (ref 98–111)
Creatinine: 0.59 mg/dL (ref 0.44–1.00)
GFR, Est AFR Am: >60 mL/min (ref >60)
GFR, Estimated: >60 mL/min (ref >60)
Glucose, Bld: 98 mg/dL (ref 70–99)
Potassium: 3.5 mmol/L (ref 3.5–5.1)
Sodium: 140 mmol/L (ref 135–145)
TOTAL PROTEIN: 6.7 g/dL (ref 6.5–8.1)
Total Bilirubin: <0.2 mg/dL — ABNORMAL LOW (ref 0.3–1.2)

## 2018-05-02 LAB — TSH: TSH: 0.754 u[IU]/mL (ref 0.308–3.960)

## 2018-05-02 MED ORDER — PROCHLORPERAZINE MALEATE 10 MG PO TABS
ORAL_TABLET | ORAL | Status: AC
  Filled 2018-05-02: qty 1

## 2018-05-02 MED ORDER — PROCHLORPERAZINE MALEATE 10 MG PO TABS
10.0000 mg | ORAL_TABLET | Freq: Once | ORAL | Status: AC
  Administered 2018-05-02: 10 mg via ORAL

## 2018-05-02 MED ORDER — HEPARIN SOD (PORK) LOCK FLUSH 100 UNIT/ML IV SOLN
500.0000 [IU] | Freq: Once | INTRAVENOUS | Status: AC | PRN
  Administered 2018-05-02: 500 [IU]
  Filled 2018-05-02: qty 5

## 2018-05-02 MED ORDER — SODIUM CHLORIDE 0.9% FLUSH
10.0000 mL | INTRAVENOUS | Status: DC | PRN
  Administered 2018-05-02: 10 mL
  Filled 2018-05-02: qty 10

## 2018-05-02 MED ORDER — SODIUM CHLORIDE 0.9 % IV SOLN
Freq: Once | INTRAVENOUS | Status: AC
  Administered 2018-05-02: 15:00:00 via INTRAVENOUS
  Filled 2018-05-02: qty 250

## 2018-05-02 MED ORDER — SODIUM CHLORIDE 0.9 % IV SOLN
500.0000 mg/m2 | Freq: Once | INTRAVENOUS | Status: AC
  Administered 2018-05-02: 1000 mg via INTRAVENOUS
  Filled 2018-05-02: qty 40

## 2018-05-02 NOTE — Patient Instructions (Addendum)
[facility] Discharge Instructions for Patients Receiving Chemotherapy  Today you received the following chemotherapy agents: Alimta  To help prevent nausea and vomiting after your treatment, we encourage you to take your nausea medication as directed.   If you develop nausea and vomiting that is not controlled by your nausea medication, call the clinic.   BELOW ARE SYMPTOMS THAT SHOULD BE REPORTED IMMEDIATELY:  *FEVER GREATER THAN 100.5 F  *CHILLS WITH OR WITHOUT FEVER  NAUSEA AND VOMITING THAT IS NOT CONTROLLED WITH YOUR NAUSEA MEDICATION  *UNUSUAL SHORTNESS OF BREATH  *UNUSUAL BRUISING OR BLEEDING  TENDERNESS IN MOUTH AND THROAT WITH OR WITHOUT PRESENCE OF ULCERS  *URINARY PROBLEMS  *BOWEL PROBLEMS  UNUSUAL RASH Items with * indicate a potential emergency and should be followed up as soon as possible.  Feel free to call the clinic should you have any questions or concerns. The clinic phone number is [phone].  Please show the [place] [place] at check-in to the Emergency Department and triage nurse.  Pemetrexed injection What is this medicine? PEMETREXED (PEM e TREX ed) is a chemotherapy drug used to treat lung cancers like non-small cell lung cancer and mesothelioma. It may also be used to treat other cancers. This medicine may be used for other purposes; ask your health care provider or pharmacist if you have questions. COMMON BRAND NAME(S): Alimta What should I tell my health care provider before I take this medicine? They need to know if you have any of these conditions: -infection (especially a virus infection such as chickenpox, cold sores, or herpes) -kidney disease -low blood counts, like low white cell, platelet, or red cell counts -lung or breathing disease, like asthma -radiation therapy -an unusual or allergic reaction to pemetrexed, other medicines, foods, dyes, or preservative -pregnant or trying to get pregnant -breast-feeding How  should I use this medicine? This drug is given as an infusion into a vein. It is administered in a hospital or clinic by a specially trained health care professional. Talk to your pediatrician regarding the use of this medicine in children. Special care may be needed. Overdosage: If you think you have taken too much of this medicine contact a poison control center or emergency room at once. NOTE: This medicine is only for you. Do not share this medicine with others. What if I miss a dose? It is important not to miss your dose. Call your doctor or health care professional if you are unable to keep an appointment. What may interact with this medicine? This medicine may interact with the following medications: -Ibuprofen This list may not describe all possible interactions. Give your health care provider a list of all the medicines, herbs, non-prescription drugs, or dietary supplements you use. Also tell them if you smoke, drink alcohol, or use illegal drugs. Some items may interact with your medicine. What should I watch for while using this medicine? Visit your doctor for checks on your progress. This drug may make you feel generally unwell. This is not uncommon, as chemotherapy can affect healthy cells as well as cancer cells. Report any side effects. Continue your course of treatment even though you feel ill unless your doctor tells you to stop. In some cases, you may be given additional medicines to help with side effects. Follow all directions for their use. Call your doctor or health care professional for advice if you get a fever, chills or sore throat, or other symptoms of a cold or flu. Do not treat yourself.  This drug decreases your body's ability to fight infections. Try to avoid being around people who are sick. This medicine may increase your risk to bruise or bleed. Call your doctor or health care professional if you notice any unusual bleeding. Be careful brushing and flossing your teeth  or using a toothpick because you may get an infection or bleed more easily. If you have any dental work done, tell your dentist you are receiving this medicine. Avoid taking products that contain aspirin, acetaminophen, ibuprofen, naproxen, or ketoprofen unless instructed by your doctor. These medicines may hide a fever. Call your doctor or health care professional if you get diarrhea or mouth sores. Do not treat yourself. To protect your kidneys, drink water or other fluids as directed while you are taking this medicine. Do not become pregnant while taking this medicine or for 6 months after stopping it. Women should inform their doctor if they wish to become pregnant or think they might be pregnant. Men should not father a child while taking this medicine and for 3 months after stopping it. This may interfere with the ability to father a child. You should talk to your doctor or health care professional if you are concerned about your fertility. There is a potential for serious side effects to an unborn child. Talk to your health care professional or pharmacist for more information. Do not breast-feed an infant while taking this medicine or for 1 week after stopping it. What side effects may I notice from receiving this medicine? Side effects that you should report to your doctor or health care professional as soon as possible: -allergic reactions like skin rash, itching or hives, swelling of the face, lips, or tongue -breathing problems -redness, blistering, peeling or loosening of the skin, including inside the mouth -signs and symptoms of bleeding such as bloody or black, tarry stools; red or dark-brown urine; spitting up blood or brown material that looks like coffee grounds; red spots on the skin; unusual bruising or bleeding from the eye, gums, or nose -signs and symptoms of infection like fever or chills; cough; sore throat; pain or trouble passing urine -signs and symptoms of kidney injury like  trouble passing urine or change in the amount of urine -signs and symptoms of liver injury like dark yellow or brown urine; general ill feeling or flu-like symptoms; light-colored stools; loss of appetite; nausea; right upper belly pain; unusually weak or tired; yellowing of the eyes or skin Side effects that usually do not require medical attention (report to your doctor or health care professional if they continue or are bothersome): -constipation -mouth sores -nausea, vomiting -unusually weak or tired This list may not describe all possible side effects. Call your doctor for medical advice about side effects. You may report side effects to FDA at 1-800-FDA-1088. Where should I keep my medicine? This drug is given in a hospital or clinic and will not be stored at home. NOTE: This sheet is a summary. It may not cover all possible information. If you have questions about this medicine, talk to your doctor, pharmacist, or health care provider.  2019 [employer] (2017-04-27 16:11:33)

## 2018-05-02 NOTE — Progress Notes (Signed)
[facility] Progress Note   Yvonne Lang [id number] 09-11-1965 53 y.o.  Yvonne Lang is managed by Dr. Truitt Merle  Actively treated with chemotherapy/immunotherapy/hormonal therapy: yes  Current Therapy: Alimta  Last Treated: 05/02/2018 (cycle 1, day 1)  Next Scheduled Appointment: 05/09/2018   Assessment: Plan:    Metastatic non-small cell lung cancer ([place])   Metastatic non-small cell lung cancer: The patient presents for cycle 1, day 1 of Alimta which is to be dosed today.  We will proceed with her treatment today.  She is scheduled to return on 05/10/2018 for labs and follow-up with Dr. Burr Medico.  Please see After Visit Summary for patient specific instructions.  Future Appointments  Date Time Provider [place]  05/02/2018  3:00 PM [facility] INFUSION [facility] None  05/10/2018  2:30 PM [facility] LAB 4 [facility] None  05/10/2018  2:45 PM [facility] [place] None  05/10/2018  3:15 PM Truitt Merle, MD [facility] None  05/23/2018  1:45 PM [facility] LAB 3 [facility] None  05/23/2018  2:00 PM [facility] [place] FLUSH [facility] None  05/23/2018  2:15 PM Truitt Merle, MD [facility] None  05/23/2018  3:30 PM [facility] INFUSION [facility] None    No orders of the defined types were placed in this encounter.      Subjective:   Patient ID:  Yvonne Lang is a 53 y.o. (DOB 1965-10-26) female.  Chief Complaint:  Chief Complaint  Patient presents with  . Follow-up    HPI Yvonne Lang is a 53 year old female with a diagnosis of a metastatic non-small cell lung cancer who is managed by Dr. Burr Medico and presents to the clinic today for consideration of cycle 1, day 1 of Alimta.  The patient is status post a thoracentesis.  She reports that she is breathing significantly better and is feeling good overall.  She denies any issues of concern today and is eager to start her treatment.  Medications: I have reviewed the patient's current  medications.  Allergies:  Allergies  Allergen Reactions  . Codeine Other (See Comments)    Migraine headache    Past Medical History:  Diagnosis Date  . Anxiety   . Bronchitis   . COPD (chronic obstructive pulmonary disease) ([place])   . Depression   . GERD (gastroesophageal reflux disease)   . H/O gastric bypass    1999  . Irritable bowel syndrome   . Lung cancer [facility])     Past Surgical History:  Procedure Laterality Date  . BACK SURGERY    . CHOLECYSTECTOMY    . COLON SURGERY     for a kink in her colon and 12 inchs removed  . COLONOSCOPY N/A 12/13/2013   Procedure: COLONOSCOPY;  Surgeon: Rogene Houston, MD;  Location: [facility];  Service: Endoscopy;  Laterality: N/A;  200  . ESOPHAGOGASTRODUODENOSCOPY N/A 12/13/2013   Procedure: ESOPHAGOGASTRODUODENOSCOPY (EGD);  Surgeon: Rogene Houston, MD;  Location: [facility];  Service: Endoscopy;  Laterality: N/A;  . Gastric Bypas  2000  . IR IMAGING GUIDED PORT INSERTION  11/14/2017    Family History  Problem Relation Age of Onset  . Other Mother        has a pacemaker; rectal prolapse; rods in legs; lung fibrosis   . Other Father        heart gave out  . Hypertension Sister   . Bronchitis Sister   . Heart attack Paternal Grandmother   . COPD Maternal Grandmother   . Emphysema Maternal Grandmother   .  Alcoholism Maternal Grandfather   . Cancer Sister        rare skin cancer  . Cancer Maternal Uncle 7       colon cancer   . Colon cancer Maternal Uncle     Social History   Socioeconomic History  . Marital status: Single    Spouse name: Not on file  . Number of children: Not on file  . Years of education: Not on file  . Highest education level: Not on file  Occupational History  . Not on file  Social Needs  . Financial resource strain: Not on file  . Food insecurity:    Worry: Not on file    Inability: Not on file  . Transportation needs:    Medical: Not on file    Non-medical: Not on file  Tobacco Use   . Smoking status: Former Smoker    Packs/day: 1.00    Years: 40.00    Pack years: 40.00    Types: Cigarettes  . Smokeless tobacco: Never Used  . Tobacco comment: Pack a day x 30 yrs. Trying to stop smoking using nicotine gum.   Substance and Sexual Activity  . Alcohol use: No  . Drug use: No  . Sexual activity: Not Currently    Birth control/protection: Post-menopausal  Lifestyle  . Physical activity:    Days per week: Not on file    Minutes per session: Not on file  . Stress: Not on file  Relationships  . Social connections:    Talks on phone: Not on file    Gets together: Not on file    Attends religious service: Not on file    Active member of club or organization: Not on file    Attends meetings of clubs or organizations: Not on file    Relationship status: Not on file  . Intimate partner violence:    Fear of current or ex partner: Not on file    Emotionally abused: Not on file    Physically abused: Not on file    Forced sexual activity: Not on file  Other Topics Concern  . Not on file  Social History Narrative  . Not on file    Past Medical History, Surgical history, Social history, and Family history were reviewed and updated as appropriate.   Please see review of systems for further details on the patient's review from today.   Review of Systems:  Review of Systems  Constitutional: Negative for chills, diaphoresis and fever.  HENT: Negative for trouble swallowing and voice change.   Respiratory: Negative for cough, chest tightness, shortness of breath and wheezing.   Cardiovascular: Negative for chest pain and palpitations.  Gastrointestinal: Negative for abdominal pain, constipation, diarrhea, nausea and vomiting.  Musculoskeletal: Negative for back pain and myalgias.  Neurological: Negative for dizziness, light-headedness and headaches.    Objective:   Physical Exam:  BP 136/88 (BP Location: Left Arm, Patient Position: Sitting)   Pulse 79   Temp 98.9  F (37.2 C) (Oral)   Resp 18   Ht 5\' 7"  (1.702 m)   Wt 180 lb (81.6 kg)   SpO2 99%   BMI 28.19 kg/m  ECOG: 1  Physical Exam Constitutional:      General: She is not in acute distress.    Appearance: She is not diaphoretic.  HENT:     Head: Normocephalic and atraumatic.  Cardiovascular:     Rate and Rhythm: Normal rate and regular rhythm.  Heart sounds: Normal heart sounds. No murmur. No friction rub. No gallop.   Pulmonary:     Effort: Pulmonary effort is normal. No respiratory distress.     Breath sounds: Normal breath sounds. No wheezing or rales.  Abdominal:     General: Bowel sounds are normal. There is no distension.     Tenderness: There is no abdominal tenderness. There is no guarding.  Musculoskeletal:     Right lower leg: No edema.     Left lower leg: No edema.  Skin:    General: Skin is warm and dry.     Findings: No erythema or rash.  Neurological:     Mental Status: She is alert.     Gait: Gait normal.  Psychiatric:        Mood and Affect: Mood normal.        Behavior: Behavior normal.        Thought Content: Thought content normal.        Judgment: Judgment normal.     Lab Review:     Component Value Date/Time   NA 136 04/29/2018 0501   K 4.0 04/29/2018 0501   CL 97 (L) 04/29/2018 0501   CO2 31 04/29/2018 0501   GLUCOSE 123 (H) 04/29/2018 0501   BUN 7 04/29/2018 0501   CREATININE 0.39 (L) 04/29/2018 0501   CREATININE 0.52 04/25/2018 1144   CREATININE 0.59 10/25/2013 1617   CALCIUM 8.7 (L) 04/29/2018 0501   PROT 6.7 04/29/2018 0501   ALBUMIN 2.3 (L) 04/29/2018 0501   AST 28 04/29/2018 0501   AST 662 (HH) 04/25/2018 1144   ALT 69 (H) 04/29/2018 0501   ALT 326 (HH) 04/25/2018 1144   ALKPHOS 565 (H) 04/29/2018 0501   BILITOT 0.8 04/29/2018 0501   BILITOT 0.6 04/25/2018 1144   GFRNONAA >60 04/29/2018 0501   GFRNONAA >60 04/25/2018 1144   GFRAA >60 04/29/2018 0501   GFRAA >60 04/25/2018 1144       Component Value Date/Time   WBC 10.6  (H) 05/02/2018 1328   WBC 10.7 (H) 04/29/2018 0501   RBC 3.30 (L) 05/02/2018 1328   HGB 9.8 (L) 05/02/2018 1328   HCT 31.3 (L) 05/02/2018 1328   PLT 528 (H) 05/02/2018 1328   MCV 94.8 05/02/2018 1328   MCH 29.7 05/02/2018 1328   MCHC 31.3 05/02/2018 1328   RDW 13.2 05/02/2018 1328   LYMPHSABS 0.8 05/02/2018 1328   MONOABS 0.7 05/02/2018 1328   EOSABS 0.0 05/02/2018 1328   BASOSABS 0.0 05/02/2018 1328   -------------------------------  Imaging from last 24 hours (if applicable):  Radiology interpretation: Dg Chest 1 View  Result Date: 04/30/2018 CLINICAL DATA:  Thoracentesis follow-up EXAM: CHEST  1 VIEW COMPARISON:  04/28/2017 FINDINGS: Treated lung cancer on the right with stable volume loss and hilar distortion/thickening. Mild atelectasis in the left lower lung. No pneumothorax after thoracentesis. No visible residual pleural fluid. Normal heart size. IMPRESSION: No acute finding after thoracentesis. Electronically Signed   By: Monte Fantasia M.D.   On: 04/30/2018 12:23   Dg Chest 2 View  Result Date: 04/28/2018 CLINICAL DATA:  Shortness of breath. EXAM: CHEST - 2 VIEW COMPARISON:  PET-CT dated April 21, 2018. FINDINGS: Unchanged right chest wall port catheter. Normal heart size. Unchanged right hilar fullness and architectural distortion. Small right pleural effusion appears decreased in size when compared to prior PET. Unchanged right lower lobe atelectasis. No consolidation or pneumothorax. No acute osseous abnormality. IMPRESSION: 1.  No active cardiopulmonary disease. 2.  Unchanged right hilar fullness and architectural distortion corresponding to known lung cancer post radiation changes. 3. Decreased now small right pleural effusion. Electronically Signed   By: Titus Dubin M.D.   On: 04/28/2018 17:12   Ct Angio Chest Pe W/cm &/or Wo Cm  Result Date: 04/28/2018 CLINICAL DATA:  53 y/o F; shortness of breath. History of stage IV lung cancer. EXAM: CT ANGIOGRAPHY CHEST WITH  CONTRAST TECHNIQUE: Multidetector CT imaging of the chest was performed using the standard protocol during bolus administration of intravenous contrast. Multiplanar CT image reconstructions and MIPs were obtained to evaluate the vascular anatomy. CONTRAST:  49mL ISOVUE-370 IOPAMIDOL (ISOVUE-370) INJECTION 76% COMPARISON:  04/21/2018 PET-CT. 04/11/2018 CT chest. FINDINGS: Cardiovascular: Satisfactory opacification of the pulmonary arteries to the segmental level. No evidence of pulmonary embolism. Normal heart size. No pericardial effusion. Mediastinum/Nodes: Stable right upper paratracheal lymphadenopathy measuring 12 mm short axis and right lower paratracheal lymphadenopathy measuring 11 mm short axis (series 4 image 27 and 31). Ill-defined right paramediastinal soft tissue is contiguous with the right hilum and stable in comparison with prior CT of chest. No axillary or supraclavicular adenopathy identified. Normal thyroid gland. Normal thoracic esophagus. Lungs/Pleura: Moderate loculated right-sided pleural effusion is successively increased in size. Diffuse ill-defined soft tissue in the right para mediastinum and hilar region is stable in distribution from prior CT of the chest. There is interval increased atelectasis of the right middle lobe with near complete collapse. Stable platelike atelectasis within the lower lobes bilaterally. No consolidation or pneumothorax. Stable right retro hilar cavitary masslike soft tissue. Upper Abdomen: No acute abnormality. Musculoskeletal: No chest wall abnormality. No acute or significant osseous findings. Review of the MIP images confirms the above findings. IMPRESSION: 1. Interval increased atelectasis of the right middle lobe with near complete collapse. 2. No pulmonary embolus identified. 3. Increasing moderate loculated right-sided pleural effusion. 4. Stable right paramediastinal radiation fibrosis, ill-defined necrotic mass in the right retro hilar region, and  mediastinal adenopathy from PET-CT. Electronically Signed   By: Kristine Garbe M.D.   On: 04/28/2018 19:24   Ct Angio Chest Pe W Or Wo Contrast  Result Date: 04/11/2018 CLINICAL DATA:  Shortness of breath for 3 days. History of right lung cancer. Chemotherapy ongoing. EXAM: CT ANGIOGRAPHY CHEST WITH CONTRAST TECHNIQUE: Multidetector CT imaging of the chest was performed using the standard protocol during bolus administration of intravenous contrast. Multiplanar CT image reconstructions and MIPs were obtained to evaluate the vascular anatomy. CONTRAST:  54mL ISOVUE-370 IOPAMIDOL (ISOVUE-370) INJECTION 76% COMPARISON:  Chest CT 03/10/2018 and 01/10/2018. FINDINGS: Cardiovascular: The pulmonary arteries are well opacified with contrast to the level of the subsegmental branches. There is no evidence of acute pulmonary embolism. Right IJ Port-A-Cath extends into the mid right atrium. Minimal atherosclerosis of the aorta and coronary arteries. The heart size is normal. There is no pericardial effusion. Mediastinum/Nodes: A right paratracheal node has mildly enlarged, measuring 14 mm short axis on image 28/4 (previously 10 mm). Additional small mediastinal and right hilar lymph nodes are stable. There is no axillary adenopathy. The thyroid gland, trachea and esophagus demonstrate no significant findings. Lungs/Pleura: There is a new small dependent right pleural effusion with associated mild dependent right lung atelectasis. There are progressive radiation changes in the right perihilar region, extending into the right middle lobe. Interval enlargement of the cavitary retro hilar right lung mass, measuring 4.6 x 3.6 cm on image 54/6. This cavitary mass communicates with the bronchus intermedius. Patchy left basilar atelectasis has also mildly increased.  No other focal nodules identified. Upper abdomen: Right adrenal nodule has enlarged, measuring 2.0 x 1.7 cm on image 94/4. This was not present on the  original CT from 10/11/2017 and is suspicious for metastatic disease. The visualized left adrenal gland and remainder of the upper abdomen appear stable. There are postsurgical changes in the left subphrenic area. Musculoskeletal/Chest wall: There is no chest wall mass or suspicious osseous finding. Review of the MIP images confirms the above findings. IMPRESSION: 1. No evidence of acute pulmonary embolism. 2. Progressive radiation changes in the right perihilar region. Cavitary retro hilar mass and right paratracheal lymph node have enlarged, suspicious for local recurrence/disease progression. 3. Enlarging right adrenal nodule suspicious for metastatic disease. 4. New right pleural effusion with increased atelectasis at both lung bases. 5. These results will be called to the ordering clinician or representative by the Radiology Department at the imaging location. Electronically Signed   By: Richardean Sale M.D.   On: 04/11/2018 16:36   Nm Pet Image Initial (pi) Skull Base To Thigh  Result Date: 04/21/2018 CLINICAL DATA:  Subsequent treatment strategy for lung cancer. EXAM: NUCLEAR MEDICINE PET SKULL BASE TO THIGH TECHNIQUE: 8.8 mCi F-18 FDG was injected intravenously. Full-ring PET imaging was performed from the skull base to thigh after the radiotracer. CT data was obtained and used for attenuation correction and anatomic localization. Fasting blood glucose: 110 mg/dl COMPARISON:  Chest CT 04/11/2018 FINDINGS: Mediastinal blood pool activity: SUV max 2.1 NECK: No hypermetabolic lymph nodes in the neck. Incidental CT findings: none CHEST: 13 mm short axis high right paratracheal node (38/1) is hypermetabolic with SUV max = 82.9. 11 mm short axis precarinal node (63/4) is also hypermetabolic with SUV max = 7.7. Marked hypermetabolism is identified in the right hilum with SUV max = 10.2. Inferior right hilar node measuring 8 mm short axis (93/7) is hypermetabolic with SUV max = 4.6. 6 mm anterior right juxta  diaphragmatic lymph node (16/9) is hypermetabolic with SUV max = 2.8. Incidental CT findings: Loculated right pleural effusion has progressed since 04/11/2018. Radiation scarring noted in the right parahilar lung. ABDOMEN/PELVIS: 1.9 cm right adrenal nodule is markedly hypermetabolic with SUV max = 67.8. No evidence for hypermetabolic metastases in the liver. No hypermetabolic lymphadenopathy in the abdomen or pelvis. Incidental CT findings: Surgical changes are noted in the stomach. There is abdominal aortic atherosclerosis without aneurysm. SKELETON: No focal hypermetabolic activity to suggest skeletal metastasis. Incidental CT findings: Status post lumbar fusion. Bones diffusely demineralized. IMPRESSION: 1. Marked hypermetabolism in the amorphous soft tissue involving the right hilum. This represents the central component of the apparent radiation scarring. 2. Hypermetabolic mediastinal nodal metastases with nodal metastases identified in the anterior right juxta diaphragmatic fat. 3. Hypermetabolic right adrenal metastasis. 4. Interval progression of loculated right pleural effusion. Electronically Signed   By: Misty Stanley M.D.   On: 04/21/2018 21:23   US Thoracentesis Asp Pleural Space W/img Guide  Result Date: 04/30/2018 INDICATION: Patient with history of lung cancer, now with right pleural effusion. Request is made for diagnostic and therapeutic thoracentesis. EXAM: ULTRASOUND GUIDED DIAGNOSTIC AND THERAPEUTIC RIGHT THORACENTESIS MEDICATIONS: 10 mL 1% lidocaine COMPLICATIONS: None immediate. PROCEDURE: An ultrasound guided thoracentesis was thoroughly discussed with the patient and questions answered. The benefits, risks, alternatives and complications were also discussed. The patient understands and wishes to proceed with the procedure. Written consent was obtained. Ultrasound was performed to localize and mark an adequate pocket of fluid in the right chest. The area was then  prepped and draped in the  normal sterile fashion. 1% Lidocaine was used for local anesthesia. Under ultrasound guidance a Safe-T-Centesis catheter was introduced. Thoracentesis was performed. The catheter was removed and a dressing applied. FINDINGS: A total of approximately 200 mL of yellow fluid was removed. Samples were sent to the laboratory as requested by the clinical team. IMPRESSION: Successful ultrasound guided diagnostic and therapeutic right thoracentesis yielding 200 mL of pleural fluid. Read by: Brynda Greathouse PA-C Electronically Signed   By: Corrie Mckusick D.O.   On: 04/30/2018 14:50        This case was discussed with Dr. Burr Medico. She expressed agreement with my management of this patient.

## 2018-05-02 NOTE — Patient Instructions (Signed)

## 2018-05-02 NOTE — Telephone Encounter (Signed)
Per tanner request to see the patient 2/18.

## 2018-05-02 NOTE — Progress Notes (Signed)
Ok to treat today (05/02/2018) per PA Lucianne Lei.  Pt ambulatory w/belongings to infusion.

## 2018-05-03 ENCOUNTER — Encounter: Payer: Self-pay | Admitting: Hematology

## 2018-05-05 LAB — CULTURE, BODY FLUID W GRAM STAIN -BOTTLE: Culture: NO GROWTH

## 2018-05-05 LAB — CULTURE, BODY FLUID-BOTTLE: SPECIAL REQUESTS: ADEQUATE

## 2018-05-09 ENCOUNTER — Inpatient Hospital Stay (HOSPITAL_BASED_OUTPATIENT_CLINIC_OR_DEPARTMENT_OTHER): Payer: 59 | Admitting: Medical

## 2018-05-09 ENCOUNTER — Inpatient Hospital Stay: Payer: 59

## 2018-05-09 VITALS — BP 101/75 | HR 91 | Temp 98.4°F | Resp 19 | Ht 67.0 in | Wt 179.3 lb

## 2018-05-09 DIAGNOSIS — C3431 Malignant neoplasm of lower lobe, right bronchus or lung: Secondary | ICD-10-CM | POA: Diagnosis not present

## 2018-05-09 DIAGNOSIS — Z95828 Presence of other vascular implants and grafts: Secondary | ICD-10-CM

## 2018-05-09 DIAGNOSIS — C7971 Secondary malignant neoplasm of right adrenal gland: Secondary | ICD-10-CM | POA: Diagnosis not present

## 2018-05-09 DIAGNOSIS — J9811 Atelectasis: Secondary | ICD-10-CM | POA: Diagnosis not present

## 2018-05-09 DIAGNOSIS — C771 Secondary and unspecified malignant neoplasm of intrathoracic lymph nodes: Secondary | ICD-10-CM | POA: Diagnosis not present

## 2018-05-09 DIAGNOSIS — C3490 Malignant neoplasm of unspecified part of unspecified bronchus or lung: Secondary | ICD-10-CM

## 2018-05-09 LAB — CBC WITH DIFFERENTIAL (CANCER CENTER ONLY)
Abs Immature Granulocytes: 0.03 10*3/uL (ref 0.00–0.07)
Basophils Absolute: 0 10*3/uL (ref 0.0–0.1)
Basophils Relative: 0 %
Eosinophils Absolute: 0.2 10*3/uL (ref 0.0–0.5)
Eosinophils Relative: 3 %
HEMATOCRIT: 33.7 % — AB (ref 36.0–46.0)
Hemoglobin: 10.6 g/dL — ABNORMAL LOW (ref 12.0–15.0)
Immature Granulocytes: 0 %
LYMPHS ABS: 1 10*3/uL (ref 0.7–4.0)
Lymphocytes Relative: 14 %
MCH: 29.4 pg (ref 26.0–34.0)
MCHC: 31.5 g/dL (ref 30.0–36.0)
MCV: 93.4 fL (ref 80.0–100.0)
Monocytes Absolute: 0.9 10*3/uL (ref 0.1–1.0)
Monocytes Relative: 13 %
Neutro Abs: 5 10*3/uL (ref 1.7–7.7)
Neutrophils Relative %: 70 %
Platelet Count: 343 10*3/uL (ref 150–400)
RBC: 3.61 MIL/uL — ABNORMAL LOW (ref 3.87–5.11)
RDW: 13.2 % (ref 11.5–15.5)
WBC Count: 7.2 10*3/uL (ref 4.0–10.5)
nRBC: 0 % (ref 0.0–0.2)

## 2018-05-09 LAB — CMP (CANCER CENTER ONLY)
ALT: 90 U/L — AB (ref 0–44)
AST: 40 U/L (ref 15–41)
Albumin: 2.7 g/dL — ABNORMAL LOW (ref 3.5–5.0)
Alkaline Phosphatase: 448 U/L — ABNORMAL HIGH (ref 38–126)
Anion gap: 7 (ref 5–15)
BUN: 8 mg/dL (ref 6–20)
CO2: 26 mmol/L (ref 22–32)
CREATININE: 0.61 mg/dL (ref 0.44–1.00)
Calcium: 8.4 mg/dL — ABNORMAL LOW (ref 8.9–10.3)
Chloride: 99 mmol/L (ref 98–111)
GFR, Est AFR Am: >60 mL/min (ref >60)
GFR, Estimated: >60 mL/min (ref >60)
Glucose, Bld: 96 mg/dL (ref 70–99)
Potassium: 4.3 mmol/L (ref 3.5–5.1)
Sodium: 132 mmol/L — ABNORMAL LOW (ref 135–145)
Total Bilirubin: 0.3 mg/dL (ref 0.3–1.2)
Total Protein: 6.8 g/dL (ref 6.5–8.1)

## 2018-05-09 MED ORDER — SODIUM CHLORIDE 0.9% FLUSH
10.0000 mL | INTRAVENOUS | Status: DC | PRN
  Administered 2018-05-09: 10 mL
  Filled 2018-05-09: qty 10

## 2018-05-09 MED ORDER — HEPARIN SOD (PORK) LOCK FLUSH 100 UNIT/ML IV SOLN
500.0000 [IU] | Freq: Once | INTRAVENOUS | Status: AC | PRN
  Administered 2018-05-09: 500 [IU]
  Filled 2018-05-09: qty 5

## 2018-05-09 NOTE — Progress Notes (Signed)
Pt seen by PA Lucianne Lei only, no RN assessment at this time.  PA Lucianne Lei aware.

## 2018-05-09 NOTE — Patient Instructions (Signed)

## 2018-05-10 ENCOUNTER — Ambulatory Visit: Payer: 59 | Admitting: Hematology

## 2018-05-10 ENCOUNTER — Other Ambulatory Visit: Payer: 59

## 2018-05-10 ENCOUNTER — Encounter: Payer: Self-pay | Admitting: Hematology

## 2018-05-11 ENCOUNTER — Other Ambulatory Visit: Payer: Self-pay

## 2018-05-11 ENCOUNTER — Telehealth: Payer: Self-pay

## 2018-05-11 DIAGNOSIS — C3490 Malignant neoplasm of unspecified part of unspecified bronchus or lung: Secondary | ICD-10-CM

## 2018-05-11 MED ORDER — MAGIC MOUTHWASH W/LIDOCAINE: 5.0000 mL | Freq: Three times a day (TID) | ORAL | 0 refills | Status: DC

## 2018-05-11 NOTE — Telephone Encounter (Signed)
Faxed refill on Magic Mouthwash to [facility] [phone], sent to Him for scanning to chart.

## 2018-05-11 NOTE — Telephone Encounter (Signed)
10:27 am Called patient notifying her of MMW order received from Dr. Burr Medico has ben sent to her pharmacy by collaborative nurse.

## 2018-05-12 NOTE — Progress Notes (Signed)
[facility] Progress Note   Yvonne Lang [id number] 1965-10-25 53 y.o.  Yvonne Lang is managed by Dr. Truitt Merle  Actively treated with chemotherapy/immunotherapy/hormonal therapy: yes  Current therapy: Alimta  Last treated: 05/02/2018 (Cycle 1 Day 1)  Next scheduled appointment with provider: 03 / 03 / 2020  Assessment: Plan:    Metastatic non-small cell lung cancer ([place])   1) Metastatic [place] lung cancer:  The patient presents for follow up having received Cycle 1 Day 1 of Alimta on 05/02/2018.  She will return for consideration of her next cycle of chemotherapy on 05/23/2018.   Please see After Visit Summary for patient specific instructions.  Future Appointments  Date Time Provider Boundary  05/23/2018  1:45 PM [facility] LAB 3 [facility] None  05/23/2018  2:00 PM [facility] FLUSH [facility] None  05/23/2018  2:15 PM Truitt Merle, MD [facility] None  05/23/2018  3:30 PM [facility] INFUSION [facility] None    No orders of the defined types were placed in this encounter.      Subjective:   Patient ID:  Magdalyn Lang is a 53 y.o. (DOB Jan 14, 1966) female.  Chief Complaint:  Chief Complaint  Patient presents with  . Follow-up    HPI [name] is a 53 y.o. female with metastatic [place] lung cancer.  She is managed by Dr. Truitt Merle and presents today s/p Cycle 1 of Alimta which was dosed on 05/02/2018.  She tolerated her treatment well with no acute issues of concern.  Medications: I have reviewed the patient's current medications.  Allergies:  Allergies  Allergen Reactions  . Codeine Other (See Comments)    Migraine headache    Past Medical History:  Diagnosis Date  . Anxiety   . Bronchitis   . COPD (chronic obstructive pulmonary disease) ([place])   . Depression   . GERD (gastroesophageal reflux disease)   . H/O gastric bypass    1999  . Irritable bowel syndrome   . Lung cancer [facility])     Past Surgical History:  Procedure  Laterality Date  . BACK SURGERY    . CHOLECYSTECTOMY    . COLON SURGERY     for a kink in her colon and 12 inchs removed  . COLONOSCOPY N/A 12/13/2013   Procedure: COLONOSCOPY;  Surgeon: Rogene Houston, MD;  Location: [facility];  Service: Endoscopy;  Laterality: N/A;  200  . ESOPHAGOGASTRODUODENOSCOPY N/A 12/13/2013   Procedure: ESOPHAGOGASTRODUODENOSCOPY (EGD);  Surgeon: Rogene Houston, MD;  Location: [facility];  Service: Endoscopy;  Laterality: N/A;  . Gastric Bypas  2000  . IR IMAGING GUIDED PORT INSERTION  11/14/2017    Family History  Problem Relation Age of Onset  . Other Mother        has a pacemaker; rectal prolapse; rods in legs; lung fibrosis   . Other Father        heart gave out  . Hypertension Sister   . Bronchitis Sister   . Heart attack Paternal Grandmother   . COPD Maternal Grandmother   . Emphysema Maternal Grandmother   . Alcoholism Maternal Grandfather   . Cancer Sister        rare skin cancer  . Cancer Maternal Uncle 7       colon cancer   . Colon cancer Maternal Uncle     Social History   Socioeconomic History  . Marital status: Single    Spouse name: Not on file  . Number of children: Not on  file  . Years of education: Not on file  . Highest education level: Not on file  Occupational History  . Not on file  Social Needs  . Financial resource strain: Not on file  . Food insecurity:    Worry: Not on file    Inability: Not on file  . Transportation needs:    Medical: Not on file    Non-medical: Not on file  Tobacco Use  . Smoking status: Former Smoker    Packs/day: 1.00    Years: 40.00    Pack years: 40.00    Types: Cigarettes  . Smokeless tobacco: Never Used  . Tobacco comment: Pack a day x 30 yrs. Trying to stop smoking using nicotine gum.   Substance and Sexual Activity  . Alcohol use: No  . Drug use: No  . Sexual activity: Not Currently    Birth control/protection: Post-menopausal  Lifestyle  . Physical activity:    Days  per week: Not on file    Minutes per session: Not on file  . Stress: Not on file  Relationships  . Social connections:    Talks on phone: Not on file    Gets together: Not on file    Attends religious service: Not on file    Active member of club or organization: Not on file    Attends meetings of clubs or organizations: Not on file    Relationship status: Not on file  . Intimate partner violence:    Fear of current or ex partner: Not on file    Emotionally abused: Not on file    Physically abused: Not on file    Forced sexual activity: Not on file  Other Topics Concern  . Not on file  Social History Narrative  . Not on file    Past Medical History, Surgical history, Social history, and Family history were reviewed and updated as appropriate.   Please see review of systems for further details on the patient's review from today.   Review of Systems:  Review of Systems  Constitutional: Negative for chills, diaphoresis and fever.  HENT: Negative for trouble swallowing and voice change.   Respiratory: Negative for cough, chest tightness, shortness of breath and wheezing.   Cardiovascular: Negative for chest pain and palpitations.  Gastrointestinal: Negative for abdominal pain, constipation, diarrhea, nausea and vomiting.  Musculoskeletal: Negative for back pain and myalgias.  Neurological: Negative for dizziness, light-headedness and headaches.    Objective:   Physical Exam:  BP 101/75 (BP Location: Left Arm, Patient Position: Sitting)   Pulse 91   Temp 98.4 F (36.9 C) (Oral)   Resp 19   Ht 5\' 7"  (1.702 m)   Wt 179 lb 4.8 oz (81.3 kg)   SpO2 98%   BMI 28.08 kg/m  ECOG: 0  Physical Exam Constitutional:      General: She is not in acute distress.    Appearance: She is not diaphoretic.  HENT:     Head: Normocephalic and atraumatic.     Mouth/Throat:     Mouth: Mucous membranes are moist.     Pharynx: Oropharynx is clear. No oropharyngeal exudate or posterior  oropharyngeal erythema.  Eyes:     General: No scleral icterus.       Right eye: No discharge.        Left eye: No discharge.     Conjunctiva/sclera: Conjunctivae normal.  Cardiovascular:     Rate and Rhythm: Normal rate and regular rhythm.  Heart sounds: Normal heart sounds. No murmur. No friction rub. No gallop.   Pulmonary:     Effort: Pulmonary effort is normal. No respiratory distress.     Breath sounds: Normal breath sounds. No wheezing or rales.  Skin:    General: Skin is warm and dry.     Findings: No erythema or rash.  Neurological:     Mental Status: She is alert.     Coordination: Coordination normal.     Gait: Gait normal.  Psychiatric:        Mood and Affect: Mood normal.        Behavior: Behavior normal.        Thought Content: Thought content normal.        Judgment: Judgment normal.     Lab Review:     Component Value Date/Time   NA 132 (L) 05/09/2018 1503   K 4.3 05/09/2018 1503   CL 99 05/09/2018 1503   CO2 26 05/09/2018 1503   GLUCOSE 96 05/09/2018 1503   BUN 8 05/09/2018 1503   CREATININE 0.61 05/09/2018 1503   CREATININE 0.59 10/25/2013 1617   CALCIUM 8.4 (L) 05/09/2018 1503   PROT 6.8 05/09/2018 1503   ALBUMIN 2.7 (L) 05/09/2018 1503   AST 40 05/09/2018 1503   ALT 90 (H) 05/09/2018 1503   ALKPHOS 448 (H) 05/09/2018 1503   BILITOT 0.3 05/09/2018 1503   GFRNONAA >60 05/09/2018 1503   GFRAA >60 05/09/2018 1503       Component Value Date/Time   WBC 7.2 05/09/2018 1503   WBC 10.7 (H) 04/29/2018 0501   RBC 3.61 (L) 05/09/2018 1503   HGB 10.6 (L) 05/09/2018 1503   HCT 33.7 (L) 05/09/2018 1503   PLT 343 05/09/2018 1503   MCV 93.4 05/09/2018 1503   MCH 29.4 05/09/2018 1503   MCHC 31.5 05/09/2018 1503   RDW 13.2 05/09/2018 1503   LYMPHSABS 1.0 05/09/2018 1503   MONOABS 0.9 05/09/2018 1503   EOSABS 0.2 05/09/2018 1503   BASOSABS 0.0 05/09/2018 1503   -------------------------------  Imaging from last 24 hours (if  applicable):  Radiology interpretation: Dg Chest 1 View  Result Date: 04/30/2018 CLINICAL DATA:  Thoracentesis follow-up EXAM: CHEST  1 VIEW COMPARISON:  04/28/2017 FINDINGS: Treated lung cancer on the right with stable volume loss and hilar distortion/thickening. Mild atelectasis in the left lower lung. No pneumothorax after thoracentesis. No visible residual pleural fluid. Normal heart size. IMPRESSION: No acute finding after thoracentesis. Electronically Signed   By: Monte Fantasia M.D.   On: 04/30/2018 12:23   Dg Chest 2 View  Result Date: 04/28/2018 CLINICAL DATA:  Shortness of breath. EXAM: CHEST - 2 VIEW COMPARISON:  PET-CT dated April 21, 2018. FINDINGS: Unchanged right chest wall port catheter. Normal heart size. Unchanged right hilar fullness and architectural distortion. Small right pleural effusion appears decreased in size when compared to prior PET. Unchanged right lower lobe atelectasis. No consolidation or pneumothorax. No acute osseous abnormality. IMPRESSION: 1.  No active cardiopulmonary disease. 2. Unchanged right hilar fullness and architectural distortion corresponding to known lung cancer post radiation changes. 3. Decreased now small right pleural effusion. Electronically Signed   By: Titus Dubin M.D.   On: 04/28/2018 17:12   Ct Angio Chest Pe W/cm &/or Wo Cm  Result Date: 04/28/2018 CLINICAL DATA:  53 y/o F; shortness of breath. History of stage IV lung cancer. EXAM: CT ANGIOGRAPHY CHEST WITH CONTRAST TECHNIQUE: Multidetector CT imaging of the chest was performed using the standard protocol  during bolus administration of intravenous contrast. Multiplanar CT image reconstructions and MIPs were obtained to evaluate the vascular anatomy. CONTRAST:  25mL ISOVUE-370 IOPAMIDOL (ISOVUE-370) INJECTION 76% COMPARISON:  04/21/2018 PET-CT. 04/11/2018 CT chest. FINDINGS: Cardiovascular: Satisfactory opacification of the pulmonary arteries to the segmental level. No evidence of pulmonary  embolism. Normal heart size. No pericardial effusion. Mediastinum/Nodes: Stable right upper paratracheal lymphadenopathy measuring 12 mm short axis and right lower paratracheal lymphadenopathy measuring 11 mm short axis (series 4 image 27 and 31). Ill-defined right paramediastinal soft tissue is contiguous with the right hilum and stable in comparison with prior CT of chest. No axillary or supraclavicular adenopathy identified. Normal thyroid gland. Normal thoracic esophagus. Lungs/Pleura: Moderate loculated right-sided pleural effusion is successively increased in size. Diffuse ill-defined soft tissue in the right para mediastinum and hilar region is stable in distribution from prior CT of the chest. There is interval increased atelectasis of the right middle lobe with near complete collapse. Stable platelike atelectasis within the lower lobes bilaterally. No consolidation or pneumothorax. Stable right retro hilar cavitary masslike soft tissue. Upper Abdomen: No acute abnormality. Musculoskeletal: No chest wall abnormality. No acute or significant osseous findings. Review of the MIP images confirms the above findings. IMPRESSION: 1. Interval increased atelectasis of the right middle lobe with near complete collapse. 2. No pulmonary embolus identified. 3. Increasing moderate loculated right-sided pleural effusion. 4. Stable right paramediastinal radiation fibrosis, ill-defined necrotic mass in the right retro hilar region, and mediastinal adenopathy from PET-CT. Electronically Signed   By: Kristine Garbe M.D.   On: 04/28/2018 19:24   Nm Pet Image Initial (pi) Skull Base To Thigh  Result Date: 04/21/2018 CLINICAL DATA:  Subsequent treatment strategy for lung cancer. EXAM: NUCLEAR MEDICINE PET SKULL BASE TO THIGH TECHNIQUE: 8.8 mCi F-18 FDG was injected intravenously. Full-ring PET imaging was performed from the skull base to thigh after the radiotracer. CT data was obtained and used for attenuation  correction and anatomic localization. Fasting blood glucose: 110 mg/dl COMPARISON:  Chest CT 04/11/2018 FINDINGS: Mediastinal blood pool activity: SUV max 2.1 NECK: No hypermetabolic lymph nodes in the neck. Incidental CT findings: none CHEST: 13 mm short axis high right paratracheal node (02/5) is hypermetabolic with SUV max = 42.7. 11 mm short axis precarinal node (63/4) is also hypermetabolic with SUV max = 7.7. Marked hypermetabolism is identified in the right hilum with SUV max = 10.2. Inferior right hilar node measuring 8 mm short axis (06/2) is hypermetabolic with SUV max = 4.6. 6 mm anterior right juxta diaphragmatic lymph node (37/6) is hypermetabolic with SUV max = 2.8. Incidental CT findings: Loculated right pleural effusion has progressed since 04/11/2018. Radiation scarring noted in the right parahilar lung. ABDOMEN/PELVIS: 1.9 cm right adrenal nodule is markedly hypermetabolic with SUV max = 28.3. No evidence for hypermetabolic metastases in the liver. No hypermetabolic lymphadenopathy in the abdomen or pelvis. Incidental CT findings: Surgical changes are noted in the stomach. There is abdominal aortic atherosclerosis without aneurysm. SKELETON: No focal hypermetabolic activity to suggest skeletal metastasis. Incidental CT findings: Status post lumbar fusion. Bones diffusely demineralized. IMPRESSION: 1. Marked hypermetabolism in the amorphous soft tissue involving the right hilum. This represents the central component of the apparent radiation scarring. 2. Hypermetabolic mediastinal nodal metastases with nodal metastases identified in the anterior right juxta diaphragmatic fat. 3. Hypermetabolic right adrenal metastasis. 4. Interval progression of loculated right pleural effusion. Electronically Signed   By: Misty Stanley M.D.   On: 04/21/2018 21:23   US Thoracentesis Asp  Pleural Space W/img Guide  Result Date: 04/30/2018 INDICATION: Patient with history of lung cancer, now with right pleural  effusion. Request is made for diagnostic and therapeutic thoracentesis. EXAM: ULTRASOUND GUIDED DIAGNOSTIC AND THERAPEUTIC RIGHT THORACENTESIS MEDICATIONS: 10 mL 1% lidocaine COMPLICATIONS: None immediate. PROCEDURE: An ultrasound guided thoracentesis was thoroughly discussed with the patient and questions answered. The benefits, risks, alternatives and complications were also discussed. The patient understands and wishes to proceed with the procedure. Written consent was obtained. Ultrasound was performed to localize and mark an adequate pocket of fluid in the right chest. The area was then prepped and draped in the normal sterile fashion. 1% Lidocaine was used for local anesthesia. Under ultrasound guidance a Safe-T-Centesis catheter was introduced. Thoracentesis was performed. The catheter was removed and a dressing applied. FINDINGS: A total of approximately 200 mL of yellow fluid was removed. Samples were sent to the laboratory as requested by the clinical team. IMPRESSION: Successful ultrasound guided diagnostic and therapeutic right thoracentesis yielding 200 mL of pleural fluid. Read by: Brynda Greathouse PA-C Electronically Signed   By: Corrie Mckusick D.O.   On: 04/30/2018 14:50

## 2018-05-18 NOTE — Progress Notes (Signed)
[place]   [phone] Fax:([phone]   Clinic Follow up Note   Patient Care Team: Redmond School, MD as PCP - General (Internal Medicine) 05/23/2018  CHIEF COMPLAINT: Metastatic non-small cell lung cancer  SUMMARY OF ONCOLOGIC HISTORY: Oncology History   Cancer Staging Metastatic non-small cell lung cancer [facility]) Staging form: Lung, AJCC 8th Edition - Clinical stage from 10/17/2017: Stage IV (cT3, cN1, cM1b) - Signed by Truitt Merle, MD on 10/17/2017       Metastatic non-small cell lung cancer ([place])   09/20/2017 Imaging    US Breast Left 09/20/17  IMPRESSION Indeterminte palpable mass measuring 3.2x1.7x2.8 cm  inferior medial to the inframammary fold of the left breast.      09/20/2017 Initial Biopsy    Diagnosis 09/20/17 Soft Tissue Needle Core Biopsy, inferior medial to IMF - ADENOCARCINOMA. - SEE MICROSCOPIC DESCRIPTION.  Microscopic Comment Immunohistochemistry will be performed and reported as an addendum. (JDP:ah 09/23/17) ADDENDUM: Immunohistochemistry shows the tumor is strongly positive with cytokeratin AE1/AE3, cytokeratin 7 and shows moderate weak positivity with CDX-2. The tumor is negative with estrogen receptor, progesterone receptor, GCDFP, GATA-3, Napsin A, TTF-1, WT-1 and cytokeratin 20. The immunophenotype is consistent with metastatic carcinoma. The combination of CDX-2 and cytokeratin 7 positivity raises the possibility of an upper gastrointestinal primary. Clinical correlation is essential.    10/06/2017 Initial Diagnosis    Metastatic adenocarcinoma involving soft tissue with unknown primary site [facility])    10/10/2017 Procedure    Upper Endoscopy by Dr. Silverio Decamp 10/10/17  IMPRESSION - Normal esophagus. - Z-line regular, 35 cm from the incisors. - Gastric bypass with a normal-sized pouch and intact staple line. Gastrojejunal anastomosis characterized by healthy appearing mucosa. - No specimens collected.    10/12/2017 Imaging      CT CAP WO Contrast 10/12/17  IMPRESSION: 7 cm central right lung mass involving the right hilum, with postobstructive collapse of the right middle lobe, highly suspicious for primary bronchogenic carcinoma. 5 cm masslike opacity in the superior segment of the right lower lobe may represent carcinoma or postobstructive pneumonitis. 3.3 cm soft tissue mass in the lower anterior chest wall soft tissues, suspicious for metastatic disease. No evidence of abdominal or pelvic metastatic disease.     10/14/2017 Imaging    MRI Brain 10/14/17  IMPRESSION: 1. No metastatic disease or acute intracranial abnormality. Normal MRI appearance of the brain. 2. 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      10/17/2017 Cancer Staging    Staging form: Lung, AJCC 8th Edition - Clinical stage from 10/17/2017: Stage IV (cT3, cN1, cM1b) - Signed by Truitt Merle, MD on 10/17/2017    10/24/2017 - 11/04/2017 Radiation Therapy     The Right lung mass was treated to 30 Gy in 10 fractions of 3 Gy    11/08/2017 -  Chemotherapy    -first line chemo with carboplatin, paclitaxel, Atezolizumab and bevacizumab every 3weeks starting 11/08/17.  -Switched to maintenance therapy with avastin and atezolizumab q3weeks on 03/24/18    01/10/2018 Imaging    01/10/2018 CT CA IMPRESSION: 1. Response to therapy of central right lower lobe lung mass. Re-expansion of the right middle lobe with significantly improved right lower lobe postobstructive pneumonitis. Residual geographic airspace and ground-glass opacity within the medial right lung, primarily favored to be radiation induced. 2. Near complete resolution of presternal soft tissue mass. 3. New right adrenal nodule since 10/11/2017, suspicious for metastatic disease. 4. Right-sided Port-A-Cath with probable nonocclusive thrombus along the catheter within  the right brachiocephalic vein. 5. Age advanced coronary artery atherosclerosis. Recommend assessment of  coronary risk factors and consideration of medical therapy. 6. Aortic atherosclerosis (ICD10-I70.0) and emphysema (ICD10-J43.9).    03/10/2018 Imaging    CT CAP W Contrast 03/10/18 IMPRESSION: 1. Radiation changes involving the right paramediastinal lung and right hilum but no findings suspicious for residual or recurrent tumor. 2. No evidence of pulmonary metastatic disease. Stable emphysematous changes and areas of pulmonary scarring. 3. Stable 11 mm right adrenal gland nodule. 4. No findings for metastatic disease involving the liver or bony structures.    03/24/2018 - 04/24/2018 Chemotherapy    The patient had bevacizumab (AVASTIN) 1,300 mg in sodium chloride 0.9 % 100 mL chemo infusion, 15.5 mg/kg = 1,275 mg, Intravenous,  Once, 1 of 4 cycles Administration: 1,300 mg (03/24/2018) atezolizumab (TECENTRIQ) 1,200 mg in sodium chloride 0.9 % 250 mL chemo infusion, 1,200 mg, Intravenous, Once, 1 of 4 cycles Administration: 1,200 mg (03/24/2018)  for chemotherapy treatment.     04/11/2018 Imaging    CT ANGIO CHEST PE W OR WO CONTRAST  IMPRESSION: 1. No evidence of acute pulmonary embolism. 2. Progressive radiation changes in the right perihilar region. Cavitary retro hilar mass and right paratracheal lymph node have enlarged, suspicious for local recurrence/disease progression. 3. Enlarging right adrenal nodule suspicious for metastatic disease. 4. New right pleural effusion with increased atelectasis at both lung bases. 5. These results will be called to the ordering clinician or representative by the Radiology Department at the imaging location.     04/21/2018 Imaging    NUCLEAR MEDICINE PET SKULL BASE TO THIGH IMPRESSION: 1. Marked hypermetabolism in the amorphous soft tissue involving the right hilum. This represents the central component of the apparent radiation scarring. 2. Hypermetabolic mediastinal nodal metastases with nodal metastases identified in the anterior right juxta  diaphragmatic fat. 3. Hypermetabolic right adrenal metastasis. 4. Interval progression of loculated right pleural effusion.     05/02/2018 -  Chemotherapy    Second line chemo Alimta 500mg /m2 very 3 weeks      CURRENT THERAPY  Second line alimta 500mg /m2 every 3 weeks, started 05/02/2018   INTERVAL HISTORY: Yvonne Lang is a 53 y.o. female who is here for follow-up. She was admitted to the ED on 04/28/2018 due to worsening SOB. Pt had a thoracentesis that retrieved about 200 cc fluid. Today, she is here alone. She believes that her chemotherapy is not working. She is experiencing a lot of diffuse pain around her back, SOB, and feels like a small nodule on her chest is growing. She tries to stay active, eats regularly even if she has a low appetite, and drinks plenty of fluids. She does not use the nebulizer at home because she is scared she will become dependent of it. She denies feeling sick due to chemotherapy.    Pertinent positives and negatives of review of systems are listed and detailed within the above HPI.  REVIEW OF SYSTEMS:   Constitutional: Denies fevers, chills or abnormal weight loss, (+) weight gain  Eyes: Denies blurriness of vision Ears, nose, mouth, throat, and face: Denies mucositis or sore throat Respiratory: Denies cough, dyspnea or wheezes, (+) SOB  Cardiovascular: Denies palpitation, chest discomfort or lower extremity swelling Gastrointestinal:  Denies nausea, heartburn or change in bowel habits Skin: Denies abnormal skin rashes Lymphatics: Denies new lymphadenopathy or easy bruising Neurological:Denies numbness, tingling or new weaknesses Behavioral/Psych: Mood is stable, no new changes  Musculoskeletal: (+) diffuse back pain  All other systems  were reviewed with the patient and are negative.  MEDICAL HISTORY:  Past Medical History:  Diagnosis Date  . Anxiety   . Bronchitis   . COPD (chronic obstructive pulmonary disease) ([place])   . Depression   .  GERD (gastroesophageal reflux disease)   . H/O gastric bypass    1999  . Irritable bowel syndrome   . Lung cancer [facility])     SURGICAL HISTORY: Past Surgical History:  Procedure Laterality Date  . BACK SURGERY    . CHOLECYSTECTOMY    . COLON SURGERY     for a kink in her colon and 12 inchs removed  . COLONOSCOPY N/A 12/13/2013   Procedure: COLONOSCOPY;  Surgeon: Rogene Houston, MD;  Location: [facility]O [facility];  Service: Endoscopy;  Laterality: N/A;  200  . ESOPHAGOGASTRODUODENOSCOPY N/A 12/13/2013   Procedure: ESOPHAGOGASTRODUODENOSCOPY (EGD);  Surgeon: Rogene Houston, MD;  Location: [facility] ENDO SUITE;  Service: Endoscopy;  Laterality: N/A;  . Gastric Bypas  2000  . IR IMAGING GUIDED PORT INSERTION  11/14/2017    I have reviewed the social history and family history with the patient and they are unchanged from previous note.  ALLERGIES:  is allergic to codeine.  MEDICATIONS:  Current Outpatient Medications  Medication Sig Dispense Refill  . albuterol (PROVENTIL HFA) 108 (90 Base) MCG/ACT inhaler Inhale 2 puffs into the lungs 2 (two) times daily. 1 Inhaler 2  . albuterol (PROVENTIL) 4 MG tablet Take 4 mg by mouth 2 (two) times daily.     Marland Kitchen ALPRAZolam (XANAX) 0.25 MG tablet TAKE 1 TABLET BY MOUTH AT BEDTIME AS NEEDED FOR ANXIETY (Patient taking differently: Take 0.25 mg by mouth at bedtime as needed for sleep. ) 30 tablet 0  . butalbital-acetaminophen-caffeine (FIORICET, ESGIC) 50-325-40 MG tablet Take 1-2 tablets by mouth every 6 (six) hours as needed for headache. 60 tablet 1  . CALCIUM-MAGNESIUM-ZINC PO Take 1 tablet by mouth daily.    Marland Kitchen dexamethasone (DECADRON) 4 MG tablet Take 1 tab two times a day the day before Alimta chemo. Take 2 tabs the day after chemo, then take 2 tabs two times a day for 2 days. 30 tablet 1  . dicyclomine (BENTYL) 20 MG tablet Take 1 tablet (20 mg total) by mouth every 6 (six) hours. 60 tablet 1  . DULoxetine (CYMBALTA) 60 MG capsule Take 60 mg by mouth daily.      . folic acid (FOLVITE) 1 MG tablet Take 1 tablet (1 mg total) by mouth daily. Start 5-7 days before Alimta chemotherapy. Continue until 21 days after Alimta completed. 100 tablet 3  . furosemide (LASIX) 20 MG tablet Take 1 tablet (20 mg total) by mouth daily as needed. 30 tablet 0  . gabapentin (NEURONTIN) 300 MG capsule Take 300 mg by mouth daily as needed (for pain).   4  . HYDROcodone-acetaminophen (NORCO) 10-325 MG tablet Take 2 tablets by mouth every 6 (six) hours as needed.    Marland Kitchen ipratropium-albuterol (DUONEB) 0.5-2.5 (3) MG/3ML SOLN Take 3 mLs by nebulization every 4 (four) hours as needed. (Patient taking differently: Take 3 mLs by nebulization every 4 (four) hours as needed (for shortness of breath). ) 360 mL 1  . lidocaine (XYLOCAINE) 2 % solution as directed.    . lidocaine-prilocaine (EMLA) cream Apply 1 application topically as needed. Apply to port one hour prior to port access/chemo 30 g 2  . magic mouthwash w/lidocaine SOLN Take 5 mLs by mouth 3 (three) times daily. Swish and spit 3  x daily 240 mL 0  . MOVANTIK 25 MG TABS tablet Take 1 tablet by mouth daily.    . Multiple Vitamin (MULTIVITAMIN) tablet Take 1 tablet by mouth daily.    Marland Kitchen omeprazole (PRILOSEC) 20 MG capsule Take 1 capsule (20 mg total) by mouth 2 (two) times daily before a meal. 60 capsule 0  . ondansetron (ZOFRAN) 8 MG tablet TAKE 1 TABLET BY MOUTH EVERY 8 HOURS AS NEEDED FOR NAUSEA AND VOMITING 30 tablet 0  . Oxycodone HCl 10 MG TABS Take 20 mg by mouth 3 (three) times daily.    . potassium chloride SA (K-DUR,KLOR-CON) 20 MEQ tablet Take 1 tablet (20 mEq total) by mouth daily. 60 tablet 0  . prochlorperazine (COMPAZINE) 10 MG tablet Take 1 tablet (10 mg total) by mouth every 6 (six) hours as needed for nausea or vomiting. 30 tablet 0  . rOPINIRole (REQUIP) 0.5 MG tablet Take 0.5 mg by mouth at bedtime.   11  . sucralfate (CARAFATE) 1 g tablet TAKE 1 TABLET(1 GRAM) BY MOUTH FOUR TIMES DAILY (Patient taking differently:  Take 1 g by mouth 4 (four) times daily. ) 120 tablet 0  . traMADol (ULTRAM) 50 MG tablet Take 1 tablet (50 mg total) by mouth every 6 (six) hours as needed. 90 tablet 0  . TRELEGY ELLIPTA 100-62.5-25 MCG/INH AEPB Inhale 1 puff into the lungs daily.   5  . zolpidem (AMBIEN) 10 MG tablet Take 1 tablet by mouth at bedtime.     No current facility-administered medications for this visit.     PHYSICAL EXAMINATION: ECOG PERFORMANCE STATUS: 2 - Symptomatic, <50% confined to bed  Vitals:   05/23/18 1403  BP: 125/82  Pulse: 92  Resp: 18  Temp: 97.9 F (36.6 C)  SpO2: 99%   Filed Weights   05/23/18 1403  Weight: 175 lb (79.4 kg)    GENERAL:alert, no distress and comfortable SKIN: skin color, texture, turgor are normal, no rashes or significant lesions EYES: normal, Conjunctiva are pink and non-injected, sclera clear OROPHARYNX:no exudate, no erythema and lips, buccal mucosa, and tongue normal  NECK: supple, thyroid normal size, non-tender, without nodularity LYMPH:  no palpable lymphadenopathy in the cervical, axillary or inguinal LUNGS: clear to auscultation and percussion with normal breathing effort HEART: regular rate & rhythm and no murmurs and no lower extremity edema ABDOMEN:abdomen soft, non-tender and normal bowel sounds, (+) slightly enlarged liver  Musculoskeletal:no cyanosis of digits and no clubbing  NEURO: alert & oriented x 3 with fluent speech, no focal motor/sensory deficits  LABORATORY DATA:  I have reviewed the data as listed CBC Latest Ref Rng & Units 05/23/2018 05/09/2018 05/02/2018  WBC 4.0 - 10.5 K/uL 11.6(H) 7.2 10.6(H)  Hemoglobin 12.0 - 15.0 g/dL 10.3(L) 10.6(L) 9.8(L)  Hematocrit 36.0 - 46.0 % 33.3(L) 33.7(L) 31.3(L)  Platelets 150 - 400 K/uL 527(H) 343 528(H)     CMP Latest Ref Rng & Units 05/23/2018 05/09/2018 05/02/2018  Glucose 70 - 99 mg/dL 128(H) 96 98  BUN 6 - 20 mg/dL 6 8 8   Creatinine 0.44 - 1.00 mg/dL 0.61 0.61 0.59  Sodium 135 - 145 mmol/L 137  132(L) 140  Potassium 3.5 - 5.1 mmol/L 3.9 4.3 3.5  Chloride 98 - 111 mmol/L 101 99 102  CO2 22 - 32 mmol/L 27 26 29   Calcium 8.9 - 10.3 mg/dL 8.4(L) 8.4(L) 8.4(L)  Total Protein 6.5 - 8.1 g/dL 7.0 6.8 6.7  Total Bilirubin 0.3 - 1.2 mg/dL 0.3 0.3 <0.2(L)  Alkaline  Phos 38 - 126 U/L 576(H) 448(H) 531(H)  AST 15 - 41 U/L 37 40 25  ALT 0 - 44 U/L 43 90(H) 36      RADIOGRAPHIC STUDIES: I have personally reviewed the radiological images as listed and agreed with the findings in the report. No results found.   ASSESSMENT & PLAN:  Yvonne Lang is a 53 y.o. female with history of  1. 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 -Diagnosed in 09/2017. Treated with chemo and radiation. She was initially onfirst linecarboplatin, paclitaxel, Atezolizumab and bevacizumab every 3weekss/p 6 cycles, with good partial response.Switched to maintenance therapy withavastin and atezolizumabq3weeksstartedon 03/24/18.  -due to disease progression, chemo switched to second line alimta 500mg /m2 every 3 weeks on 05/02/2018, she tolerated first cycle well  -The goal of therapy is palliative, to prolong her life, and improve her quality of life -She is on folic acid 1 mg  - She is concerned chemotherapy is not working, but her exam today was unremarkable, she is clinically stable, no clinical concern for progression. I suggested we could do restaging scans earlier (after 3 cycles) to ease her concerns if needed. -Lab reviewed, adequate for treatment, will proceed to cycle 2 Alimta today -Follow-up in 3 weeks   2. Pleuriticrightchest pain, SOB -located on the right lower mid axillary area, whichis sharp and worsened with coughing and deep breathes.It started around 04/09/18. - Probably related to her lung cancer -she was admitted to hospital on 04/28/18 for dyspnea and chest pain -improved after she started second line chemo  3. Frontal Headaches -f/u with  Dr. Mickeal Skinner -overall stable   4. Anxiety and Depression -Currently on Xanax and Cymbalta.   5. Chronic back pain -Currently on Tramadol 50 mg q6Hr.  6. Low appetite, Nausea -Currently on Mirtazapine, omeprazole, ondansetron,prochlorperazine, and sucralfate.   7. Hypokalemia  -On  KCL 58meq daily   8.Goal of care discussion  -The patient understands the goal of care is palliative. -she is full code now  Plan - Labs reviewed, adequate to proceed cycle 2 chemo Alimta today  -f/u in 3 weeks before cycle 3    No problem-specific Assessment & Plan notes found for this encounter.   No orders of the defined types were placed in this encounter.  All questions were answered. The patient knows to call the clinic with any problems, questions or concerns. No barriers to learning was detected. I spent 20 minutes counseling the patient face to face. The total time spent in the appointment was 25 minutes and more than 50% was on counseling and review of test results  I, Manson Allan am acting as scribe for Dr. Truitt Merle.  I have reviewed the above documentation for accuracy and completeness, and I agree with the above.     Truitt Merle, MD 05/23/2018

## 2018-05-20 ENCOUNTER — Other Ambulatory Visit: Payer: Self-pay | Admitting: Hematology

## 2018-05-23 ENCOUNTER — Inpatient Hospital Stay (HOSPITAL_BASED_OUTPATIENT_CLINIC_OR_DEPARTMENT_OTHER): Payer: 59 | Admitting: Hematology

## 2018-05-23 ENCOUNTER — Inpatient Hospital Stay: Payer: 59

## 2018-05-23 ENCOUNTER — Inpatient Hospital Stay: Payer: 59 | Attending: Hematology

## 2018-05-23 ENCOUNTER — Telehealth: Payer: Self-pay | Admitting: Hematology

## 2018-05-23 ENCOUNTER — Encounter: Payer: Self-pay | Admitting: Hematology

## 2018-05-23 VITALS — BP 125/82 | HR 92 | Temp 97.9°F | Resp 18 | Ht 67.0 in | Wt 175.0 lb

## 2018-05-23 DIAGNOSIS — Z9221 Personal history of antineoplastic chemotherapy: Secondary | ICD-10-CM | POA: Diagnosis not present

## 2018-05-23 DIAGNOSIS — R740 Nonspecific elevation of levels of transaminase and lactic acid dehydrogenase [LDH]: Secondary | ICD-10-CM | POA: Insufficient documentation

## 2018-05-23 DIAGNOSIS — C3431 Malignant neoplasm of lower lobe, right bronchus or lung: Secondary | ICD-10-CM

## 2018-05-23 DIAGNOSIS — Z79899 Other long term (current) drug therapy: Secondary | ICD-10-CM | POA: Diagnosis not present

## 2018-05-23 DIAGNOSIS — Z923 Personal history of irradiation: Secondary | ICD-10-CM | POA: Diagnosis not present

## 2018-05-23 DIAGNOSIS — F329 Major depressive disorder, single episode, unspecified: Secondary | ICD-10-CM | POA: Diagnosis not present

## 2018-05-23 DIAGNOSIS — G8929 Other chronic pain: Secondary | ICD-10-CM | POA: Diagnosis not present

## 2018-05-23 DIAGNOSIS — E876 Hypokalemia: Secondary | ICD-10-CM | POA: Insufficient documentation

## 2018-05-23 DIAGNOSIS — K219 Gastro-esophageal reflux disease without esophagitis: Secondary | ICD-10-CM | POA: Diagnosis not present

## 2018-05-23 DIAGNOSIS — F419 Anxiety disorder, unspecified: Secondary | ICD-10-CM

## 2018-05-23 DIAGNOSIS — R51 Headache: Secondary | ICD-10-CM

## 2018-05-23 DIAGNOSIS — Z5111 Encounter for antineoplastic chemotherapy: Secondary | ICD-10-CM | POA: Insufficient documentation

## 2018-05-23 DIAGNOSIS — R0781 Pleurodynia: Secondary | ICD-10-CM | POA: Insufficient documentation

## 2018-05-23 DIAGNOSIS — C3490 Malignant neoplasm of unspecified part of unspecified bronchus or lung: Secondary | ICD-10-CM

## 2018-05-23 DIAGNOSIS — C7971 Secondary malignant neoplasm of right adrenal gland: Secondary | ICD-10-CM | POA: Insufficient documentation

## 2018-05-23 DIAGNOSIS — R110 Nausea: Secondary | ICD-10-CM | POA: Diagnosis not present

## 2018-05-23 DIAGNOSIS — M549 Dorsalgia, unspecified: Secondary | ICD-10-CM | POA: Insufficient documentation

## 2018-05-23 DIAGNOSIS — Z95828 Presence of other vascular implants and grafts: Secondary | ICD-10-CM

## 2018-05-23 DIAGNOSIS — D630 Anemia in neoplastic disease: Secondary | ICD-10-CM

## 2018-05-23 LAB — CBC WITH DIFFERENTIAL (CANCER CENTER ONLY)
Abs Immature Granulocytes: 0.05 10*3/uL (ref 0.00–0.07)
Basophils Absolute: 0 10*3/uL (ref 0.0–0.1)
Basophils Relative: 0 %
EOS PCT: 0 %
Eosinophils Absolute: 0 10*3/uL (ref 0.0–0.5)
HCT: 33.3 % — ABNORMAL LOW (ref 36.0–46.0)
Hemoglobin: 10.3 g/dL — ABNORMAL LOW (ref 12.0–15.0)
Immature Granulocytes: 0 %
Lymphocytes Relative: 6 %
Lymphs Abs: 0.6 10*3/uL — ABNORMAL LOW (ref 0.7–4.0)
MCH: 29.4 pg (ref 26.0–34.0)
MCHC: 30.9 g/dL (ref 30.0–36.0)
MCV: 95.1 fL (ref 80.0–100.0)
Monocytes Absolute: 0.5 10*3/uL (ref 0.1–1.0)
Monocytes Relative: 4 %
Neutro Abs: 10.4 10*3/uL — ABNORMAL HIGH (ref 1.7–7.7)
Neutrophils Relative %: 90 %
Platelet Count: 527 10*3/uL — ABNORMAL HIGH (ref 150–400)
RBC: 3.5 MIL/uL — ABNORMAL LOW (ref 3.87–5.11)
RDW: 13.9 % (ref 11.5–15.5)
WBC Count: 11.6 10*3/uL — ABNORMAL HIGH (ref 4.0–10.5)
nRBC: 0 % (ref 0.0–0.2)

## 2018-05-23 LAB — CMP (CANCER CENTER ONLY)
ALK PHOS: 576 U/L — AB (ref 38–126)
ALT: 43 U/L (ref 0–44)
AST: 37 U/L (ref 15–41)
Albumin: 2.5 g/dL — ABNORMAL LOW (ref 3.5–5.0)
Anion gap: 9 (ref 5–15)
BUN: 6 mg/dL (ref 6–20)
CO2: 27 mmol/L (ref 22–32)
CREATININE: 0.61 mg/dL (ref 0.44–1.00)
Calcium: 8.4 mg/dL — ABNORMAL LOW (ref 8.9–10.3)
Chloride: 101 mmol/L (ref 98–111)
GFR, Est AFR Am: >60 mL/min (ref >60)
GFR, Estimated: >60 mL/min (ref >60)
Glucose, Bld: 128 mg/dL — ABNORMAL HIGH (ref 70–99)
Potassium: 3.9 mmol/L (ref 3.5–5.1)
Sodium: 137 mmol/L (ref 135–145)
Total Bilirubin: 0.3 mg/dL (ref 0.3–1.2)
Total Protein: 7 g/dL (ref 6.5–8.1)

## 2018-05-23 LAB — FERRITIN: Ferritin: 244 ng/mL (ref 11–307)

## 2018-05-23 MED ORDER — PROCHLORPERAZINE MALEATE 10 MG PO TABS
ORAL_TABLET | ORAL | Status: AC
  Filled 2018-05-23: qty 1

## 2018-05-23 MED ORDER — SODIUM CHLORIDE 0.9% FLUSH
10.0000 mL | INTRAVENOUS | Status: DC | PRN
  Administered 2018-05-23: 10 mL
  Filled 2018-05-23: qty 10

## 2018-05-23 MED ORDER — SODIUM CHLORIDE 0.9 % IV SOLN
Freq: Once | INTRAVENOUS | Status: AC
  Administered 2018-05-23: 15:00:00 via INTRAVENOUS
  Filled 2018-05-23: qty 250

## 2018-05-23 MED ORDER — PROCHLORPERAZINE MALEATE 10 MG PO TABS
10.0000 mg | ORAL_TABLET | Freq: Once | ORAL | Status: AC
  Administered 2018-05-23: 10 mg via ORAL

## 2018-05-23 MED ORDER — SODIUM CHLORIDE 0.9 % IV SOLN
500.0000 mg/m2 | Freq: Once | INTRAVENOUS | Status: AC
  Administered 2018-05-23: 1000 mg via INTRAVENOUS
  Filled 2018-05-23: qty 40

## 2018-05-23 MED ORDER — HEPARIN SOD (PORK) LOCK FLUSH 100 UNIT/ML IV SOLN
500.0000 [IU] | Freq: Once | INTRAVENOUS | Status: AC | PRN
  Administered 2018-05-23: 500 [IU]
  Filled 2018-05-23: qty 5

## 2018-05-23 NOTE — Patient Instructions (Signed)
[facility] Discharge Instructions for Patients Receiving Chemotherapy  Today you received the following chemotherapy agents: Alimta  To help prevent nausea and vomiting after your treatment, we encourage you to take your nausea medication as directed.   If you develop nausea and vomiting that is not controlled by your nausea medication, call the clinic.   BELOW ARE SYMPTOMS THAT SHOULD BE REPORTED IMMEDIATELY:  *FEVER GREATER THAN 100.5 F  *CHILLS WITH OR WITHOUT FEVER  NAUSEA AND VOMITING THAT IS NOT CONTROLLED WITH YOUR NAUSEA MEDICATION  *UNUSUAL SHORTNESS OF BREATH  *UNUSUAL BRUISING OR BLEEDING  TENDERNESS IN MOUTH AND THROAT WITH OR WITHOUT PRESENCE OF ULCERS  *URINARY PROBLEMS  *BOWEL PROBLEMS  UNUSUAL RASH Items with * indicate a potential emergency and should be followed up as soon as possible.  Feel free to call the clinic should you have any questions or concerns. The clinic phone number is [phone].  Please show the [facility] at check-in to the Emergency Department and triage nurse.  Pemetrexed injection What is this medicine? PEMETREXED (PEM e TREX ed) is a chemotherapy drug used to treat lung cancers like non-small cell lung cancer and mesothelioma. It may also be used to treat other cancers. This medicine may be used for other purposes; ask your health care provider or pharmacist if you have questions. COMMON BRAND NAME(S): Alimta What should I tell my health care provider before I take this medicine? They need to know if you have any of these conditions: -infection (especially a virus infection such as chickenpox, cold sores, or herpes) -kidney disease -low blood counts, like low white cell, platelet, or red cell counts -lung or breathing disease, like asthma -radiation therapy -an unusual or allergic reaction to pemetrexed, other medicines, foods, dyes, or preservative -pregnant or trying to get pregnant -breast-feeding How  should I use this medicine? This drug is given as an infusion into a vein. It is administered in a hospital or clinic by a specially trained health care professional. Talk to your pediatrician regarding the use of this medicine in children. Special care may be needed. Overdosage: If you think you have taken too much of this medicine contact a poison control center or emergency room at once. NOTE: This medicine is only for you. Do not share this medicine with others. What if I miss a dose? It is important not to miss your dose. Call your doctor or health care professional if you are unable to keep an appointment. What may interact with this medicine? This medicine may interact with the following medications: -Ibuprofen This list may not describe all possible interactions. Give your health care provider a list of all the medicines, herbs, non-prescription drugs, or dietary supplements you use. Also tell them if you smoke, drink alcohol, or use illegal drugs. Some items may interact with your medicine. What should I watch for while using this medicine? Visit your doctor for checks on your progress. This drug may make you feel generally unwell. This is not uncommon, as chemotherapy can affect healthy cells as well as cancer cells. Report any side effects. Continue your course of treatment even though you feel ill unless your doctor tells you to stop. In some cases, you may be given additional medicines to help with side effects. Follow all directions for their use. Call your doctor or health care professional for advice if you get a fever, chills or sore throat, or other symptoms of a cold or flu. Do not treat yourself.  This drug decreases your body's ability to fight infections. Try to avoid being around people who are sick. This medicine may increase your risk to bruise or bleed. Call your doctor or health care professional if you notice any unusual bleeding. Be careful brushing and flossing your teeth  or using a toothpick because you may get an infection or bleed more easily. If you have any dental work done, tell your dentist you are receiving this medicine. Avoid taking products that contain aspirin, acetaminophen, ibuprofen, naproxen, or ketoprofen unless instructed by your doctor. These medicines may hide a fever. Call your doctor or health care professional if you get diarrhea or mouth sores. Do not treat yourself. To protect your kidneys, drink water or other fluids as directed while you are taking this medicine. Do not become pregnant while taking this medicine or for 6 months after stopping it. Women should inform their doctor if they wish to become pregnant or think they might be pregnant. Men should not father a child while taking this medicine and for 3 months after stopping it. This may interfere with the ability to father a child. You should talk to your doctor or health care professional if you are concerned about your fertility. There is a potential for serious side effects to an unborn child. Talk to your health care professional or pharmacist for more information. Do not breast-feed an infant while taking this medicine or for 1 week after stopping it. What side effects may I notice from receiving this medicine? Side effects that you should report to your doctor or health care professional as soon as possible: -allergic reactions like skin rash, itching or hives, swelling of the face, lips, or tongue -breathing problems -redness, blistering, peeling or loosening of the skin, including inside the mouth -signs and symptoms of bleeding such as bloody or black, tarry stools; red or dark-brown urine; spitting up blood or brown material that looks like coffee grounds; red spots on the skin; unusual bruising or bleeding from the eye, gums, or nose -signs and symptoms of infection like fever or chills; cough; sore throat; pain or trouble passing urine -signs and symptoms of kidney injury like  trouble passing urine or change in the amount of urine -signs and symptoms of liver injury like dark yellow or brown urine; general ill feeling or flu-like symptoms; light-colored stools; loss of appetite; nausea; right upper belly pain; unusually weak or tired; yellowing of the eyes or skin Side effects that usually do not require medical attention (report to your doctor or health care professional if they continue or are bothersome): -constipation -mouth sores -nausea, vomiting -unusually weak or tired This list may not describe all possible side effects. Call your doctor for medical advice about side effects. You may report side effects to FDA at 1-800-FDA-1088. Where should I keep my medicine? This drug is given in a hospital or clinic and will not be stored at home. NOTE: This sheet is a summary. It may not cover all possible information. If you have questions about this medicine, talk to your doctor, pharmacist, or health care provider.  2019 [employer]/Gold Standard (2017-04-27 16:11:33)

## 2018-05-23 NOTE — Telephone Encounter (Signed)
Gave patient avs report and appointments for March and April.

## 2018-06-01 ENCOUNTER — Other Ambulatory Visit: Payer: Self-pay | Admitting: Hematology

## 2018-06-05 ENCOUNTER — Encounter: Payer: Self-pay | Admitting: Hematology

## 2018-06-07 ENCOUNTER — Encounter: Payer: Self-pay | Admitting: Hematology

## 2018-06-08 ENCOUNTER — Encounter: Payer: Self-pay | Admitting: Hematology

## 2018-06-09 NOTE — Progress Notes (Signed)
[place]   [phone] [phone]   Clinic Follow up Note   Patient Care Team: Redmond School, MD as PCP - General (Internal Medicine)  Date of Service:  06/12/2018  CHIEF COMPLAINT: Metastatic non-small cell lung cancer  SUMMARY OF ONCOLOGIC HISTORY: Oncology History   Cancer Staging Metastatic non-small cell lung cancer [facility]) Staging form: Lung, AJCC 8th Edition - Clinical stage from 10/17/2017: Stage IV (cT3, cN1, cM1b) - Signed by Truitt Merle, MD on 10/17/2017       Metastatic non-small cell lung cancer ([place])   09/20/2017 Imaging    US Breast Left 09/20/17  IMPRESSION Indeterminte palpable mass measuring 3.2x1.7x2.8 cm  inferior medial to the inframammary fold of the left breast.      09/20/2017 Initial Biopsy    Diagnosis 09/20/17 Soft Tissue Needle Core Biopsy, inferior medial to IMF - ADENOCARCINOMA. - SEE MICROSCOPIC DESCRIPTION.  Microscopic Comment Immunohistochemistry will be performed and reported as an addendum. (JDP:ah 09/23/17) ADDENDUM: Immunohistochemistry shows the tumor is strongly positive with cytokeratin AE1/AE3, cytokeratin 7 and shows moderate weak positivity with CDX-2. The tumor is negative with estrogen receptor, progesterone receptor, GCDFP, GATA-3, Napsin A, TTF-1, WT-1 and cytokeratin 20. The immunophenotype is consistent with metastatic carcinoma. The combination of CDX-2 and cytokeratin 7 positivity raises the possibility of an upper gastrointestinal primary. Clinical correlation is essential.    10/06/2017 Initial Diagnosis    Metastatic adenocarcinoma involving soft tissue with unknown primary site [employer] [facility])    10/10/2017 Procedure    Upper Endoscopy by Dr. Silverio Decamp 10/10/17  IMPRESSION - Normal esophagus. - Z-line regular, 35 cm from the incisors. - Gastric bypass with a normal-sized pouch and intact staple line. Gastrojejunal anastomosis characterized by healthy appearing mucosa. - No specimens collected.    10/12/2017 Imaging    CT CAP WO Contrast 10/12/17  IMPRESSION: 7 cm central right lung mass involving the right hilum, with postobstructive collapse of the right middle lobe, highly suspicious for primary bronchogenic carcinoma. 5 cm masslike opacity in the superior segment of the right lower lobe may represent carcinoma or postobstructive pneumonitis. 3.3 cm soft tissue mass in the lower anterior chest wall soft tissues, suspicious for metastatic disease. No evidence of abdominal or pelvic metastatic disease.     10/14/2017 Imaging    MRI Brain 10/14/17  IMPRESSION: 1. No metastatic disease or acute intracranial abnormality. Normal MRI appearance of the brain. 2. 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      10/17/2017 Cancer Staging    Staging form: Lung, AJCC 8th Edition - Clinical stage from 10/17/2017: Stage IV (cT3, cN1, cM1b) - Signed by Truitt Merle, MD on 10/17/2017    10/24/2017 - 11/04/2017 Radiation Therapy     The Right lung mass was treated to 30 Gy in 10 fractions of 3 Gy    11/08/2017 -  Chemotherapy    -first line chemo with carboplatin, paclitaxel, Atezolizumab and bevacizumab every 3weeks starting 11/08/17.  -Switched to maintenance therapy with avastin and atezolizumab q3weeks on 03/24/18    01/10/2018 Imaging    01/10/2018 CT CA IMPRESSION: 1. Response to therapy of central right lower lobe lung mass. Re-expansion of the right middle lobe with significantly improved right lower lobe postobstructive pneumonitis. Residual geographic airspace and ground-glass opacity within the medial right lung, primarily favored to be radiation induced. 2. Near complete resolution of presternal soft tissue mass. 3. New right adrenal nodule since 10/11/2017, suspicious for metastatic disease. 4. Right-sided Port-A-Cath with probable nonocclusive thrombus along  the catheter within the right brachiocephalic vein. 5. Age advanced coronary artery atherosclerosis. Recommend  assessment of coronary risk factors and consideration of medical therapy. 6. Aortic atherosclerosis (ICD10-I70.0) and emphysema (ICD10-J43.9).    03/10/2018 Imaging    CT CAP W Contrast 03/10/18 IMPRESSION: 1. Radiation changes involving the right paramediastinal lung and right hilum but no findings suspicious for residual or recurrent tumor. 2. No evidence of pulmonary metastatic disease. Stable emphysematous changes and areas of pulmonary scarring. 3. Stable 11 mm right adrenal gland nodule. 4. No findings for metastatic disease involving the liver or bony structures.    03/24/2018 - 04/24/2018 Chemotherapy    The patient had bevacizumab (AVASTIN) 1,300 mg in sodium chloride 0.9 % 100 mL chemo infusion, 15.5 mg/kg = 1,275 mg, Intravenous,  Once, 1 of 4 cycles Administration: 1,300 mg (03/24/2018) atezolizumab (TECENTRIQ) 1,200 mg in sodium chloride 0.9 % 250 mL chemo infusion, 1,200 mg, Intravenous, Once, 1 of 4 cycles Administration: 1,200 mg (03/24/2018)  for chemotherapy treatment.     04/11/2018 Imaging    CT ANGIO CHEST PE W OR WO CONTRAST  IMPRESSION: 1. No evidence of acute pulmonary embolism. 2. Progressive radiation changes in the right perihilar region. Cavitary retro hilar mass and right paratracheal lymph node have enlarged, suspicious for local recurrence/disease progression. 3. Enlarging right adrenal nodule suspicious for metastatic disease. 4. New right pleural effusion with increased atelectasis at both lung bases. 5. These results will be called to the ordering clinician or representative by the Radiology Department at the imaging location.     04/21/2018 Imaging    NUCLEAR MEDICINE PET SKULL BASE TO THIGH IMPRESSION: 1. Marked hypermetabolism in the amorphous soft tissue involving the right hilum. This represents the central component of the apparent radiation scarring. 2. Hypermetabolic mediastinal nodal metastases with nodal metastases identified in the  anterior right juxta diaphragmatic fat. 3. Hypermetabolic right adrenal metastasis. 4. Interval progression of loculated right pleural effusion.     05/02/2018 -  Chemotherapy    Second line chemo Alimta 565m/m2 very 3 weeks       CURRENT THERAPY:  Second line Alimta 5095mm2 every 3 weeks, started 05/02/2018  INTERVAL HISTORY:  DeTieasha Larsens here for a follow up and treatment. She presents to the clinic today by herself. She notes she is doing well. She notes she has mainly been at home and taking precautions. She notes her breathing is adequate. She has some wheezing occasionally. She notes she has not needed to use her inhaler. She expresses concern with leaving the house given COVID-19.  She notes she will no longer be under [employer] the end of this month and plans to start with [employer] notes she was denied of Medicaid. She feels she met her deductible with [employer] this year. She denied any rashes and has been taking Folic Acid. I reviewed her medication list with her.    REVIEW OF SYSTEMS:   Constitutional: Denies fevers, chills or abnormal weight loss Eyes: Denies blurriness of vision Ears, nose, mouth, throat, and face: Denies mucositis or sore throat Respiratory: Denies cough, dyspnea (+) occasional wheezes Cardiovascular: Denies palpitation, chest discomfort or lower extremity swelling Gastrointestinal:  Denies nausea, heartburn or change in bowel habits Skin: Denies abnormal skin rashes Lymphatics: Denies new lymphadenopathy or easy bruising Neurological:Denies numbness, tingling or new weaknesses Behavioral/Psych: Mood is stable, no new changes  All other systems were reviewed with the patient and are negative.  MEDICAL HISTORY:  Past  Medical History:  Diagnosis Date  . Anxiety   . Bronchitis   . COPD (chronic obstructive pulmonary disease) ([place])   . Depression   . GERD (gastroesophageal reflux disease)   . H/O gastric bypass    1999   . Irritable bowel syndrome   . Lung cancer [facility])     SURGICAL HISTORY: Past Surgical History:  Procedure Laterality Date  . BACK SURGERY    . CHOLECYSTECTOMY    . COLON SURGERY     for a kink in her colon and 12 inchs removed  . COLONOSCOPY N/A 12/13/2013   Procedure: COLONOSCOPY;  Surgeon: Rogene Houston, MD;  Location: [facility];  Service: Endoscopy;  Laterality: N/A;  200  . ESOPHAGOGASTRODUODENOSCOPY N/A 12/13/2013   Procedure: ESOPHAGOGASTRODUODENOSCOPY (EGD);  Surgeon: Rogene Houston, MD;  Location: [facility];  Service: Endoscopy;  Laterality: N/A;  . Gastric Bypas  2000  . IR IMAGING GUIDED PORT INSERTION  11/14/2017    I have reviewed the social history and family history with the patient and they are unchanged from previous note.  ALLERGIES:  is allergic to codeine.  MEDICATIONS:  Current Outpatient Medications  Medication Sig Dispense Refill  . albuterol (PROVENTIL HFA) 108 (90 Base) MCG/ACT inhaler Inhale 2 puffs into the lungs 2 (two) times daily. 1 Inhaler 2  . albuterol (PROVENTIL) 4 MG tablet Take 4 mg by mouth 2 (two) times daily.     Marland Kitchen ALPRAZolam (XANAX) 0.25 MG tablet TAKE 1 TABLET BY MOUTH AT BEDTIME AS NEEDED FOR ANXIETY (Patient taking differently: Take 0.25 mg by mouth at bedtime as needed for sleep. ) 30 tablet 0  . butalbital-acetaminophen-caffeine (FIORICET, ESGIC) 50-325-40 MG tablet Take 1-2 tablets by mouth every 6 (six) hours as needed for headache. 60 tablet 1  . CALCIUM-MAGNESIUM-ZINC PO Take 1 tablet by mouth daily.    Marland Kitchen dexamethasone (DECADRON) 4 MG tablet Take 1 tab two times a day the day before Alimta chemo. Take 2 tabs the day after chemo, then take 2 tabs two times a day for 2 days. 30 tablet 1  . dicyclomine (BENTYL) 20 MG tablet Take 1 tablet (20 mg total) by mouth every 6 (six) hours. 60 tablet 1  . DULoxetine (CYMBALTA) 60 MG capsule Take 60 mg by mouth daily.    . folic acid (FOLVITE) 1 MG tablet Take 1 tablet (1 mg total) by mouth  daily. Start 5-7 days before Alimta chemotherapy. Continue until 21 days after Alimta completed. 100 tablet 3  . furosemide (LASIX) 20 MG tablet Take 1 tablet (20 mg total) by mouth daily as needed. 30 tablet 0  . gabapentin (NEURONTIN) 300 MG capsule Take 300 mg by mouth daily as needed (for pain).   4  . HYDROcodone-acetaminophen (NORCO) 10-325 MG tablet Take 2 tablets by mouth every 6 (six) hours as needed.    Marland Kitchen ipratropium-albuterol (DUONEB) 0.5-2.5 (3) MG/3ML SOLN Take 3 mLs by nebulization every 4 (four) hours as needed. (Patient taking differently: Take 3 mLs by nebulization every 4 (four) hours as needed (for shortness of breath). ) 360 mL 1  . lidocaine (XYLOCAINE) 2 % solution as directed.    . lidocaine-prilocaine (EMLA) cream Apply 1 application topically as needed. Apply to port one hour prior to port access/chemo 30 g 2  . magic mouthwash w/lidocaine SOLN Take 5 mLs by mouth 3 (three) times daily. Swish and spit 3 x daily 240 mL 0  . MOVANTIK 25 MG TABS tablet Take 1  tablet by mouth daily.    . Multiple Vitamin (MULTIVITAMIN) tablet Take 1 tablet by mouth daily.    Marland Kitchen omeprazole (PRILOSEC) 20 MG capsule Take 1 capsule (20 mg total) by mouth 2 (two) times daily before a meal. 60 capsule 0  . ondansetron (ZOFRAN) 8 MG tablet Take 1 tablet (8 mg total) by mouth every 8 (eight) hours as needed for nausea or vomiting. 30 tablet 2  . Oxycodone HCl 10 MG TABS Take 20 mg by mouth 3 (three) times daily.    . potassium chloride SA (K-DUR,KLOR-CON) 20 MEQ tablet Take 1 tablet (20 mEq total) by mouth daily. 60 tablet 0  . prochlorperazine (COMPAZINE) 10 MG tablet Take 1 tablet (10 mg total) by mouth every 6 (six) hours as needed for nausea or vomiting. 30 tablet 0  . rOPINIRole (REQUIP) 0.5 MG tablet Take 0.5 mg by mouth at bedtime.   11  . sucralfate (CARAFATE) 1 g tablet TAKE 1 TABLET(1 GRAM) BY MOUTH FOUR TIMES DAILY 120 tablet 0  . traMADol (ULTRAM) 50 MG tablet Take 1 tablet (50 mg total) by  mouth every 6 (six) hours as needed. 90 tablet 0  . TRELEGY ELLIPTA 100-62.5-25 MCG/INH AEPB Inhale 1 puff into the lungs daily.   5  . zolpidem (AMBIEN) 10 MG tablet Take 1 tablet by mouth at bedtime.     No current facility-administered medications for this visit.     PHYSICAL EXAMINATION: ECOG PERFORMANCE STATUS: 1 - Symptomatic but completely ambulatory  Vitals:   06/12/18 1244  BP: (!) 130/92  Pulse: (!) 112  Resp: 17  Temp: 97.8 F (36.6 C)  SpO2: 96%   Filed Weights   06/12/18 1244  Weight: 171 lb 3.2 oz (77.7 kg)   GENERAL:alert, no distress and comfortable SKIN: skin color, texture, turgor are normal, no rashes or significant lesions EYES: normal, Conjunctiva are pink and non-injected, sclera clear OROPHARYNX:no exudate, no erythema and lips, buccal mucosa, and tongue normal  NECK: supple, thyroid normal size, non-tender, without nodularity LYMPH:  no palpable lymphadenopathy in the cervical, axillary or inguinal LUNGS: clear percussion with normal breathing effort (+) B/l scattered rhonchi, with no wheezing HEART: regular rate & rhythm and no murmurs and no lower extremity edema ABDOMEN:abdomen soft, non-tender and normal bowel sounds Musculoskeletal:no cyanosis of digits and no clubbing  NEURO: alert & oriented x 3 with fluent speech, no focal motor/sensory deficits  LABORATORY DATA:  I have reviewed the data as listed CBC Latest Ref Rng & Units 06/12/2018 05/23/2018 05/09/2018  WBC 4.0 - 10.5 K/uL 11.9(H) 11.6(H) 7.2  Hemoglobin 12.0 - 15.0 g/dL 10.8(L) 10.3(L) 10.6(L)  Hematocrit 36.0 - 46.0 % 35.1(L) 33.3(L) 33.7(L)  Platelets 150 - 400 K/uL 529(H) 527(H) 343     CMP Latest Ref Rng & Units 06/12/2018 05/23/2018 05/09/2018  Glucose 70 - 99 mg/dL 102(H) 128(H) 96  BUN 6 - 20 mg/dL _0 Creatinine 0.44 - 1.00 mg/dL 0.58 0.61 0.61  Sodium 135 - 145 mmol/L 136 137 132(L)  Potassium 3.5 - 5.1 mmol/L 5.2(H) 3.9 4.3  Chloride 98 - 111 mmol/L 101 101 99  CO2 22 - 32  mmol/L _1 Calcium 8.9 - 10.3 mg/dL 8.3(L) 8.4(L) 8.4(L)  Total Protein 6.5 - 8.1 g/dL 7.0 7.0 6.8  Total Bilirubin 0.3 - 1.2 mg/dL 0.3 0.3 0.3  Alkaline Phos 38 - 126 U/L 623(H) 576(H) 448(H)  AST 15 - 41 U/L 73(H) 37 40  ALT 0 - 44  U/L 55(H) 43 90(H)      RADIOGRAPHIC STUDIES: I have personally reviewed the radiological images as listed and agreed with the findings in the report. No results found.   ASSESSMENT & PLAN:  Yvonne Lang is a 53 y.o. female with   1. 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 -Diagnosed in 09/2017. Treated with chemo and radiation. She was initially onfirst linecarboplatin, paclitaxel, Atezolizumab and bevacizumab every 3weekss/p 6 cycles, with good partial response.Switched tomaintenance therapy withavastin and atezolizumabq3weeksstartedon 03/24/18.  -Due to disease progression, chemo switched to second line alimta 549m/m2 every 3 weeks on 05/02/2018, she has been tolerating well  -She is on folic acid 1 mg, tolerating well. Will continue, continue B12 injection every 3 months   -Labs reviewed, CBC shows WBC 11.9, Hg 10.8, PLT 529K, ANC 10.8, CMP showed a mild hyperkalemia, stable elevated transaminitis, no other concerns. Overall adequate to proceed with Alimta today  -Will proceed with CT CAP in a week to monitor chemo response.  -F/u in 3 weeks before cycle 4  2. Pleuriticrightchest pain, SOB -located on the right lower mid axillary area, whichis sharp and worsened with coughing and deep breathes.It startedaround1/19/20. -Probably related to her lung cancer -She was admitted to hospital on 04/28/18 for dyspnea and chest pain -Her breathing is better, adequate with occasionally wheezes.  -Physical exam today showed, B/l scattered rhonchi, with no wheezing (06/12/18), otherwise adequate.   3. Frontal Headaches -f/u with Dr. VMickeal Skinner-overall stable  4. Anxiety and Depression   -Currently on Xanax and Cymbalta. Stable   5. Chronic back pain  -Currently on Tramadol 50 mg q6Hr. Stable   6. Low appetite, Nausea -Currently on Mirtazapine, omeprazole, ondansetron,prochlorperazine, and sucralfate.  -She has lost 2 pounds. I encouraged her to increase her calorie intake.   7. Hypokalemia  -On  KCL 429m daily  -K 5.2 today, will stop potassium supplement  8.  Transaminitis -Probably related to chemotherapy, will continue monitoring.  9.Goal of care discussion  -The patient understands the goal of care is palliative. -she is full code now  Plan -Labs reviewed, adequate to proceed cycle 3 chemo Alimta today  -Stop oral potassium supplement -[facility], f/u and Alimta in 3 weeks    No problem-specific Assessment & Plan notes found for this encounter.   Orders Placed This Encounter  Procedures  . CT Abdomen Pelvis W Contrast    Standing Status:   Future    Standing Expiration Date:   06/12/2019    Order Specific Question:   If indicated for the ordered procedure, I authorize the administration of contrast media per Radiology protocol    Answer:   Yes    Order Specific Question:   Is patient pregnant?    Answer:   No    Order Specific Question:   [facility]    [facility]  Order Specific Question:   Is Oral Contrast requested for this exam?    Answer:   Yes, Per Radiology protocol    Order Specific Question:   Radiology Contrast Protocol - do NOT remove file path    Answer:   _0 [email or web address]  . CT Chest W Contrast    Standing Status:   Future    Standing Expiration Date:   06/12/2019    Order Specific Question:   If indicated for the ordered procedure, I authorize the administration of contrast media per  Radiology protocol    Answer:   Yes    Order Specific Question:   Is patient pregnant?    Answer:   No    Order Specific Question:    Preferred imaging location?    Answer:   [facility]    Order Specific Question:   Radiology Contrast Protocol - do NOT remove file path    Answer:   _0 [email or web address]   All questions were answered. The patient knows to call the clinic with any problems, questions or concerns. No barriers to learning was detected. I spent 20 minutes counseling the patient face to face. The total time spent in the appointment was 25 minutes and more than 50% was on counseling and review of test results     Truitt Merle, MD 06/12/2018   I, Joslyn Devon, am acting as scribe for Truitt Merle, MD.   I have reviewed the above documentation for accuracy and completeness, and I agree with the above.

## 2018-06-12 ENCOUNTER — Other Ambulatory Visit: Payer: Self-pay

## 2018-06-12 ENCOUNTER — Inpatient Hospital Stay: Payer: 59

## 2018-06-12 ENCOUNTER — Telehealth: Payer: Self-pay | Admitting: Hematology

## 2018-06-12 ENCOUNTER — Inpatient Hospital Stay (HOSPITAL_BASED_OUTPATIENT_CLINIC_OR_DEPARTMENT_OTHER): Payer: 59 | Admitting: Hematology

## 2018-06-12 ENCOUNTER — Encounter: Payer: Self-pay | Admitting: Hematology

## 2018-06-12 VITALS — BP 130/92 | HR 112 | Temp 97.8°F | Resp 17 | Ht 67.0 in | Wt 171.2 lb

## 2018-06-12 VITALS — BP 128/81 | HR 96

## 2018-06-12 DIAGNOSIS — Z9221 Personal history of antineoplastic chemotherapy: Secondary | ICD-10-CM

## 2018-06-12 DIAGNOSIS — F419 Anxiety disorder, unspecified: Secondary | ICD-10-CM

## 2018-06-12 DIAGNOSIS — K219 Gastro-esophageal reflux disease without esophagitis: Secondary | ICD-10-CM

## 2018-06-12 DIAGNOSIS — Z923 Personal history of irradiation: Secondary | ICD-10-CM

## 2018-06-12 DIAGNOSIS — E876 Hypokalemia: Secondary | ICD-10-CM | POA: Diagnosis not present

## 2018-06-12 DIAGNOSIS — R740 Nonspecific elevation of levels of transaminase and lactic acid dehydrogenase [LDH]: Secondary | ICD-10-CM

## 2018-06-12 DIAGNOSIS — C7971 Secondary malignant neoplasm of right adrenal gland: Secondary | ICD-10-CM

## 2018-06-12 DIAGNOSIS — R0781 Pleurodynia: Secondary | ICD-10-CM

## 2018-06-12 DIAGNOSIS — M549 Dorsalgia, unspecified: Secondary | ICD-10-CM

## 2018-06-12 DIAGNOSIS — R51 Headache: Secondary | ICD-10-CM

## 2018-06-12 DIAGNOSIS — Z95828 Presence of other vascular implants and grafts: Secondary | ICD-10-CM

## 2018-06-12 DIAGNOSIS — F329 Major depressive disorder, single episode, unspecified: Secondary | ICD-10-CM

## 2018-06-12 DIAGNOSIS — C3490 Malignant neoplasm of unspecified part of unspecified bronchus or lung: Secondary | ICD-10-CM

## 2018-06-12 DIAGNOSIS — Z5111 Encounter for antineoplastic chemotherapy: Secondary | ICD-10-CM | POA: Diagnosis not present

## 2018-06-12 DIAGNOSIS — Z79899 Other long term (current) drug therapy: Secondary | ICD-10-CM

## 2018-06-12 DIAGNOSIS — G8929 Other chronic pain: Secondary | ICD-10-CM

## 2018-06-12 DIAGNOSIS — C3431 Malignant neoplasm of lower lobe, right bronchus or lung: Secondary | ICD-10-CM | POA: Diagnosis not present

## 2018-06-12 DIAGNOSIS — R110 Nausea: Secondary | ICD-10-CM

## 2018-06-12 LAB — CBC WITH DIFFERENTIAL (CANCER CENTER ONLY)
Abs Immature Granulocytes: 0.05 10*3/uL (ref 0.00–0.07)
Basophils Absolute: 0 10*3/uL (ref 0.0–0.1)
Basophils Relative: 0 %
Eosinophils Absolute: 0.1 10*3/uL (ref 0.0–0.5)
Eosinophils Relative: 1 %
HCT: 35.1 % — ABNORMAL LOW (ref 36.0–46.0)
Hemoglobin: 10.8 g/dL — ABNORMAL LOW (ref 12.0–15.0)
Immature Granulocytes: 0 %
Lymphocytes Relative: 5 %
Lymphs Abs: 0.6 10*3/uL — ABNORMAL LOW (ref 0.7–4.0)
MCH: 29.2 pg (ref 26.0–34.0)
MCHC: 30.8 g/dL (ref 30.0–36.0)
MCV: 94.9 fL (ref 80.0–100.0)
MONOS PCT: 4 %
Monocytes Absolute: 0.4 10*3/uL (ref 0.1–1.0)
Neutro Abs: 10.8 10*3/uL — ABNORMAL HIGH (ref 1.7–7.7)
Neutrophils Relative %: 90 %
Platelet Count: 529 10*3/uL — ABNORMAL HIGH (ref 150–400)
RBC: 3.7 MIL/uL — ABNORMAL LOW (ref 3.87–5.11)
RDW: 15.6 % — ABNORMAL HIGH (ref 11.5–15.5)
WBC Count: 11.9 10*3/uL — ABNORMAL HIGH (ref 4.0–10.5)
nRBC: 0 % (ref 0.0–0.2)

## 2018-06-12 LAB — CMP (CANCER CENTER ONLY)
ALT: 55 U/L — AB (ref 0–44)
AST: 73 U/L — ABNORMAL HIGH (ref 15–41)
Albumin: 2.4 g/dL — ABNORMAL LOW (ref 3.5–5.0)
Alkaline Phosphatase: 623 U/L — ABNORMAL HIGH (ref 38–126)
Anion gap: 10 (ref 5–15)
BUN: 6 mg/dL (ref 6–20)
CO2: 25 mmol/L (ref 22–32)
CREATININE: 0.58 mg/dL (ref 0.44–1.00)
Calcium: 8.3 mg/dL — ABNORMAL LOW (ref 8.9–10.3)
Chloride: 101 mmol/L (ref 98–111)
GFR, Est AFR Am: >60 mL/min (ref >60)
GFR, Estimated: >60 mL/min (ref >60)
Glucose, Bld: 102 mg/dL — ABNORMAL HIGH (ref 70–99)
Potassium: 5.2 mmol/L — ABNORMAL HIGH (ref 3.5–5.1)
Sodium: 136 mmol/L (ref 135–145)
Total Bilirubin: 0.3 mg/dL (ref 0.3–1.2)
Total Protein: 7 g/dL (ref 6.5–8.1)

## 2018-06-12 MED ORDER — SODIUM CHLORIDE 0.9% FLUSH
10.0000 mL | INTRAVENOUS | Status: DC | PRN
  Administered 2018-06-12: 10 mL
  Filled 2018-06-12: qty 10

## 2018-06-12 MED ORDER — HEPARIN SOD (PORK) LOCK FLUSH 100 UNIT/ML IV SOLN
500.0000 [IU] | Freq: Once | INTRAVENOUS | Status: AC | PRN
  Administered 2018-06-12: 500 [IU]
  Filled 2018-06-12: qty 5

## 2018-06-12 MED ORDER — CYANOCOBALAMIN 1000 MCG/ML IJ SOLN
INTRAMUSCULAR | Status: AC
  Filled 2018-06-12: qty 1

## 2018-06-12 MED ORDER — SODIUM CHLORIDE 0.9 % IV SOLN
Freq: Once | INTRAVENOUS | Status: AC
  Administered 2018-06-12: 13:00:00 via INTRAVENOUS
  Filled 2018-06-12: qty 250

## 2018-06-12 MED ORDER — ONDANSETRON HCL 8 MG PO TABS: 8.0000 mg | ORAL_TABLET | Freq: Three times a day (TID) | ORAL | 2 refills | Status: DC | PRN

## 2018-06-12 MED ORDER — PROCHLORPERAZINE MALEATE 10 MG PO TABS
ORAL_TABLET | ORAL | Status: AC
  Filled 2018-06-12: qty 1

## 2018-06-12 MED ORDER — SODIUM CHLORIDE 0.9 % IV SOLN
500.0000 mg/m2 | Freq: Once | INTRAVENOUS | Status: AC
  Administered 2018-06-12: 1000 mg via INTRAVENOUS
  Filled 2018-06-12: qty 40

## 2018-06-12 MED ORDER — PROCHLORPERAZINE MALEATE 10 MG PO TABS
10.0000 mg | ORAL_TABLET | Freq: Once | ORAL | Status: AC
  Administered 2018-06-12: 10 mg via ORAL

## 2018-06-12 MED ORDER — CYANOCOBALAMIN 1000 MCG/ML IJ SOLN
1000.0000 ug | Freq: Once | INTRAMUSCULAR | Status: AC
  Administered 2018-06-12: 1000 ug via INTRAMUSCULAR

## 2018-06-12 NOTE — Progress Notes (Signed)
Per Dr. Burr Medico, okay to receive Alimta treatment today despite rise in liver enzymes.

## 2018-06-12 NOTE — Patient Instructions (Signed)
[facility] Discharge Instructions for Patients Receiving Chemotherapy  Today you received the following chemotherapy agents: Alimta  To help prevent nausea and vomiting after your treatment, we encourage you to take your nausea medication as directed.   If you develop nausea and vomiting that is not controlled by your nausea medication, call the clinic.   BELOW ARE SYMPTOMS THAT SHOULD BE REPORTED IMMEDIATELY:  *FEVER GREATER THAN 100.5 F  *CHILLS WITH OR WITHOUT FEVER  NAUSEA AND VOMITING THAT IS NOT CONTROLLED WITH YOUR NAUSEA MEDICATION  *UNUSUAL SHORTNESS OF BREATH  *UNUSUAL BRUISING OR BLEEDING  TENDERNESS IN MOUTH AND THROAT WITH OR WITHOUT PRESENCE OF ULCERS  *URINARY PROBLEMS  *BOWEL PROBLEMS  UNUSUAL RASH Items with * indicate a potential emergency and should be followed up as soon as possible.  Feel free to call the clinic should you have any questions or concerns. The clinic phone number is [phone].  Please show the [place] at check-in to the Emergency Department and triage nurse.  Pemetrexed injection What is this medicine? PEMETREXED (PEM e TREX ed) is a chemotherapy drug used to treat lung cancers like non-small cell lung cancer and mesothelioma. It may also be used to treat other cancers. This medicine may be used for other purposes; ask your health care provider or pharmacist if you have questions. COMMON BRAND NAME(S): Alimta What should I tell my health care provider before I take this medicine? They need to know if you have any of these conditions: -infection (especially a virus infection such as chickenpox, cold sores, or herpes) -kidney disease -low blood counts, like low white cell, platelet, or red cell counts -lung or breathing disease, like asthma -radiation therapy -an unusual or allergic reaction to pemetrexed, other medicines, foods, dyes, or preservative -pregnant or trying to get pregnant -breast-feeding How  should I use this medicine? This drug is given as an infusion into a vein. It is administered in a hospital or clinic by a specially trained health care professional. Talk to your pediatrician regarding the use of this medicine in children. Special care may be needed. Overdosage: If you think you have taken too much of this medicine contact a poison control center or emergency room at once. NOTE: This medicine is only for you. Do not share this medicine with others. What if I miss a dose? It is important not to miss your dose. Call your doctor or health care professional if you are unable to keep an appointment. What may interact with this medicine? This medicine may interact with the following medications: -Ibuprofen This list may not describe all possible interactions. Give your health care provider a list of all the medicines, herbs, non-prescription drugs, or dietary supplements you use. Also tell them if you smoke, drink alcohol, or use illegal drugs. Some items may interact with your medicine. What should I watch for while using this medicine? Visit your doctor for checks on your progress. This drug may make you feel generally unwell. This is not uncommon, as chemotherapy can affect healthy cells as well as cancer cells. Report any side effects. Continue your course of treatment even though you feel ill unless your doctor tells you to stop. In some cases, you may be given additional medicines to help with side effects. Follow all directions for their use. Call your doctor or health care professional for advice if you get a fever, chills or sore throat, or other symptoms of a cold or flu. Do not treat yourself.  This drug decreases your body's ability to fight infections. Try to avoid being around people who are sick. This medicine may increase your risk to bruise or bleed. Call your doctor or health care professional if you notice any unusual bleeding. Be careful brushing and flossing your teeth  or using a toothpick because you may get an infection or bleed more easily. If you have any dental work done, tell your dentist you are receiving this medicine. Avoid taking products that contain aspirin, acetaminophen, ibuprofen, naproxen, or ketoprofen unless instructed by your doctor. These medicines may hide a fever. Call your doctor or health care professional if you get diarrhea or mouth sores. Do not treat yourself. To protect your kidneys, drink water or other fluids as directed while you are taking this medicine. Do not become pregnant while taking this medicine or for 6 months after stopping it. Women should inform their doctor if they wish to become pregnant or think they might be pregnant. Men should not father a child while taking this medicine and for 3 months after stopping it. This may interfere with the ability to father a child. You should talk to your doctor or health care professional if you are concerned about your fertility. There is a potential for serious side effects to an unborn child. Talk to your health care professional or pharmacist for more information. Do not breast-feed an infant while taking this medicine or for 1 week after stopping it. What side effects may I notice from receiving this medicine? Side effects that you should report to your doctor or health care professional as soon as possible: -allergic reactions like skin rash, itching or hives, swelling of the face, lips, or tongue -breathing problems -redness, blistering, peeling or loosening of the skin, including inside the mouth -signs and symptoms of bleeding such as bloody or black, tarry stools; red or dark-brown urine; spitting up blood or brown material that looks like coffee grounds; red spots on the skin; unusual bruising or bleeding from the eye, gums, or nose -signs and symptoms of infection like fever or chills; cough; sore throat; pain or trouble passing urine -signs and symptoms of kidney injury like  trouble passing urine or change in the amount of urine -signs and symptoms of liver injury like dark yellow or brown urine; general ill feeling or flu-like symptoms; light-colored stools; loss of appetite; nausea; right upper belly pain; unusually weak or tired; yellowing of the eyes or skin Side effects that usually do not require medical attention (report to your doctor or health care professional if they continue or are bothersome): -constipation -mouth sores -nausea, vomiting -unusually weak or tired This list may not describe all possible side effects. Call your doctor for medical advice about side effects. You may report side effects to FDA at 1-800-FDA-1088. Where should I keep my medicine? This drug is given in a hospital or clinic and will not be stored at home. NOTE: This sheet is a summary. It may not cover all possible information. If you have questions about this medicine, talk to your doctor, pharmacist, or health care provider.  2019 [employer] (2017-04-27 16:11:33)

## 2018-06-12 NOTE — Telephone Encounter (Signed)
Scheduled appt per 3/23 los.

## 2018-06-15 ENCOUNTER — Encounter: Payer: Self-pay | Admitting: Hematology

## 2018-06-15 ENCOUNTER — Telehealth: Payer: Self-pay | Admitting: Hematology

## 2018-06-15 NOTE — Telephone Encounter (Signed)
Faxed medical records to [facility]. Release [id number]

## 2018-06-16 ENCOUNTER — Encounter: Payer: Self-pay | Admitting: Hematology

## 2018-06-16 NOTE — Telephone Encounter (Signed)
Dr. Burr Medico, please advise scheduling.

## 2018-06-19 ENCOUNTER — Other Ambulatory Visit: Payer: Self-pay | Admitting: Hematology

## 2018-06-19 ENCOUNTER — Telehealth: Payer: Self-pay

## 2018-06-19 DIAGNOSIS — C3490 Malignant neoplasm of unspecified part of unspecified bronchus or lung: Secondary | ICD-10-CM

## 2018-06-19 NOTE — Telephone Encounter (Signed)
Spoke with Mariea Clonts at [facility] hospital Radiology department to let him know patient has appointment on Monday 4/6, CT chest approved but CT abdomen and pelvis not, so they will approve CT abdomen, order has been changed.  He will change it on his in to reflect this.  [facility] Approval [id number]

## 2018-06-20 ENCOUNTER — Encounter: Payer: Self-pay | Admitting: Hematology

## 2018-06-21 ENCOUNTER — Telehealth: Payer: Self-pay | Admitting: Hematology

## 2018-06-21 NOTE — Telephone Encounter (Signed)
R/s appt per 3/30 sch message - left message for patient with appt date and time

## 2018-06-25 ENCOUNTER — Other Ambulatory Visit: Payer: Self-pay | Admitting: Hematology

## 2018-06-25 DIAGNOSIS — C3490 Malignant neoplasm of unspecified part of unspecified bronchus or lung: Secondary | ICD-10-CM

## 2018-06-26 ENCOUNTER — Other Ambulatory Visit: Payer: Self-pay | Admitting: Hematology

## 2018-06-26 ENCOUNTER — Ambulatory Visit (HOSPITAL_COMMUNITY): Admission: RE | Admit: 2018-06-26 | Payer: 59 | Source: Ambulatory Visit

## 2018-06-26 DIAGNOSIS — C3490 Malignant neoplasm of unspecified part of unspecified bronchus or lung: Secondary | ICD-10-CM

## 2018-06-26 MED ORDER — PROCHLORPERAZINE MALEATE 10 MG PO TABS: 10.0000 mg | ORAL_TABLET | Freq: Four times a day (QID) | ORAL | 3 refills | Status: DC | PRN

## 2018-06-26 MED ORDER — ALPRAZOLAM 0.25 MG PO TABS: 0.2500 mg | ORAL_TABLET | Freq: Every evening | ORAL | 0 refills | Status: DC | PRN

## 2018-06-28 ENCOUNTER — Other Ambulatory Visit: Payer: Self-pay | Admitting: Hematology

## 2018-07-04 ENCOUNTER — Other Ambulatory Visit: Payer: 59

## 2018-07-04 ENCOUNTER — Ambulatory Visit: Payer: 59

## 2018-07-04 ENCOUNTER — Telehealth: Payer: Self-pay | Admitting: Hematology

## 2018-07-04 ENCOUNTER — Ambulatory Visit: Payer: 59 | Admitting: Hematology

## 2018-07-04 NOTE — Telephone Encounter (Signed)
Unable to reach patient per 4/14 sch message left message for patient to call back for r/s

## 2018-07-05 ENCOUNTER — Inpatient Hospital Stay: Payer: BLUE CROSS/BLUE SHIELD

## 2018-07-05 ENCOUNTER — Encounter: Payer: Self-pay | Admitting: Hematology

## 2018-07-05 ENCOUNTER — Telehealth: Payer: Self-pay | Admitting: Hematology

## 2018-07-05 ENCOUNTER — Inpatient Hospital Stay: Payer: BLUE CROSS/BLUE SHIELD | Attending: Hematology

## 2018-07-05 ENCOUNTER — Other Ambulatory Visit: Payer: Self-pay

## 2018-07-05 ENCOUNTER — Inpatient Hospital Stay (HOSPITAL_BASED_OUTPATIENT_CLINIC_OR_DEPARTMENT_OTHER): Payer: BLUE CROSS/BLUE SHIELD | Admitting: Hematology

## 2018-07-05 ENCOUNTER — Inpatient Hospital Stay: Payer: BLUE CROSS/BLUE SHIELD | Admitting: Hematology

## 2018-07-05 VITALS — BP 135/94 | HR 115 | Temp 98.3°F | Resp 18 | Ht 67.0 in | Wt 169.7 lb

## 2018-07-05 VITALS — HR 92

## 2018-07-05 DIAGNOSIS — K219 Gastro-esophageal reflux disease without esophagitis: Secondary | ICD-10-CM | POA: Diagnosis not present

## 2018-07-05 DIAGNOSIS — C7971 Secondary malignant neoplasm of right adrenal gland: Secondary | ICD-10-CM | POA: Insufficient documentation

## 2018-07-05 DIAGNOSIS — M549 Dorsalgia, unspecified: Secondary | ICD-10-CM

## 2018-07-05 DIAGNOSIS — F329 Major depressive disorder, single episode, unspecified: Secondary | ICD-10-CM | POA: Diagnosis not present

## 2018-07-05 DIAGNOSIS — R112 Nausea with vomiting, unspecified: Secondary | ICD-10-CM | POA: Insufficient documentation

## 2018-07-05 DIAGNOSIS — R740 Nonspecific elevation of levels of transaminase and lactic acid dehydrogenase [LDH]: Secondary | ICD-10-CM

## 2018-07-05 DIAGNOSIS — R079 Chest pain, unspecified: Secondary | ICD-10-CM | POA: Insufficient documentation

## 2018-07-05 DIAGNOSIS — Z5111 Encounter for antineoplastic chemotherapy: Secondary | ICD-10-CM | POA: Diagnosis not present

## 2018-07-05 DIAGNOSIS — R7989 Other specified abnormal findings of blood chemistry: Secondary | ICD-10-CM | POA: Diagnosis not present

## 2018-07-05 DIAGNOSIS — Z9884 Bariatric surgery status: Secondary | ICD-10-CM

## 2018-07-05 DIAGNOSIS — R222 Localized swelling, mass and lump, trunk: Secondary | ICD-10-CM | POA: Diagnosis not present

## 2018-07-05 DIAGNOSIS — Z9221 Personal history of antineoplastic chemotherapy: Secondary | ICD-10-CM | POA: Diagnosis not present

## 2018-07-05 DIAGNOSIS — C3490 Malignant neoplasm of unspecified part of unspecified bronchus or lung: Secondary | ICD-10-CM

## 2018-07-05 DIAGNOSIS — C3431 Malignant neoplasm of lower lobe, right bronchus or lung: Secondary | ICD-10-CM | POA: Diagnosis not present

## 2018-07-05 DIAGNOSIS — G8929 Other chronic pain: Secondary | ICD-10-CM | POA: Insufficient documentation

## 2018-07-05 DIAGNOSIS — E876 Hypokalemia: Secondary | ICD-10-CM

## 2018-07-05 DIAGNOSIS — Z79899 Other long term (current) drug therapy: Secondary | ICD-10-CM | POA: Diagnosis not present

## 2018-07-05 DIAGNOSIS — F419 Anxiety disorder, unspecified: Secondary | ICD-10-CM | POA: Diagnosis not present

## 2018-07-05 DIAGNOSIS — Z95828 Presence of other vascular implants and grafts: Secondary | ICD-10-CM

## 2018-07-05 DIAGNOSIS — Z923 Personal history of irradiation: Secondary | ICD-10-CM

## 2018-07-05 LAB — CBC WITH DIFFERENTIAL (CANCER CENTER ONLY)
Abs Immature Granulocytes: 0.04 10*3/uL (ref 0.00–0.07)
Basophils Absolute: 0 10*3/uL (ref 0.0–0.1)
Basophils Relative: 0 %
Eosinophils Absolute: 0 10*3/uL (ref 0.0–0.5)
Eosinophils Relative: 0 %
HCT: 34.7 % — ABNORMAL LOW (ref 36.0–46.0)
Hemoglobin: 10.6 g/dL — ABNORMAL LOW (ref 12.0–15.0)
Immature Granulocytes: 0 %
Lymphocytes Relative: 10 %
Lymphs Abs: 1 10*3/uL (ref 0.7–4.0)
MCH: 28.6 pg (ref 26.0–34.0)
MCHC: 30.5 g/dL (ref 30.0–36.0)
MCV: 93.5 fL (ref 80.0–100.0)
Monocytes Absolute: 0.7 10*3/uL (ref 0.1–1.0)
Monocytes Relative: 8 %
Neutro Abs: 7.5 10*3/uL (ref 1.7–7.7)
Neutrophils Relative %: 82 %
Platelet Count: 621 10*3/uL — ABNORMAL HIGH (ref 150–400)
RBC: 3.71 MIL/uL — ABNORMAL LOW (ref 3.87–5.11)
RDW: 16.6 % — ABNORMAL HIGH (ref 11.5–15.5)
WBC Count: 9.3 10*3/uL (ref 4.0–10.5)
nRBC: 0 % (ref 0.0–0.2)

## 2018-07-05 LAB — CMP (CANCER CENTER ONLY)
ALT: 39 U/L (ref 0–44)
AST: 33 U/L (ref 15–41)
Albumin: 2.5 g/dL — ABNORMAL LOW (ref 3.5–5.0)
Alkaline Phosphatase: 548 U/L — ABNORMAL HIGH (ref 38–126)
Anion gap: 13 (ref 5–15)
BUN: 8 mg/dL (ref 6–20)
CO2: 24 mmol/L (ref 22–32)
Calcium: 8.3 mg/dL — ABNORMAL LOW (ref 8.9–10.3)
Chloride: 100 mmol/L (ref 98–111)
Creatinine: 0.64 mg/dL (ref 0.44–1.00)
GFR, Est AFR Am: >60 mL/min (ref >60)
GFR, Estimated: >60 mL/min (ref >60)
Glucose, Bld: 120 mg/dL — ABNORMAL HIGH (ref 70–99)
Potassium: 3.7 mmol/L (ref 3.5–5.1)
Sodium: 137 mmol/L (ref 135–145)
Total Bilirubin: 0.2 mg/dL — ABNORMAL LOW (ref 0.3–1.2)
Total Protein: 6.8 g/dL (ref 6.5–8.1)

## 2018-07-05 MED ORDER — PROCHLORPERAZINE MALEATE 10 MG PO TABS
ORAL_TABLET | ORAL | Status: AC
  Filled 2018-07-05: qty 1

## 2018-07-05 MED ORDER — PROCHLORPERAZINE MALEATE 10 MG PO TABS
10.0000 mg | ORAL_TABLET | Freq: Once | ORAL | Status: AC
  Administered 2018-07-05: 10 mg via ORAL

## 2018-07-05 MED ORDER — SODIUM CHLORIDE 0.9% FLUSH
10.0000 mL | INTRAVENOUS | Status: DC | PRN
  Administered 2018-07-05: 15:00:00 10 mL
  Filled 2018-07-05: qty 10

## 2018-07-05 MED ORDER — SODIUM CHLORIDE 0.9 % IV SOLN
500.0000 mg/m2 | Freq: Once | INTRAVENOUS | Status: AC
  Administered 2018-07-05: 1000 mg via INTRAVENOUS
  Filled 2018-07-05: qty 40

## 2018-07-05 MED ORDER — SODIUM CHLORIDE 0.9 % IV SOLN
Freq: Once | INTRAVENOUS | Status: AC
  Administered 2018-07-05: 15:00:00 via INTRAVENOUS
  Filled 2018-07-05: qty 250

## 2018-07-05 MED ORDER — HEPARIN SOD (PORK) LOCK FLUSH 100 UNIT/ML IV SOLN
500.0000 [IU] | Freq: Once | INTRAVENOUS | Status: AC | PRN
  Administered 2018-07-05: 15:00:00 500 [IU]
  Filled 2018-07-05: qty 5

## 2018-07-05 MED ORDER — SODIUM CHLORIDE 0.9% FLUSH
10.0000 mL | INTRAVENOUS | Status: DC | PRN
  Administered 2018-07-05: 10 mL
  Filled 2018-07-05: qty 10

## 2018-07-05 NOTE — Progress Notes (Signed)
[place]   [phone] [phone]   Clinic Follow up Note   Patient Care Team: Redmond School, MD as PCP - General (Internal Medicine)  Date of Service:  07/05/2018  CHIEF COMPLAINT: Metastatic non-small cell lung cancer  SUMMARY OF ONCOLOGIC HISTORY: Oncology History   Cancer Staging Metastatic non-small cell lung cancer [facility]) Staging form: Lung, AJCC 8th Edition - Clinical stage from 10/17/2017: Stage IV (cT3, cN1, cM1b) - Signed by Truitt Merle, MD on 10/17/2017       Metastatic non-small cell lung cancer ([place])   09/20/2017 Imaging    US Breast Left 09/20/17  IMPRESSION Indeterminte palpable mass measuring 3.2x1.7x2.8 cm  inferior medial to the inframammary fold of the left breast.      09/20/2017 Initial Biopsy    Diagnosis 09/20/17 Soft Tissue Needle Core Biopsy, inferior medial to IMF - ADENOCARCINOMA. - SEE MICROSCOPIC DESCRIPTION.  Microscopic Comment Immunohistochemistry will be performed and reported as an addendum. (JDP:ah 09/23/17) ADDENDUM: Immunohistochemistry shows the tumor is strongly positive with cytokeratin AE1/AE3, cytokeratin 7 and shows moderate weak positivity with CDX-2. The tumor is negative with estrogen receptor, progesterone receptor, GCDFP, GATA-3, Napsin A, TTF-1, WT-1 and cytokeratin 20. The immunophenotype is consistent with metastatic carcinoma. The combination of CDX-2 and cytokeratin 7 positivity raises the possibility of an upper gastrointestinal primary. Clinical correlation is essential.    10/06/2017 Initial Diagnosis    Metastatic adenocarcinoma involving soft tissue with unknown primary site [facility])    10/10/2017 Procedure    Upper Endoscopy by Dr. Silverio Decamp 10/10/17  IMPRESSION - Normal esophagus. - Z-line regular, 35 cm from the incisors. - Gastric bypass with a normal-sized pouch and intact staple line. Gastrojejunal anastomosis characterized by healthy appearing mucosa. - No specimens collected.    10/12/2017 Imaging    CT CAP WO Contrast 10/12/17  IMPRESSION: 7 cm central right lung mass involving the right hilum, with postobstructive collapse of the right middle lobe, highly suspicious for primary bronchogenic carcinoma. 5 cm masslike opacity in the superior segment of the right lower lobe may represent carcinoma or postobstructive pneumonitis. 3.3 cm soft tissue mass in the lower anterior chest wall soft tissues, suspicious for metastatic disease. No evidence of abdominal or pelvic metastatic disease.     10/14/2017 Imaging    MRI Brain 10/14/17  IMPRESSION: 1. No metastatic disease or acute intracranial abnormality. Normal MRI appearance of the brain. 2. 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      10/17/2017 Cancer Staging    Staging form: Lung, AJCC 8th Edition - Clinical stage from 10/17/2017: Stage IV (cT3, cN1, cM1b) - Signed by Truitt Merle, MD on 10/17/2017    10/24/2017 - 11/04/2017 Radiation Therapy     The Right lung mass was treated to 30 Gy in 10 fractions of 3 Gy    11/08/2017 -  Chemotherapy    -first line chemo with carboplatin, paclitaxel, Atezolizumab and bevacizumab every 3weeks starting 11/08/17.  -Switched to maintenance therapy with avastin and atezolizumab q3weeks on 03/24/18    01/10/2018 Imaging    01/10/2018 CT CA IMPRESSION: 1. Response to therapy of central right lower lobe lung mass. Re-expansion of the right middle lobe with significantly improved right lower lobe postobstructive pneumonitis. Residual geographic airspace and ground-glass opacity within the medial right lung, primarily favored to be radiation induced. 2. Near complete resolution of presternal soft tissue mass. 3. New right adrenal nodule since 10/11/2017, suspicious for metastatic disease. 4. Right-sided Port-A-Cath with probable nonocclusive thrombus along  the catheter within the right brachiocephalic vein. 5. Age advanced coronary artery atherosclerosis.  Recommend assessment of coronary risk factors and consideration of medical therapy. 6. Aortic atherosclerosis (ICD10-I70.0) and emphysema (ICD10-J43.9).    03/10/2018 Imaging    CT CAP W Contrast 03/10/18 IMPRESSION: 1. Radiation changes involving the right paramediastinal lung and right hilum but no findings suspicious for residual or recurrent tumor. 2. No evidence of pulmonary metastatic disease. Stable emphysematous changes and areas of pulmonary scarring. 3. Stable 11 mm right adrenal gland nodule. 4. No findings for metastatic disease involving the liver or bony structures.    03/24/2018 - 04/24/2018 Chemotherapy    The patient had bevacizumab (AVASTIN) 1,300 mg in sodium chloride 0.9 % 100 mL chemo infusion, 15.5 mg/kg = 1,275 mg, Intravenous,  Once, 1 of 4 cycles Administration: 1,300 mg (03/24/2018) atezolizumab (TECENTRIQ) 1,200 mg in sodium chloride 0.9 % 250 mL chemo infusion, 1,200 mg, Intravenous, Once, 1 of 4 cycles Administration: 1,200 mg (03/24/2018)  for chemotherapy treatment.     04/11/2018 Imaging    CT ANGIO CHEST PE W OR WO CONTRAST  IMPRESSION: 1. No evidence of acute pulmonary embolism. 2. Progressive radiation changes in the right perihilar region. Cavitary retro hilar mass and right paratracheal lymph node have enlarged, suspicious for local recurrence/disease progression. 3. Enlarging right adrenal nodule suspicious for metastatic disease. 4. New right pleural effusion with increased atelectasis at both lung bases. 5. These results will be called to the ordering clinician or representative by the Radiology Department at the imaging location.     04/21/2018 Imaging    NUCLEAR MEDICINE PET SKULL BASE TO THIGH IMPRESSION: 1. Marked hypermetabolism in the amorphous soft tissue involving the right hilum. This represents the central component of the apparent radiation scarring. 2. Hypermetabolic mediastinal nodal metastases with nodal metastases identified in  the anterior right juxta diaphragmatic fat. 3. Hypermetabolic right adrenal metastasis. 4. Interval progression of loculated right pleural effusion.     05/02/2018 -  Chemotherapy    Second line chemo Alimta 500mg /m2 very 3 weeks       CURRENT THERAPY:  Second line Alimta 500mg /m2 every 3 weeks, started 05/02/2018  INTERVAL HISTORY:  Yvonne Lang is here for a follow up and treatment. She presents to the clinic today by herself. She notes she is doing moderately well. She has been more nauseous with eating over the last 2 weeks and her breathing is becoming more difficult and chest nodule is still there. She has taken antiemetics. She only vomited once. She makes herself eat. She denies abdominal pain or constipation. She only lost 2 pounds. She has been the breathing treatment twice a day. She denies productive cough.     REVIEW OF SYSTEMS:   Constitutional: Denies fevers, chills or abnormal weight loss (+) Lower  Eyes: Denies blurriness of vision Ears, nose, mouth, throat, and face: Denies mucositis or sore throat Respiratory: Denies cough or wheezes (+) intermittent SOB upon exertion Cardiovascular: Denies palpitation, chest discomfort or lower extremity swelling Gastrointestinal:  Denies heartburn or change in bowel habits (+) Nausea with eating  Skin: Denies abnormal skin rashes MSK: (+) Pain below right shoulder blade Lymphatics: Denies new lymphadenopathy or easy bruising Neurological:Denies numbness, tingling or new weaknesses Behavioral/Psych: Mood is stable, no new changes  All other systems were reviewed with the patient and are negative.  MEDICAL HISTORY:  Past Medical History:  Diagnosis Date   Anxiety    Bronchitis    COPD (chronic obstructive pulmonary disease) ([place])  Depression    GERD (gastroesophageal reflux disease)    H/O gastric bypass    1999   Irritable bowel syndrome    Lung cancer ([place])     SURGICAL HISTORY: Past Surgical History:   Procedure Laterality Date   BACK SURGERY     CHOLECYSTECTOMY     COLON SURGERY     for a kink in her colon and 12 inchs removed   COLONOSCOPY N/A 12/13/2013   Procedure: COLONOSCOPY;  Surgeon: Rogene Houston, MD;  Location: [facility];  Service: Endoscopy;  Laterality: N/A;  200   ESOPHAGOGASTRODUODENOSCOPY N/A 12/13/2013   Procedure: ESOPHAGOGASTRODUODENOSCOPY (EGD);  Surgeon: Rogene Houston, MD;  Location: [facility];  Service: Endoscopy;  Laterality: N/A;   Gastric Bypas  2000   IR IMAGING GUIDED PORT INSERTION  11/14/2017    I have reviewed the social history and family history with the patient and they are unchanged from previous note.  ALLERGIES:  is allergic to codeine.  MEDICATIONS:  Current Outpatient Medications  Medication Sig Dispense Refill   albuterol (PROVENTIL HFA) 108 (90 Base) MCG/ACT inhaler Inhale 2 puffs into the lungs 2 (two) times daily. 1 Inhaler 2   albuterol (PROVENTIL) 4 MG tablet Take 4 mg by mouth 2 (two) times daily.      ALPRAZolam (XANAX) 0.25 MG tablet TAKE 1 TABLET BY MOUTH AT BEDTIME AS NEEDED FOR ANXIETY (Patient taking differently: Take 0.25 mg by mouth at bedtime as needed for sleep. ) 30 tablet 0   ALPRAZolam (XANAX) 0.25 MG tablet Take 1 tablet (0.25 mg total) by mouth at bedtime as needed for anxiety or sleep. 30 tablet 0   butalbital-acetaminophen-caffeine (FIORICET, ESGIC) 50-325-40 MG tablet Take 1-2 tablets by mouth every 6 (six) hours as needed for headache. 60 tablet 1   CALCIUM-MAGNESIUM-ZINC PO Take 1 tablet by mouth daily.     dexamethasone (DECADRON) 4 MG tablet Take 1 tab two times a day the day before Alimta chemo. Take 2 tabs the day after chemo, then take 2 tabs two times a day for 2 days. 30 tablet 1   dicyclomine (BENTYL) 20 MG tablet Take 1 tablet (20 mg total) by mouth every 6 (six) hours. 60 tablet 1   DULoxetine (CYMBALTA) 60 MG capsule Take 60 mg by mouth daily.     folic acid (FOLVITE) 1 MG tablet Take  1 tablet (1 mg total) by mouth daily. Start 5-7 days before Alimta chemotherapy. Continue until 21 days after Alimta completed. 100 tablet 3   furosemide (LASIX) 20 MG tablet Take 1 tablet (20 mg total) by mouth daily as needed. 30 tablet 0   gabapentin (NEURONTIN) 300 MG capsule Take 300 mg by mouth daily as needed (for pain).   4   HYDROcodone-acetaminophen (NORCO) 10-325 MG tablet Take 2 tablets by mouth every 6 (six) hours as needed.     ipratropium-albuterol (DUONEB) 0.5-2.5 (3) MG/3ML SOLN Take 3 mLs by nebulization every 4 (four) hours as needed. (Patient taking differently: Take 3 mLs by nebulization every 4 (four) hours as needed (for shortness of breath). ) 360 mL 1   lidocaine (XYLOCAINE) 2 % solution as directed.     lidocaine-prilocaine (EMLA) cream Apply 1 application topically as needed. Apply to port one hour prior to port access/chemo 30 g 2   magic mouthwash w/lidocaine SOLN Take 5 mLs by mouth 3 (three) times daily. Swish and spit 3 x daily 240 mL 0   MOVANTIK 25 MG TABS tablet  Take 1 tablet by mouth daily.     Multiple Vitamin (MULTIVITAMIN) tablet Take 1 tablet by mouth daily.     omeprazole (PRILOSEC) 20 MG capsule Take 1 capsule (20 mg total) by mouth 2 (two) times daily before a meal. 60 capsule 0   ondansetron (ZOFRAN) 8 MG tablet Take 1 tablet (8 mg total) by mouth every 8 (eight) hours as needed for nausea or vomiting. 30 tablet 2   Oxycodone HCl 10 MG TABS Take 20 mg by mouth 3 (three) times daily.     potassium chloride SA (K-DUR,KLOR-CON) 20 MEQ tablet Take 1 tablet (20 mEq total) by mouth daily. 60 tablet 0   prochlorperazine (COMPAZINE) 10 MG tablet Take 1 tablet (10 mg total) by mouth every 6 (six) hours as needed for nausea or vomiting. 30 tablet 3   rOPINIRole (REQUIP) 0.5 MG tablet Take 0.5 mg by mouth at bedtime.   11   sucralfate (CARAFATE) 1 g tablet TAKE 1 TABLET(1 GRAM) BY MOUTH FOUR TIMES DAILY 120 tablet 0   traMADol (ULTRAM) 50 MG tablet  Take 1 tablet (50 mg total) by mouth every 6 (six) hours as needed. 90 tablet 0   TRELEGY ELLIPTA 100-62.5-25 MCG/INH AEPB Inhale 1 puff into the lungs daily.   5   zolpidem (AMBIEN) 10 MG tablet Take 1 tablet by mouth at bedtime.     No current facility-administered medications for this visit.     PHYSICAL EXAMINATION: ECOG PERFORMANCE STATUS: 2 - Symptomatic, <50% confined to bed  Vitals:   07/05/18 1342  BP: (!) 135/94  Pulse: (!) 115  Resp: 18  Temp: 98.3 F (36.8 C)  SpO2: 97%   Filed Weights   07/05/18 1342  Weight: 169 lb 11.2 oz (77 kg)   GENERAL:alert, no distress and comfortable SKIN: skin color, texture, turgor are normal, no rashes or significant lesions EYES: normal, Conjunctiva are pink and non-injected, sclera clear OROPHARYNX:no exudate, no erythema and lips, buccal mucosa, and tongue normal  NECK: supple, thyroid normal size, non-tender, without nodularity LYMPH:  no palpable lymphadenopathy in the cervical, axillary or inguinal LUNGS: clear to auscultation and percussion with normal breathing effort mild wheezes on right side HEART: regular rate & rhythm and no murmurs and no lower extremity edema ABDOMEN:abdomen soft, non-tender and normal bowel sounds Musculoskeletal:no cyanosis of digits and no clubbing (+) palpable Left sub-sternum pea sized nodule.  NEURO: alert & oriented x 3 with fluent speech, no focal motor/sensory deficits  LABORATORY DATA:  I have reviewed the data as listed CBC Latest Ref Rng & Units 07/05/2018 06/12/2018 05/23/2018  WBC 4.0 - 10.5 K/uL 9.3 11.9(H) 11.6(H)  Hemoglobin 12.0 - 15.0 g/dL 10.6(L) 10.8(L) 10.3(L)  Hematocrit 36.0 - 46.0 % 34.7(L) 35.1(L) 33.3(L)  Platelets 150 - 400 K/uL 621(H) 529(H) 527(H)     CMP Latest Ref Rng & Units 07/05/2018 06/12/2018 05/23/2018  Glucose 70 - 99 mg/dL 120(H) 102(H) 128(H)  BUN 6 - 20 mg/dL 8 6 6   Creatinine 0.44 - 1.00 mg/dL 0.64 0.58 0.61  Sodium 135 - 145 mmol/L 137 136 137  Potassium  3.5 - 5.1 mmol/L 3.7 5.2(H) 3.9  Chloride 98 - 111 mmol/L 100 101 101  CO2 22 - 32 mmol/L 24 25 27   Calcium 8.9 - 10.3 mg/dL 8.3(L) 8.3(L) 8.4(L)  Total Protein 6.5 - 8.1 g/dL 6.8 7.0 7.0  Total Bilirubin 0.3 - 1.2 mg/dL 0.2(L) 0.3 0.3  Alkaline Phos 38 - 126 U/L 548(H) 623(H) 576(H)  AST 15 -  41 U/L 33 73(H) 37  ALT 0 - 44 U/L 39 55(H) 43      RADIOGRAPHIC STUDIES: I have personally reviewed the radiological images as listed and agreed with the findings in the report. No results found.   ASSESSMENT & PLAN:  Tifani Dack is a 53 y.o. female with   1. 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 -Diagnosed in 09/2017. Treated with chemo and radiation. She was initially onfirst linecarboplatin, paclitaxel, Atezolizumab and bevacizumab every 3weekss/p 6 cycles, with good partial response.Switched tomaintenance therapy withAvastin and Atezolizumabq3weeksstartedon 03/24/18. -Due to disease progression, chemoswitched tosecond line alimta 500mg /m2 every 3 weekson 05/02/2018, she has been tolerating well  -She is on folic acid 1 mg, tolerating well. Will continue, continue B12 injection every 3 months  -She had to postpone scan due to COVID-19 which is understandable. Will set scan to be done in 1-2 weeks.  -She has experienced more nausea and SOB and low appetite with cycle 3 chemo. On exam today she had a left sub-sternum pea sized nodule. Will monitor.  -Labs reviewed, CBC WNL except HG 10.6, PLT 621K, CMP unremarkable. Overall adequate to proceed with Alimta today  -I encouraged her to stay active as her cancer and thrombocytosis slightly increase her risk of blood clots.  -F/u in 3 weeks   2. Pleuriticrightchest pain, SOB -located on the right lower mid axillary area, whichis sharp and worsened with coughing and deep breathes.It startedaround1/19/20. -Probably related to her lung cancer -She was previously admitted to hospital  on 04/28/18 for dyspnea and chest pain -SOB has progressed lately She has been using breathing treatment BID.  -Her breathing is better, adequate with occasionally wheezes.  -Physical exam today showed, mild wheezes, mainly on right side.  She will continue using inhaler and nebulizer at home  3. Frontal Headaches -f/u with Dr. Mickeal Skinner -overall stableand improved   4. Anxiety and Depression  -Currently on Xanax and Cymbalta. Stable   5. Chronic back pain  -Currently on Tramadol 50 mg q6Hr. Stable   6. Low appetite, Nausea -Currently on Mirtazapine, omeprazole, ondansetron,prochlorperazine, and sucralfate.  -She again lost 2 pounds. I encouraged her to increase her calorie intake.   7. Hypokalemia  -OnKCL 32meq daily   8.  Transaminitis -Probably related to chemotherapy, will continue monitoring.  9.Goal of care discussion  -The patient understands the goal of care is palliative. -she is full code now   Plan -Lab reviewed, adequate for treatment, will proceed to cycle 4 Alimta today  -She will take dexamethasone for the next 3 to 5 days for her wheezing. -restaging CT CAP in 2 weeks at [place], flush, f/u and Alimta in 3 weeks    No problem-specific Assessment & Plan notes found for this encounter.   No orders of the defined types were placed in this encounter.  All questions were answered. The patient knows to call the clinic with any problems, questions or concerns. No barriers to learning was detected. I spent 20 minutes counseling the patient face to face. The total time spent in the appointment was 25 minutes and more than 50% was on counseling and review of test results     Truitt Merle, MD 07/05/2018   I, Joslyn Devon, am acting as scribe for Truitt Merle, MD.   I have reviewed the above documentation for accuracy and completeness, and I agree with the above.

## 2018-07-05 NOTE — Patient Instructions (Signed)
[facility] Discharge Instructions for Patients Receiving Chemotherapy  Today you received the following chemotherapy agents: Alimta  To help prevent nausea and vomiting after your treatment, we encourage you to take your nausea medication as directed.   If you develop nausea and vomiting that is not controlled by your nausea medication, call the clinic.   BELOW ARE SYMPTOMS THAT SHOULD BE REPORTED IMMEDIATELY:  *FEVER GREATER THAN 100.5 F  *CHILLS WITH OR WITHOUT FEVER  NAUSEA AND VOMITING THAT IS NOT CONTROLLED WITH YOUR NAUSEA MEDICATION  *UNUSUAL SHORTNESS OF BREATH  *UNUSUAL BRUISING OR BLEEDING  TENDERNESS IN MOUTH AND THROAT WITH OR WITHOUT PRESENCE OF ULCERS  *URINARY PROBLEMS  *BOWEL PROBLEMS  UNUSUAL RASH Items with * indicate a potential emergency and should be followed up as soon as possible.  Feel free to call the clinic should you have any questions or concerns. The clinic phone number is [phone].  Please show the [facility] at check-in to the Emergency Department and triage nurse.  Pemetrexed injection What is this medicine? PEMETREXED (PEM e TREX ed) is a chemotherapy drug used to treat lung cancers like non-small cell lung cancer and mesothelioma. It may also be used to treat other cancers. This medicine may be used for other purposes; ask your health care provider or pharmacist if you have questions. COMMON BRAND NAME(S): Alimta What should I tell my health care provider before I take this medicine? They need to know if you have any of these conditions: -infection (especially a virus infection such as chickenpox, cold sores, or herpes) -kidney disease -low blood counts, like low white cell, platelet, or red cell counts -lung or breathing disease, like asthma -radiation therapy -an unusual or allergic reaction to pemetrexed, other medicines, foods, dyes, or preservative -pregnant or trying to get pregnant -breast-feeding How  should I use this medicine? This drug is given as an infusion into a vein. It is administered in a hospital or clinic by a specially trained health care professional. Talk to your pediatrician regarding the use of this medicine in children. Special care may be needed. Overdosage: If you think you have taken too much of this medicine contact a poison control center or emergency room at once. NOTE: This medicine is only for you. Do not share this medicine with others. What if I miss a dose? It is important not to miss your dose. Call your doctor or health care professional if you are unable to keep an appointment. What may interact with this medicine? This medicine may interact with the following medications: -Ibuprofen This list may not describe all possible interactions. Give your health care provider a list of all the medicines, herbs, non-prescription drugs, or dietary supplements you use. Also tell them if you smoke, drink alcohol, or use illegal drugs. Some items may interact with your medicine. What should I watch for while using this medicine? Visit your doctor for checks on your progress. This drug may make you feel generally unwell. This is not uncommon, as chemotherapy can affect healthy cells as well as cancer cells. Report any side effects. Continue your course of treatment even though you feel ill unless your doctor tells you to stop. In some cases, you may be given additional medicines to help with side effects. Follow all directions for their use. Call your doctor or health care professional for advice if you get a fever, chills or sore throat, or other symptoms of a cold or flu. Do not treat yourself.  This drug decreases your body's ability to fight infections. Try to avoid being around people who are sick. This medicine may increase your risk to bruise or bleed. Call your doctor or health care professional if you notice any unusual bleeding. Be careful brushing and flossing your teeth  or using a toothpick because you may get an infection or bleed more easily. If you have any dental work done, tell your dentist you are receiving this medicine. Avoid taking products that contain aspirin, acetaminophen, ibuprofen, naproxen, or ketoprofen unless instructed by your doctor. These medicines may hide a fever. Call your doctor or health care professional if you get diarrhea or mouth sores. Do not treat yourself. To protect your kidneys, drink water or other fluids as directed while you are taking this medicine. Do not become pregnant while taking this medicine or for 6 months after stopping it. Women should inform their doctor if they wish to become pregnant or think they might be pregnant. Men should not father a child while taking this medicine and for 3 months after stopping it. This may interfere with the ability to father a child. You should talk to your doctor or health care professional if you are concerned about your fertility. There is a potential for serious side effects to an unborn child. Talk to your health care professional or pharmacist for more information. Do not breast-feed an infant while taking this medicine or for 1 week after stopping it. What side effects may I notice from receiving this medicine? Side effects that you should report to your doctor or health care professional as soon as possible: -allergic reactions like skin rash, itching or hives, swelling of the face, lips, or tongue -breathing problems -redness, blistering, peeling or loosening of the skin, including inside the mouth -signs and symptoms of bleeding such as bloody or black, tarry stools; red or dark-brown urine; spitting up blood or brown material that looks like coffee grounds; red spots on the skin; unusual bruising or bleeding from the eye, gums, or nose -signs and symptoms of infection like fever or chills; cough; sore throat; pain or trouble passing urine -signs and symptoms of kidney injury like  trouble passing urine or change in the amount of urine -signs and symptoms of liver injury like dark yellow or brown urine; general ill feeling or flu-like symptoms; light-colored stools; loss of appetite; nausea; right upper belly pain; unusually weak or tired; yellowing of the eyes or skin Side effects that usually do not require medical attention (report to your doctor or health care professional if they continue or are bothersome): -constipation -mouth sores -nausea, vomiting -unusually weak or tired This list may not describe all possible side effects. Call your doctor for medical advice about side effects. You may report side effects to FDA at 1-800-FDA-1088. Where should I keep my medicine? This drug is given in a hospital or clinic and will not be stored at home. NOTE: This sheet is a summary. It may not cover all possible information. If you have questions about this medicine, talk to your doctor, pharmacist, or health care provider.  2019 [employer] (2017-04-27 16:11:33)

## 2018-07-05 NOTE — Telephone Encounter (Signed)
Scheduled appt per 4/15 los.

## 2018-07-06 ENCOUNTER — Encounter: Payer: Self-pay | Admitting: Hematology

## 2018-07-07 ENCOUNTER — Encounter: Payer: Self-pay | Admitting: Hematology

## 2018-07-13 ENCOUNTER — Telehealth: Payer: Self-pay | Admitting: Hematology

## 2018-07-13 NOTE — Telephone Encounter (Signed)
Scheduled appt per 4/22 sch message - pt is aware of appt date and time for 5/4 and 5/6

## 2018-07-14 ENCOUNTER — Encounter: Payer: Self-pay | Admitting: Hematology

## 2018-07-17 ENCOUNTER — Encounter: Payer: Self-pay | Admitting: Hematology

## 2018-07-21 ENCOUNTER — Encounter: Payer: Self-pay | Admitting: Hematology

## 2018-07-21 ENCOUNTER — Telehealth: Payer: Self-pay | Admitting: Hematology

## 2018-07-21 ENCOUNTER — Ambulatory Visit (HOSPITAL_COMMUNITY)
Admission: RE | Admit: 2018-07-21 | Discharge: 2018-07-21 | Disposition: A | Discharge status: Home / Self Care | Payer: BLUE CROSS/BLUE SHIELD | Source: Ambulatory Visit | Attending: Hematology | Admitting: Hematology

## 2018-07-21 ENCOUNTER — Other Ambulatory Visit: Payer: Self-pay

## 2018-07-21 DIAGNOSIS — C3490 Malignant neoplasm of unspecified part of unspecified bronchus or lung: Secondary | ICD-10-CM | POA: Insufficient documentation

## 2018-07-21 MED ORDER — IOHEXOL 300 MG/ML  SOLN
100.0000 mL | Freq: Once | INTRAMUSCULAR | Status: AC | PRN
  Administered 2018-07-21: 100 mL via INTRAVENOUS

## 2018-07-21 NOTE — Progress Notes (Signed)
[place]   [phone] Fax:(336) [phone]   Clinic Follow up Note   Patient Care Team: Redmond School, MD as PCP - General (Internal Medicine)   I connected with Yvonne Lang on 07/24/2018 at 12:30 PM EDT by video enabled telemedicine visit and verified that I am speaking with the correct person using two identifiers.  I discussed the limitations, risks, security and privacy concerns of performing an evaluation and management service by telephone and the availability of in person appointments. I also discussed with the patient that there may be a patient responsible charge related to this service. The patient expressed understanding and agreed to proceed.   Other persons participating in the visit and their role in the encounter:  Pt's mother   Patient's location:  Home  Provider's location:  Office   CHIEF COMPLAINT: Metastatic non-small cell lung cancer  SUMMARY OF ONCOLOGIC HISTORY: Oncology History   Cancer Staging Metastatic non-small cell lung cancer ([place]) Staging form: Lung, AJCC 8th Edition - Clinical stage from 10/17/2017: Stage IV (cT3, cN1, cM1b) - Signed by Truitt Merle, MD on 10/17/2017       Metastatic non-small cell lung cancer ([place])   09/20/2017 Imaging    US Breast Left 09/20/17  IMPRESSION Indeterminte palpable mass measuring 3.2x1.7x2.8 cm  inferior medial to the inframammary fold of the left breast.      09/20/2017 Initial Biopsy    Diagnosis 09/20/17 Soft Tissue Needle Core Biopsy, inferior medial to IMF - ADENOCARCINOMA. - SEE MICROSCOPIC DESCRIPTION.  Microscopic Comment Immunohistochemistry will be performed and reported as an addendum. (JDP:ah 09/23/17) ADDENDUM: Immunohistochemistry shows the tumor is strongly positive with cytokeratin AE1/AE3, cytokeratin 7 and shows moderate weak positivity with CDX-2. The tumor is negative with estrogen receptor, progesterone receptor, GCDFP, GATA-3, Napsin A, TTF-1, WT-1 and cytokeratin  20. The immunophenotype is consistent with metastatic carcinoma. The combination of CDX-2 and cytokeratin 7 positivity raises the possibility of an upper gastrointestinal primary. Clinical correlation is essential.    10/06/2017 Initial Diagnosis    Metastatic adenocarcinoma involving soft tissue with unknown primary site [facility])    10/10/2017 Procedure    Upper Endoscopy by Dr. Silverio Decamp 10/10/17  IMPRESSION - Normal esophagus. - Z-line regular, 35 cm from the incisors. - Gastric bypass with a normal-sized pouch and intact staple line. Gastrojejunal anastomosis characterized by healthy appearing mucosa. - No specimens collected.    10/12/2017 Imaging    CT CAP WO Contrast 10/12/17  IMPRESSION: 7 cm central right lung mass involving the right hilum, with postobstructive collapse of the right middle lobe, highly suspicious for primary bronchogenic carcinoma. 5 cm masslike opacity in the superior segment of the right lower lobe may represent carcinoma or postobstructive pneumonitis. 3.3 cm soft tissue mass in the lower anterior chest wall soft tissues, suspicious for metastatic disease. No evidence of abdominal or pelvic metastatic disease.     10/14/2017 Imaging    MRI Brain 10/14/17  IMPRESSION: 1. No metastatic disease or acute intracranial abnormality. Normal MRI appearance of the brain. 2. 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      10/17/2017 Cancer Staging    Staging form: Lung, AJCC 8th Edition - Clinical stage from 10/17/2017: Stage IV (cT3, cN1, cM1b) - Signed by Truitt Merle, MD on 10/17/2017    10/24/2017 - 11/04/2017 Radiation Therapy     The Right lung mass was treated to 30 Gy in 10 fractions of 3 Gy    11/08/2017 -  Chemotherapy    -  first line chemo with carboplatin, paclitaxel, Atezolizumab and bevacizumab every 3weeks starting 11/08/17.  -Switched to maintenance therapy with avastin and atezolizumab q3weeks on 03/24/18    01/10/2018 Imaging    01/10/2018  CT CA IMPRESSION: 1. Response to therapy of central right lower lobe lung mass. Re-expansion of the right middle lobe with significantly improved right lower lobe postobstructive pneumonitis. Residual geographic airspace and ground-glass opacity within the medial right lung, primarily favored to be radiation induced. 2. Near complete resolution of presternal soft tissue mass. 3. New right adrenal nodule since 10/11/2017, suspicious for metastatic disease. 4. Right-sided Port-A-Cath with probable nonocclusive thrombus along the catheter within the right brachiocephalic vein. 5. Age advanced coronary artery atherosclerosis. Recommend assessment of coronary risk factors and consideration of medical therapy. 6. Aortic atherosclerosis (ICD10-I70.0) and emphysema (ICD10-J43.9).    03/10/2018 Imaging    CT CAP W Contrast 03/10/18 IMPRESSION: 1. Radiation changes involving the right paramediastinal lung and right hilum but no findings suspicious for residual or recurrent tumor. 2. No evidence of pulmonary metastatic disease. Stable emphysematous changes and areas of pulmonary scarring. 3. Stable 11 mm right adrenal gland nodule. 4. No findings for metastatic disease involving the liver or bony structures.    03/24/2018 - 04/24/2018 Chemotherapy    The patient had bevacizumab (AVASTIN) 1,300 mg in sodium chloride 0.9 % 100 mL chemo infusion, 15.5 mg/kg = 1,275 mg, Intravenous,  Once, 1 of 4 cycles Administration: 1,300 mg (03/24/2018) atezolizumab (TECENTRIQ) 1,200 mg in sodium chloride 0.9 % 250 mL chemo infusion, 1,200 mg, Intravenous, Once, 1 of 4 cycles Administration: 1,200 mg (03/24/2018)  for chemotherapy treatment.     04/11/2018 Imaging    CT ANGIO CHEST PE W OR WO CONTRAST  IMPRESSION: 1. No evidence of acute pulmonary embolism. 2. Progressive radiation changes in the right perihilar region. Cavitary retro hilar mass and right paratracheal lymph node have enlarged, suspicious for  local recurrence/disease progression. 3. Enlarging right adrenal nodule suspicious for metastatic disease. 4. New right pleural effusion with increased atelectasis at both lung bases. 5. These results will be called to the ordering clinician or representative by the Radiology Department at the imaging location.     04/21/2018 Imaging    NUCLEAR MEDICINE PET SKULL BASE TO THIGH IMPRESSION: 1. Marked hypermetabolism in the amorphous soft tissue involving the right hilum. This represents the central component of the apparent radiation scarring. 2. Hypermetabolic mediastinal nodal metastases with nodal metastases identified in the anterior right juxta diaphragmatic fat. 3. Hypermetabolic right adrenal metastasis. 4. Interval progression of loculated right pleural effusion.     05/02/2018 -  Chemotherapy    Second line chemo Alimta 500mg /m2 very 3 weeks     07/21/2018 Imaging    CT CAP 07/21/18  IMPRESSION: 1. Enlarging right adrenal metastatic lesion a mildly enlarging mediastinal adenopathy compatible with mild progression of disease. No new metastatic foci are identified. 2. Evolutionary findings along the right perihilar/paramediastinal radiation fibrosis including some improvement in aeration in the right upper lobe, but enlargement of the cavitary/centrally necrotic region in the superior segment right lower lobe, which is gas-filled and which probably connects to the otherwise focally occluded bronchus intermedius. 3. Other imaging findings of potential clinical significance: Probable left anterior descending coronary atherosclerosis. Mild to moderate intrahepatic biliary dilatation mild extrahepatic biliary dilatation, much of which may be a physiologic response to cholecystectomy. Prominent stool throughout the colon favors constipation. Aortic Atherosclerosis (ICD10-I70.0).    07/26/2018 -  Chemotherapy    The patient had  pegfilgrastim-cbqv (UDENYCA) injection 6 mg, 6 mg,  Subcutaneous, Once, 0 of 6 cycles DOCEtaxel (TAXOTERE) 140 mg in sodium chloride 0.9 % 250 mL chemo infusion, 75 mg/m2 = 140 mg, Intravenous,  Once, 0 of 6 cycles ramucirumab (CYRAMZA) 800 mg in sodium chloride 0.9 % 170 mL chemo infusion, 10 mg/kg = 800 mg, Intravenous, Once, 0 of 6 cycles  for chemotherapy treatment.       CURRENT THERAPY:  Second lineAlimta500mg /m2 every 3 weeks, started 05/02/2018  INTERVAL HISTORY:  Yvonne Lang is here for a follow up. The virtual visit was conducted through [employer]. She is clinically stable overall, she reports slightly worsening dyspnea on exertion.  She has been using nebulizer therapy more frequent, a few times a day.  Mild dry cough, no fever, sputum production, hemoptysis or other new complaints.  Appetite and energy level remains decent, she is able to function independently at home.  She is anxious to know the scan results.     REVIEW OF SYSTEMS:   Constitutional: Denies fevers, chills or abnormal weight loss, (+) fatigue  Eyes: Denies blurriness of vision Ears, nose, mouth, throat, and face: Denies mucositis or sore throat Respiratory: mild dry cough, (+)dyspnea on exertion and occasional wheezes Cardiovascular: Denies palpitation, chest discomfort or lower extremity swelling Gastrointestinal:  Denies nausea, heartburn or change in bowel habits Skin: Denies abnormal skin rashes Lymphatics: Denies new lymphadenopathy or easy bruising Neurological:Denies numbness, tingling or new weaknesses Behavioral/Psych: Mood is stable, no new changes  All other systems were reviewed with the patient and are negative.  MEDICAL HISTORY:  Past Medical History:  Diagnosis Date  . Anxiety   . Bronchitis   . COPD (chronic obstructive pulmonary disease) ([place])   . Depression   . GERD (gastroesophageal reflux disease)   . H/O gastric bypass    1999  . Irritable bowel syndrome   . Lung cancer [facility])     SURGICAL HISTORY: Past Surgical History:   Procedure Laterality Date  . BACK SURGERY    . CHOLECYSTECTOMY    . COLON SURGERY     for a kink in her colon and 12 inchs removed  . COLONOSCOPY N/A 12/13/2013   Procedure: COLONOSCOPY;  Surgeon: Rogene Houston, MD;  Location: [facility]ITE;  Service: Endoscopy;  Laterality: N/A;  200  . ESOPHAGOGASTRODUODENOSCOPY N/A 12/13/2013   Procedure: ESOPHAGOGASTRODUODENOSCOPY (EGD);  Surgeon: Rogene Houston, MD;  Location: [facility] SUITE;  Service: Endoscopy;  Laterality: N/A;  . Gastric Bypas  2000  . IR IMAGING GUIDED PORT INSERTION  11/14/2017    I have reviewed the social history and family history with the patient and they are unchanged from previous note.  ALLERGIES:  is allergic to codeine.  MEDICATIONS:  Current Outpatient Medications  Medication Sig Dispense Refill  . albuterol (PROVENTIL HFA) 108 (90 Base) MCG/ACT inhaler Inhale 2 puffs into the lungs 2 (two) times daily. 1 Inhaler 2  . albuterol (PROVENTIL) 4 MG tablet Take 4 mg by mouth 2 (two) times daily.     Marland Kitchen ALPRAZolam (XANAX) 0.25 MG tablet TAKE 1 TABLET BY MOUTH AT BEDTIME AS NEEDED FOR ANXIETY (Patient taking differently: Take 0.25 mg by mouth at bedtime as needed for sleep. ) 30 tablet 0  . ALPRAZolam (XANAX) 0.25 MG tablet Take 1 tablet (0.25 mg total) by mouth at bedtime as needed for anxiety or sleep. 30 tablet 0  . butalbital-acetaminophen-caffeine (FIORICET, ESGIC) 50-325-40 MG tablet Take 1-2 tablets by mouth every 6 (six) hours as  needed for headache. 60 tablet 1  . CALCIUM-MAGNESIUM-ZINC PO Take 1 tablet by mouth daily.    Marland Kitchen dicyclomine (BENTYL) 20 MG tablet Take 1 tablet (20 mg total) by mouth every 6 (six) hours. 60 tablet 1  . DULoxetine (CYMBALTA) 60 MG capsule Take 60 mg by mouth daily.    . furosemide (LASIX) 20 MG tablet Take 1 tablet (20 mg total) by mouth daily as needed. 30 tablet 0  . gabapentin (NEURONTIN) 300 MG capsule Take 300 mg by mouth daily as needed (for pain).   4  . HYDROcodone-acetaminophen  (NORCO) 10-325 MG tablet Take 2 tablets by mouth every 6 (six) hours as needed.    Marland Kitchen ipratropium-albuterol (DUONEB) 0.5-2.5 (3) MG/3ML SOLN Take 3 mLs by nebulization every 4 (four) hours as needed. (Patient taking differently: Take 3 mLs by nebulization every 4 (four) hours as needed (for shortness of breath). ) 360 mL 1  . lidocaine (XYLOCAINE) 2 % solution as directed.    . lidocaine-prilocaine (EMLA) cream Apply 1 application topically as needed. Apply to port one hour prior to port access/chemo 30 g 2  . magic mouthwash w/lidocaine SOLN Take 5 mLs by mouth 3 (three) times daily. Swish and spit 3 x daily 240 mL 0  . MOVANTIK 25 MG TABS tablet Take 1 tablet by mouth daily.    . Multiple Vitamin (MULTIVITAMIN) tablet Take 1 tablet by mouth daily.    Marland Kitchen omeprazole (PRILOSEC) 20 MG capsule Take 1 capsule (20 mg total) by mouth 2 (two) times daily before a meal. 60 capsule 0  . ondansetron (ZOFRAN) 8 MG tablet Take 1 tablet (8 mg total) by mouth every 8 (eight) hours as needed for nausea or vomiting. 30 tablet 2  . Oxycodone HCl 10 MG TABS Take 20 mg by mouth 3 (three) times daily.    . potassium chloride SA (K-DUR,KLOR-CON) 20 MEQ tablet Take 1 tablet (20 mEq total) by mouth daily. 60 tablet 0  . prochlorperazine (COMPAZINE) 10 MG tablet Take 1 tablet (10 mg total) by mouth every 6 (six) hours as needed for nausea or vomiting. 30 tablet 3  . rOPINIRole (REQUIP) 0.5 MG tablet Take 0.5 mg by mouth at bedtime.   11  . sucralfate (CARAFATE) 1 g tablet TAKE 1 TABLET(1 GRAM) BY MOUTH FOUR TIMES DAILY 120 tablet 0  . traMADol (ULTRAM) 50 MG tablet Take 1 tablet (50 mg total) by mouth every 6 (six) hours as needed. 90 tablet 0  . TRELEGY ELLIPTA 100-62.5-25 MCG/INH AEPB Inhale 1 puff into the lungs daily.   5  . zolpidem (AMBIEN) 10 MG tablet Take 1 tablet by mouth at bedtime.     No current facility-administered medications for this visit.     PHYSICAL EXAMINATION: ECOG PERFORMANCE STATUS: 2 -  Symptomatic, <50% confined to bed  No vitals taken today, Exam not performed today  LABORATORY DATA:  I have reviewed the data as listed CBC Latest Ref Rng & Units 07/05/2018 06/12/2018 05/23/2018  WBC 4.0 - 10.5 K/uL 9.3 11.9(H) 11.6(H)  Hemoglobin 12.0 - 15.0 g/dL 10.6(L) 10.8(L) 10.3(L)  Hematocrit 36.0 - 46.0 % 34.7(L) 35.1(L) 33.3(L)  Platelets 150 - 400 K/uL 621(H) 529(H) 527(H)     CMP Latest Ref Rng & Units 07/05/2018 06/12/2018 05/23/2018  Glucose 70 - 99 mg/dL 120(H) 102(H) 128(H)  BUN 6 - 20 mg/dL 8 6 6   Creatinine 0.44 - 1.00 mg/dL 0.64 0.58 0.61  Sodium 135 - 145 mmol/L 137 136 137  Potassium  3.5 - 5.1 mmol/L 3.7 5.2(H) 3.9  Chloride 98 - 111 mmol/L 100 101 101  CO2 22 - 32 mmol/L 24 25 27   Calcium 8.9 - 10.3 mg/dL 8.3(L) 8.3(L) 8.4(L)  Total Protein 6.5 - 8.1 g/dL 6.8 7.0 7.0  Total Bilirubin 0.3 - 1.2 mg/dL 0.2(L) 0.3 0.3  Alkaline Phos 38 - 126 U/L 548(H) 623(H) 576(H)  AST 15 - 41 U/L 33 73(H) 37  ALT 0 - 44 U/L 39 55(H) 43      RADIOGRAPHIC STUDIES: I have personally reviewed the radiological images as listed and agreed with the findings in the report. No results found.   ASSESSMENT & PLAN:  Yvonne Lang is a 53 y.o. female with   1. 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 -Diagnosed in 09/2017. Treated with chemo and radiation. She was initially onfirst linecarboplatin, paclitaxel, Atezolizumab and bevacizumab every 3weekss/p 6 cycles, with good partial response.Switched tomaintenance therapy withAvastinand Atezolizumabq3weeksstartedon 03/24/18. -Due to disease progression, chemoswitched tosecond line alimta 500mg /m2 every 3 weekson 05/02/2018, shehas been tolerating well -We discussed her CT CAP from 07/21/18 which shows enlarging right adrenal metastasis, and mildly enlarging No adenopathy, no other new metastasis. Previous radiation change in right lung and pleural effusion have resolved. I personally  reviewed the images with pt  -due to disease progression, I recommend changing treatment to third line docetaxel Cyramza every 3 weeks --Chemotherapy consent: Side effects including but does not not limited to, fatigue, nausea, vomiting, diarrhea, hair loss, neuropathy, fluid retention, renal and kidney dysfunction, neutropenic fever, needed for blood transfusion, bleeding, were discussed with patient in great detail. She agrees to proceed. -the goa of therapy is palliative  -plan to start later this week   2. Dyspnea and dry cough  -secondary to her COPD, right lung cancer and radiation changes  -overall stable, she will continue bronchodilator  3. Frontal Headaches -f/u with Dr. Mickeal Skinner -overall stableand improved   4. Anxiety and Depression  -Currently on Xanax and Cymbalta.Stable  5. Chronic back pain  -Currently on Tramadol 50 mg q6Hr.Stable  6. Low appetite, Nausea -Currently on Mirtazapine, omeprazole, ondansetron,prochlorperazine, and sucralfate. -Nutritional supplement  7. Hypokalemia  -OnKCL 35meq daily  8.Transaminitis -Probably related to chemotherapy, will continue monitoring.  9.Goal of care discussion  -The patient understands the goal of care is palliative. -We again discussed overall poor prognosis due to the disease progression, she has had multiple lines of chemo, the response from future treatment will be low  -we discussed DNR/DNI, she will think about it, she is still full code now   Plan -CT scan reviewed -due to disease progression, will change her chemo to docetaxel and Cyramza every 3 weeks, starting later this week, will hold udenyca for first cycle  -phone visit next week -lab, flush, f/u and chemo in 3 weeks    No problem-specific Assessment & Plan notes found for this encounter.   No orders of the defined types were placed in this encounter.  I discussed the assessment and treatment plan with the patient. The  patient was provided an opportunity to ask questions and all were answered. The patient agreed with the plan and demonstrated an understanding of the instructions.  The patient was advised to call back or seek an in-person evaluation if the symptoms worsen or if the condition fails to improve as anticipated.  I provided 25 minutes of face-to-face video visit time during this encounter, and > 50% was spent counseling as documented under my assessment &  plan.    Truitt Merle, MD 07/24/2018   I, Joslyn Devon, am acting as scribe for Truitt Merle, MD.   I have reviewed the above documentation for accuracy and completeness, and I agree with the above.

## 2018-07-21 NOTE — Telephone Encounter (Signed)
Left voicemail for patient regarding her [facility] appointment. I told her told download the [employer] App onto one of her devices. And sent her join link to . Told her to click the green "join meeting" button and that would populate the appointment details. Would also prompt her to download the app if she hadn't already.   Told her if she was unable to do a video call with Dr. Burr Medico, to call us back and we would convert it to a phone call visit.

## 2018-07-24 ENCOUNTER — Inpatient Hospital Stay: Payer: BLUE CROSS/BLUE SHIELD | Attending: Hematology | Admitting: Hematology

## 2018-07-24 ENCOUNTER — Other Ambulatory Visit: Payer: Self-pay | Admitting: Hematology

## 2018-07-24 ENCOUNTER — Telehealth: Payer: Self-pay

## 2018-07-24 DIAGNOSIS — Z5112 Encounter for antineoplastic immunotherapy: Secondary | ICD-10-CM | POA: Insufficient documentation

## 2018-07-24 DIAGNOSIS — Z923 Personal history of irradiation: Secondary | ICD-10-CM

## 2018-07-24 DIAGNOSIS — G8929 Other chronic pain: Secondary | ICD-10-CM | POA: Insufficient documentation

## 2018-07-24 DIAGNOSIS — Z7951 Long term (current) use of inhaled steroids: Secondary | ICD-10-CM | POA: Insufficient documentation

## 2018-07-24 DIAGNOSIS — C7970 Secondary malignant neoplasm of unspecified adrenal gland: Secondary | ICD-10-CM

## 2018-07-24 DIAGNOSIS — R51 Headache: Secondary | ICD-10-CM | POA: Insufficient documentation

## 2018-07-24 DIAGNOSIS — C7989 Secondary malignant neoplasm of other specified sites: Secondary | ICD-10-CM | POA: Diagnosis not present

## 2018-07-24 DIAGNOSIS — Z9221 Personal history of antineoplastic chemotherapy: Secondary | ICD-10-CM | POA: Insufficient documentation

## 2018-07-24 DIAGNOSIS — J440 Chronic obstructive pulmonary disease with acute lower respiratory infection: Secondary | ICD-10-CM | POA: Insufficient documentation

## 2018-07-24 DIAGNOSIS — R110 Nausea: Secondary | ICD-10-CM | POA: Insufficient documentation

## 2018-07-24 DIAGNOSIS — C3432 Malignant neoplasm of lower lobe, left bronchus or lung: Secondary | ICD-10-CM

## 2018-07-24 DIAGNOSIS — J449 Chronic obstructive pulmonary disease, unspecified: Secondary | ICD-10-CM

## 2018-07-24 DIAGNOSIS — R740 Nonspecific elevation of levels of transaminase and lactic acid dehydrogenase [LDH]: Secondary | ICD-10-CM | POA: Insufficient documentation

## 2018-07-24 DIAGNOSIS — Z9884 Bariatric surgery status: Secondary | ICD-10-CM | POA: Insufficient documentation

## 2018-07-24 DIAGNOSIS — E876 Hypokalemia: Secondary | ICD-10-CM | POA: Insufficient documentation

## 2018-07-24 DIAGNOSIS — C3490 Malignant neoplasm of unspecified part of unspecified bronchus or lung: Secondary | ICD-10-CM

## 2018-07-24 DIAGNOSIS — C7971 Secondary malignant neoplasm of right adrenal gland: Secondary | ICD-10-CM | POA: Insufficient documentation

## 2018-07-24 DIAGNOSIS — F329 Major depressive disorder, single episode, unspecified: Secondary | ICD-10-CM | POA: Insufficient documentation

## 2018-07-24 DIAGNOSIS — Z5111 Encounter for antineoplastic chemotherapy: Secondary | ICD-10-CM | POA: Insufficient documentation

## 2018-07-24 DIAGNOSIS — F418 Other specified anxiety disorders: Secondary | ICD-10-CM

## 2018-07-24 DIAGNOSIS — F419 Anxiety disorder, unspecified: Secondary | ICD-10-CM | POA: Insufficient documentation

## 2018-07-24 DIAGNOSIS — Z79899 Other long term (current) drug therapy: Secondary | ICD-10-CM | POA: Insufficient documentation

## 2018-07-24 DIAGNOSIS — C3431 Malignant neoplasm of lower lobe, right bronchus or lung: Secondary | ICD-10-CM | POA: Insufficient documentation

## 2018-07-24 DIAGNOSIS — K219 Gastro-esophageal reflux disease without esophagitis: Secondary | ICD-10-CM | POA: Insufficient documentation

## 2018-07-24 NOTE — Telephone Encounter (Signed)
Left voice message per Dr. Burr Medico to let her know her new chemotherapy regimen has been approved, instructed to keep her scheduled appointments on 5/6 and to take her steroids tomorrow.    Asked her to call back if she has any questions regarding this message.

## 2018-07-24 NOTE — Progress Notes (Signed)
DISCONTINUE ON PATHWAY REGIMEN - Non-Small Cell Lung     A cycle is every 21 days:     Pemetrexed   **Always confirm dose/schedule in your pharmacy ordering system**  REASON: Disease Progression PRIOR TREATMENT: [id number]: Pemetrexed 500 mg/m2 q21 Days Until Progression or Unacceptable Toxicity TREATMENT RESPONSE: Progressive Disease (PD)  START ON PATHWAY REGIMEN - Non-Small Cell Lung     A cycle is every 21 days:     Ramucirumab      Docetaxel   **Always confirm dose/schedule in your pharmacy ordering system**  Patient Characteristics: Stage IV Metastatic, Nonsquamous, Third Line - Chemotherapy/Immunotherapy, PS = 0, 1, Prior PD-1/PD-L1 Inhibitor or No Prior PD-1/PD-L1 Inhibitor and Not a Candidate for Immunotherapy AJCC T Category: T3 Current Disease Status: Distant Metastases AJCC N Category: N1 AJCC M Category: M1b AJCC 8 Stage Grouping: IVA Histology: Nonsquamous Cell ROS1 Rearrangement Status: Negative T790M Mutation Status: Not Applicable - EGFR Mutation Negative/Unknown Other Mutations/Biomarkers: No Other Actionable Mutations NTRK Gene Fusion Status: Negative PD-L1 Expression Status: PD-L1 Negative Chemotherapy/Immunotherapy LOT: Third Line Chemotherapy/Immunotherapy Molecular Targeted Therapy: Not Appropriate ALK Rearrangement Status: Negative EGFR Mutation Status: Negative/Wild Type BRAF V600E Mutation Status: Negative ECOG Performance Status: 1 Immunotherapy Candidate Status: Not a Candidate for Immunotherapy Prior Immunotherapy Status: Prior PD-1/PD-L1 Inhibitor Intent of Therapy: Non-Curative / Palliative Intent, Discussed with Patient

## 2018-07-25 ENCOUNTER — Encounter: Payer: Self-pay | Admitting: Hematology

## 2018-07-25 ENCOUNTER — Telehealth: Payer: Self-pay | Admitting: Hematology

## 2018-07-25 ENCOUNTER — Other Ambulatory Visit: Payer: Self-pay | Admitting: Hematology

## 2018-07-25 NOTE — Telephone Encounter (Signed)
No los per 5/4.

## 2018-07-26 ENCOUNTER — Inpatient Hospital Stay: Payer: BLUE CROSS/BLUE SHIELD

## 2018-07-26 ENCOUNTER — Other Ambulatory Visit: Payer: Self-pay

## 2018-07-26 ENCOUNTER — Ambulatory Visit: Payer: 59 | Admitting: Nurse Practitioner

## 2018-07-26 VITALS — BP 111/91 | HR 85 | Temp 98.6°F | Resp 17 | Wt 170.0 lb

## 2018-07-26 DIAGNOSIS — Z5112 Encounter for antineoplastic immunotherapy: Secondary | ICD-10-CM | POA: Diagnosis not present

## 2018-07-26 DIAGNOSIS — F419 Anxiety disorder, unspecified: Secondary | ICD-10-CM | POA: Diagnosis not present

## 2018-07-26 DIAGNOSIS — C7971 Secondary malignant neoplasm of right adrenal gland: Secondary | ICD-10-CM | POA: Diagnosis not present

## 2018-07-26 DIAGNOSIS — Z95828 Presence of other vascular implants and grafts: Secondary | ICD-10-CM

## 2018-07-26 DIAGNOSIS — Z9221 Personal history of antineoplastic chemotherapy: Secondary | ICD-10-CM | POA: Diagnosis not present

## 2018-07-26 DIAGNOSIS — E876 Hypokalemia: Secondary | ICD-10-CM | POA: Diagnosis not present

## 2018-07-26 DIAGNOSIS — Z79899 Other long term (current) drug therapy: Secondary | ICD-10-CM | POA: Diagnosis not present

## 2018-07-26 DIAGNOSIS — C3490 Malignant neoplasm of unspecified part of unspecified bronchus or lung: Secondary | ICD-10-CM

## 2018-07-26 DIAGNOSIS — J440 Chronic obstructive pulmonary disease with acute lower respiratory infection: Secondary | ICD-10-CM | POA: Diagnosis not present

## 2018-07-26 DIAGNOSIS — F329 Major depressive disorder, single episode, unspecified: Secondary | ICD-10-CM | POA: Diagnosis not present

## 2018-07-26 DIAGNOSIS — R110 Nausea: Secondary | ICD-10-CM | POA: Diagnosis not present

## 2018-07-26 DIAGNOSIS — Z7951 Long term (current) use of inhaled steroids: Secondary | ICD-10-CM | POA: Diagnosis not present

## 2018-07-26 DIAGNOSIS — Z9884 Bariatric surgery status: Secondary | ICD-10-CM | POA: Diagnosis not present

## 2018-07-26 DIAGNOSIS — G8929 Other chronic pain: Secondary | ICD-10-CM | POA: Diagnosis not present

## 2018-07-26 DIAGNOSIS — R740 Nonspecific elevation of levels of transaminase and lactic acid dehydrogenase [LDH]: Secondary | ICD-10-CM | POA: Diagnosis not present

## 2018-07-26 DIAGNOSIS — Z923 Personal history of irradiation: Secondary | ICD-10-CM | POA: Diagnosis not present

## 2018-07-26 DIAGNOSIS — R51 Headache: Secondary | ICD-10-CM | POA: Diagnosis not present

## 2018-07-26 DIAGNOSIS — D630 Anemia in neoplastic disease: Secondary | ICD-10-CM

## 2018-07-26 DIAGNOSIS — K219 Gastro-esophageal reflux disease without esophagitis: Secondary | ICD-10-CM | POA: Diagnosis not present

## 2018-07-26 DIAGNOSIS — C3431 Malignant neoplasm of lower lobe, right bronchus or lung: Secondary | ICD-10-CM | POA: Diagnosis not present

## 2018-07-26 DIAGNOSIS — Z5111 Encounter for antineoplastic chemotherapy: Secondary | ICD-10-CM | POA: Diagnosis not present

## 2018-07-26 LAB — CBC WITH DIFFERENTIAL (CANCER CENTER ONLY)
Abs Immature Granulocytes: 0.05 10*3/uL (ref 0.00–0.07)
Basophils Absolute: 0 10*3/uL (ref 0.0–0.1)
Basophils Relative: 0 %
Eosinophils Absolute: 0 10*3/uL (ref 0.0–0.5)
Eosinophils Relative: 0 %
HCT: 34.9 % — ABNORMAL LOW (ref 36.0–46.0)
Hemoglobin: 10.8 g/dL — ABNORMAL LOW (ref 12.0–15.0)
Immature Granulocytes: 1 %
Lymphocytes Relative: 9 %
Lymphs Abs: 0.9 10*3/uL (ref 0.7–4.0)
MCH: 29.3 pg (ref 26.0–34.0)
MCHC: 30.9 g/dL (ref 30.0–36.0)
MCV: 94.8 fL (ref 80.0–100.0)
Monocytes Absolute: 0.8 10*3/uL (ref 0.1–1.0)
Monocytes Relative: 9 %
Neutro Abs: 7.6 10*3/uL (ref 1.7–7.7)
Neutrophils Relative %: 81 %
Platelet Count: 466 10*3/uL — ABNORMAL HIGH (ref 150–400)
RBC: 3.68 MIL/uL — ABNORMAL LOW (ref 3.87–5.11)
RDW: 17.3 % — ABNORMAL HIGH (ref 11.5–15.5)
WBC Count: 9.4 10*3/uL (ref 4.0–10.5)
nRBC: 0 % (ref 0.0–0.2)

## 2018-07-26 LAB — CMP (CANCER CENTER ONLY)
ALT: 66 U/L — ABNORMAL HIGH (ref 0–44)
AST: 32 U/L (ref 15–41)
Albumin: 2.8 g/dL — ABNORMAL LOW (ref 3.5–5.0)
Alkaline Phosphatase: 394 U/L — ABNORMAL HIGH (ref 38–126)
Anion gap: 9 (ref 5–15)
BUN: 7 mg/dL (ref 6–20)
CO2: 27 mmol/L (ref 22–32)
Calcium: 8.4 mg/dL — ABNORMAL LOW (ref 8.9–10.3)
Chloride: 100 mmol/L (ref 98–111)
Creatinine: 0.7 mg/dL (ref 0.44–1.00)
GFR, Est AFR Am: >60 mL/min (ref >60)
GFR, Estimated: >60 mL/min (ref >60)
Glucose, Bld: 102 mg/dL — ABNORMAL HIGH (ref 70–99)
Potassium: 3.8 mmol/L (ref 3.5–5.1)
Sodium: 136 mmol/L (ref 135–145)
Total Bilirubin: 0.3 mg/dL (ref 0.3–1.2)
Total Protein: 6.1 g/dL — ABNORMAL LOW (ref 6.5–8.1)

## 2018-07-26 LAB — FERRITIN: Ferritin: 77 ng/mL (ref 11–307)

## 2018-07-26 MED ORDER — SODIUM CHLORIDE 0.9 % IV SOLN
10.0000 mg/kg | Freq: Once | INTRAVENOUS | Status: AC
  Administered 2018-07-26: 800 mg via INTRAVENOUS
  Filled 2018-07-26: qty 50

## 2018-07-26 MED ORDER — DEXAMETHASONE SODIUM PHOSPHATE 10 MG/ML IJ SOLN
10.0000 mg | Freq: Once | INTRAMUSCULAR | Status: AC
  Administered 2018-07-26: 10 mg via INTRAVENOUS

## 2018-07-26 MED ORDER — ACETAMINOPHEN 325 MG PO TABS
ORAL_TABLET | ORAL | Status: AC
  Filled 2018-07-26: qty 2

## 2018-07-26 MED ORDER — HEPARIN SOD (PORK) LOCK FLUSH 100 UNIT/ML IV SOLN
500.0000 [IU] | Freq: Once | INTRAVENOUS | Status: DC | PRN
  Filled 2018-07-26: qty 5

## 2018-07-26 MED ORDER — SODIUM CHLORIDE 0.9% FLUSH
10.0000 mL | INTRAVENOUS | Status: DC | PRN
  Administered 2018-07-26: 10 mL
  Filled 2018-07-26: qty 10

## 2018-07-26 MED ORDER — DIPHENHYDRAMINE HCL 50 MG/ML IJ SOLN
INTRAMUSCULAR | Status: AC
  Filled 2018-07-26: qty 1

## 2018-07-26 MED ORDER — SODIUM CHLORIDE 0.9 % IV SOLN
Freq: Once | INTRAVENOUS | Status: AC
  Administered 2018-07-26: 14:00:00 via INTRAVENOUS
  Filled 2018-07-26: qty 250

## 2018-07-26 MED ORDER — SODIUM CHLORIDE 0.9 % IV SOLN
75.0000 mg/m2 | Freq: Once | INTRAVENOUS | Status: AC
  Administered 2018-07-26: 16:00:00 140 mg via INTRAVENOUS
  Filled 2018-07-26: qty 14

## 2018-07-26 MED ORDER — DIPHENHYDRAMINE HCL 50 MG/ML IJ SOLN
50.0000 mg | Freq: Once | INTRAMUSCULAR | Status: AC
  Administered 2018-07-26: 50 mg via INTRAVENOUS

## 2018-07-26 MED ORDER — ACETAMINOPHEN 325 MG PO TABS
650.0000 mg | ORAL_TABLET | Freq: Once | ORAL | Status: AC
  Administered 2018-07-26: 14:00:00 650 mg via ORAL

## 2018-07-26 MED ORDER — SODIUM CHLORIDE 0.9% FLUSH
10.0000 mL | INTRAVENOUS | Status: DC | PRN
  Filled 2018-07-26: qty 10

## 2018-07-26 MED ORDER — DEXAMETHASONE SODIUM PHOSPHATE 10 MG/ML IJ SOLN
INTRAMUSCULAR | Status: AC
  Filled 2018-07-26: qty 1

## 2018-07-26 NOTE — Patient Instructions (Signed)
[facility] Discharge Instructions for Patients Receiving Chemotherapy  Today you received the following chemotherapy agents: ramucirumab (Cyramza) and docetaxel (Taxotere)  To help prevent nausea and vomiting after your treatment, we encourage you to take your nausea medication as directed.   If you develop nausea and vomiting that is not controlled by your nausea medication, call the clinic.   BELOW ARE SYMPTOMS THAT SHOULD BE REPORTED IMMEDIATELY:  *FEVER GREATER THAN 100.5 F  *CHILLS WITH OR WITHOUT FEVER  NAUSEA AND VOMITING THAT IS NOT CONTROLLED WITH YOUR NAUSEA MEDICATION  *UNUSUAL SHORTNESS OF BREATH  *UNUSUAL BRUISING OR BLEEDING  TENDERNESS IN MOUTH AND THROAT WITH OR WITHOUT PRESENCE OF ULCERS  *URINARY PROBLEMS  *BOWEL PROBLEMS  UNUSUAL RASH Items with * indicate a potential emergency and should be followed up as soon as possible.  Feel free to call the clinic should you have any questions or concerns. The clinic phone number is [phone].  Please show the [facility] at check-in to the Emergency Department and triage nurse.  Ramucirumab injection What is this medicine? RAMUCIRUMAB (ra mue SIR ue mab) is a monoclonal antibody. It is used to treat stomach cancer, colorectal cancer, liver cancer, and lung cancer. This medicine may be used for other purposes; ask your health care provider or pharmacist if you have questions. COMMON BRAND NAME(S): Cyramza What should I tell my health care provider before I take this medicine? They need to know if you have any of these conditions: -bleeding disorders -blood clots -heart disease, including heart failure, heart attack, or chest pain (angina) -high blood pressure -infection (especially a virus infection such as chickenpox, cold sores, or herpes) -protein in your urine -recent surgery -stroke -an unusual or allergic reaction to ramucirumab, other medicines, foods, dyes, or  preservatives -pregnant or trying to get pregnant -breast-feeding How should I use this medicine? This medicine is for infusion into a vein. It is given by a health care professional in a hospital or clinic setting. Talk to your pediatrician regarding the use of this medicine in children. Special care may be needed. Overdosage: If you think you have taken too much of this medicine contact a poison control center or emergency room at once. NOTE: This medicine is only for you. Do not share this medicine with others. What if I miss a dose? It is important not to miss your dose. Call your doctor or health care professional if you are unable to keep an appointment. What may interact with this medicine? Interactions have not been studied. This list may not describe all possible interactions. Give your health care provider a list of all the medicines, herbs, non-prescription drugs, or dietary supplements you use. Also tell them if you smoke, drink alcohol, or use illegal drugs. Some items may interact with your medicine. What should I watch for while using this medicine? Your condition will be monitored carefully while you are receiving this medicine. You will need to to check your blood pressure and have your blood and urine tested while you are taking this medicine. Your condition will be monitored carefully while you are receiving this medicine. This medicine may increase your risk to bruise or bleed. Call your doctor or health care professional if you notice any unusual bleeding. This medicine may rarely cause 'gastrointestinal perforation' (holes in the stomach, intestines or colon), a serious side effect requiring surgery to repair. This medicine should be started at least 28 days following major surgery and the site of the  surgery should be totally healed. Check with your doctor before scheduling dental work or surgery while you are receiving this treatment. Talk to your doctor if you have recently  had surgery or if you have a wound that has not healed. Do not become pregnant while taking this medicine or for 3 months after stopping it. Women should inform their doctor if they wish to become pregnant or think they might be pregnant. There is a potential for serious side effects to an unborn child. Talk to your health care professional or pharmacist for more information. Do not breast-feed an infant while taking this medicine or for 2 months after stopping it. This medicine may interfere with the ability to have a child. Talk with your doctor or health care professional if you are concerned about your fertility. What side effects may I notice from receiving this medicine? Side effects that you should report to your doctor or health care professional as soon as possible: -allergic reactions like skin rash, itching or hives, breathing problems, swelling of the face, lips, or tongue -signs of infection - fever or chills, cough, sore throat -chest pain or chest tightness -confusion -dizziness -feeling faint or lightheaded, falls -severe abdominal pain -severe nausea, vomiting -signs and symptoms of bleeding such as bloody or black, tarry stools; red or dark-brown urine; spitting up blood or brown material that looks like coffee grounds; red spots on the skin; unusual bruising or bleeding from the eye, gums, or nose -signs and symptoms of a blood clot such as breathing problems; changes in vision; chest pain; severe, sudden headache; pain, swelling, warmth in the leg; trouble speaking; sudden numbness or weakness of the face, arm or leg -symptoms of a stroke: change in mental awareness, inability to talk or move one side of the body -trouble walking, dizziness, loss of balance or coordination Side effects that usually do not require medical attention (report to your doctor or health care professional if they continue or are bothersome): -cold, clammy  skin -constipation -diarrhea -headache -nausea, vomiting -stomach pain -unusually slow heartbeat -unusually weak or tired This list may not describe all possible side effects. Call your doctor for medical advice about side effects. You may report side effects to FDA at 1-800-FDA-1088. Where should I keep my medicine? This drug is given in a hospital or clinic and will not be stored at home. NOTE: This sheet is a summary. It may not cover all possible information. If you have questions about this medicine, talk to your doctor, pharmacist, or health care provider.  2019 Elsevier/Gold Standard (2017-08-17 11:25:23)  Docetaxel injection What is this medicine? DOCETAXEL (doe se TAX el) is a chemotherapy drug. It targets fast dividing cells, like cancer cells, and causes these cells to die. This medicine is used to treat many types of cancers like breast cancer, certain stomach cancers, head and neck cancer, lung cancer, and prostate cancer. This medicine may be used for other purposes; ask your health care provider or pharmacist if you have questions. COMMON BRAND NAME(S): Docefrez, Taxotere What should I tell my health care provider before I take this medicine? They need to know if you have any of these conditions: -infection (especially a virus infection such as chickenpox, cold sores, or herpes) -liver disease -low blood counts, like low white cell, platelet, or red cell counts -an unusual or allergic reaction to docetaxel, polysorbate 80, other chemotherapy agents, other medicines, foods, dyes, or preservatives -pregnant or trying to get pregnant -breast-feeding How should I use this  medicine? This drug is given as an infusion into a vein. It is administered in a hospital or clinic by a specially trained health care professional. Talk to your pediatrician regarding the use of this medicine in children. Special care may be needed. Overdosage: If you think you have taken too much of this  medicine contact a poison control center or emergency room at once. NOTE: This medicine is only for you. Do not share this medicine with others. What if I miss a dose? It is important not to miss your dose. Call your doctor or health care professional if you are unable to keep an appointment. What may interact with this medicine? -cyclosporine -erythromycin -ketoconazole -medicines to increase blood counts like filgrastim, pegfilgrastim, sargramostim -vaccines Talk to your doctor or health care professional before taking any of these medicines: -acetaminophen -aspirin -ibuprofen -ketoprofen -naproxen This list may not describe all possible interactions. Give your health care provider a list of all the medicines, herbs, non-prescription drugs, or dietary supplements you use. Also tell them if you smoke, drink alcohol, or use illegal drugs. Some items may interact with your medicine. What should I watch for while using this medicine? Your condition will be monitored carefully while you are receiving this medicine. You will need important blood work done while you are taking this medicine. This drug may make you feel generally unwell. This is not uncommon, as chemotherapy can affect healthy cells as well as cancer cells. Report any side effects. Continue your course of treatment even though you feel ill unless your doctor tells you to stop. In some cases, you may be given additional medicines to help with side effects. Follow all directions for their use. Call your doctor or health care professional for advice if you get a fever, chills or sore throat, or other symptoms of a cold or flu. Do not treat yourself. This drug decreases your body's ability to fight infections. Try to avoid being around people who are sick. This medicine may increase your risk to bruise or bleed. Call your doctor or health care professional if you notice any unusual bleeding. This medicine may contain alcohol in the  product. You may get drowsy or dizzy. Do not drive, use machinery, or do anything that needs mental alertness until you know how this medicine affects you. Do not stand or sit up quickly, especially if you are an older patient. This reduces the risk of dizzy or fainting spells. Avoid alcoholic drinks. Do not become pregnant while taking this medicine or for 6 months after stopping it. Women should inform their doctor if they wish to become pregnant or think they might be pregnant. Men should not father a child while taking this medicine and for 3 months after stopping it. There is a potential for serious side effects to an unborn child. Talk to your health care professional or pharmacist for more information. Do not breast-feed an infant while taking this medicine or for 2 weeks after stopping it. This may interfere with the ability to father a child. You should talk to your doctor or health care professional if you are concerned about your fertility. What side effects may I notice from receiving this medicine? Side effects that you should report to your doctor or health care professional as soon as possible: -allergic reactions like skin rash, itching or hives, swelling of the face, lips, or tongue -low blood counts - This drug may decrease the number of white blood cells, red blood cells and platelets. You  may be at increased risk for infections and bleeding. -signs of infection - fever or chills, cough, sore throat, pain or difficulty passing urine -signs of decreased platelets or bleeding - bruising, pinpoint red spots on the skin, black, tarry stools, nosebleeds -signs of decreased red blood cells - unusually weak or tired, fainting spells, lightheadedness -breathing problems -fast or irregular heartbeat -low blood pressure -mouth sores -nausea and vomiting -pain, swelling, redness or irritation at the injection site -pain, tingling, numbness in the hands or feet -swelling of the ankle, feet,  hands -weight gain Side effects that usually do not require medical attention (report to your doctor or health care professional if they continue or are bothersome): -bone pain -complete hair loss including hair on your head, underarms, pubic hair, eyebrows, and eyelashes -diarrhea -excessive tearing -changes in the color of fingernails -loosening of the fingernails -nausea -muscle pain -red flush to skin -sweating -weak or tired This list may not describe all possible side effects. Call your doctor for medical advice about side effects. You may report side effects to FDA at 1-800-FDA-1088. Where should I keep my medicine? This drug is given in a hospital or clinic and will not be stored at home. NOTE: This sheet is a summary. It may not cover all possible information. If you have questions about this medicine, talk to your doctor, pharmacist, or health care provider.  2019 Elsevier/Gold Standard (2017-04-04 12:07:21)

## 2018-07-27 ENCOUNTER — Telehealth: Payer: Self-pay | Admitting: *Deleted

## 2018-07-27 NOTE — Telephone Encounter (Signed)
-----   Message from Teodoro Spray, RN sent at 07/26/2018  5:34 PM EDT ----- Regarding: Dr. Arman Filter time chemo followup Dr. Burr Medico patient--first time cyramza, taxotere. Pt tolerated well.  Thank you!

## 2018-07-27 NOTE — Telephone Encounter (Signed)
TCT patient to follow up with her after her 1st chemo treatment of cyramza and taxotere. Spoke with patient. She is experiencing a little bit of nausea but not much. She is taking anti-emetic as ordered.  Eating and drinking fluids well. Denies fever, chills etc. She voices understanding to call back with questions or concerns. She is waiting to hear from schedulers about upcoming appts.

## 2018-07-31 ENCOUNTER — Encounter: Payer: Self-pay | Admitting: Hematology

## 2018-08-02 ENCOUNTER — Other Ambulatory Visit: Payer: Self-pay | Admitting: Nurse Practitioner

## 2018-08-02 ENCOUNTER — Other Ambulatory Visit: Payer: Self-pay | Admitting: Hematology

## 2018-08-02 DIAGNOSIS — Z6824 Body mass index (BMI) 24.0-24.9, adult: Secondary | ICD-10-CM | POA: Diagnosis not present

## 2018-08-02 DIAGNOSIS — E063 Autoimmune thyroiditis: Secondary | ICD-10-CM | POA: Diagnosis not present

## 2018-08-02 DIAGNOSIS — F419 Anxiety disorder, unspecified: Secondary | ICD-10-CM | POA: Diagnosis not present

## 2018-08-02 DIAGNOSIS — C3490 Malignant neoplasm of unspecified part of unspecified bronchus or lung: Secondary | ICD-10-CM | POA: Diagnosis not present

## 2018-08-02 DIAGNOSIS — Z1389 Encounter for screening for other disorder: Secondary | ICD-10-CM | POA: Diagnosis not present

## 2018-08-02 DIAGNOSIS — G894 Chronic pain syndrome: Secondary | ICD-10-CM | POA: Diagnosis not present

## 2018-08-02 MED ORDER — ALPRAZOLAM 0.25 MG PO TABS: 0.2500 mg | ORAL_TABLET | Freq: Every evening | ORAL | 0 refills | Status: AC | PRN

## 2018-08-02 MED ORDER — ONDANSETRON HCL 8 MG PO TABS: 8.0000 mg | ORAL_TABLET | Freq: Three times a day (TID) | ORAL | 2 refills | Status: DC | PRN

## 2018-08-02 NOTE — Telephone Encounter (Signed)
Pt requesting refill.  Thanks, Drucie Ip, RN

## 2018-08-15 NOTE — Addendum Note (Signed)
Addended by: Neysa Hotter on: 08/15/2018 11:55 AM   Modules accepted: Orders

## 2018-08-16 ENCOUNTER — Encounter: Payer: Self-pay | Admitting: Nurse Practitioner

## 2018-08-16 NOTE — Progress Notes (Signed)
Yvonne Lang   [phone]) [phone] [phone]   Clinic Follow up Note   Patient Care Team: Redmond School, MD as PCP - General (Internal Medicine) 08/17/2018  CHIEF COMPLAINT: f/u metastatic NSCLC  SUMMARY OF ONCOLOGIC HISTORY: Oncology History   Cancer Staging Metastatic non-small cell lung cancer [facility]) Staging form: Lung, AJCC 8th Edition - Clinical stage from 10/17/2017: Stage IV (cT3, cN1, cM1b) - Signed by Truitt Merle, MD on 10/17/2017       Metastatic non-small cell lung cancer ([place])   09/20/2017 Imaging    US Breast Left 09/20/17  IMPRESSION Indeterminte palpable mass measuring 3.2x1.7x2.8 cm  inferior medial to the inframammary fold of the left breast.      09/20/2017 Initial Biopsy    Diagnosis 09/20/17 Soft Tissue Needle Core Biopsy, inferior medial to IMF - ADENOCARCINOMA. - SEE MICROSCOPIC DESCRIPTION.  Microscopic Comment Immunohistochemistry will be performed and reported as an addendum. (JDP:ah 09/23/17) ADDENDUM: Immunohistochemistry shows the tumor is strongly positive with cytokeratin AE1/AE3, cytokeratin 7 and shows moderate weak positivity with CDX-2. The tumor is negative with estrogen receptor, progesterone receptor, GCDFP, GATA-3, Napsin A, TTF-1, WT-1 and cytokeratin 20. The immunophenotype is consistent with metastatic carcinoma. The combination of CDX-2 and cytokeratin 7 positivity raises the possibility of an upper gastrointestinal primary. Clinical correlation is essential.    10/06/2017 Initial Diagnosis    Metastatic adenocarcinoma involving soft tissue with unknown primary site [facility])    10/10/2017 Procedure    Upper Endoscopy by Dr. Silverio Decamp 10/10/17  IMPRESSION - Normal esophagus. - Z-line regular, 35 cm from the incisors. - Gastric bypass with a normal-sized pouch and intact staple line. Gastrojejunal anastomosis characterized by healthy appearing mucosa. - No specimens collected.    10/12/2017 Imaging    CT CAP WO  Contrast 10/12/17  IMPRESSION: 7 cm central right lung mass involving the right hilum, with postobstructive collapse of the right middle lobe, highly suspicious for primary bronchogenic carcinoma. 5 cm masslike opacity in the superior segment of the right lower lobe may represent carcinoma or postobstructive pneumonitis. 3.3 cm soft tissue mass in the lower anterior chest wall soft tissues, suspicious for metastatic disease. No evidence of abdominal or pelvic metastatic disease.     10/14/2017 Imaging    MRI Brain 10/14/17  IMPRESSION: 1. No metastatic disease or acute intracranial abnormality. Normal MRI appearance of the brain. 2. 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      10/17/2017 Cancer Staging    Staging form: Lung, AJCC 8th Edition - Clinical stage from 10/17/2017: Stage IV (cT3, cN1, cM1b) - Signed by Truitt Merle, MD on 10/17/2017    10/24/2017 - 11/04/2017 Radiation Therapy     The Right lung mass was treated to 30 Gy in 10 fractions of 3 Gy    11/08/2017 -  Chemotherapy    -first line chemo with carboplatin, paclitaxel, Atezolizumab and bevacizumab every 3weeks starting 11/08/17.  -Switched to maintenance therapy with avastin and atezolizumab q3weeks on 03/24/18    01/10/2018 Imaging    01/10/2018 CT CA IMPRESSION: 1. Response to therapy of central right lower lobe lung mass. Re-expansion of the right middle lobe with significantly improved right lower lobe postobstructive pneumonitis. Residual geographic airspace and ground-glass opacity within the medial right lung, primarily favored to be radiation induced. 2. Near complete resolution of presternal soft tissue mass. 3. New right adrenal nodule since 10/11/2017, suspicious for metastatic disease. 4. Right-sided Port-A-Cath with probable nonocclusive thrombus along the catheter within the right brachiocephalic  vein. 5. Age advanced coronary artery atherosclerosis. Recommend assessment of coronary risk  factors and consideration of medical therapy. 6. Aortic atherosclerosis (ICD10-I70.0) and emphysema (ICD10-J43.9).    03/10/2018 Imaging    CT CAP W Contrast 03/10/18 IMPRESSION: 1. Radiation changes involving the right paramediastinal lung and right hilum but no findings suspicious for residual or recurrent tumor. 2. No evidence of pulmonary metastatic disease. Stable emphysematous changes and areas of pulmonary scarring. 3. Stable 11 mm right adrenal gland nodule. 4. No findings for metastatic disease involving the liver or bony structures.    03/24/2018 - 04/24/2018 Chemotherapy    The patient had bevacizumab (AVASTIN) 1,300 mg in sodium chloride 0.9 % 100 mL chemo infusion, 15.5 mg/kg = 1,275 mg, Intravenous,  Once, 1 of 4 cycles Administration: 1,300 mg (03/24/2018) atezolizumab (TECENTRIQ) 1,200 mg in sodium chloride 0.9 % 250 mL chemo infusion, 1,200 mg, Intravenous, Once, 1 of 4 cycles Administration: 1,200 mg (03/24/2018)  for chemotherapy treatment.     04/11/2018 Imaging    CT ANGIO CHEST PE W OR WO CONTRAST  IMPRESSION: 1. No evidence of acute pulmonary embolism. 2. Progressive radiation changes in the right perihilar region. Cavitary retro hilar mass and right paratracheal lymph node have enlarged, suspicious for local recurrence/disease progression. 3. Enlarging right adrenal nodule suspicious for metastatic disease. 4. New right pleural effusion with increased atelectasis at both lung bases. 5. These results will be called to the ordering clinician or representative by the Radiology Department at the imaging location.     04/21/2018 Imaging    NUCLEAR MEDICINE PET SKULL BASE TO THIGH IMPRESSION: 1. Marked hypermetabolism in the amorphous soft tissue involving the right hilum. This represents the central component of the apparent radiation scarring. 2. Hypermetabolic mediastinal nodal metastases with nodal metastases identified in the anterior right juxta diaphragmatic  fat. 3. Hypermetabolic right adrenal metastasis. 4. Interval progression of loculated right pleural effusion.     05/02/2018 -  Chemotherapy    Second line chemo Alimta 500mg /m2 very 3 weeks     07/21/2018 Imaging    CT CAP 07/21/18  IMPRESSION: 1. Enlarging right adrenal metastatic lesion a mildly enlarging mediastinal adenopathy compatible with mild progression of disease. No new metastatic foci are identified. 2. Evolutionary findings along the right perihilar/paramediastinal radiation fibrosis including some improvement in aeration in the right upper lobe, but enlargement of the cavitary/centrally necrotic region in the superior segment right lower lobe, which is gas-filled and which probably connects to the otherwise focally occluded bronchus intermedius. 3. Other imaging findings of potential clinical significance: Probable left anterior descending coronary atherosclerosis. Mild to moderate intrahepatic biliary dilatation mild extrahepatic biliary dilatation, much of which may be a physiologic response to cholecystectomy. Prominent stool throughout the colon favors constipation. Aortic Atherosclerosis (ICD10-I70.0).    07/26/2018 -  Chemotherapy    The patient had pegfilgrastim-cbqv (UDENYCA) injection 6 mg, 6 mg, Subcutaneous, Once, 1 of 5 cycles DOCEtaxel (TAXOTERE) 140 mg in sodium chloride 0.9 % 250 mL chemo infusion, 75 mg/m2 = 140 mg, Intravenous,  Once, 2 of 6 cycles Administration: 140 mg (07/26/2018) ramucirumab (CYRAMZA) 800 mg in sodium chloride 0.9 % 170 mL chemo infusion, 10 mg/kg = 800 mg, Intravenous, Once, 2 of 6 cycles Administration: 800 mg (07/26/2018)  for chemotherapy treatment.      CURRENT THERAPY: Third line docetaxel and cyramza q3 weeks s/p cycle 1 on 07/26/18   INTERVAL HISTORY:  Yvonne Lang returns for f/u and treatment as scheduled. She completed cycle 1 taxotere/cyramza on  07/26/18. She had moderate fatigue and nausea for 1.5 weeks. She notes this chemo  hit her harder than previous regimens. Vomited once, zofran and compazine helped. She could not help much with chores. Her fatigue has improved this week. No recent nausea, vomiting, constipation, diarrhea. Has to force herself to eat. Drinks adequately. She had thrush on her tongue but had mouth rinse left over and it resolved. Her cough and dyspnea are stable at baseline. Her pain is well managed. She has soreness on her breast bone. Denies chest pain. Her anxiety and depression are improved since changing from cymbalta to prozac. Otherwise denies rash; denies neuropathy   MEDICAL HISTORY:  Past Medical History:  Diagnosis Date  . Anxiety   . Bronchitis   . COPD (chronic obstructive pulmonary disease) ([place])   . Depression   . GERD (gastroesophageal reflux disease)   . H/O gastric bypass    1999  . Irritable bowel syndrome   . Lung cancer [facility])     SURGICAL HISTORY: Past Surgical History:  Procedure Laterality Date  . BACK SURGERY    . CHOLECYSTECTOMY    . COLON SURGERY     for a kink in her colon and 12 inchs removed  . COLONOSCOPY N/A 12/13/2013   Procedure: COLONOSCOPY;  Surgeon: Rogene Houston, MD;  Location: [facility];  Service: Endoscopy;  Laterality: N/A;  200  . ESOPHAGOGASTRODUODENOSCOPY N/A 12/13/2013   Procedure: ESOPHAGOGASTRODUODENOSCOPY (EGD);  Surgeon: Rogene Houston, MD;  Location: [facility];  Service: Endoscopy;  Laterality: N/A;  . Gastric Bypas  2000  . IR IMAGING GUIDED PORT INSERTION  11/14/2017    I have reviewed the social history and family history with the patient and they are unchanged from previous note.  ALLERGIES:  is allergic to codeine.  MEDICATIONS:  Current Outpatient Medications  Medication Sig Dispense Refill  . albuterol (PROVENTIL HFA) 108 (90 Base) MCG/ACT inhaler Inhale 2 puffs into the lungs 2 (two) times daily. 1 Inhaler 2  . albuterol (PROVENTIL) 4 MG tablet Take 4 mg by mouth 2 (two) times daily.     Marland Kitchen ALPRAZolam (XANAX) 0.25  MG tablet Take 1 tablet (0.25 mg total) by mouth at bedtime as needed for anxiety or sleep. 30 tablet 0  . butalbital-acetaminophen-caffeine (FIORICET, ESGIC) 50-325-40 MG tablet Take 1-2 tablets by mouth every 6 (six) hours as needed for headache. 60 tablet 1  . CALCIUM-MAGNESIUM-ZINC PO Take 1 tablet by mouth daily.    Marland Kitchen dicyclomine (BENTYL) 20 MG tablet Take 1 tablet (20 mg total) by mouth every 6 (six) hours. 60 tablet 1  . furosemide (LASIX) 20 MG tablet Take 1 tablet (20 mg total) by mouth daily as needed. 30 tablet 0  . gabapentin (NEURONTIN) 300 MG capsule Take 300 mg by mouth daily as needed (for pain).   4  . HYDROcodone-acetaminophen (NORCO) 10-325 MG tablet Take 2 tablets by mouth every 6 (six) hours as needed.    Marland Kitchen ipratropium-albuterol (DUONEB) 0.5-2.5 (3) MG/3ML SOLN Take 3 mLs by nebulization every 4 (four) hours as needed. (Patient taking differently: Take 3 mLs by nebulization every 4 (four) hours as needed (for shortness of breath). ) 360 mL 1  . lidocaine (XYLOCAINE) 2 % solution as directed.    . lidocaine-prilocaine (EMLA) cream Apply 1 application topically as needed. Apply to port one hour prior to port access/chemo 30 g 2  . magic mouthwash w/lidocaine SOLN Take 5 mLs by mouth 3 (three) times daily. Swish and  spit 3 x daily 240 mL 0  . MOVANTIK 25 MG TABS tablet Take 1 tablet by mouth daily.    . Multiple Vitamin (MULTIVITAMIN) tablet Take 1 tablet by mouth daily.    Marland Kitchen omeprazole (PRILOSEC) 20 MG capsule Take 1 capsule (20 mg total) by mouth 2 (two) times daily before a meal. 60 capsule 0  . ondansetron (ZOFRAN) 8 MG tablet Take 1 tablet (8 mg total) by mouth every 8 (eight) hours as needed for nausea or vomiting. 30 tablet 2  . Oxycodone HCl 10 MG TABS Take 20 mg by mouth 3 (three) times daily.    . potassium chloride SA (K-DUR,KLOR-CON) 20 MEQ tablet Take 1 tablet (20 mEq total) by mouth daily. 60 tablet 0  . prochlorperazine (COMPAZINE) 10 MG tablet Take 1 tablet (10 mg  total) by mouth every 6 (six) hours as needed for nausea or vomiting. 30 tablet 3  . rOPINIRole (REQUIP) 0.5 MG tablet Take 0.5 mg by mouth at bedtime.   11  . sucralfate (CARAFATE) 1 g tablet TAKE 1 TABLET(1 GRAM) BY MOUTH FOUR TIMES DAILY 120 tablet 0  . traMADol (ULTRAM) 50 MG tablet Take 1 tablet (50 mg total) by mouth every 6 (six) hours as needed. 90 tablet 0  . TRELEGY ELLIPTA 100-62.5-25 MCG/INH AEPB Inhale 1 puff into the lungs daily.   5  . zolpidem (AMBIEN) 10 MG tablet Take 1 tablet by mouth at bedtime.    Marland Kitchen dexamethasone (DECADRON) 4 MG tablet Take 2 tablets (8 mg total) by mouth 2 (two) times daily. Start the day before Taxotere, then take on day after chemo for 2 days 30 tablet 1   No current facility-administered medications for this visit.    Facility-Administered Medications Ordered in Other Visits  Medication Dose Route Frequency Provider Last Rate Last Dose  . DOCEtaxel (TAXOTERE) 140 mg in sodium chloride 0.9 % 250 mL chemo infusion  75 mg/m2 (Treatment Plan Recorded) Intravenous Once Truitt Merle, MD      . heparin lock flush 100 unit/mL  500 Units Intracatheter Once PRN Truitt Merle, MD        PHYSICAL EXAMINATION: ECOG PERFORMANCE STATUS: 2 - Symptomatic, <50% confined to bed  Vitals:   08/17/18 1322  BP: (!) 134/93  Pulse: 92  Resp: 16  Temp: 99.4 F (37.4 C)  SpO2: 96%   Filed Weights   08/17/18 1322  Weight: 171 lb 1.6 oz (77.6 kg)    GENERAL:alert, no distress and comfortable SKIN: no obvious rash  EYES: sclera clear  LUNGS: respirations even and unlabored  Chest: palpable left substernal nodule, 1 cm  HEART: no lower extremity edema Musculoskeletal:no cyanosis of digits and no clubbing  NEURO: alert & oriented x 3 with fluent speech, normal gait PAC without erythema  Limited exam for covid19 outbreak   LABORATORY DATA:  I have reviewed the data as listed CBC Latest Ref Rng & Units 08/17/2018 07/26/2018 07/05/2018  WBC 4.0 - 10.5 K/uL 13.4(H) 9.4 9.3   Hemoglobin 12.0 - 15.0 g/dL 11.9(L) 10.8(L) 10.6(L)  Hematocrit 36.0 - 46.0 % 38.6 34.9(L) 34.7(L)  Platelets 150 - 400 K/uL 472(H) 466(H) 621(H)     CMP Latest Ref Rng & Units 08/17/2018 07/26/2018 07/05/2018  Glucose 70 - 99 mg/dL 110(H) 102(H) 120(H)  BUN 6 - 20 mg/dL 7 7 8   Creatinine 0.44 - 1.00 mg/dL 0.74 0.70 0.64  Sodium 135 - 145 mmol/L 135 136 137  Potassium 3.5 - 5.1 mmol/L 4.2 3.8 3.7  Chloride 98 - 111 mmol/L 99 100 100  CO2 22 - 32 mmol/L 28 27 24   Calcium 8.9 - 10.3 mg/dL 8.7(L) 8.4(L) 8.3(L)  Total Protein 6.5 - 8.1 g/dL 6.4(L) 6.1(L) 6.8  Total Bilirubin 0.3 - 1.2 mg/dL 0.2(L) 0.3 0.2(L)  Alkaline Phos 38 - 126 U/L 343(H) 394(H) 548(H)  AST 15 - 41 U/L 19 32 33  ALT 0 - 44 U/L 17 66(H) 39      RADIOGRAPHIC STUDIES: I have personally reviewed the radiological images as listed and agreed with the findings in the report. No results found.   ASSESSMENT & PLAN: Yvonne Lang is a 53 y.o. female with   1. 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 -Diagnosed in 09/2017. Treated with chemo and radiation. She was initially onfirst linecarboplatin, paclitaxel, Atezolizumab and bevacizumab every 3weekss/p 6 cycles, with good partial response.Switched tomaintenance therapy withAvastinandAtezolizumabq3weeksstartedon 03/24/18. -Due to disease progression, chemoswitched tosecond line alimta 500mg /m2 every 3 weekson 05/02/2018, shehas been tolerating well -CT CAP 07/21/18 showing disease progression, Dr. Burr Medico recommended changing to third line treatment docetaxel and Cyramza every 3 weeks; the goal is palliative  2. Dyspnea and dry cough -secondary to her COPD, right lung cancer and radiation changes continue bronchodilator 3. Frontal Headaches - stable, followed by Dr. Mickeal Skinner  4. Anxiety and Depression -Currently on Xanax and Prozac; mood improved after cymbalta was switched to prozac. 5. Chronic back pain -Currently on  Tramadol 50 mg q6Hr.Stable 6. Low appetite, Nausea-Currently on Mirtazapine, omeprazole, ondansetron,prochlorperazine, and sucralfate. 7. Hypokalemia - on K replacement 16meq daily 8.Transaminitis - likely related to chemo 9.Goal of care discussion - full code   Yvonne Lang appears stable. She began 3rd line taxotere and cyramza on 07/26/18. She tolerated first cycle moderately well with moderate fatigue and nausea with vomiting (x1) lasting 1.5 weeks. She has recovered well today. Her respiratory function and pain level are at baseline. Mood is improved. We reviewed symptom management. We reviewed dex pre/post meds with chemo, I refilled for her. Labs reviewed, CBC and CMP are stable. WBC slightly elevated possibly related to steroids. She will proceed with cycle 2 taxotere and cyramza today, hold Udenyca. F/u in 3 weeks with cycle 3.    PLAN: -Labs reviewed -Proceed with cycle 2 taxotere and cyramza today  -Cancel Udenyca on 5/30 -Completed and returned disability form to patient today  -F/u in 3 weeks with cycle 3 -Refilled decadron   All questions were answered. The patient knows to call the clinic with any problems, questions or concerns. No barriers to learning was detected.     Alla Feeling, NP 08/17/18

## 2018-08-17 ENCOUNTER — Inpatient Hospital Stay: Payer: BLUE CROSS/BLUE SHIELD

## 2018-08-17 ENCOUNTER — Other Ambulatory Visit: Payer: Self-pay

## 2018-08-17 ENCOUNTER — Encounter: Payer: Self-pay | Admitting: Nurse Practitioner

## 2018-08-17 ENCOUNTER — Inpatient Hospital Stay (HOSPITAL_BASED_OUTPATIENT_CLINIC_OR_DEPARTMENT_OTHER): Payer: BLUE CROSS/BLUE SHIELD | Admitting: Nurse Practitioner

## 2018-08-17 VITALS — BP 134/93 | HR 92 | Temp 99.4°F | Resp 16 | Ht 67.0 in | Wt 171.1 lb

## 2018-08-17 DIAGNOSIS — F419 Anxiety disorder, unspecified: Secondary | ICD-10-CM | POA: Diagnosis not present

## 2018-08-17 DIAGNOSIS — Z9884 Bariatric surgery status: Secondary | ICD-10-CM

## 2018-08-17 DIAGNOSIS — R51 Headache: Secondary | ICD-10-CM

## 2018-08-17 DIAGNOSIS — Z79899 Other long term (current) drug therapy: Secondary | ICD-10-CM | POA: Diagnosis not present

## 2018-08-17 DIAGNOSIS — C3431 Malignant neoplasm of lower lobe, right bronchus or lung: Secondary | ICD-10-CM

## 2018-08-17 DIAGNOSIS — G8929 Other chronic pain: Secondary | ICD-10-CM | POA: Diagnosis not present

## 2018-08-17 DIAGNOSIS — Z923 Personal history of irradiation: Secondary | ICD-10-CM

## 2018-08-17 DIAGNOSIS — Z7951 Long term (current) use of inhaled steroids: Secondary | ICD-10-CM | POA: Diagnosis not present

## 2018-08-17 DIAGNOSIS — K219 Gastro-esophageal reflux disease without esophagitis: Secondary | ICD-10-CM | POA: Diagnosis not present

## 2018-08-17 DIAGNOSIS — C3490 Malignant neoplasm of unspecified part of unspecified bronchus or lung: Secondary | ICD-10-CM

## 2018-08-17 DIAGNOSIS — Z95828 Presence of other vascular implants and grafts: Secondary | ICD-10-CM

## 2018-08-17 DIAGNOSIS — C7971 Secondary malignant neoplasm of right adrenal gland: Secondary | ICD-10-CM | POA: Diagnosis not present

## 2018-08-17 DIAGNOSIS — Z5112 Encounter for antineoplastic immunotherapy: Secondary | ICD-10-CM | POA: Diagnosis not present

## 2018-08-17 DIAGNOSIS — E876 Hypokalemia: Secondary | ICD-10-CM

## 2018-08-17 DIAGNOSIS — R740 Nonspecific elevation of levels of transaminase and lactic acid dehydrogenase [LDH]: Secondary | ICD-10-CM

## 2018-08-17 DIAGNOSIS — Z5111 Encounter for antineoplastic chemotherapy: Secondary | ICD-10-CM | POA: Diagnosis not present

## 2018-08-17 DIAGNOSIS — Z9221 Personal history of antineoplastic chemotherapy: Secondary | ICD-10-CM

## 2018-08-17 DIAGNOSIS — R110 Nausea: Secondary | ICD-10-CM

## 2018-08-17 DIAGNOSIS — F329 Major depressive disorder, single episode, unspecified: Secondary | ICD-10-CM

## 2018-08-17 DIAGNOSIS — J440 Chronic obstructive pulmonary disease with acute lower respiratory infection: Secondary | ICD-10-CM

## 2018-08-17 LAB — CMP (CANCER CENTER ONLY)
ALT: 17 U/L (ref 0–44)
AST: 19 U/L (ref 15–41)
Albumin: 2.7 g/dL — ABNORMAL LOW (ref 3.5–5.0)
Alkaline Phosphatase: 343 U/L — ABNORMAL HIGH (ref 38–126)
Anion gap: 8 (ref 5–15)
BUN: 7 mg/dL (ref 6–20)
CO2: 28 mmol/L (ref 22–32)
Calcium: 8.7 mg/dL — ABNORMAL LOW (ref 8.9–10.3)
Chloride: 99 mmol/L (ref 98–111)
Creatinine: 0.74 mg/dL (ref 0.44–1.00)
GFR, Est AFR Am: >60 mL/min (ref >60)
GFR, Estimated: >60 mL/min (ref >60)
Glucose, Bld: 110 mg/dL — ABNORMAL HIGH (ref 70–99)
Potassium: 4.2 mmol/L (ref 3.5–5.1)
Sodium: 135 mmol/L (ref 135–145)
Total Bilirubin: 0.2 mg/dL — ABNORMAL LOW (ref 0.3–1.2)
Total Protein: 6.4 g/dL — ABNORMAL LOW (ref 6.5–8.1)

## 2018-08-17 LAB — CBC WITH DIFFERENTIAL (CANCER CENTER ONLY)
Abs Immature Granulocytes: 0.06 10*3/uL (ref 0.00–0.07)
Basophils Absolute: 0 10*3/uL (ref 0.0–0.1)
Basophils Relative: 0 %
Eosinophils Absolute: 0 10*3/uL (ref 0.0–0.5)
Eosinophils Relative: 0 %
HCT: 38.6 % (ref 36.0–46.0)
Hemoglobin: 11.9 g/dL — ABNORMAL LOW (ref 12.0–15.0)
Immature Granulocytes: 0 %
Lymphocytes Relative: 9 %
Lymphs Abs: 1.1 10*3/uL (ref 0.7–4.0)
MCH: 29.3 pg (ref 26.0–34.0)
MCHC: 30.8 g/dL (ref 30.0–36.0)
MCV: 95.1 fL (ref 80.0–100.0)
Monocytes Absolute: 0.5 10*3/uL (ref 0.1–1.0)
Monocytes Relative: 4 %
Neutro Abs: 11.7 10*3/uL — ABNORMAL HIGH (ref 1.7–7.7)
Neutrophils Relative %: 87 %
Platelet Count: 472 10*3/uL — ABNORMAL HIGH (ref 150–400)
RBC: 4.06 MIL/uL (ref 3.87–5.11)
RDW: 14.9 % (ref 11.5–15.5)
WBC Count: 13.4 10*3/uL — ABNORMAL HIGH (ref 4.0–10.5)
nRBC: 0 % (ref 0.0–0.2)

## 2018-08-17 LAB — TOTAL PROTEIN, URINE DIPSTICK: Protein, ur: NEGATIVE mg/dL

## 2018-08-17 MED ORDER — SODIUM CHLORIDE 0.9 % IV SOLN
Freq: Once | INTRAVENOUS | Status: AC
  Administered 2018-08-17: 14:00:00 via INTRAVENOUS
  Filled 2018-08-17: qty 250

## 2018-08-17 MED ORDER — SODIUM CHLORIDE 0.9% FLUSH
10.0000 mL | INTRAVENOUS | Status: DC | PRN
  Administered 2018-08-17: 10 mL
  Filled 2018-08-17: qty 10

## 2018-08-17 MED ORDER — DEXAMETHASONE SODIUM PHOSPHATE 10 MG/ML IJ SOLN
10.0000 mg | Freq: Once | INTRAMUSCULAR | Status: AC
  Administered 2018-08-17: 10 mg via INTRAVENOUS

## 2018-08-17 MED ORDER — SODIUM CHLORIDE 0.9% FLUSH
10.0000 mL | INTRAVENOUS | Status: DC | PRN
  Administered 2018-08-17: 17:00:00 10 mL
  Filled 2018-08-17: qty 10

## 2018-08-17 MED ORDER — DIPHENHYDRAMINE HCL 50 MG/ML IJ SOLN
50.0000 mg | Freq: Once | INTRAMUSCULAR | Status: AC
  Administered 2018-08-17: 14:00:00 50 mg via INTRAVENOUS

## 2018-08-17 MED ORDER — SODIUM CHLORIDE 0.9 % IV SOLN
75.0000 mg/m2 | Freq: Once | INTRAVENOUS | Status: AC
  Administered 2018-08-17: 16:00:00 140 mg via INTRAVENOUS
  Filled 2018-08-17: qty 14

## 2018-08-17 MED ORDER — HEPARIN SOD (PORK) LOCK FLUSH 100 UNIT/ML IV SOLN
500.0000 [IU] | Freq: Once | INTRAVENOUS | Status: AC | PRN
  Administered 2018-08-17: 17:00:00 500 [IU]
  Filled 2018-08-17: qty 5

## 2018-08-17 MED ORDER — DEXAMETHASONE SODIUM PHOSPHATE 10 MG/ML IJ SOLN
INTRAMUSCULAR | Status: AC
  Filled 2018-08-17: qty 1

## 2018-08-17 MED ORDER — DEXAMETHASONE 4 MG PO TABS: 8.0000 mg | ORAL_TABLET | Freq: Two times a day (BID) | ORAL | 1 refills | Status: DC

## 2018-08-17 MED ORDER — ACETAMINOPHEN 325 MG PO TABS
ORAL_TABLET | ORAL | Status: AC
  Filled 2018-08-17: qty 2

## 2018-08-17 MED ORDER — ACETAMINOPHEN 325 MG PO TABS
650.0000 mg | ORAL_TABLET | Freq: Once | ORAL | Status: AC
  Administered 2018-08-17: 14:00:00 650 mg via ORAL

## 2018-08-17 MED ORDER — SODIUM CHLORIDE 0.9 % IV SOLN
10.0000 mg/kg | Freq: Once | INTRAVENOUS | Status: AC
  Administered 2018-08-17: 15:00:00 800 mg via INTRAVENOUS
  Filled 2018-08-17: qty 50

## 2018-08-17 MED ORDER — DIPHENHYDRAMINE HCL 50 MG/ML IJ SOLN
INTRAMUSCULAR | Status: AC
  Filled 2018-08-17: qty 1

## 2018-08-17 NOTE — Patient Instructions (Signed)
[facility] Discharge Instructions for Patients Receiving Chemotherapy  Today you received the following chemotherapy agents: ramucirumab (Cyramza) and docetaxel (Taxotere)  To help prevent nausea and vomiting after your treatment, we encourage you to take your nausea medication as directed.   If you develop nausea and vomiting that is not controlled by your nausea medication, call the clinic.   BELOW ARE SYMPTOMS THAT SHOULD BE REPORTED IMMEDIATELY:  *FEVER GREATER THAN 100.5 F  *CHILLS WITH OR WITHOUT FEVER  NAUSEA AND VOMITING THAT IS NOT CONTROLLED WITH YOUR NAUSEA MEDICATION  *UNUSUAL SHORTNESS OF BREATH  *UNUSUAL BRUISING OR BLEEDING  TENDERNESS IN MOUTH AND THROAT WITH OR WITHOUT PRESENCE OF ULCERS  *URINARY PROBLEMS  *BOWEL PROBLEMS  UNUSUAL RASH Items with * indicate a potential emergency and should be followed up as soon as possible.  Feel free to call the clinic should you have any questions or concerns. The clinic phone number is [phone].  Please show the [facility] at check-in to the Emergency Department and triage nurse.  Ramucirumab injection What is this medicine? RAMUCIRUMAB (ra mue SIR ue mab) is a monoclonal antibody. It is used to treat stomach cancer, colorectal cancer, liver cancer, and lung cancer. This medicine may be used for other purposes; ask your health care provider or pharmacist if you have questions. COMMON BRAND NAME(S): Cyramza What should I tell my health care provider before I take this medicine? They need to know if you have any of these conditions: -bleeding disorders -blood clots -heart disease, including heart failure, heart attack, or chest pain (angina) -high blood pressure -infection (especially a virus infection such as chickenpox, cold sores, or herpes) -protein in your urine -recent surgery -stroke -an unusual or allergic reaction to ramucirumab, other medicines, foods, dyes, or  preservatives -pregnant or trying to get pregnant -breast-feeding How should I use this medicine? This medicine is for infusion into a vein. It is given by a health care professional in a hospital or clinic setting. Talk to your pediatrician regarding the use of this medicine in children. Special care may be needed. Overdosage: If you think you have taken too much of this medicine contact a poison control center or emergency room at once. NOTE: This medicine is only for you. Do not share this medicine with others. What if I miss a dose? It is important not to miss your dose. Call your doctor or health care professional if you are unable to keep an appointment. What may interact with this medicine? Interactions have not been studied. This list may not describe all possible interactions. Give your health care provider a list of all the medicines, herbs, non-prescription drugs, or dietary supplements you use. Also tell them if you smoke, drink alcohol, or use illegal drugs. Some items may interact with your medicine. What should I watch for while using this medicine? Your condition will be monitored carefully while you are receiving this medicine. You will need to to check your blood pressure and have your blood and urine tested while you are taking this medicine. Your condition will be monitored carefully while you are receiving this medicine. This medicine may increase your risk to bruise or bleed. Call your doctor or health care professional if you notice any unusual bleeding. This medicine may rarely cause 'gastrointestinal perforation' (holes in the stomach, intestines or colon), a serious side effect requiring surgery to repair. This medicine should be started at least 28 days following major surgery and the site of the  surgery should be totally healed. Check with your doctor before scheduling dental work or surgery while you are receiving this treatment. Talk to your doctor if you have recently  had surgery or if you have a wound that has not healed. Do not become pregnant while taking this medicine or for 3 months after stopping it. Women should inform their doctor if they wish to become pregnant or think they might be pregnant. There is a potential for serious side effects to an unborn child. Talk to your health care professional or pharmacist for more information. Do not breast-feed an infant while taking this medicine or for 2 months after stopping it. This medicine may interfere with the ability to have a child. Talk with your doctor or health care professional if you are concerned about your fertility. What side effects may I notice from receiving this medicine? Side effects that you should report to your doctor or health care professional as soon as possible: -allergic reactions like skin rash, itching or hives, breathing problems, swelling of the face, lips, or tongue -signs of infection - fever or chills, cough, sore throat -chest pain or chest tightness -confusion -dizziness -feeling faint or lightheaded, falls -severe abdominal pain -severe nausea, vomiting -signs and symptoms of bleeding such as bloody or black, tarry stools; red or dark-brown urine; spitting up blood or brown material that looks like coffee grounds; red spots on the skin; unusual bruising or bleeding from the eye, gums, or nose -signs and symptoms of a blood clot such as breathing problems; changes in vision; chest pain; severe, sudden headache; pain, swelling, warmth in the leg; trouble speaking; sudden numbness or weakness of the face, arm or leg -symptoms of a stroke: change in mental awareness, inability to talk or move one side of the body -trouble walking, dizziness, loss of balance or coordination Side effects that usually do not require medical attention (report to your doctor or health care professional if they continue or are bothersome): -cold, clammy  skin -constipation -diarrhea -headache -nausea, vomiting -stomach pain -unusually slow heartbeat -unusually weak or tired This list may not describe all possible side effects. Call your doctor for medical advice about side effects. You may report side effects to FDA at 1-800-FDA-1088. Where should I keep my medicine? This drug is given in a hospital or clinic and will not be stored at home. NOTE: This sheet is a summary. It may not cover all possible information. If you have questions about this medicine, talk to your doctor, pharmacist, or health care provider.  2019 Elsevier/Gold Standard (2017-08-17 11:25:23)  Docetaxel injection What is this medicine? DOCETAXEL (doe se TAX el) is a chemotherapy drug. It targets fast dividing cells, like cancer cells, and causes these cells to die. This medicine is used to treat many types of cancers like breast cancer, certain stomach cancers, head and neck cancer, lung cancer, and prostate cancer. This medicine may be used for other purposes; ask your health care provider or pharmacist if you have questions. COMMON BRAND NAME(S): Docefrez, Taxotere What should I tell my health care provider before I take this medicine? They need to know if you have any of these conditions: -infection (especially a virus infection such as chickenpox, cold sores, or herpes) -liver disease -low blood counts, like low white cell, platelet, or red cell counts -an unusual or allergic reaction to docetaxel, polysorbate 80, other chemotherapy agents, other medicines, foods, dyes, or preservatives -pregnant or trying to get pregnant -breast-feeding How should I use this  medicine? This drug is given as an infusion into a vein. It is administered in a hospital or clinic by a specially trained health care professional. Talk to your pediatrician regarding the use of this medicine in children. Special care may be needed. Overdosage: If you think you have taken too much of this  medicine contact a poison control center or emergency room at once. NOTE: This medicine is only for you. Do not share this medicine with others. What if I miss a dose? It is important not to miss your dose. Call your doctor or health care professional if you are unable to keep an appointment. What may interact with this medicine? -cyclosporine -erythromycin -ketoconazole -medicines to increase blood counts like filgrastim, pegfilgrastim, sargramostim -vaccines Talk to your doctor or health care professional before taking any of these medicines: -acetaminophen -aspirin -ibuprofen -ketoprofen -naproxen This list may not describe all possible interactions. Give your health care provider a list of all the medicines, herbs, non-prescription drugs, or dietary supplements you use. Also tell them if you smoke, drink alcohol, or use illegal drugs. Some items may interact with your medicine. What should I watch for while using this medicine? Your condition will be monitored carefully while you are receiving this medicine. You will need important blood work done while you are taking this medicine. This drug may make you feel generally unwell. This is not uncommon, as chemotherapy can affect healthy cells as well as cancer cells. Report any side effects. Continue your course of treatment even though you feel ill unless your doctor tells you to stop. In some cases, you may be given additional medicines to help with side effects. Follow all directions for their use. Call your doctor or health care professional for advice if you get a fever, chills or sore throat, or other symptoms of a cold or flu. Do not treat yourself. This drug decreases your body's ability to fight infections. Try to avoid being around people who are sick. This medicine may increase your risk to bruise or bleed. Call your doctor or health care professional if you notice any unusual bleeding. This medicine may contain alcohol in the  product. You may get drowsy or dizzy. Do not drive, use machinery, or do anything that needs mental alertness until you know how this medicine affects you. Do not stand or sit up quickly, especially if you are an older patient. This reduces the risk of dizzy or fainting spells. Avoid alcoholic drinks. Do not become pregnant while taking this medicine or for 6 months after stopping it. Women should inform their doctor if they wish to become pregnant or think they might be pregnant. Men should not father a child while taking this medicine and for 3 months after stopping it. There is a potential for serious side effects to an unborn child. Talk to your health care professional or pharmacist for more information. Do not breast-feed an infant while taking this medicine or for 2 weeks after stopping it. This may interfere with the ability to father a child. You should talk to your doctor or health care professional if you are concerned about your fertility. What side effects may I notice from receiving this medicine? Side effects that you should report to your doctor or health care professional as soon as possible: -allergic reactions like skin rash, itching or hives, swelling of the face, lips, or tongue -low blood counts - This drug may decrease the number of white blood cells, red blood cells and platelets. You  may be at increased risk for infections and bleeding. -signs of infection - fever or chills, cough, sore throat, pain or difficulty passing urine -signs of decreased platelets or bleeding - bruising, pinpoint red spots on the skin, black, tarry stools, nosebleeds -signs of decreased red blood cells - unusually weak or tired, fainting spells, lightheadedness -breathing problems -fast or irregular heartbeat -low blood pressure -mouth sores -nausea and vomiting -pain, swelling, redness or irritation at the injection site -pain, tingling, numbness in the hands or feet -swelling of the ankle, feet,  hands -weight gain Side effects that usually do not require medical attention (report to your doctor or health care professional if they continue or are bothersome): -bone pain -complete hair loss including hair on your head, underarms, pubic hair, eyebrows, and eyelashes -diarrhea -excessive tearing -changes in the color of fingernails -loosening of the fingernails -nausea -muscle pain -red flush to skin -sweating -weak or tired This list may not describe all possible side effects. Call your doctor for medical advice about side effects. You may report side effects to FDA at 1-800-FDA-1088. Where should I keep my medicine? This drug is given in a hospital or clinic and will not be stored at home. NOTE: This sheet is a summary. It may not cover all possible information. If you have questions about this medicine, talk to your doctor, pharmacist, or health care provider.  2019 Elsevier/Gold Standard (2017-04-04 12:07:21)

## 2018-08-18 ENCOUNTER — Telehealth: Payer: Self-pay | Admitting: Nurse Practitioner

## 2018-08-18 NOTE — Telephone Encounter (Signed)
Scheduled appt per 5/28 los.  A calendar will be mailed out.

## 2018-08-19 ENCOUNTER — Inpatient Hospital Stay: Payer: BLUE CROSS/BLUE SHIELD

## 2018-08-20 ENCOUNTER — Other Ambulatory Visit: Payer: Self-pay | Admitting: Hematology

## 2018-08-20 DIAGNOSIS — C3490 Malignant neoplasm of unspecified part of unspecified bronchus or lung: Secondary | ICD-10-CM

## 2018-08-21 ENCOUNTER — Encounter: Payer: Self-pay | Admitting: Nurse Practitioner

## 2018-08-22 ENCOUNTER — Encounter: Payer: Self-pay | Admitting: Nurse Practitioner

## 2018-08-22 ENCOUNTER — Other Ambulatory Visit: Payer: Self-pay | Admitting: Nurse Practitioner

## 2018-08-22 DIAGNOSIS — C3490 Malignant neoplasm of unspecified part of unspecified bronchus or lung: Secondary | ICD-10-CM

## 2018-08-22 MED ORDER — NYSTATIN 100000 UNIT/ML MT SUSP: 5.0000 mL | Freq: Four times a day (QID) | OROMUCOSAL | 0 refills | Status: DC

## 2018-08-22 MED ORDER — MAGIC MOUTHWASH W/LIDOCAINE: 5.0000 mL | Freq: Three times a day (TID) | ORAL | 0 refills | Status: AC | PRN

## 2018-08-24 ENCOUNTER — Other Ambulatory Visit: Payer: Self-pay | Admitting: Hematology

## 2018-08-24 DIAGNOSIS — C3490 Malignant neoplasm of unspecified part of unspecified bronchus or lung: Secondary | ICD-10-CM

## 2018-08-29 ENCOUNTER — Telehealth: Payer: Self-pay | Admitting: Nurse Practitioner

## 2018-08-29 ENCOUNTER — Encounter: Payer: Self-pay | Admitting: Nurse Practitioner

## 2018-08-29 ENCOUNTER — Ambulatory Visit (HOSPITAL_COMMUNITY)
Admission: RE | Admit: 2018-08-29 | Discharge: 2018-08-29 | Disposition: A | Discharge status: Home / Self Care | Payer: BC Managed Care – PPO | Source: Ambulatory Visit | Attending: Nurse Practitioner | Admitting: Nurse Practitioner

## 2018-08-29 ENCOUNTER — Other Ambulatory Visit: Payer: Self-pay

## 2018-08-29 ENCOUNTER — Inpatient Hospital Stay: Payer: BC Managed Care – PPO | Attending: Hematology | Admitting: Medical

## 2018-08-29 VITALS — BP 127/85 | HR 105 | Temp 97.8°F | Resp 18 | Ht 67.0 in | Wt 170.7 lb

## 2018-08-29 DIAGNOSIS — C7971 Secondary malignant neoplasm of right adrenal gland: Secondary | ICD-10-CM | POA: Diagnosis not present

## 2018-08-29 DIAGNOSIS — R0602 Shortness of breath: Secondary | ICD-10-CM | POA: Insufficient documentation

## 2018-08-29 DIAGNOSIS — Z9221 Personal history of antineoplastic chemotherapy: Secondary | ICD-10-CM | POA: Diagnosis not present

## 2018-08-29 DIAGNOSIS — Z923 Personal history of irradiation: Secondary | ICD-10-CM

## 2018-08-29 DIAGNOSIS — C3490 Malignant neoplasm of unspecified part of unspecified bronchus or lung: Secondary | ICD-10-CM

## 2018-08-29 DIAGNOSIS — Z7984 Long term (current) use of oral hypoglycemic drugs: Secondary | ICD-10-CM

## 2018-08-29 DIAGNOSIS — J441 Chronic obstructive pulmonary disease with (acute) exacerbation: Secondary | ICD-10-CM | POA: Insufficient documentation

## 2018-08-29 DIAGNOSIS — Z79899 Other long term (current) drug therapy: Secondary | ICD-10-CM | POA: Diagnosis not present

## 2018-08-29 DIAGNOSIS — R05 Cough: Secondary | ICD-10-CM | POA: Diagnosis not present

## 2018-08-29 DIAGNOSIS — C3431 Malignant neoplasm of lower lobe, right bronchus or lung: Secondary | ICD-10-CM | POA: Diagnosis not present

## 2018-08-29 DIAGNOSIS — Z87891 Personal history of nicotine dependence: Secondary | ICD-10-CM | POA: Insufficient documentation

## 2018-08-29 DIAGNOSIS — F419 Anxiety disorder, unspecified: Secondary | ICD-10-CM

## 2018-08-29 DIAGNOSIS — F329 Major depressive disorder, single episode, unspecified: Secondary | ICD-10-CM | POA: Diagnosis not present

## 2018-08-29 DIAGNOSIS — E876 Hypokalemia: Secondary | ICD-10-CM | POA: Diagnosis not present

## 2018-08-29 DIAGNOSIS — Z9884 Bariatric surgery status: Secondary | ICD-10-CM | POA: Diagnosis not present

## 2018-08-29 MED ORDER — SULFAMETHOXAZOLE-TRIMETHOPRIM 800-160 MG PO TABS: 1.0000 | ORAL_TABLET | Freq: Two times a day (BID) | ORAL | 0 refills | Status: DC

## 2018-08-29 MED ORDER — PREDNISONE 5 MG PO TABS: ORAL_TABLET | ORAL | 0 refills | Status: DC

## 2018-08-29 NOTE — Telephone Encounter (Signed)
I called Ms. Yvonne Lang to discuss her symptoms. One day ago she abruptly developed increased dyspnea from baseline. She notes conversational dyspnea and on exertion. Has COPD, cough is productive in the morning only then dry after she clears phlegm. Checked her sister's pulse ox monitor, O2 95%. she took an old dose of prednisone. Today dyspnea is slightly better, but she is getting nervous. Also tried lasix to see if there was "fluid on my lungs." Has GERD, no overt chest pain. Denies fever, chills, leg edema. She goes to church, but wears a mask and social distances; denies known sick/covid contacts. I discussed with Sandi Mealy, PA who will see the patient after chest xray today. Patient understands the plan. Her mother drives her. I sent schedule messages and placed order.  Yvonne Rue, NP  08/29/18

## 2018-08-29 NOTE — Patient Instructions (Signed)

## 2018-08-29 NOTE — Progress Notes (Signed)
Pt seen by PA Van only, no RN assessment at this time.  PA aware. 

## 2018-08-29 NOTE — Progress Notes (Signed)
[facility] Progress Note   Yvonne Lang [id number] 07/17/1965 53 y.o.  Yvonne Lang is managed by Dr. Truitt Merle   Actively treated with chemotherapy/immunotherapy/hormonal therapy: yes  Current therapy: Docetaxel and Cyramza  Last treated: 08/17/2018 (cycle 2, day 1)  Next scheduled appointment with provider: 09/07/2018  Assessment: Plan:    Metastatic non-small cell lung cancer ([place])  COPD exacerbation ([place]) - Plan: predniSONE (DELTASONE) 5 MG tablet, sulfamethoxazole-trimethoprim (BACTRIM DS) 800-160 MG tablet   Metastatic non-small cell lung cancer: The patient is status post cycle 2, day 1 of docetaxel and Cyramza which was dosed on 08/17/2018.  She is scheduled to be seen in follow-up on 09/07/2018.  COPD exacerbation.  The patient is status post a chest x-ray with results pending.  She continues to use Trelegy Ellipta, Proventil, and Duoneb inhalers.  She reports that her breathing was better today except she restarted steroids which she had at home.  She was given a 6-day prednisone taper which she will begin tomorrow in addition to a 7-day course of Bactrim DS p.o. twice daily.  Please see After Visit Summary for patient specific instructions.  Future Appointments  Date Time Provider [place]  09/07/2018 12:30 PM [facility] LAB 5 [facility] None  09/07/2018 12:45 PM [facility] [place] None  09/07/2018  1:15 PM Alla Feeling, NP [facility] None  09/07/2018  2:15 PM [facility] INFUSION [facility] None  09/28/2018  9:00 AM [facility] LAB 3 [facility] None  09/28/2018  9:15 AM [facility] [place] FLUSH [facility] None  09/28/2018  9:45 AM Truitt Merle, MD [facility] None  09/28/2018 11:00 AM [facility] INFUSION [facility] None    No orders of the defined types were placed in this encounter.      Subjective:   Patient ID:  Yvonne Lang is a 53 y.o. (DOB Jan 09, 1966) female.  Chief Complaint:  Chief Complaint  Patient presents  with  . Shortness of Breath    related to disease process    HPI Yvonne Lang is a 53 year old female with a history of metastatic non-small cell lung cancer who is managed by Dr. Burr Medico and is status post cycle 2, day 1 of docetaxel and Cyramza which was dosed on 08/17/2018.  She presents to the office today after calling yesterday stating that she was having shortness of breath.  She reports that she was outside for an extended period on Sunday afternoon without a mask.  She believes that she could have been exposed to pollen.  She has been using Trelegy Ellipta, Proventil, and Duoneb inhalers.  She additionally had some prednisone at home which she restarted.  She states that her chest was tight this morning but is better now.  She has been checking her oxygen saturation at home.  It is been holding in the mid 90s.  She had a chest x-ray completed with results not yet finalized.  She denies fevers, chills, or sweats.  Her cough is nonproductive.   Medications: I have reviewed the patient's current medications.  Allergies:  Allergies  Allergen Reactions  . Codeine Other (See Comments)    Migraine headache    Past Medical History:  Diagnosis Date  . Anxiety   . Bronchitis   . COPD (chronic obstructive pulmonary disease) ([place])   . Depression   . GERD (gastroesophageal reflux disease)   . H/O gastric bypass    1999  . Irritable bowel syndrome   . Lung cancer [facility])     Past Surgical History:  Procedure Laterality  Date  . BACK SURGERY    . CHOLECYSTECTOMY    . COLON SURGERY     for a kink in her colon and 12 inchs removed  . COLONOSCOPY N/A 12/13/2013   Procedure: COLONOSCOPY;  Surgeon: Rogene Houston, MD;  Location: [facility];  Service: Endoscopy;  Laterality: N/A;  200  . ESOPHAGOGASTRODUODENOSCOPY N/A 12/13/2013   Procedure: ESOPHAGOGASTRODUODENOSCOPY (EGD);  Surgeon: Rogene Houston, MD;  Location: [facility];  Service: Endoscopy;  Laterality: N/A;  . Gastric Bypas   2000  . IR IMAGING GUIDED PORT INSERTION  11/14/2017    Family History  Problem Relation Age of Onset  . Other Mother        has a pacemaker; rectal prolapse; rods in legs; lung fibrosis   . Other Father        heart gave out  . Hypertension Sister   . Bronchitis Sister   . Heart attack Paternal Grandmother   . COPD Maternal Grandmother   . Emphysema Maternal Grandmother   . Alcoholism Maternal Grandfather   . Cancer Sister        rare skin cancer  . Cancer Maternal Uncle 7       colon cancer   . Colon cancer Maternal Uncle     Social History   Socioeconomic History  . Marital status: Single    Spouse name: Not on file  . Number of children: Not on file  . Years of education: Not on file  . Highest education level: Not on file  Occupational History  . Not on file  Social Needs  . Financial resource strain: Not on file  . Food insecurity:    Worry: Not on file    Inability: Not on file  . Transportation needs:    Medical: Not on file    Non-medical: Not on file  Tobacco Use  . Smoking status: Former Smoker    Packs/day: 1.00    Years: 40.00    Pack years: 40.00    Types: Cigarettes  . Smokeless tobacco: Never Used  . Tobacco comment: Pack a day x 30 yrs. Trying to stop smoking using nicotine gum.   Substance and Sexual Activity  . Alcohol use: No  . Drug use: No  . Sexual activity: Not Currently    Birth control/protection: Post-menopausal  Lifestyle  . Physical activity:    Days per week: Not on file    Minutes per session: Not on file  . Stress: Not on file  Relationships  . Social connections:    Talks on phone: Not on file    Gets together: Not on file    Attends religious service: Not on file    Active member of club or organization: Not on file    Attends meetings of clubs or organizations: Not on file    Relationship status: Not on file  . Intimate partner violence:    Fear of current or ex partner: Not on file    Emotionally abused: Not on  file    Physically abused: Not on file    Forced sexual activity: Not on file  Other Topics Concern  . Not on file  Social History Narrative  . Not on file    Past Medical History, Surgical history, Social history, and Family history were reviewed and updated as appropriate.   Please see review of systems for further details on the patient's review from today.   Review of Systems:  Review of Systems  Constitutional: Negative for chills, diaphoresis and fever.  HENT: Negative for trouble swallowing and voice change.   Respiratory: Positive for cough and shortness of breath. Negative for choking, chest tightness, wheezing and stridor.   Cardiovascular: Negative for chest pain and palpitations.  Gastrointestinal: Negative for abdominal pain, constipation, diarrhea, nausea and vomiting.  Musculoskeletal: Negative for back pain and myalgias.  Neurological: Negative for dizziness, light-headedness and headaches.    Objective:   Physical Exam:  BP 127/85 (BP Location: Left Arm, Patient Position: Sitting)   Pulse (!) 105 Comment: [place] aware of pulse  Temp 97.8 F (36.6 C) (Oral)   Resp 18   Ht 5\' 7"  (1.702 m)   Wt 170 lb 11.2 oz (77.4 kg)   SpO2 99%   BMI 26.74 kg/m  ECOG: 1  Physical Exam Constitutional:      General: She is not in acute distress.    Appearance: She is not diaphoretic.  HENT:     Head: Normocephalic and atraumatic.  Cardiovascular:     Rate and Rhythm: Regular rhythm. Tachycardia present.     Heart sounds: Normal heart sounds. No murmur. No friction rub. No gallop.   Pulmonary:     Effort: Pulmonary effort is normal. No respiratory distress.     Breath sounds: Normal breath sounds. No stridor. No wheezing or rales.  Skin:    General: Skin is warm and dry.     Coloration: Skin is not pale.     Findings: No erythema.  Neurological:     Mental Status: She is alert.  Psychiatric:        Behavior: Behavior normal.        Thought Content: Thought  content normal.        Judgment: Judgment normal.     Lab Review:     Component Value Date/Time   NA 135 08/17/2018 1316   K 4.2 08/17/2018 1316   CL 99 08/17/2018 1316   CO2 28 08/17/2018 1316   GLUCOSE 110 (H) 08/17/2018 1316   BUN 7 08/17/2018 1316   CREATININE 0.74 08/17/2018 1316   CREATININE 0.59 10/25/2013 1617   CALCIUM 8.7 (L) 08/17/2018 1316   PROT 6.4 (L) 08/17/2018 1316   ALBUMIN 2.7 (L) 08/17/2018 1316   AST 19 08/17/2018 1316   ALT 17 08/17/2018 1316   ALKPHOS 343 (H) 08/17/2018 1316   BILITOT 0.2 (L) 08/17/2018 1316   GFRNONAA >60 08/17/2018 1316   GFRAA >60 08/17/2018 1316       Component Value Date/Time   WBC 13.4 (H) 08/17/2018 1316   WBC 10.7 (H) 04/29/2018 0501   RBC 4.06 08/17/2018 1316   HGB 11.9 (L) 08/17/2018 1316   HCT 38.6 08/17/2018 1316   PLT 472 (H) 08/17/2018 1316   MCV 95.1 08/17/2018 1316   MCH 29.3 08/17/2018 1316   MCHC 30.8 08/17/2018 1316   RDW 14.9 08/17/2018 1316   LYMPHSABS 1.1 08/17/2018 1316   MONOABS 0.5 08/17/2018 1316   EOSABS 0.0 08/17/2018 1316   BASOSABS 0.0 08/17/2018 1316   -------------------------------  Imaging from last 24 hours (if applicable):  Radiology interpretation: No results found.

## 2018-09-03 ENCOUNTER — Encounter: Payer: Self-pay | Admitting: Medical

## 2018-09-04 ENCOUNTER — Telehealth: Payer: Self-pay | Admitting: Nurse Practitioner

## 2018-09-04 NOTE — Telephone Encounter (Signed)
Tried to reach patient to f/u on her mychart message request to refill antibiotic and prednisone. No answer. Will try her back tomorrow to discuss her symptoms.  Cira Rue, NP  09/04/2018

## 2018-09-05 ENCOUNTER — Other Ambulatory Visit: Payer: Self-pay | Admitting: Nurse Practitioner

## 2018-09-05 ENCOUNTER — Encounter: Payer: Self-pay | Admitting: Nurse Practitioner

## 2018-09-05 DIAGNOSIS — J441 Chronic obstructive pulmonary disease with (acute) exacerbation: Secondary | ICD-10-CM

## 2018-09-05 MED ORDER — PREDNISONE 5 MG PO TABS: ORAL_TABLET | ORAL | 0 refills | Status: DC

## 2018-09-05 MED ORDER — SULFAMETHOXAZOLE-TRIMETHOPRIM 800-160 MG PO TABS: 1.0000 | ORAL_TABLET | Freq: Two times a day (BID) | ORAL | 0 refills | Status: DC

## 2018-09-05 NOTE — Progress Notes (Signed)
I spoke to patient who continues to recover from COPD exacerbation. She completed bactrim and prednisone taper this morning. She still feels short winded, but better. Denies cough, fever. I will extend her therapy to complete 10 day course. She will take bactrim 1 tab BID for 3 more days and prednisone 5 mg daily x3 days. Prescriptions sent. She understands to call sooner if symptoms worsen or fail to improve. She has routine f/u and treatment on 6/18.  Yvonne Rue, NP  09/05/18

## 2018-09-07 ENCOUNTER — Inpatient Hospital Stay: Payer: BC Managed Care – PPO

## 2018-09-07 ENCOUNTER — Emergency Department (HOSPITAL_COMMUNITY): Payer: BC Managed Care – PPO

## 2018-09-07 ENCOUNTER — Other Ambulatory Visit: Payer: Self-pay

## 2018-09-07 ENCOUNTER — Encounter: Payer: Self-pay | Admitting: Nurse Practitioner

## 2018-09-07 ENCOUNTER — Telehealth: Payer: Self-pay | Admitting: *Deleted

## 2018-09-07 ENCOUNTER — Inpatient Hospital Stay (HOSPITAL_BASED_OUTPATIENT_CLINIC_OR_DEPARTMENT_OTHER): Payer: BC Managed Care – PPO | Admitting: Nurse Practitioner

## 2018-09-07 ENCOUNTER — Encounter (HOSPITAL_COMMUNITY): Payer: Self-pay

## 2018-09-07 ENCOUNTER — Inpatient Hospital Stay (HOSPITAL_COMMUNITY)
Admission: EM | Admit: 2018-09-07 | Discharge: 2018-09-14 | DRG: 193 | Disposition: A | Discharge status: Home / Self Care | Payer: BC Managed Care – PPO | Attending: Internal Medicine | Admitting: Internal Medicine

## 2018-09-07 VITALS — BP 108/76 | HR 100 | Temp 98.4°F | Resp 20 | Ht 67.0 in | Wt 172.7 lb

## 2018-09-07 DIAGNOSIS — C7989 Secondary malignant neoplasm of other specified sites: Secondary | ICD-10-CM | POA: Diagnosis present

## 2018-09-07 DIAGNOSIS — Z87891 Personal history of nicotine dependence: Secondary | ICD-10-CM | POA: Diagnosis not present

## 2018-09-07 DIAGNOSIS — C7971 Secondary malignant neoplasm of right adrenal gland: Secondary | ICD-10-CM

## 2018-09-07 DIAGNOSIS — T451X5A Adverse effect of antineoplastic and immunosuppressive drugs, initial encounter: Secondary | ICD-10-CM | POA: Diagnosis present

## 2018-09-07 DIAGNOSIS — J189 Pneumonia, unspecified organism: Secondary | ICD-10-CM | POA: Diagnosis present

## 2018-09-07 DIAGNOSIS — R0602 Shortness of breath: Secondary | ICD-10-CM | POA: Diagnosis not present

## 2018-09-07 DIAGNOSIS — E876 Hypokalemia: Secondary | ICD-10-CM

## 2018-09-07 DIAGNOSIS — F329 Major depressive disorder, single episode, unspecified: Secondary | ICD-10-CM

## 2018-09-07 DIAGNOSIS — Z1159 Encounter for screening for other viral diseases: Secondary | ICD-10-CM

## 2018-09-07 DIAGNOSIS — Z9884 Bariatric surgery status: Secondary | ICD-10-CM

## 2018-09-07 DIAGNOSIS — Z66 Do not resuscitate: Secondary | ICD-10-CM | POA: Diagnosis not present

## 2018-09-07 DIAGNOSIS — D72829 Elevated white blood cell count, unspecified: Secondary | ICD-10-CM | POA: Diagnosis not present

## 2018-09-07 DIAGNOSIS — C3431 Malignant neoplasm of lower lobe, right bronchus or lung: Secondary | ICD-10-CM

## 2018-09-07 DIAGNOSIS — Z79899 Other long term (current) drug therapy: Secondary | ICD-10-CM

## 2018-09-07 DIAGNOSIS — F419 Anxiety disorder, unspecified: Secondary | ICD-10-CM | POA: Diagnosis not present

## 2018-09-07 DIAGNOSIS — C3490 Malignant neoplasm of unspecified part of unspecified bronchus or lung: Secondary | ICD-10-CM | POA: Diagnosis not present

## 2018-09-07 DIAGNOSIS — D6481 Anemia due to antineoplastic chemotherapy: Secondary | ICD-10-CM | POA: Diagnosis present

## 2018-09-07 DIAGNOSIS — I2699 Other pulmonary embolism without acute cor pulmonale: Secondary | ICD-10-CM | POA: Diagnosis not present

## 2018-09-07 DIAGNOSIS — J440 Chronic obstructive pulmonary disease with acute lower respiratory infection: Secondary | ICD-10-CM | POA: Diagnosis not present

## 2018-09-07 DIAGNOSIS — J441 Chronic obstructive pulmonary disease with (acute) exacerbation: Secondary | ICD-10-CM | POA: Diagnosis not present

## 2018-09-07 DIAGNOSIS — R918 Other nonspecific abnormal finding of lung field: Secondary | ICD-10-CM | POA: Diagnosis not present

## 2018-09-07 DIAGNOSIS — R0902 Hypoxemia: Secondary | ICD-10-CM

## 2018-09-07 DIAGNOSIS — Z923 Personal history of irradiation: Secondary | ICD-10-CM

## 2018-09-07 DIAGNOSIS — Z885 Allergy status to narcotic agent status: Secondary | ICD-10-CM

## 2018-09-07 DIAGNOSIS — J181 Lobar pneumonia, unspecified organism: Secondary | ICD-10-CM

## 2018-09-07 DIAGNOSIS — J9601 Acute respiratory failure with hypoxia: Secondary | ICD-10-CM

## 2018-09-07 DIAGNOSIS — Z20828 Contact with and (suspected) exposure to other viral communicable diseases: Secondary | ICD-10-CM | POA: Diagnosis not present

## 2018-09-07 DIAGNOSIS — D899 Disorder involving the immune mechanism, unspecified: Secondary | ICD-10-CM | POA: Diagnosis present

## 2018-09-07 DIAGNOSIS — Z7952 Long term (current) use of systemic steroids: Secondary | ICD-10-CM

## 2018-09-07 DIAGNOSIS — J90 Pleural effusion, not elsewhere classified: Secondary | ICD-10-CM | POA: Diagnosis present

## 2018-09-07 DIAGNOSIS — Z825 Family history of asthma and other chronic lower respiratory diseases: Secondary | ICD-10-CM

## 2018-09-07 DIAGNOSIS — Z9221 Personal history of antineoplastic chemotherapy: Secondary | ICD-10-CM

## 2018-09-07 DIAGNOSIS — K219 Gastro-esophageal reflux disease without esophagitis: Secondary | ICD-10-CM | POA: Diagnosis present

## 2018-09-07 DIAGNOSIS — R0682 Tachypnea, not elsewhere classified: Secondary | ICD-10-CM | POA: Diagnosis not present

## 2018-09-07 DIAGNOSIS — Z95828 Presence of other vascular implants and grafts: Secondary | ICD-10-CM

## 2018-09-07 DIAGNOSIS — F411 Generalized anxiety disorder: Secondary | ICD-10-CM | POA: Diagnosis not present

## 2018-09-07 DIAGNOSIS — Y95 Nosocomial condition: Secondary | ICD-10-CM | POA: Diagnosis present

## 2018-09-07 DIAGNOSIS — K589 Irritable bowel syndrome without diarrhea: Secondary | ICD-10-CM | POA: Diagnosis present

## 2018-09-07 DIAGNOSIS — C7970 Secondary malignant neoplasm of unspecified adrenal gland: Secondary | ICD-10-CM | POA: Diagnosis not present

## 2018-09-07 LAB — CBC
HCT: 35.6 % — ABNORMAL LOW (ref 36.0–46.0)
Hemoglobin: 11.1 g/dL — ABNORMAL LOW (ref 12.0–15.0)
MCH: 29.7 pg (ref 26.0–34.0)
MCHC: 31.2 g/dL (ref 30.0–36.0)
MCV: 95.2 fL (ref 80.0–100.0)
Platelets: 362 10*3/uL (ref 150–400)
RBC: 3.74 MIL/uL — ABNORMAL LOW (ref 3.87–5.11)
RDW: 14.3 % (ref 11.5–15.5)
WBC: 21.1 10*3/uL — ABNORMAL HIGH (ref 4.0–10.5)
nRBC: 0 % (ref 0.0–0.2)

## 2018-09-07 LAB — CMP (CANCER CENTER ONLY)
ALT: 38 U/L (ref 0–44)
AST: 30 U/L (ref 15–41)
Albumin: 2.8 g/dL — ABNORMAL LOW (ref 3.5–5.0)
Alkaline Phosphatase: 282 U/L — ABNORMAL HIGH (ref 38–126)
Anion gap: 12 (ref 5–15)
BUN: 6 mg/dL (ref 6–20)
CO2: 23 mmol/L (ref 22–32)
Calcium: 8.6 mg/dL — ABNORMAL LOW (ref 8.9–10.3)
Chloride: 101 mmol/L (ref 98–111)
Creatinine: 0.65 mg/dL (ref 0.44–1.00)
GFR, Est AFR Am: >60 mL/min (ref >60)
GFR, Estimated: >60 mL/min (ref >60)
Glucose, Bld: 103 mg/dL — ABNORMAL HIGH (ref 70–99)
Potassium: 3 mmol/L — CL (ref 3.5–5.1)
Sodium: 136 mmol/L (ref 135–145)
Total Bilirubin: 0.3 mg/dL (ref 0.3–1.2)
Total Protein: 6.6 g/dL (ref 6.5–8.1)

## 2018-09-07 LAB — MAGNESIUM: Magnesium: 2.1 mg/dL (ref 1.7–2.4)

## 2018-09-07 LAB — CBC WITH DIFFERENTIAL (CANCER CENTER ONLY)
Abs Immature Granulocytes: 0.2 10*3/uL — ABNORMAL HIGH (ref 0.00–0.07)
Basophils Absolute: 0.1 10*3/uL (ref 0.0–0.1)
Basophils Relative: 0 %
Eosinophils Absolute: 0 10*3/uL (ref 0.0–0.5)
Eosinophils Relative: 0 %
HCT: 36.9 % (ref 36.0–46.0)
Hemoglobin: 11.7 g/dL — ABNORMAL LOW (ref 12.0–15.0)
Immature Granulocytes: 1 %
Lymphocytes Relative: 4 %
Lymphs Abs: 1.1 10*3/uL (ref 0.7–4.0)
MCH: 29.6 pg (ref 26.0–34.0)
MCHC: 31.7 g/dL (ref 30.0–36.0)
MCV: 93.4 fL (ref 80.0–100.0)
Monocytes Absolute: 2.3 10*3/uL — ABNORMAL HIGH (ref 0.1–1.0)
Monocytes Relative: 8 %
Neutro Abs: 25.3 10*3/uL — ABNORMAL HIGH (ref 1.7–7.7)
Neutrophils Relative %: 87 %
Platelet Count: 393 10*3/uL (ref 150–400)
RBC: 3.95 MIL/uL (ref 3.87–5.11)
RDW: 14.2 % (ref 11.5–15.5)
WBC Count: 28.9 10*3/uL — ABNORMAL HIGH (ref 4.0–10.5)
nRBC: 0 % (ref 0.0–0.2)

## 2018-09-07 LAB — CREATININE, SERUM
Creatinine, Ser: 0.52 mg/dL (ref 0.44–1.00)
GFR calc Af Amer: >60 mL/min (ref >60)
GFR calc non Af Amer: >60 mL/min (ref >60)

## 2018-09-07 LAB — PHOSPHORUS: Phosphorus: 2.9 mg/dL (ref 2.5–4.6)

## 2018-09-07 LAB — SARS CORONAVIRUS 2 BY RT PCR (HOSPITAL ORDER, PERFORMED IN ~~LOC~~ HOSPITAL LAB): SARS Coronavirus 2: NEGATIVE

## 2018-09-07 LAB — LACTIC ACID, PLASMA: Lactic Acid, Venous: 2.3 mmol/L (ref 0.5–1.9)

## 2018-09-07 LAB — APTT: aPTT: 28 seconds (ref 24–36)

## 2018-09-07 LAB — PROTIME-INR
INR: 0.9 (ref 0.8–1.2)
Prothrombin Time: 12.1 seconds (ref 11.4–15.2)

## 2018-09-07 MED ORDER — SODIUM CHLORIDE 0.9 % IV SOLN
INTRAVENOUS | Status: DC
  Administered 2018-09-07: 21:00:00 via INTRAVENOUS

## 2018-09-07 MED ORDER — VANCOMYCIN HCL 10 G IV SOLR
1500.0000 mg | Freq: Once | INTRAVENOUS | Status: AC
  Administered 2018-09-08: 1500 mg via INTRAVENOUS
  Filled 2018-09-07: qty 1500

## 2018-09-07 MED ORDER — ONDANSETRON HCL 4 MG PO TABS: 4.0000 mg | ORAL_TABLET | Freq: Four times a day (QID) | ORAL | Status: DC | PRN

## 2018-09-07 MED ORDER — MAGIC MOUTHWASH W/LIDOCAINE
5.0000 mL | Freq: Three times a day (TID) | ORAL | Status: DC | PRN
  Filled 2018-09-07: qty 5

## 2018-09-07 MED ORDER — LIDOCAINE-PRILOCAINE 2.5-2.5 % EX CREA: 1.0000 "application " | TOPICAL_CREAM | CUTANEOUS | Status: DC | PRN

## 2018-09-07 MED ORDER — PIPERACILLIN-TAZOBACTAM 3.375 G IVPB
3.3750 g | Freq: Three times a day (TID) | INTRAVENOUS | Status: DC
  Administered 2018-09-08 – 2018-09-14 (×20): 3.375 g via INTRAVENOUS
  Filled 2018-09-07 (×24): qty 50

## 2018-09-07 MED ORDER — NYSTATIN 100000 UNIT/ML MT SUSP
5.0000 mL | Freq: Three times a day (TID) | OROMUCOSAL | Status: DC
  Administered 2018-09-07 – 2018-09-14 (×18): 500000 [IU] via OROMUCOSAL
  Filled 2018-09-07 (×19): qty 5

## 2018-09-07 MED ORDER — MAGNESIUM CITRATE PO SOLN: 1.0000 | Freq: Once | ORAL | Status: DC | PRN

## 2018-09-07 MED ORDER — ACETAMINOPHEN 650 MG RE SUPP: 650.0000 mg | Freq: Four times a day (QID) | RECTAL | Status: DC | PRN

## 2018-09-07 MED ORDER — ONDANSETRON HCL 4 MG/2ML IJ SOLN: 4.0000 mg | Freq: Four times a day (QID) | INTRAMUSCULAR | Status: DC | PRN

## 2018-09-07 MED ORDER — ENOXAPARIN SODIUM 40 MG/0.4ML ~~LOC~~ SOLN
40.0000 mg | SUBCUTANEOUS | Status: DC
  Administered 2018-09-07 – 2018-09-13 (×7): 40 mg via SUBCUTANEOUS
  Filled 2018-09-07 (×7): qty 0.4

## 2018-09-07 MED ORDER — IOHEXOL 350 MG/ML SOLN
100.0000 mL | Freq: Once | INTRAVENOUS | Status: AC | PRN
  Administered 2018-09-07: 100 mL via INTRAVENOUS

## 2018-09-07 MED ORDER — ALTEPLASE 2 MG IJ SOLR
INTRAMUSCULAR | Status: AC
  Filled 2018-09-07: qty 2

## 2018-09-07 MED ORDER — ALBUTEROL SULFATE (2.5 MG/3ML) 0.083% IN NEBU
2.5000 mg | INHALATION_SOLUTION | Freq: Four times a day (QID) | RESPIRATORY_TRACT | Status: DC | PRN
  Administered 2018-09-08: 2.5 mg via RESPIRATORY_TRACT
  Filled 2018-09-07: qty 3

## 2018-09-07 MED ORDER — ALTEPLASE 2 MG IJ SOLR
2.0000 mg | Freq: Once | INTRAMUSCULAR | Status: AC | PRN
  Administered 2018-09-07: 13:00:00 2 mg
  Filled 2018-09-07: qty 2

## 2018-09-07 MED ORDER — PANTOPRAZOLE SODIUM 40 MG PO TBEC
40.0000 mg | DELAYED_RELEASE_TABLET | Freq: Every day | ORAL | Status: DC
  Administered 2018-09-07 – 2018-09-14 (×8): 40 mg via ORAL
  Filled 2018-09-07 (×8): qty 1

## 2018-09-07 MED ORDER — FLUOXETINE HCL 20 MG PO CAPS
80.0000 mg | ORAL_CAPSULE | Freq: Every day | ORAL | Status: DC
  Administered 2018-09-08 – 2018-09-14 (×7): 80 mg via ORAL
  Filled 2018-09-07 (×7): qty 4

## 2018-09-07 MED ORDER — SODIUM CHLORIDE 0.9 % IV SOLN
2.0000 g | INTRAVENOUS | Status: DC
  Administered 2018-09-07: 2 g via INTRAVENOUS
  Filled 2018-09-07: qty 20

## 2018-09-07 MED ORDER — SODIUM CHLORIDE 0.9 % IV SOLN
INTRAVENOUS | Status: DC
  Administered 2018-09-07: 10 mL/h via INTRAVENOUS

## 2018-09-07 MED ORDER — ROPINIROLE HCL 0.5 MG PO TABS
0.5000 mg | ORAL_TABLET | Freq: Every evening | ORAL | Status: DC | PRN
  Administered 2018-09-11 – 2018-09-13 (×2): 0.5 mg via ORAL
  Filled 2018-09-07 (×4): qty 1

## 2018-09-07 MED ORDER — HYDROCODONE-ACETAMINOPHEN 10-325 MG PO TABS
2.0000 | ORAL_TABLET | Freq: Four times a day (QID) | ORAL | Status: DC | PRN
  Administered 2018-09-07 – 2018-09-14 (×24): 2 via ORAL
  Filled 2018-09-07 (×24): qty 2

## 2018-09-07 MED ORDER — BISACODYL 5 MG PO TBEC: 5.0000 mg | DELAYED_RELEASE_TABLET | Freq: Every day | ORAL | Status: DC | PRN

## 2018-09-07 MED ORDER — IPRATROPIUM BROMIDE 0.02 % IN SOLN: 0.5000 mg | Freq: Four times a day (QID) | RESPIRATORY_TRACT | Status: DC | PRN

## 2018-09-07 MED ORDER — SUCRALFATE 1 G PO TABS
1.0000 g | ORAL_TABLET | Freq: Three times a day (TID) | ORAL | Status: DC
  Administered 2018-09-07 – 2018-09-14 (×27): 1 g via ORAL
  Filled 2018-09-07 (×27): qty 1

## 2018-09-07 MED ORDER — SENNOSIDES-DOCUSATE SODIUM 8.6-50 MG PO TABS: 1.0000 | ORAL_TABLET | Freq: Every evening | ORAL | Status: DC | PRN

## 2018-09-07 MED ORDER — POTASSIUM CHLORIDE CRYS ER 20 MEQ PO TBCR
40.0000 meq | EXTENDED_RELEASE_TABLET | Freq: Once | ORAL | Status: AC
  Administered 2018-09-07: 40 meq via ORAL
  Filled 2018-09-07: qty 2

## 2018-09-07 MED ORDER — SODIUM CHLORIDE (PF) 0.9 % IJ SOLN
INTRAMUSCULAR | Status: AC
  Filled 2018-09-07: qty 50

## 2018-09-07 MED ORDER — GABAPENTIN 300 MG PO CAPS
300.0000 mg | ORAL_CAPSULE | Freq: Every day | ORAL | Status: DC | PRN
  Administered 2018-09-08 – 2018-09-11 (×3): 300 mg via ORAL
  Filled 2018-09-07 (×3): qty 1

## 2018-09-07 MED ORDER — VANCOMYCIN HCL IN DEXTROSE 1-5 GM/200ML-% IV SOLN: 1000.0000 mg | Freq: Two times a day (BID) | INTRAVENOUS | Status: DC

## 2018-09-07 MED ORDER — ACETAMINOPHEN 325 MG PO TABS
650.0000 mg | ORAL_TABLET | Freq: Four times a day (QID) | ORAL | Status: DC | PRN
  Administered 2018-09-09: 650 mg via ORAL
  Filled 2018-09-07: qty 2

## 2018-09-07 MED ORDER — ZOLPIDEM TARTRATE 5 MG PO TABS
5.0000 mg | ORAL_TABLET | Freq: Every evening | ORAL | Status: DC | PRN
  Administered 2018-09-07: 5 mg via ORAL
  Filled 2018-09-07: qty 1

## 2018-09-07 MED ORDER — SODIUM CHLORIDE 0.9 % IV SOLN
500.0000 mg | INTRAVENOUS | Status: DC
  Administered 2018-09-07: 500 mg via INTRAVENOUS
  Filled 2018-09-07: qty 500

## 2018-09-07 MED ORDER — BUTALBITAL-APAP-CAFFEINE 50-325-40 MG PO TABS
1.0000 | ORAL_TABLET | Freq: Four times a day (QID) | ORAL | Status: DC | PRN
  Administered 2018-09-08 – 2018-09-09 (×2): 2 via ORAL
  Filled 2018-09-07 (×2): qty 2
  Filled 2018-09-07: qty 1

## 2018-09-07 MED ORDER — SODIUM CHLORIDE 0.9% FLUSH
10.0000 mL | INTRAVENOUS | Status: DC | PRN
  Administered 2018-09-07: 10 mL
  Filled 2018-09-07: qty 10

## 2018-09-07 NOTE — ED Notes (Addendum)
Attempted to call report to [facility], he will call back. Bedside report given to Tom while up on 5E.

## 2018-09-07 NOTE — ED Triage Notes (Signed)
Patient arrived via wheelchair from cancer center.   Patient arrived at cancer center with shob.   Patient states shob X 1 week. Last week chest xray performed and patient put on prednisone for COPD exacerbation. Patient took last prednisone today.   Patient used albuterol treatment last night at home.   Patient had blood work done today at cancer center-CBC and CMP.    Hx. Lung Cancer    Patient came in at 91% RA Patient placed on 2 Litters @ 95%-97%.    A/ox4

## 2018-09-07 NOTE — ED Notes (Signed)
Date and time results received: 09/07/18 6:58 PM   Test: Lactic Acid  Critical Value: 2.3  Name of Provider Notified: Zenia Resides, MD.

## 2018-09-07 NOTE — H&P (Signed)
History and Physical   [facility] [place] @ [place] Admission History and Physical [employer], D.O.    Patient Name: Yvonne Lang MR#: [id number] Date of Birth: 08-24-1965 Date of Admission: 09/07/2018  Referring MD/NP/PA: Dr. Zenia Resides Primary Care Physician: Redmond School, MD  Chief Complaint:  Chief Complaint  Patient presents with  . Shortness of Breath    HPI: Yvonne Lang is a 53 y.o. female with a known history of COPD, depression/anxiety, GERD, irritable bowel syndrome, lung cancer presents to the emergency department for evaluation of shortness of breath.  Patient was in a usual state of health until this afternoon when she was at her oncologist office for follow-up appointment and chemotherapy when she was noted to have 1 week history of increased shortness of breath, tachycardia, tachypnea and hypoxia.  Her O2 sat was around 91%.  She was recently treated with prednisone for a COPD exacerbation.  Her symptoms are refractory to albuterol treatments at home.  She did not end up receiving her chemotherapy today  Patient denies fevers/chills, weakness, dizziness, chest pain, N/V/C/D, abdominal pain, dysuria/frequency, changes in mental status.    Otherwise there has been no change in status. Patient has been taking medication as prescribed and there has been no recent change in medication or diet.  No recent antibiotics.  There has been no recent illness, hospitalizations, travel or sick contacts.    EMS/ED Course: Patient received Rocephin. Medical admission has been requested for further management of pneumonia.  Review of Systems:  CONSTITUTIONAL: No fever/chills, fatigue, weakness, weight gain/loss, headache. EYES: No blurry or double vision. ENT: No tinnitus, postnasal drip, redness or soreness of the oropharynx. RESPIRATORY: Positive cough, dyspnea, wheeze.  No hemoptysis.  CARDIOVASCULAR: No chest pain, palpitations, syncope, orthopnea. No  lower extremity edema.  GASTROINTESTINAL: No nausea, vomiting, abdominal pain, diarrhea, constipation.  No hematemesis, melena or hematochezia. GENITOURINARY: No dysuria, frequency, hematuria. ENDOCRINE: No polyuria or nocturia. No heat or cold intolerance. HEMATOLOGY: No anemia, bruising, bleeding. INTEGUMENTARY: No rashes, ulcers, lesions. MUSCULOSKELETAL: No arthritis, gout NEUROLOGIC: No numbness, tingling, ataxia, seizure-type activity, weakness. PSYCHIATRIC: No anxiety, depression, insomnia.   Past Medical History:  Diagnosis Date  . Anxiety   . Bronchitis   . COPD (chronic obstructive pulmonary disease) ([place])   . Depression   . GERD (gastroesophageal reflux disease)   . H/O gastric bypass    1999  . Irritable bowel syndrome   . Lung cancer [facility])     Past Surgical History:  Procedure Laterality Date  . BACK SURGERY    . CHOLECYSTECTOMY    . COLON SURGERY     for a kink in her colon and 12 inchs removed  . COLONOSCOPY N/A 12/13/2013   Procedure: COLONOSCOPY;  Surgeon: Rogene Houston, MD;  Location: [facility];  Service: Endoscopy;  Laterality: N/A;  200  . ESOPHAGOGASTRODUODENOSCOPY N/A 12/13/2013   Procedure: ESOPHAGOGASTRODUODENOSCOPY (EGD);  Surgeon: Rogene Houston, MD;  Location: [facility]ITE;  Service: Endoscopy;  Laterality: N/A;  . Gastric Bypas  2000  . IR IMAGING GUIDED PORT INSERTION  11/14/2017     reports that she has quit smoking. Her smoking use included cigarettes. She has a 40.00 pack-year smoking history. She has never used smokeless tobacco. She reports that she does not drink alcohol or use drugs.  Allergies  Allergen Reactions  . Codeine Other (See Comments)    Migraine headache    Family History  Problem Relation Age of Onset  . Other  Mother        has a pacemaker; rectal prolapse; rods in legs; lung fibrosis   . Other Father        heart gave out  . Hypertension Sister   . Bronchitis Sister   . Heart attack Paternal Grandmother   .  COPD Maternal Grandmother   . Emphysema Maternal Grandmother   . Alcoholism Maternal Grandfather   . Cancer Sister        rare skin cancer  . Cancer Maternal Uncle 7       colon cancer   . Colon cancer Maternal Uncle     Prior to Admission medications   Medication Sig Start Date End Date Taking? Authorizing Provider  albuterol (PROVENTIL HFA) 108 (90 Base) MCG/ACT inhaler Inhale 2 puffs into the lungs 2 (two) times daily. 02/10/18 02/10/19 Yes Truitt Merle, MD  ALPRAZolam Duanne Moron) 0.25 MG tablet Take 1 tablet (0.25 mg total) by mouth at bedtime as needed for anxiety or sleep. 08/02/18  Yes Alla Feeling, NP  butalbital-acetaminophen-caffeine (FIORICET, ESGIC) 50-325-40 MG tablet Take 1-2 tablets by mouth every 6 (six) hours as needed for headache. 01/19/18 01/19/19 Yes Alla Feeling, NP  CALCIUM-MAGNESIUM-ZINC PO Take 1 tablet by mouth daily.   Yes [provider]  dexamethasone (DECADRON) 4 MG tablet Take 2 tablets (8 mg total) by mouth 2 (two) times daily. Start the day before Taxotere, then take on day after chemo for 2 days 08/17/18  Yes Alla Feeling, NP  dicyclomine (BENTYL) 20 MG tablet Take 1 tablet (20 mg total) by mouth every 6 (six) hours. 10/04/17  Yes Truitt Merle, MD  FLUoxetine (PROZAC) 40 MG capsule Take 80 mg by mouth daily. 09/04/18  Yes [provider]  furosemide (LASIX) 20 MG tablet Take 1 tablet (20 mg total) by mouth daily as needed. Patient taking differently: Take 20 mg by mouth daily as needed for fluid or edema.  04/28/18  Yes Truitt Merle, MD  gabapentin (NEURONTIN) 300 MG capsule Take 300 mg by mouth daily as needed (for pain).  10/03/17  Yes [provider]  HYDROcodone-acetaminophen (NORCO) 10-325 MG tablet Take 2 tablets by mouth every 6 (six) hours as needed (For pain.).  05/10/18  Yes [provider]  ipratropium-albuterol (DUONEB) 0.5-2.5 (3) MG/3ML SOLN Take 3 mLs by nebulization every 4 (four) hours as needed. Patient taking  differently: Take 3 mLs by nebulization every 4 (four) hours as needed (for shortness of breath).  03/20/18  Yes Truitt Merle, MD  lidocaine-prilocaine (EMLA) cream APPLY 1 APPLICATION TO PORT TOPICALLY AS NEEDED ONE HOUR PRIOR TO PORT ACCESS/CHEMO Patient taking differently: Apply 1 application topically as needed (For port-a-cath.).  08/21/18  Yes Truitt Merle, MD  magic mouthwash w/lidocaine SOLN Take 5 mLs by mouth 3 (three) times daily as needed for mouth pain. 08/22/18  Yes Alla Feeling, NP  Multiple Vitamin (MULTIVITAMIN) tablet Take 1 tablet by mouth daily.   Yes [provider]  nystatin (MYCOSTATIN) 100000 UNIT/ML suspension SWISH ORALLY THEN SPIT WITH 5 ML THREE TIMES DAILY Patient taking differently: Use as directed 5 mLs in the mouth or throat 3 (three) times daily.  08/24/18  Yes Truitt Merle, MD  omeprazole (PRILOSEC) 20 MG capsule Take 1 capsule (20 mg total) by mouth 2 (two) times daily before a meal. 10/10/17  Yes Truitt Merle, MD  ondansetron (ZOFRAN) 8 MG tablet Take 1 tablet (8 mg total) by mouth every 8 (eight) hours as needed for nausea  or vomiting. 08/02/18  Yes Alla Feeling, NP  predniSONE (DELTASONE) 5 MG tablet Take 1 tablet daily for 3 days Patient taking differently: Take 5 mg by mouth daily.  09/05/18  Yes Alla Feeling, NP  prochlorperazine (COMPAZINE) 10 MG tablet Take 1 tablet (10 mg total) by mouth every 6 (six) hours as needed for nausea or vomiting. 06/26/18  Yes Truitt Merle, MD  rOPINIRole (REQUIP) 0.5 MG tablet Take 0.5 mg by mouth at bedtime as needed (For restless legs.).  11/04/17  Yes [provider]  sucralfate (CARAFATE) 1 g tablet TAKE 1 TABLET(1 GRAM) BY MOUTH FOUR TIMES DAILY Patient taking differently: Take 1 g by mouth 4 (four) times daily.  07/25/18  Yes Truitt Merle, MD  sulfamethoxazole-trimethoprim (BACTRIM DS) 800-160 MG tablet Take 1 tablet by mouth 2 (two) times daily. 09/05/18  Yes Alla Feeling, NP  TRELEGY ELLIPTA 100-62.5-25 MCG/INH AEPB Inhale  1 puff into the lungs daily.  10/27/17  Yes [provider]  zolpidem (AMBIEN) 10 MG tablet Take 10 mg by mouth at bedtime.  05/10/18  Yes [provider]    Physical Exam: Vitals:   09/07/18 1900 09/07/18 1949 09/07/18 1950 09/07/18 2039  BP: 122/85   119/78  Pulse: 92   (!) 104  Resp: (!) 25   19  Temp:    98.9 F (37.2 C)  TempSrc:    Oral  SpO2: 91% 94% 96% (!) 87%    GENERAL: 53 y.o.-year-old female patient, well-developed, well-nourished lying in the bed in no acute distress.  Pleasant and cooperative.   HEENT: Head atraumatic, normocephalic. Pupils equal. Mucus membranes moist. NECK: Supple. No JVD. CHEST: Normal breath sounds bilaterally. No wheezing, rales, rhonchi or crackles. No use of accessory muscles of respiration.  No reproducible chest wall tenderness.  CARDIOVASCULAR: S1, S2 normal. No murmurs, rubs, or gallops. Cap refill <2 seconds. Pulses intact distally.  ABDOMEN: Soft, nondistended, nontender. No rebound, guarding, rigidity. Normoactive bowel sounds present in all four quadrants.  EXTREMITIES: No pedal edema, cyanosis, or clubbing. No calf tenderness or Homan's sign.  NEUROLOGIC: The patient is alert and oriented x 3. Cranial nerves II through XII are grossly intact with no focal sensorimotor deficit. PSYCHIATRIC:  Normal affect, mood, thought content. SKIN: Warm, dry, and intact without obvious rash, lesion, or ulcer.    Labs on Admission:  CBC: Recent Labs  Lab 09/07/18 1250 09/07/18 2106  WBC 28.9* 21.1*  NEUTROABS 25.3*  --   HGB 11.7* 11.1*  HCT 36.9 35.6*  MCV 93.4 95.2  PLT 393 540   Basic Metabolic Panel: Recent Labs  Lab 09/07/18 1250 09/07/18 2106  NA 136  --   K 3.0*  --   CL 101  --   CO2 23  --   GLUCOSE 103*  --   BUN 6  --   CREATININE 0.65 0.52  CALCIUM 8.6*  --   MG  --  2.1  PHOS  --  2.9   GFR: Estimated Creatinine Clearance: 87.7 mL/min (by C-G formula based on SCr of 0.52 mg/dL). Liver Function  Tests: Recent Labs  Lab 09/07/18 1250  AST 30  ALT 38  ALKPHOS 282*  BILITOT 0.3  PROT 6.6  ALBUMIN 2.8*   No results for input(s): LIPASE, AMYLASE in the last 168 hours. No results for input(s): AMMONIA in the last 168 hours. Coagulation Profile: Recent Labs  Lab 09/07/18 1555  INR 0.9   Cardiac Enzymes: No results for input(s):  CKTOTAL, CKMB, CKMBINDEX, TROPONINI in the last 168 hours. BNP (last 3 results) No results for input(s): PROBNP in the last 8760 hours. HbA1C: No results for input(s): HGBA1C in the last 72 hours. CBG: No results for input(s): GLUCAP in the last 168 hours. Lipid Profile: No results for input(s): CHOL, HDL, LDLCALC, TRIG, CHOLHDL, LDLDIRECT in the last 72 hours. Thyroid Function Tests: No results for input(s): TSH, T4TOTAL, FREET4, T3FREE, THYROIDAB in the last 72 hours. Anemia Panel: No results for input(s): VITAMINB12, FOLATE, FERRITIN, TIBC, IRON, RETICCTPCT in the last 72 hours. Urine analysis:    Component Value Date/Time   PROTEINUR NEGATIVE 08/17/2018 1301   Sepsis Labs: @LABRCNTIP (procalcitonin:4,lacticidven:4) ) Recent Results (from the past 240 hour(s))  SARS Coronavirus 2 (CEPHEID - Performed in [facility] hospital lab), Hosp Order     Status: None   Collection Time: 09/07/18  3:56 PM   Specimen: Nasopharyngeal Swab  Result Value Ref Range Status   SARS Coronavirus 2 NEGATIVE NEGATIVE Final    Comment: (NOTE) If result is NEGATIVE SARS-CoV-2 target nucleic acids are NOT DETECTED. The SARS-CoV-2 RNA is generally detectable in upper and lower  respiratory specimens during the acute phase of infection. The lowest  concentration of SARS-CoV-2 viral copies this assay can detect is 250  copies / mL. A negative result does not preclude SARS-CoV-2 infection  and should not be used as the sole basis for treatment or other  patient management decisions.  A negative result may occur with  improper specimen collection / handling,  submission of specimen other  than nasopharyngeal swab, presence of viral mutation(s) within the  areas targeted by this assay, and inadequate number of viral copies  (<250 copies / mL). A negative result must be combined with clinical  observations, patient history, and epidemiological information. If result is POSITIVE SARS-CoV-2 target nucleic acids are DETECTED. The SARS-CoV-2 RNA is generally detectable in upper and lower  respiratory specimens dur ing the acute phase of infection.  Positive  results are indicative of active infection with SARS-CoV-2.  Clinical  correlation with patient history and other diagnostic information is  necessary to determine patient infection status.  Positive results do  not rule out bacterial infection or co-infection with other viruses. If result is PRESUMPTIVE POSTIVE SARS-CoV-2 nucleic acids MAY BE PRESENT.   A presumptive positive result was obtained on the submitted specimen  and confirmed on repeat testing.  While 2019 novel coronavirus  (SARS-CoV-2) nucleic acids may be present in the submitted sample  additional confirmatory testing may be necessary for epidemiological  and / or clinical management purposes  to differentiate between  SARS-CoV-2 and other Sarbecovirus currently known to infect humans.  If clinically indicated additional testing with an alternate test  methodology [phone]) is advised. The SARS-CoV-2 RNA is generally  detectable in upper and lower respiratory sp ecimens during the acute  phase of infection. The expected result is Negative. Fact Sheet for Patients:  StrictlyIdeas.no Fact Sheet for Healthcare Providers: BankingDealers.co.za This test is not yet approved or cleared by the [place] FDA and has been authorized for detection and/or diagnosis of SARS-CoV-2 by FDA under an Emergency Use Authorization (EUA).  This EUA will remain in effect (meaning this test can be  used) for the duration of the COVID-19 declaration under Section 564(b)(1) of the Act, 21 U.S.C. section 360bbb-3(b)(1), unless the authorization is terminated or revoked sooner. Performed at [facility], [place] [address] [place], [place] [address]  Radiological Exams on Admission: Dg Chest 2 View  Result Date: 09/07/2018 CLINICAL DATA:  Shortness of breath EXAM: CHEST - 2 VIEW COMPARISON:  08/29/2018, CT 07/21/2018 FINDINGS: Right-sided central venous port tip over the cavoatrial region. Right parahilar architectural distortion with cavitary lesion. Slight increased ground-glass opacity at the periphery. New ground-glass opacity within the lingula and left lung base. Stable heart size. No pneumothorax. IMPRESSION: 1. New ground-glass opacity within the lingula, left base and right upper lobe, suspect for multifocal infection, to include possible atypical or viral pneumonia. 2. Right hilar/perihilar architectural distortion with cavitary lesion as noted on previous CT examinations. Electronically Signed   By: Donavan Foil M.D.   On: 09/07/2018 16:02   Ct Angio Chest Pe W/cm &/or Wo Cm  Result Date: 09/07/2018 CLINICAL DATA:  Lung cancer.  Shortness of breath. EXAM: CT ANGIOGRAPHY CHEST WITH CONTRAST TECHNIQUE: Multidetector CT imaging of the chest was performed using the standard protocol during bolus administration of intravenous contrast. Multiplanar CT image reconstructions and MIPs were obtained to evaluate the vascular anatomy. CONTRAST:  179mL OMNIPAQUE IOHEXOL 350 MG/ML SOLN COMPARISON:  CT dated 04/28/2018. CT from Jul 21, 2018 could not be retrieved for comparison. FINDINGS: Cardiovascular: There is a well-positioned right-sided Port-A-Cath. Evaluation for pulmonary emboli is severely limited by motion artifact and suboptimal contrast bolus timing. The main pulmonary artery as well as the left and right pulmonary arteries are dilated. The heart size is normal. There is  no significant pericardial effusion. Coronary artery calcifications are noted. Mediastinum/Nodes: Again noted are pathologically enlarged mediastinal lymph nodes. There is a right superior paratracheal lymph node measuring approximately 1.6 cm (previously measuring 1.3 cm. There is an interval increase in size of multiple left AP window lymph nodes when compared to CT from February 2020. There are pathologically enlarged right hilar lymph nodes. Amorphous soft tissues again noted in the right perihilar region. There are no pathologically enlarged axillary lymph nodes. No pathologically enlarged supraclavicular lymph nodes. Lungs/Pleura: There are ground-glass airspace opacities involving the left upper, left lower, and right upper lobes. There is a 4.2 cm cavitary air-filled structure in the right lower lobe. There is improved aeration within the right middle lobe when compared to CT from April 23, 2018. There is a small right-sided pleural effusion. Upper Abdomen: Again noted is a right adrenal mass measuring approximately 3.1 x 3 cm. The left adrenal gland is only partially visualized and is unremarkable. There is mild intrahepatic biliary ductal dilatation which appears stable from prior studies. There are surgical clips near the GE junction. 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 Musculoskeletal: There is a growing soft tissue nodule in the anterior midline chest measuring approximately 1.5 cm (axial series 4, image 73). There is no acute displaced fracture. Review of the MIP images confirms the above findings. IMPRESSION: 1. Detection of pulmonary emboli is severely limited by motion artifact and suboptimal contrast bolus timing. Given this limitation, no PE was identified. The pulmonary arteries are dilated which can be seen in patients with elevated pulmonary artery pressures. 2. Diffuse ground-glass airspace opacities involving the right upper lobe, left upper lobe, and left lower  lobe. Findings are concerning for multifocal pneumonia (viral or bacterial). 3. Persistent ill-defined soft tissue in the right perihilar region with multiple pathologically enlarged mediastinal and hilar lymph nodes. There is a growing air-filled cavitary area within the right perihilar region which may be related to post treatment changes. 4. Small right-sided pleural effusion. 5. Growing subcutaneous soft  tissue nodule in the anterior midline left chest wall as detailed above. This could represent a metastatic deposit. Again noted is a right adrenal metastatic lesion as detailed above. Aortic Atherosclerosis (ICD10-I70.0). Electronically Signed   By: Constance Holster M.D.   On: 09/07/2018 17:08    Assessment/Plan  This is a 53 y.o. female with a history of COPD, depression/anxiety, GERD, irritable bowel syndrome, metastatic non-small cell lung cancer now being admitted with:  #. HCAP - Admit to inpatient - IV  Zosyn/Vanco per pharmacy - IV fluid hydration - Duonebs, expectorants & O2 therapy as needed - Follow up blood & sputum cultures - Check flu and viral respiratory panel. -Follow-up incidental findings on CTA of the chest as above  #. Mild hypokalemia  - Replace orally - Check mag level  #.  History of depression and anxiety -Continue Prozac -Hold Ativan and Ambien  #. History of GERD - Continue Protonix or Prilosec  Admission status: Inpatient IV Fluids: Normal saline Diet/Nutrition: Heart healthy Consults called: None DVT Px: Lovenox  and early ambulation. Code Status: Full Code  Disposition Plan: To home in 1-2 days  All the records are reviewed and case discussed with ED provider. Management plans discussed with the patient and/or family who express understanding and agree with plan of care.  Yonathan Perrow D.O. on 09/07/2018 at 10:12 PM CC: Primary care physician; Redmond School, MD   09/07/2018, 10:12 PM

## 2018-09-07 NOTE — ED Notes (Signed)
Patient transported to X-ray 

## 2018-09-07 NOTE — ED Notes (Signed)
ED TO INPATIENT HANDOFF REPORT  ED Nurse Name and Phone #: [place] G, [phone]  S Name/Age/Gender Yvonne Lang 53 y.o. female Room/Bed: WA20/WA20  Code Status   Code Status: Prior  Home/SNF/Other Home Patient oriented to: self, place, time and situation Is this baseline? Yes   Triage Complete: Triage complete  Chief Complaint SOB  Triage Note Patient arrived via wheelchair from cancer center.   Patient arrived at cancer center with shob.   Patient states shob X 1 week. Last week chest xray performed and patient put on prednisone for COPD exacerbation. Patient took last prednisone today.   Patient used albuterol treatment last night at home.   Patient had blood work done today at cancer center-CBC and CMP.    Hx. Lung Cancer    Patient came in at 91% RA Patient placed on 2 Litters @ 95%-97%.    A/ox4    Allergies Allergies  Allergen Reactions  . Codeine Other (See Comments)    Migraine headache    Level of Care/Admitting Diagnosis ED Disposition    ED Disposition Condition Comment   Admit  Hospital Area: [place] [[id number]]  Level of Care: Med-Surg [16]  Covid Evaluation: Confirmed COVID Negative  Diagnosis: CAP (community acquired pneumonia) [[id number]]  Admitting Physician: Harvie Bridge [[id number]]  Attending Physician: Sherron Monday  Estimated length of stay: 3 - 4 days  Certification:: I certify this patient will need inpatient services for at least 2 midnights  PT Class (Do Not Modify): Inpatient [101]  PT Acc Code (Do Not Modify): Private [1]       B Medical/Surgery History Past Medical History:  Diagnosis Date  . Anxiety   . Bronchitis   . COPD (chronic obstructive pulmonary disease) ([place])   . Depression   . GERD (gastroesophageal reflux disease)   . H/O gastric bypass    1999  . Irritable bowel syndrome   . Lung cancer [facility])    Past Surgical History:  Procedure Laterality Date  . BACK  SURGERY    . CHOLECYSTECTOMY    . COLON SURGERY     for a kink in her colon and 12 inchs removed  . COLONOSCOPY N/A 12/13/2013   Procedure: COLONOSCOPY;  Surgeon: Rogene Houston, MD;  [facility];  Service: Endoscopy;  Laterality: N/A;  200  . ESOPHAGOGASTRODUODENOSCOPY N/A 12/13/2013   Procedure: ESOPHAGOGASTRODUODENOSCOPY (EGD);  Surgeon: Rogene Houston, MD;  Location: [facility];  Service: Endoscopy;  Laterality: N/A;  . Gastric Bypas  2000  . IR IMAGING GUIDED PORT INSERTION  11/14/2017     A IV Location/Drains/Wounds Patient Lines/Drains/Airways Status   Active Line/Drains/Airways    Name:   Placement date:   Placement time:   Site:   Days:   Implanted Port 11/14/17 Right Chest   11/14/17    1211    Chest   297   Implanted Port 09/07/18 Right Chest   09/07/18    1815    Chest   less than 1   Peripheral IV 09/07/18 Left Antecubital   09/07/18    1553    Antecubital   less than 1   Peripheral IV 09/07/18 Right Antecubital   09/07/18    1615    Antecubital   less than 1          Intake/Output Last 24 hours  Intake/Output Summary (Last 24 hours) at 09/07/2018 1916 Last data filed at 09/07/2018 1853 Gross per 24 hour  Intake 100  ml  Output -  Net 100 ml    Labs/Imaging Results for orders placed or performed during the hospital encounter of 09/07/18 (from the past 48 hour(s))  Protime-INR     Status: None   Collection Time: 09/07/18  3:55 PM  Result Value Ref Range   Prothrombin Time 12.1 11.4 - 15.2 seconds   INR 0.9 0.8 - 1.2    Comment: (NOTE) INR goal varies based on device and disease states. Performed at [facility], [place] [address], [place], [place] [address]   APTT     Status: None   Collection Time: 09/07/18  3:55 PM  Result Value Ref Range   aPTT 28 24 - 36 seconds    Comment: Performed at [facility], [place] [address], [place], [place] [address]  SARS Coronavirus 2 (CEPHEID - Performed in [place]  hospital lab), Hosp Order     Status: None   Collection Time: 09/07/18  3:56 PM   Specimen: Nasopharyngeal Swab  Result Value Ref Range   SARS Coronavirus 2 NEGATIVE NEGATIVE    Comment: (NOTE) If result is NEGATIVE SARS-CoV-2 target nucleic acids are NOT DETECTED. The SARS-CoV-2 RNA is generally detectable in upper and lower  respiratory specimens during the acute phase of infection. The lowest  concentration of SARS-CoV-2 viral copies this assay can detect is 250  copies / mL. A negative result does not preclude SARS-CoV-2 infection  and should not be used as the sole basis for treatment or other  patient management decisions.  A negative result may occur with  improper specimen collection / handling, submission of specimen other  than nasopharyngeal swab, presence of viral mutation(s) within the  areas targeted by this assay, and inadequate number of viral copies  (<250 copies / mL). A negative result must be combined with clinical  observations, patient history, and epidemiological information. If result is POSITIVE SARS-CoV-2 target nucleic acids are DETECTED. The SARS-CoV-2 RNA is generally detectable in upper and lower  respiratory specimens dur ing the acute phase of infection.  Positive  results are indicative of active infection with SARS-CoV-2.  Clinical  correlation with patient history and other diagnostic information is  necessary to determine patient infection status.  Positive results do  not rule out bacterial infection or co-infection with other viruses. If result is PRESUMPTIVE POSTIVE SARS-CoV-2 nucleic acids MAY BE PRESENT.   A presumptive positive result was obtained on the submitted specimen  and confirmed on repeat testing.  While 2019 novel coronavirus  (SARS-CoV-2) nucleic acids may be present in the submitted sample  additional confirmatory testing may be necessary for epidemiological  and / or clinical management purposes  to differentiate between   SARS-CoV-2 and other Sarbecovirus currently known to infect humans.  If clinically indicated additional testing with an alternate test  methodology [phone]) is advised. The SARS-CoV-2 RNA is generally  detectable in upper and lower respiratory sp ecimens during the acute  phase of infection. The expected result is Negative. Fact Sheet for Patients:  StrictlyIdeas.no Fact Sheet for Healthcare Providers: BankingDealers.co.za This test is not yet approved or cleared by the [place] FDA and has been authorized for detection and/or diagnosis of SARS-CoV-2 by FDA under an Emergency Use Authorization (EUA).  This EUA will remain in effect (meaning this test can be used) for the duration of the COVID-19 declaration under Section 564(b)(1) of the Act, 21 U.S.C. section 360bbb-3(b)(1), unless the authorization is terminated or revoked sooner. Performed at [facility]  [place] [address], [place], [place] [address]   Lactic acid, plasma     Status: Abnormal   Collection Time: 09/07/18  6:13 PM  Result Value Ref Range   Lactic Acid, Venous 2.3 (HH) 0.5 - 1.9 mmol/L    Comment: CRITICAL RESULT CALLED TO, READ BACK BY AND VERIFIED WITH: L.VAUGHN AT 1856 ON 09/07/18 BY N.THOMPSON Performed at [facility], [place] [address], [place], [place] [address]    Dg Chest 2 View  Result Date: 09/07/2018 CLINICAL DATA:  Shortness of breath EXAM: CHEST - 2 VIEW COMPARISON:  08/29/2018, CT 07/21/2018 FINDINGS: Right-sided central venous port tip over the cavoatrial region. Right parahilar architectural distortion with cavitary lesion. Slight increased ground-glass opacity at the periphery. New ground-glass opacity within the lingula and left lung base. Stable heart size. No pneumothorax. IMPRESSION: 1. New ground-glass opacity within the lingula, left base and right upper lobe, suspect for multifocal infection, to include  possible atypical or viral pneumonia. 2. Right hilar/perihilar architectural distortion with cavitary lesion as noted on previous CT examinations. Electronically Signed   By: Donavan Foil M.D.   On: 09/07/2018 16:02   Ct Angio Chest Pe W/cm &/or Wo Cm  Result Date: 09/07/2018 CLINICAL DATA:  Lung cancer.  Shortness of breath. EXAM: CT ANGIOGRAPHY CHEST WITH CONTRAST TECHNIQUE: Multidetector CT imaging of the chest was performed using the standard protocol during bolus administration of intravenous contrast. Multiplanar CT image reconstructions and MIPs were obtained to evaluate the vascular anatomy. CONTRAST:  122mL OMNIPAQUE IOHEXOL 350 MG/ML SOLN COMPARISON:  CT dated 04/28/2018. CT from Jul 21, 2018 could not be retrieved for comparison. FINDINGS: Cardiovascular: There is a well-positioned right-sided Port-A-Cath. Evaluation for pulmonary emboli is severely limited by motion artifact and suboptimal contrast bolus timing. The main pulmonary artery as well as the left and right pulmonary arteries are dilated. The heart size is normal. There is no significant pericardial effusion. Coronary artery calcifications are noted. Mediastinum/Nodes: Again noted are pathologically enlarged mediastinal lymph nodes. There is a right superior paratracheal lymph node measuring approximately 1.6 cm (previously measuring 1.3 cm. There is an interval increase in size of multiple left AP window lymph nodes when compared to CT from February 2020. There are pathologically enlarged right hilar lymph nodes. Amorphous soft tissues again noted in the right perihilar region. There are no pathologically enlarged axillary lymph nodes. No pathologically enlarged supraclavicular lymph nodes. Lungs/Pleura: There are ground-glass airspace opacities involving the left upper, left lower, and right upper lobes. There is a 4.2 cm cavitary air-filled structure in the right lower lobe. There is improved aeration within the right middle lobe when  compared to CT from April 23, 2018. There is a small right-sided pleural effusion. Upper Abdomen: Again noted is a right adrenal mass measuring approximately 3.1 x 3 cm. The left adrenal gland is only partially visualized and is unremarkable. There is mild intrahepatic biliary ductal dilatation which appears stable from prior studies. There are surgical clips near the GE junction. 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 Musculoskeletal: There is a growing soft tissue nodule in the anterior midline chest measuring approximately 1.5 cm (axial series 4, image 73). There is no acute displaced fracture. Review of the MIP images confirms the above findings. IMPRESSION: 1. Detection of pulmonary emboli is severely limited by motion artifact and suboptimal contrast bolus timing. Given this limitation, no PE was identified. The pulmonary arteries are dilated which can be seen in patients with  elevated pulmonary artery pressures. 2. Diffuse ground-glass airspace opacities involving the right upper lobe, left upper lobe, and left lower lobe. Findings are concerning for multifocal pneumonia (viral or bacterial). 3. Persistent ill-defined soft tissue in the right perihilar region with multiple pathologically enlarged mediastinal and hilar lymph nodes. There is a growing air-filled cavitary area within the right perihilar region which may be related to post treatment changes. 4. Small right-sided pleural effusion. 5. Growing subcutaneous soft tissue nodule in the anterior midline left chest wall as detailed above. This could represent a metastatic deposit. Again noted is a right adrenal metastatic lesion as detailed above. Aortic Atherosclerosis (ICD10-I70.0). Electronically Signed   By: Constance Holster M.D.   On: 09/07/2018 17:08    Pending Labs Unresulted Labs (From admission, onward)    Start     Ordered   09/07/18 1742  Culture, blood (Routine X 2) w Reflex to ID Panel  BLOOD CULTURE X 2,   R  (with STAT occurrences)     09/07/18 1741   Signed and Held  CBC  (enoxaparin (LOVENOX)    CrCl >/= 30 ml/min)  Once,   R    Comments: Baseline for enoxaparin therapy IF NOT ALREADY DRAWN.  Notify MD if PLT < 100 K.    Signed and Held   Signed and Held  Creatinine, serum  (enoxaparin (LOVENOX)    CrCl >/= 30 ml/min)  Once,   R    Comments: Baseline for enoxaparin therapy IF NOT ALREADY DRAWN.    Signed and Held   Signed and Held  Creatinine, serum  (enoxaparin (LOVENOX)    CrCl >/= 30 ml/min)  Weekly,   R    Comments: while on enoxaparin therapy    Signed and Held   Signed and Held  Magnesium  Add-on,   R     Signed and Held   Signed and Held  Phosphorus  Add-on,   R     Signed and Held   Signed and Occupational hygienist morning,   R     Signed and Held   Signed and Held  CBC  Tomorrow morning,   R     Signed and Held   Signed and Held  Culture, sputum-assessment  Once,   R    Question:  Patient immune status  Answer:  Immunocompromised   Signed and Held          Vitals/Pain Today's Vitals   09/07/18 1730 09/07/18 1800 09/07/18 1830 09/07/18 1900  BP: 125/85 128/88 112/80 122/85  Pulse: 92 96 91 92  Resp: (!) 26 (!) 26 (!) 25 (!) 25  Temp:      TempSrc:      SpO2: 93% 92% 90% 91%  PainSc:        Isolation Precautions No active isolations  Medications Medications  0.9 %  sodium chloride infusion (10 mL/hr Intravenous New Bag/Given 09/07/18 1554)  sodium chloride (PF) 0.9 % injection (has no administration in time range)  cefTRIAXone (ROCEPHIN) 2 g in sodium chloride 0.9 % 100 mL IVPB (0 g Intravenous Stopped 09/07/18 1853)  azithromycin (ZITHROMAX) 500 mg in sodium chloride 0.9 % 250 mL IVPB (500 mg Intravenous New Bag/Given 09/07/18 1857)  potassium chloride SA (K-DUR) CR tablet 40 mEq (40 mEq Oral Given 09/07/18 1554)  iohexol (OMNIPAQUE) 350 MG/ML injection 100 mL (100 mLs Intravenous Contrast Given 09/07/18 1620)    Mobility walks Low fall risk

## 2018-09-07 NOTE — ED Provider Notes (Signed)
[facility] DEPT Provider Note   CSN: [id number] Arrival date & time: 09/07/18  1445     History   Chief Complaint Chief Complaint  Patient presents with  . Shortness of Breath    HPI Yvonne Lang is a 53 y.o. female.     53 year old female who has a history of COPD as well as lung cancer presents with increasing dyspnea times several days.  No fever but has had some cough.  Has had worsening dyspnea exertion.  Denies any chest pain or chest pressure.  No recent history of blood loss.  Went to the cancer center today for treatment but was too dyspneic and will come here.  Denies any leg pain or swelling or prior history of DVT.     Past Medical History:  Diagnosis Date  . Anxiety   . Bronchitis   . COPD (chronic obstructive pulmonary disease) ([place])   . Depression   . GERD (gastroesophageal reflux disease)   . H/O gastric bypass    1999  . Irritable bowel syndrome   . Lung cancer [facility])     Patient Active Problem List   Diagnosis Date Noted  . Pleural effusion 04/29/2018  . Acute respiratory failure with hypoxia ([place]) 04/28/2018  . Daily headache 02/13/2018  . Port-A-Cath in place 12/22/2017  . Metastatic non-small cell lung cancer ([place]) 10/06/2017  . Encounter for gynecological examination with Papanicolaou smear of cervix 03/17/2017  . Hot flashes due to menopause 03/17/2017  . IBS (irritable bowel syndrome) 10/25/2013  . Diarrhea 10/25/2013  . Abdominal pain, chronic, epigastric 10/25/2013    Past Surgical History:  Procedure Laterality Date  . BACK SURGERY    . CHOLECYSTECTOMY    . COLON SURGERY     for a kink in her colon and 12 inchs removed  . COLONOSCOPY N/A 12/13/2013   Procedure: COLONOSCOPY;  Surgeon: Rogene Houston, MD;  [facility];  Service: Endoscopy;  Laterality: N/A;  200  . ESOPHAGOGASTRODUODENOSCOPY N/A 12/13/2013   Procedure: ESOPHAGOGASTRODUODENOSCOPY (EGD);  Surgeon: Rogene Houston, MD;   [facility];  Service: Endoscopy;  Laterality: N/A;  . Gastric Bypas  2000  . IR IMAGING GUIDED PORT INSERTION  11/14/2017     OB History    Gravida  0   Para  0   Term  0   Preterm  0   AB  0   Living  0     SAB  0   TAB  0   Ectopic  0   Multiple  0   Live Births  0            Home Medications    Prior to Admission medications   Medication Sig Start Date End Date Taking? Authorizing Provider  albuterol (PROVENTIL HFA) 108 (90 Base) MCG/ACT inhaler Inhale 2 puffs into the lungs 2 (two) times daily. 02/10/18 02/10/19 Yes Truitt Merle, MD  ALPRAZolam Duanne Moron) 0.25 MG tablet Take 1 tablet (0.25 mg total) by mouth at bedtime as needed for anxiety or sleep. 08/02/18  Yes Alla Feeling, NP  butalbital-acetaminophen-caffeine (FIORICET, ESGIC) 50-325-40 MG tablet Take 1-2 tablets by mouth every 6 (six) hours as needed for headache. 01/19/18 01/19/19 Yes Alla Feeling, NP  CALCIUM-MAGNESIUM-ZINC PO Take 1 tablet by mouth daily.   Yes [provider]  dexamethasone (DECADRON) 4 MG tablet Take 2 tablets (8 mg total) by mouth 2 (two) times daily. Start the day before Taxotere, then  take on day after chemo for 2 days 08/17/18  Yes Alla Feeling, NP  dicyclomine (BENTYL) 20 MG tablet Take 1 tablet (20 mg total) by mouth every 6 (six) hours. 10/04/17  Yes Truitt Merle, MD  FLUoxetine (PROZAC) 40 MG capsule Take 80 mg by mouth daily. 09/04/18  Yes [provider]  furosemide (LASIX) 20 MG tablet Take 1 tablet (20 mg total) by mouth daily as needed. Patient taking differently: Take 20 mg by mouth daily as needed for fluid or edema.  04/28/18  Yes Truitt Merle, MD  gabapentin (NEURONTIN) 300 MG capsule Take 300 mg by mouth daily as needed (for pain).  10/03/17  Yes [provider]  HYDROcodone-acetaminophen (NORCO) 10-325 MG tablet Take 2 tablets by mouth every 6 (six) hours as needed (For pain.).  05/10/18  Yes [provider]   ipratropium-albuterol (DUONEB) 0.5-2.5 (3) MG/3ML SOLN Take 3 mLs by nebulization every 4 (four) hours as needed. Patient taking differently: Take 3 mLs by nebulization every 4 (four) hours as needed (for shortness of breath).  03/20/18  Yes Truitt Merle, MD  lidocaine-prilocaine (EMLA) cream APPLY 1 APPLICATION TO PORT TOPICALLY AS NEEDED ONE HOUR PRIOR TO PORT ACCESS/CHEMO Patient taking differently: Apply 1 application topically as needed (For port-a-cath.).  08/21/18  Yes Truitt Merle, MD  magic mouthwash w/lidocaine SOLN Take 5 mLs by mouth 3 (three) times daily as needed for mouth pain. 08/22/18  Yes Alla Feeling, NP  Multiple Vitamin (MULTIVITAMIN) tablet Take 1 tablet by mouth daily.   Yes [provider]  nystatin (MYCOSTATIN) 100000 UNIT/ML suspension SWISH ORALLY THEN SPIT WITH 5 ML THREE TIMES DAILY Patient taking differently: Use as directed 5 mLs in the mouth or throat 3 (three) times daily.  08/24/18  Yes Truitt Merle, MD  omeprazole (PRILOSEC) 20 MG capsule Take 1 capsule (20 mg total) by mouth 2 (two) times daily before a meal. 10/10/17  Yes Truitt Merle, MD  ondansetron (ZOFRAN) 8 MG tablet Take 1 tablet (8 mg total) by mouth every 8 (eight) hours as needed for nausea or vomiting. 08/02/18  Yes Alla Feeling, NP  predniSONE (DELTASONE) 5 MG tablet Take 1 tablet daily for 3 days Patient taking differently: Take 5 mg by mouth daily.  09/05/18  Yes Alla Feeling, NP  prochlorperazine (COMPAZINE) 10 MG tablet Take 1 tablet (10 mg total) by mouth every 6 (six) hours as needed for nausea or vomiting. 06/26/18  Yes Truitt Merle, MD  rOPINIRole (REQUIP) 0.5 MG tablet Take 0.5 mg by mouth at bedtime as needed (For restless legs.).  11/04/17  Yes [provider]  sucralfate (CARAFATE) 1 g tablet TAKE 1 TABLET(1 GRAM) BY MOUTH FOUR TIMES DAILY Patient taking differently: Take 1 g by mouth 4 (four) times daily.  07/25/18  Yes Truitt Merle, MD  sulfamethoxazole-trimethoprim (BACTRIM DS) 800-160 MG  tablet Take 1 tablet by mouth 2 (two) times daily. 09/05/18  Yes Alla Feeling, NP  TRELEGY ELLIPTA 100-62.5-25 MCG/INH AEPB Inhale 1 puff into the lungs daily.  10/27/17  Yes [provider]  zolpidem (AMBIEN) 10 MG tablet Take 10 mg by mouth at bedtime.  05/10/18  Yes [provider]    Family History Family History  Problem Relation Age of Onset  . Other Mother        has a pacemaker; rectal prolapse; rods in legs; lung fibrosis   . Other Father        heart gave out  .  Hypertension Sister   . Bronchitis Sister   . Heart attack Paternal Grandmother   . COPD Maternal Grandmother   . Emphysema Maternal Grandmother   . Alcoholism Maternal Grandfather   . Cancer Sister        rare skin cancer  . Cancer Maternal Uncle 7       colon cancer   . Colon cancer Maternal Uncle     Social History Social History   Tobacco Use  . Smoking status: Former Smoker    Packs/day: 1.00    Years: 40.00    Pack years: 40.00    Types: Cigarettes  . Smokeless tobacco: Never Used  . Tobacco comment: Pack a day x 30 yrs. Trying to stop smoking using nicotine gum.   Substance Use Topics  . Alcohol use: No  . Drug use: No     Allergies   Codeine   Review of Systems Review of Systems  All other systems reviewed and are negative.    Physical Exam Updated Vital Signs BP (!) 122/91 (BP Location: Left Arm)   Pulse 96   Temp 97.9 F (36.6 C) (Oral)   Resp (!) 25   SpO2 96%   Physical Exam Vitals signs and nursing note reviewed.  Constitutional:      General: She is not in acute distress.    Appearance: Normal appearance. She is well-developed. She is not toxic-appearing.  HENT:     Head: Normocephalic and atraumatic.  Eyes:     General: Lids are normal.     Conjunctiva/sclera: Conjunctivae normal.     Pupils: Pupils are equal, round, and reactive to light.  Neck:     Musculoskeletal: Normal range of motion and neck supple.     Thyroid: No thyroid mass.      Trachea: No tracheal deviation.  Cardiovascular:     Rate and Rhythm: Normal rate and regular rhythm.     Heart sounds: Normal heart sounds. No murmur. No gallop.   Pulmonary:     Effort: Pulmonary effort is normal. Tachypnea present. No respiratory distress.     Breath sounds: No stridor. Examination of the right-upper field reveals decreased breath sounds and wheezing. Examination of the left-upper field reveals decreased breath sounds and wheezing. Decreased breath sounds and wheezing present. No rhonchi or rales.  Abdominal:     General: Bowel sounds are normal. There is no distension.     Palpations: Abdomen is soft.     Tenderness: There is no abdominal tenderness. There is no rebound.  Musculoskeletal: Normal range of motion.        General: No tenderness.  Skin:    General: Skin is warm and dry.     Findings: No abrasion or rash.  Neurological:     Mental Status: She is alert and oriented to person, place, and time.     GCS: GCS eye subscore is 4. GCS verbal subscore is 5. GCS motor subscore is 6.     Cranial Nerves: No cranial nerve deficit.     Sensory: No sensory deficit.  Psychiatric:        Speech: Speech normal.        Behavior: Behavior normal.      ED Treatments / Results  Labs (all labs ordered are listed, but only abnormal results are displayed) Labs Reviewed - No data to display  EKG None  Radiology No results found.  Procedures Procedures (including critical care time)  Medications Ordered in ED Medications  0.9 %  sodium chloride infusion (has no administration in time range)     Initial Impression / Assessment and Plan / ED Course  I have reviewed the triage vital signs and the nursing notes.  Pertinent labs & imaging results that were available during my care of the patient were reviewed by me and considered in my medical decision making (see chart for details).       Patient is COVID negative. Patient's chest CT consistent with  pneumonia.  Started on IV antibiotics and will be admitted to the hospitalist service. Final Clinical Impressions(s) / ED Diagnoses   Final diagnoses:  None    ED Discharge Orders    None       Lacretia Leigh, MD 09/07/18 1814

## 2018-09-07 NOTE — ED Notes (Signed)
Bed: WA20 Expected date:  Expected time:  Means of arrival:  Comments: Cancer center transfer-[place]

## 2018-09-07 NOTE — ED Notes (Signed)
Per cancer center, states patient has a history of lung cancer-states O2 88%-bringing patient to ED for work up

## 2018-09-07 NOTE — Telephone Encounter (Signed)
Received call report from [place].  "Today's K+ = 3.0."  Routing call encounter with results.  Noted patient station "ED" with call.

## 2018-09-07 NOTE — Progress Notes (Addendum)
Yvonne Lang   [phone] [phone]   Clinic Follow up Note   Patient Care Team: Redmond School, MD as PCP - General (Internal Medicine) 09/07/2018  CHIEF COMPLAINT: f/u metastatic NSCLC  SUMMARY OF ONCOLOGIC HISTORY: Oncology History Overview Note  Cancer Staging Metastatic non-small cell lung cancer [facility]) Staging form: Lung, AJCC 8th Edition - Clinical stage from 10/17/2017: Stage IV (cT3, cN1, cM1b) - Signed by Truitt Merle, MD on 10/17/2017     Metastatic non-small cell lung cancer ([place])  09/20/2017 Imaging   US Breast Left 09/20/17  IMPRESSION Indeterminte palpable mass measuring 3.2x1.7x2.8 cm  inferior medial to the inframammary fold of the left breast.     09/20/2017 Initial Biopsy   Diagnosis 09/20/17 Soft Tissue Needle Core Biopsy, inferior medial to IMF - ADENOCARCINOMA. - SEE MICROSCOPIC DESCRIPTION.  Microscopic Comment Immunohistochemistry will be performed and reported as an addendum. (JDP:ah 09/23/17) ADDENDUM: Immunohistochemistry shows the tumor is strongly positive with cytokeratin AE1/AE3, cytokeratin 7 and shows moderate weak positivity with CDX-2. The tumor is negative with estrogen receptor, progesterone receptor, GCDFP, GATA-3, Napsin A, TTF-1, WT-1 and cytokeratin 20. The immunophenotype is consistent with metastatic carcinoma. The combination of CDX-2 and cytokeratin 7 positivity raises the possibility of an upper gastrointestinal primary. Clinical correlation is essential.   10/06/2017 Initial Diagnosis   Metastatic adenocarcinoma involving soft tissue with unknown primary site [facility])   10/10/2017 Procedure   Upper Endoscopy by Dr. Silverio Decamp 10/10/17  IMPRESSION - Normal esophagus. - Z-line regular, 35 cm from the incisors. - Gastric bypass with a normal-sized pouch and intact staple line. Gastrojejunal anastomosis characterized by healthy appearing mucosa. - No specimens collected.   10/12/2017 Imaging   CT CAP WO  Contrast 10/12/17  IMPRESSION: 7 cm central right lung mass involving the right hilum, with postobstructive collapse of the right middle lobe, highly suspicious for primary bronchogenic carcinoma. 5 cm masslike opacity in the superior segment of the right lower lobe may represent carcinoma or postobstructive pneumonitis. 3.3 cm soft tissue mass in the lower anterior chest wall soft tissues, suspicious for metastatic disease. No evidence of abdominal or pelvic metastatic disease.    10/14/2017 Imaging   MRI Brain 10/14/17  IMPRESSION: 1. No metastatic disease or acute intracranial abnormality. Normal MRI appearance of the brain. 2. 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     10/17/2017 Cancer Staging   Staging form: Lung, AJCC 8th Edition - Clinical stage from 10/17/2017: Stage IV (cT3, cN1, cM1b) - Signed by Truitt Merle, MD on 10/17/2017   10/24/2017 - 11/04/2017 Radiation Therapy    The Right lung mass was treated to 30 Gy in 10 fractions of 3 Gy   11/08/2017 -  Chemotherapy   -first line chemo with carboplatin, paclitaxel, Atezolizumab and bevacizumab every 3weeks starting 11/08/17.  -Switched to maintenance therapy with avastin and atezolizumab q3weeks on 03/24/18   01/10/2018 Imaging   01/10/2018 CT CA IMPRESSION: 1. Response to therapy of central right lower lobe lung mass. Re-expansion of the right middle lobe with significantly improved right lower lobe postobstructive pneumonitis. Residual geographic airspace and ground-glass opacity within the medial right lung, primarily favored to be radiation induced. 2. Near complete resolution of presternal soft tissue mass. 3. New right adrenal nodule since 10/11/2017, suspicious for metastatic disease. 4. Right-sided Port-A-Cath with probable nonocclusive thrombus along the catheter within the right brachiocephalic vein. 5. Age advanced coronary artery atherosclerosis. Recommend assessment of coronary risk factors and  consideration of medical therapy. 6. Aortic  atherosclerosis (ICD10-I70.0) and emphysema (ICD10-J43.9).   03/10/2018 Imaging   CT CAP W Contrast 03/10/18 IMPRESSION: 1. Radiation changes involving the right paramediastinal lung and right hilum but no findings suspicious for residual or recurrent tumor. 2. No evidence of pulmonary metastatic disease. Stable emphysematous changes and areas of pulmonary scarring. 3. Stable 11 mm right adrenal gland nodule. 4. No findings for metastatic disease involving the liver or bony structures.   03/24/2018 - 04/24/2018 Chemotherapy   The patient had bevacizumab (AVASTIN) 1,300 mg in sodium chloride 0.9 % 100 mL chemo infusion, 15.5 mg/kg = 1,275 mg, Intravenous,  Once, 1 of 4 cycles Administration: 1,300 mg (03/24/2018) atezolizumab (TECENTRIQ) 1,200 mg in sodium chloride 0.9 % 250 mL chemo infusion, 1,200 mg, Intravenous, Once, 1 of 4 cycles Administration: 1,200 mg (03/24/2018)  for chemotherapy treatment.    04/11/2018 Imaging   CT ANGIO CHEST PE W OR WO CONTRAST  IMPRESSION: 1. No evidence of acute pulmonary embolism. 2. Progressive radiation changes in the right perihilar region. Cavitary retro hilar mass and right paratracheal lymph node have enlarged, suspicious for local recurrence/disease progression. 3. Enlarging right adrenal nodule suspicious for metastatic disease. 4. New right pleural effusion with increased atelectasis at both lung bases. 5. These results will be called to the ordering clinician or representative by the Radiology Department at the imaging location.    04/21/2018 Imaging   NUCLEAR MEDICINE PET SKULL BASE TO THIGH IMPRESSION: 1. Marked hypermetabolism in the amorphous soft tissue involving the right hilum. This represents the central component of the apparent radiation scarring. 2. Hypermetabolic mediastinal nodal metastases with nodal metastases identified in the anterior right juxta diaphragmatic fat. 3.  Hypermetabolic right adrenal metastasis. 4. Interval progression of loculated right pleural effusion.    05/02/2018 -  Chemotherapy   Second line chemo Alimta 500mg /m2 very 3 weeks    07/21/2018 Imaging   CT CAP 07/21/18  IMPRESSION: 1. Enlarging right adrenal metastatic lesion a mildly enlarging mediastinal adenopathy compatible with mild progression of disease. No new metastatic foci are identified. 2. Evolutionary findings along the right perihilar/paramediastinal radiation fibrosis including some improvement in aeration in the right upper lobe, but enlargement of the cavitary/centrally necrotic region in the superior segment right lower lobe, which is gas-filled and which probably connects to the otherwise focally occluded bronchus intermedius. 3. Other imaging findings of potential clinical significance: Probable left anterior descending coronary atherosclerosis. Mild to moderate intrahepatic biliary dilatation mild extrahepatic biliary dilatation, much of which may be a physiologic response to cholecystectomy. Prominent stool throughout the colon favors constipation. Aortic Atherosclerosis (ICD10-I70.0).   07/26/2018 -  Chemotherapy   The patient had pegfilgrastim-cbqv (UDENYCA) injection 6 mg, 6 mg, Subcutaneous, Once, 0 of 4 cycles DOCEtaxel (TAXOTERE) 140 mg in sodium chloride 0.9 % 250 mL chemo infusion, 75 mg/m2 = 140 mg, Intravenous,  Once, 2 of 6 cycles Administration: 140 mg (07/26/2018), 140 mg (08/17/2018) ramucirumab (CYRAMZA) 800 mg in sodium chloride 0.9 % 170 mL chemo infusion, 10 mg/kg = 800 mg, Intravenous, Once, 2 of 6 cycles Administration: 800 mg (07/26/2018), 800 mg (08/17/2018)  for chemotherapy treatment.      CURRENT THERAPY: Third line docetaxel and cyramza q3 weeks s/p cycle 1 on 07/26/18   INTERVAL HISTORY: Yvonne Lang returns for f/u and treatment as scheduled. She completed cycle 2 docetaxel and ramucirumab on 08/17/18. She was treated for COPD exacerbation  with prednisone taper and bactrim in the interval. She called earlier this week to report she was improving but still  had mild residual dyspnea. I refilled prednisone and antibiotic for additional 3 days. Over last 2 days at home she had increased shortness of breath and wheezing. No improvement with respiratory treatments at home. Cannot tolerate much activity due to fatigue and dyspnea. Denies h/o DVT or PE.   MEDICAL HISTORY:  Past Medical History:  Diagnosis Date   Anxiety    Bronchitis    COPD (chronic obstructive pulmonary disease) (HCC)    Depression    GERD (gastroesophageal reflux disease)    H/O gastric bypass    1999   Irritable bowel syndrome    Lung cancer ([place])     SURGICAL HISTORY: Past Surgical History:  Procedure Laterality Date   BACK SURGERY     CHOLECYSTECTOMY     COLON SURGERY     for a kink in her colon and 12 inchs removed   COLONOSCOPY N/A 12/13/2013   Procedure: COLONOSCOPY;  Surgeon: Rogene Houston, MD;  Location: [facility];  Service: Endoscopy;  Laterality: N/A;  200   ESOPHAGOGASTRODUODENOSCOPY N/A 12/13/2013   Procedure: ESOPHAGOGASTRODUODENOSCOPY (EGD);  Surgeon: Rogene Houston, MD;  Location: [facility];  Service: Endoscopy;  Laterality: N/A;   Gastric Bypas  2000   IR IMAGING GUIDED PORT INSERTION  11/14/2017    I have reviewed the social history and family history with the patient and they are unchanged from previous note.  ALLERGIES:  is allergic to codeine.  MEDICATIONS:  No current facility-administered medications for this visit.    Current Outpatient Medications  Medication Sig Dispense Refill   albuterol (PROVENTIL HFA) 108 (90 Base) MCG/ACT inhaler Inhale 2 puffs into the lungs 2 (two) times daily. 1 Inhaler 2   ALPRAZolam (XANAX) 0.25 MG tablet Take 1 tablet (0.25 mg total) by mouth at bedtime as needed for anxiety or sleep. 30 tablet 0   butalbital-acetaminophen-caffeine (FIORICET, ESGIC) 50-325-40 MG tablet  Take 1-2 tablets by mouth every 6 (six) hours as needed for headache. 60 tablet 1   CALCIUM-MAGNESIUM-ZINC PO Take 1 tablet by mouth daily.     dexamethasone (DECADRON) 4 MG tablet Take 2 tablets (8 mg total) by mouth 2 (two) times daily. Start the day before Taxotere, then take on day after chemo for 2 days 30 tablet 1   dicyclomine (BENTYL) 20 MG tablet Take 1 tablet (20 mg total) by mouth every 6 (six) hours. 60 tablet 1   FLUoxetine (PROZAC) 40 MG capsule Take 80 mg by mouth daily.     furosemide (LASIX) 20 MG tablet Take 1 tablet (20 mg total) by mouth daily as needed. (Patient taking differently: Take 20 mg by mouth daily as needed for fluid or edema. ) 30 tablet 0   gabapentin (NEURONTIN) 300 MG capsule Take 300 mg by mouth daily as needed (for pain).   4   HYDROcodone-acetaminophen (NORCO) 10-325 MG tablet Take 2 tablets by mouth every 6 (six) hours as needed (For pain.).      ipratropium-albuterol (DUONEB) 0.5-2.5 (3) MG/3ML SOLN Take 3 mLs by nebulization every 4 (four) hours as needed. (Patient taking differently: Take 3 mLs by nebulization every 4 (four) hours as needed (for shortness of breath). ) 360 mL 1   lidocaine-prilocaine (EMLA) cream APPLY 1 APPLICATION TO PORT TOPICALLY AS NEEDED ONE HOUR PRIOR TO PORT ACCESS/CHEMO (Patient taking differently: Apply 1 application topically as needed (For port-a-cath.). ) 30 g 0   magic mouthwash w/lidocaine SOLN Take 5 mLs by mouth 3 (three) times daily as needed  for mouth pain. 240 mL 0   Multiple Vitamin (MULTIVITAMIN) tablet Take 1 tablet by mouth daily.     nystatin (MYCOSTATIN) 100000 UNIT/ML suspension SWISH ORALLY THEN SPIT WITH 5 ML THREE TIMES DAILY (Patient taking differently: Use as directed 5 mLs in the mouth or throat 3 (three) times daily. ) 240 mL 0   omeprazole (PRILOSEC) 20 MG capsule Take 1 capsule (20 mg total) by mouth 2 (two) times daily before a meal. 60 capsule 0   ondansetron (ZOFRAN) 8 MG tablet Take 1 tablet  (8 mg total) by mouth every 8 (eight) hours as needed for nausea or vomiting. 30 tablet 2   predniSONE (DELTASONE) 5 MG tablet Take 1 tablet daily for 3 days (Patient taking differently: Take 5 mg by mouth daily. ) 21 tablet 0   prochlorperazine (COMPAZINE) 10 MG tablet Take 1 tablet (10 mg total) by mouth every 6 (six) hours as needed for nausea or vomiting. 30 tablet 3   rOPINIRole (REQUIP) 0.5 MG tablet Take 0.5 mg by mouth at bedtime as needed (For restless legs.).   11   sucralfate (CARAFATE) 1 g tablet TAKE 1 TABLET(1 GRAM) BY MOUTH FOUR TIMES DAILY (Patient taking differently: Take 1 g by mouth 4 (four) times daily. ) 120 tablet 0   sulfamethoxazole-trimethoprim (BACTRIM DS) 800-160 MG tablet Take 1 tablet by mouth 2 (two) times daily. 6 tablet 0   TRELEGY ELLIPTA 100-62.5-25 MCG/INH AEPB Inhale 1 puff into the lungs daily.   5   zolpidem (AMBIEN) 10 MG tablet Take 10 mg by mouth at bedtime.      Facility-Administered Medications Ordered in Other Visits  Medication Dose Route Frequency Provider Last Rate Last Dose   0.9 %  sodium chloride infusion   Intravenous Continuous Lacretia Leigh, MD 10 mL/hr at 09/07/18 1554 10 mL/hr at 09/07/18 1554   azithromycin (ZITHROMAX) 500 mg in sodium chloride 0.9 % 250 mL IVPB  500 mg Intravenous Q24H Lacretia Leigh, MD       cefTRIAXone (ROCEPHIN) 2 g in sodium chloride 0.9 % 100 mL IVPB  2 g Intravenous Q24H Lacretia Leigh, MD       sodium chloride (PF) 0.9 % injection             PHYSICAL EXAMINATION: ECOG PERFORMANCE STATUS: 2-3   Vitals:   09/07/18 1409 09/07/18 1421  BP:  108/76  Pulse: 100   Resp: 20   Temp:    SpO2: 96%    Filed Weights   09/07/18 1344  Weight: 172 lb 11.2 oz (78.3 kg)    GENERAL: ill appearing but alert, in mild respiratory distress  SKIN: diaphoretic  EYES: sclera clear LYMPH:  no palpable lymphadenopathy in the cervical, axillary or inguinal LUNGS: tachypneic with labored respirations. wheezing on  right. Diminished throughout on left   HEART: tachycardic  Musculoskeletal:no cyanosis of digits  NEURO: alert & oriented x 3 with fluent speech   LABORATORY DATA:  I have reviewed the data as listed CBC Latest Ref Rng & Units 09/07/2018 08/17/2018 07/26/2018  WBC 4.0 - 10.5 K/uL 28.9(H) 13.4(H) 9.4  Hemoglobin 12.0 - 15.0 g/dL 11.7(L) 11.9(L) 10.8(L)  Hematocrit 36.0 - 46.0 % 36.9 38.6 34.9(L)  Platelets 150 - 400 K/uL 393 472(H) 466(H)     CMP Latest Ref Rng & Units 09/07/2018 08/17/2018 07/26/2018  Glucose 70 - 99 mg/dL 103(H) 110(H) 102(H)  BUN 6 - 20 mg/dL 6 7 7   Creatinine 0.44 - 1.00 mg/dL 0.65 0.74 0.70  Sodium 135 - 145 mmol/L 136 135 136  Potassium 3.5 - 5.1 mmol/L 3.0(LL) 4.2 3.8  Chloride 98 - 111 mmol/L 101 99 100  CO2 22 - 32 mmol/L 23 28 27   Calcium 8.9 - 10.3 mg/dL 8.6(L) 8.7(L) 8.4(L)  Total Protein 6.5 - 8.1 g/dL 6.6 6.4(L) 6.1(L)  Total Bilirubin 0.3 - 1.2 mg/dL 0.3 0.2(L) 0.3  Alkaline Phos 38 - 126 U/L 282(H) 343(H) 394(H)  AST 15 - 41 U/L 30 19 32  ALT 0 - 44 U/L 38 17 66(H)      RADIOGRAPHIC STUDIES: I have personally reviewed the radiological images as listed and agreed with the findings in the report. Dg Chest 2 View  Result Date: 09/07/2018 CLINICAL DATA:  Shortness of breath EXAM: CHEST - 2 VIEW COMPARISON:  08/29/2018, CT 07/21/2018 FINDINGS: Right-sided central venous port tip over the cavoatrial region. Right parahilar architectural distortion with cavitary lesion. Slight increased ground-glass opacity at the periphery. New ground-glass opacity within the lingula and left lung base. Stable heart size. No pneumothorax. IMPRESSION: 1. New ground-glass opacity within the lingula, left base and right upper lobe, suspect for multifocal infection, to include possible atypical or viral pneumonia. 2. Right hilar/perihilar architectural distortion with cavitary lesion as noted on previous CT examinations. Electronically Signed   By: Donavan Foil M.D.   On:  09/07/2018 16:02   Ct Angio Chest Pe W/cm &/or Wo Cm  Result Date: 09/07/2018 CLINICAL DATA:  Lung cancer.  Shortness of breath. EXAM: CT ANGIOGRAPHY CHEST WITH CONTRAST TECHNIQUE: Multidetector CT imaging of the chest was performed using the standard protocol during bolus administration of intravenous contrast. Multiplanar CT image reconstructions and MIPs were obtained to evaluate the vascular anatomy. CONTRAST:  165mL OMNIPAQUE IOHEXOL 350 MG/ML SOLN COMPARISON:  CT dated 04/28/2018. CT from Jul 21, 2018 could not be retrieved for comparison. FINDINGS: Cardiovascular: There is a well-positioned right-sided Port-A-Cath. Evaluation for pulmonary emboli is severely limited by motion artifact and suboptimal contrast bolus timing. The main pulmonary artery as well as the left and right pulmonary arteries are dilated. The heart size is normal. There is no significant pericardial effusion. Coronary artery calcifications are noted. Mediastinum/Nodes: Again noted are pathologically enlarged mediastinal lymph nodes. There is a right superior paratracheal lymph node measuring approximately 1.6 cm (previously measuring 1.3 cm. There is an interval increase in size of multiple left AP window lymph nodes when compared to CT from February 2020. There are pathologically enlarged right hilar lymph nodes. Amorphous soft tissues again noted in the right perihilar region. There are no pathologically enlarged axillary lymph nodes. No pathologically enlarged supraclavicular lymph nodes. Lungs/Pleura: There are ground-glass airspace opacities involving the left upper, left lower, and right upper lobes. There is a 4.2 cm cavitary air-filled structure in the right lower lobe. There is improved aeration within the right middle lobe when compared to CT from April 23, 2018. There is a small right-sided pleural effusion. Upper Abdomen: Again noted is a right adrenal mass measuring approximately 3.1 x 3 cm. The left adrenal gland is only  partially visualized and is unremarkable. There is mild intrahepatic biliary ductal dilatation which appears stable from prior studies. There are surgical clips near the GE junction. 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 Musculoskeletal: There is a growing soft tissue nodule in the anterior midline chest measuring approximately 1.5 cm (axial series 4, image 73). There is no acute displaced fracture. Review of the MIP images confirms the above findings. IMPRESSION: 1.  Detection of pulmonary emboli is severely limited by motion artifact and suboptimal contrast bolus timing. Given this limitation, no PE was identified. The pulmonary arteries are dilated which can be seen in patients with elevated pulmonary artery pressures. 2. Diffuse ground-glass airspace opacities involving the right upper lobe, left upper lobe, and left lower lobe. Findings are concerning for multifocal pneumonia (viral or bacterial). 3. Persistent ill-defined soft tissue in the right perihilar region with multiple pathologically enlarged mediastinal and hilar lymph nodes. There is a growing air-filled cavitary area within the right perihilar region which may be related to post treatment changes. 4. Small right-sided pleural effusion. 5. Growing subcutaneous soft tissue nodule in the anterior midline left chest wall as detailed above. This could represent a metastatic deposit. Again noted is a right adrenal metastatic lesion as detailed above. Aortic Atherosclerosis (ICD10-I70.0). Electronically Signed   By: Constance Holster M.D.   On: 09/07/2018 17:08     ASSESSMENT & PLAN: Manasvi Dickard a 53 y.o.femalewith   1. Hypoxia, respiratory distress 2. 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 -Diagnosed in 09/2017. Treated with chemo and radiation. She was initially onfirst linecarboplatin, paclitaxel, Atezolizumab and bevacizumab every 3weekss/p 6 cycles, with good partial  response.Switched tomaintenance therapy withAvastinandAtezolizumabq3weeksstartedon 03/24/18. -Due to disease progression, chemoswitched tosecond line alimta 500mg /m2 every 3 weekson 05/02/2018, shehas been tolerating well -CT CAP 07/21/18 showing disease progression, Dr. Burr Medico recommended changing to third line treatment docetaxel and Cyramza every 3 weeks; the goal is palliative  -S/p cycle 2 on 08/17/18 3.Dyspnea and dry cough -secondary to her COPD, right lung cancer and radiation changes continuebronchodilator 4. Frontal Headaches - stable, followed by Dr. Mickeal Skinner  5. Anxiety and Depression -Currently on Xanax and Prozac; mood improved after cymbalta was switched to prozac. 6. Chronic back pain -Currently on Tramadol 50 mg q6Hr.Stable 7. Low appetite, Nausea-Currently on Mirtazapine, omeprazole, ondansetron,prochlorperazine, and sucralfate. 8. Hypokalemia - on K replacement 45meq daily 9.Transaminitis - likely related to chemo 10.Goal of care discussion - full code   Yvonne Lang presents today for routine chemotherapy consisting of docetaxel and ramucirumab, s/p cycle 2 on 08/17/18. She was being treated with bactrim and prednisone for COPD exacerbation in the interval. WBC 28.9 today, afebrile. She appears in mild respiratory distress today with tachycardia, tachypnea and hypoxia O2 sat 87% on RA. Sats came up to 90's on 2 liters O2. Given her metastatic lung cancer, h/o COPD and other risk factors, symptoms are concerning for worsening infection, disease progression, PE. Will send to ER for urgent evaluation. She will also be tested for COVID19. No treatment today. The patient was seen by Dr. Burr Medico.  All questions were answered. The patient knows to call the clinic with any problems, questions or concerns. No barriers to learning was detected.     Alla Feeling, NP 09/07/18    Addendum  I have seen the patient, examined her. I agree with the assessment and and plan  and have edited the notes.   Yvonne Lang has increased dyspnea, even at rest, and hypoxia, requires oxygen which she is not on at home.  Concerning for pulmonary infection, PE or her lung cancer progression.  She was treated for COPD exacerbation last week in our clinic, giving the acute deterioration, was sent her to [facility] emergency room for further evaluation, including CT scan, and likely admission.  I will follow her in the hospital, I called ED physician and discussed her case.  Truitt Merle  09/07/2018

## 2018-09-07 NOTE — Progress Notes (Signed)
Pharmacy Antibiotic Note  Yvonne Lang is a 53 y.o. female admitted on 09/07/2018 with pneumonia.  Pharmacy has been consulted for vancomycin and zosyn dosing.  She rec'd Rocephin and azithromycin in the ED tonight.   Plan: Zosyn 3.375g IV q8h (4 hour infusion).  Vancomycin 1500 mg IV loading dose Vancomycin 1000 mg IV Q 12 hrs. Goal AUC 400-550. Expected AUC: 506.5 SCr used: 0.8, Wt 78.3 Daily SCr while on both vanc and zosyn F/u renal fxn, WBC, temp, culture data Vancomycin levels as needed    Temp (24hrs), Avg:98.4 F (36.9 C), Min:97.9 F (36.6 C), Max:98.9 F (37.2 C)  Recent Labs  Lab 09/07/18 1250 09/07/18 1813 09/07/18 2106  WBC 28.9*  --  21.1*  CREATININE 0.65  --  0.52  LATICACIDVEN  --  2.3*  --     Estimated Creatinine Clearance: 87.7 mL/min (by C-G formula based on SCr of 0.52 mg/dL).    Allergies  Allergen Reactions  . Codeine Other (See Comments)    Migraine headache    Antimicrobials this admission: 6/18 azith x 1 6/18 CTX x 1 6/19 Vanc> 6/19 zosyn >> Dose adjustments this admission:  Microbiology results: 6/18 COVID 19 neg 6/18 BCx2: sent  Thank you for allowing pharmacy to be a part of this patient's care.  Eudelia Bunch, Pharm.D 09/07/2018 11:15 PM

## 2018-09-07 NOTE — Progress Notes (Signed)
Pt here for f/u and chemo but pt is having increased SOB, tachycardia, audible wheezing, headache. S02 91%, RR 24 P 117  Pt states she took her last prednisone for COPD exacerbation this morning and started feeling worse later this am. CXR done last week. Has not had COVID test done  H/o lung cancer , COPD  Port is accessed but flush nurse unable to get blood return. Cathflo had been instilled @ 1312. Blood return checked @ 1345, no blood return.  Cira Rue, NP made aware. Started on 2l/min [place].  S02 96-100, pulse down from 117 to 100. BP 108/76  Pt states she is feeling a bit better with 02 Cira Rue.NP and Dr. Burr Medico spoke to charge nurse and MD in ED  Pt to be transferred to ED room # 20 via w/c and 02  Report given to [facility], RN in the ED. Pt tolerated transfer well. Checked port for blood return once arrived in ED-still no blood return. Delle Reining, RN in ED aware.

## 2018-09-08 ENCOUNTER — Encounter (HOSPITAL_COMMUNITY): Payer: Self-pay

## 2018-09-08 ENCOUNTER — Telehealth: Payer: Self-pay | Admitting: Nurse Practitioner

## 2018-09-08 DIAGNOSIS — Z66 Do not resuscitate: Secondary | ICD-10-CM

## 2018-09-08 DIAGNOSIS — D72829 Elevated white blood cell count, unspecified: Secondary | ICD-10-CM

## 2018-09-08 DIAGNOSIS — C3431 Malignant neoplasm of lower lobe, right bronchus or lung: Secondary | ICD-10-CM

## 2018-09-08 DIAGNOSIS — C7989 Secondary malignant neoplasm of other specified sites: Secondary | ICD-10-CM

## 2018-09-08 DIAGNOSIS — C7970 Secondary malignant neoplasm of unspecified adrenal gland: Secondary | ICD-10-CM

## 2018-09-08 LAB — DIFFERENTIAL
Basophils Absolute: 0 10*3/uL (ref 0.0–0.1)
Basophils Relative: 0 %
Eosinophils Absolute: 0 10*3/uL (ref 0.0–0.5)
Eosinophils Relative: 0 %
Lymphocytes Relative: 9 %
Lymphs Abs: 1.2 10*3/uL (ref 0.7–4.0)
Monocytes Absolute: 1.8 10*3/uL — ABNORMAL HIGH (ref 0.1–1.0)
Monocytes Relative: 13 %
Neutro Abs: 10.6 10*3/uL — ABNORMAL HIGH (ref 1.7–7.7)
Neutrophils Relative %: 77 %

## 2018-09-08 LAB — CBC
HCT: 32.9 % — ABNORMAL LOW (ref 36.0–46.0)
Hemoglobin: 10.4 g/dL — ABNORMAL LOW (ref 12.0–15.0)
MCH: 30.3 pg (ref 26.0–34.0)
MCHC: 31.6 g/dL (ref 30.0–36.0)
MCV: 95.9 fL (ref 80.0–100.0)
Platelets: 356 10*3/uL (ref 150–400)
RBC: 3.43 MIL/uL — ABNORMAL LOW (ref 3.87–5.11)
RDW: 14.4 % (ref 11.5–15.5)
WBC: 13.8 10*3/uL — ABNORMAL HIGH (ref 4.0–10.5)
nRBC: 0 % (ref 0.0–0.2)

## 2018-09-08 LAB — RESPIRATORY PANEL BY PCR

## 2018-09-08 LAB — SARS CORONAVIRUS 2 BY RT PCR (HOSPITAL ORDER, PERFORMED IN ~~LOC~~ HOSPITAL LAB): SARS Coronavirus 2: NEGATIVE

## 2018-09-08 LAB — BASIC METABOLIC PANEL
Anion gap: 11 (ref 5–15)
BUN: 5 mg/dL — ABNORMAL LOW (ref 6–20)
CO2: 23 mmol/L (ref 22–32)
Calcium: 8 mg/dL — ABNORMAL LOW (ref 8.9–10.3)
Chloride: 103 mmol/L (ref 98–111)
Creatinine, Ser: 0.45 mg/dL (ref 0.44–1.00)
GFR calc Af Amer: >60 mL/min (ref >60)
GFR calc non Af Amer: >60 mL/min (ref >60)
Glucose, Bld: 101 mg/dL — ABNORMAL HIGH (ref 70–99)
Potassium: 3.4 mmol/L — ABNORMAL LOW (ref 3.5–5.1)
Sodium: 137 mmol/L (ref 135–145)

## 2018-09-08 LAB — INFLUENZA PANEL BY PCR (TYPE A & B)
Influenza A By PCR: NEGATIVE
Influenza B By PCR: NEGATIVE

## 2018-09-08 LAB — STREP PNEUMONIAE URINARY ANTIGEN: Strep Pneumo Urinary Antigen: NEGATIVE

## 2018-09-08 LAB — EXPECTORATED SPUTUM ASSESSMENT W GRAM STAIN, RFLX TO RESP C

## 2018-09-08 LAB — MAGNESIUM: Magnesium: 2 mg/dL (ref 1.7–2.4)

## 2018-09-08 LAB — MRSA PCR SCREENING
MRSA by PCR: NEGATIVE
MRSA by PCR: NEGATIVE

## 2018-09-08 MED ORDER — ALPRAZOLAM 0.25 MG PO TABS
0.2500 mg | ORAL_TABLET | Freq: Once | ORAL | Status: AC | PRN
  Administered 2018-09-08: 0.25 mg via ORAL
  Filled 2018-09-08: qty 1

## 2018-09-08 MED ORDER — ALPRAZOLAM 0.25 MG PO TABS
0.2500 mg | ORAL_TABLET | Freq: Three times a day (TID) | ORAL | Status: DC | PRN
  Administered 2018-09-08 – 2018-09-14 (×10): 0.25 mg via ORAL
  Filled 2018-09-08 (×10): qty 1

## 2018-09-08 MED ORDER — ORAL CARE MOUTH RINSE
15.0000 mL | Freq: Two times a day (BID) | OROMUCOSAL | Status: DC
  Administered 2018-09-08 – 2018-09-14 (×10): 15 mL via OROMUCOSAL

## 2018-09-08 MED ORDER — ALBUTEROL SULFATE (2.5 MG/3ML) 0.083% IN NEBU: 3.0000 mL | INHALATION_SOLUTION | RESPIRATORY_TRACT | Status: DC | PRN

## 2018-09-08 MED ORDER — SODIUM CHLORIDE 0.9% FLUSH: 10.0000 mL | INTRAVENOUS | Status: DC | PRN

## 2018-09-08 MED ORDER — CHLORHEXIDINE GLUCONATE 0.12 % MT SOLN
15.0000 mL | Freq: Two times a day (BID) | OROMUCOSAL | Status: DC
  Administered 2018-09-08 – 2018-09-14 (×13): 15 mL via OROMUCOSAL
  Filled 2018-09-08 (×13): qty 15

## 2018-09-08 MED ORDER — IPRATROPIUM BROMIDE 0.02 % IN SOLN: 0.5000 mg | RESPIRATORY_TRACT | Status: DC

## 2018-09-08 MED ORDER — IPRATROPIUM-ALBUTEROL 0.5-2.5 (3) MG/3ML IN SOLN
3.0000 mL | Freq: Three times a day (TID) | RESPIRATORY_TRACT | Status: DC
  Administered 2018-09-08: 3 mL via RESPIRATORY_TRACT
  Filled 2018-09-08: qty 3

## 2018-09-08 MED ORDER — METHYLPREDNISOLONE SODIUM SUCC 40 MG IJ SOLR
40.0000 mg | Freq: Two times a day (BID) | INTRAMUSCULAR | Status: DC
  Administered 2018-09-08 – 2018-09-12 (×9): 40 mg via INTRAVENOUS
  Filled 2018-09-08 (×9): qty 1

## 2018-09-08 MED ORDER — POTASSIUM CHLORIDE CRYS ER 20 MEQ PO TBCR
40.0000 meq | EXTENDED_RELEASE_TABLET | Freq: Once | ORAL | Status: AC
  Administered 2018-09-08: 40 meq via ORAL
  Filled 2018-09-08: qty 2

## 2018-09-08 MED ORDER — DEXTROSE 50 % IV SOLN
INTRAVENOUS | Status: AC
  Filled 2018-09-08: qty 50

## 2018-09-08 MED ORDER — CHLORHEXIDINE GLUCONATE CLOTH 2 % EX PADS
6.0000 | MEDICATED_PAD | Freq: Every day | CUTANEOUS | Status: DC
  Administered 2018-09-09: 05:00:00 6 via TOPICAL

## 2018-09-08 MED ORDER — IPRATROPIUM BROMIDE HFA 17 MCG/ACT IN AERS: 2.0000 | INHALATION_SPRAY | RESPIRATORY_TRACT | Status: DC

## 2018-09-08 NOTE — Progress Notes (Addendum)
Dr. Florene Glen called to inquire about patients increased O2 requirements. MD updated that this writer was the oncoming nurses and the night shift nurse reported that patient went from 2L [place] to 10L high flow during the night because of SOB and increased work of breathing. MD made aware that patient is currently having some labored respirations and may require a higher level of care. MD states that he is coming up to unit to assess patient but will be writing new orders which will require that the patient be placed on airborne precautions and transferred to step down. Orders entered ICU charge nurse made aware. Patient to be transferred to stepdown

## 2018-09-08 NOTE — Progress Notes (Signed)
Pharmacy Antibiotic Note  Yvonne Lang is a 53 y.o. female admitted on 09/07/2018 with pneumonia.  Pharmacy has been consulted for vancomycin and zosyn dosing.  She rec'd Rocephin and azithromycin in the ED tonight.   Plan: Zosyn 3.375g IV q8h (4 hour infusion).  Vancomycin d/c'd.   No dose adjustments anticipated for Zosyn.  Pharmacy to sign off.  Please re-consult if needed.   Height: 5\' 7"  (170.2 cm) Weight: 178 lb 12.7 oz (81.1 kg) IBW/kg (Calculated) : 61.6  Temp (24hrs), Avg:98.5 F (36.9 C), Min:97.9 F (36.6 C), Max:98.9 F (37.2 C)  Recent Labs  Lab 09/07/18 1250 09/07/18 1813 09/07/18 2106 09/08/18 0607  WBC 28.9*  --  21.1* 13.8*  CREATININE 0.65  --  0.52 0.45  LATICACIDVEN  --  2.3*  --   --     Estimated Creatinine Clearance: 89.1 mL/min (by C-G formula based on SCr of 0.45 mg/dL).    Allergies  Allergen Reactions  . Codeine Other (See Comments)    Migraine headache    Antimicrobials this admission: 6/18 azith x 1 6/18 CTX x 1 6/19 Vanc>6/19 6/19 zosyn >> Dose adjustments this admission:  Microbiology results: 6/18 COVID 19 neg 6/18 BCx2: sent  Thank you for allowing pharmacy to be a part of this patient's care.  Eudelia Bunch, Pharm.D 09/08/2018 9:08 AM

## 2018-09-08 NOTE — Evaluation (Signed)
Physical Therapy Evaluation Patient Details Name: Yvonne Lang MRN: [id number] DOB: Jul 02, 1965 Today's Date: 09/08/2018   History of Present Illness  53 yo female admitted to ED on 6/18 with CAP, with acute hypoxic respiratory failure. Pt with lung cancer undergoing chemo currently. Covid test negative x2, MD still ruling it out as pt with diffuse ground glass appearance of lungs on CXR. PMH includes COPD, anxiety, bronchitis, depression, gastric bypass, IBS, PE.  Clinical Impression  Pt presents with increased time and effort to perform mobility tasks, tachypnea and increased work of breathing with exertion, and decreased activity tolerance. Pt to benefit from acute PT to address deficits. Pt ambulated short room distance with min guard assist +2 for safety/equipment management. Pt's main limiting factor is respiratory status, as pt's strength is [facility] and pt is very mobile. Pt with tachypnea up to 44 breaths/min and tachycardia up to 114 bpm. Sats >92% on 10LO2 via HFNC, one period of sats to 86% when pt removed O2 to use a tissue. PT recommending no PT follow up at this time, mobility appears to be close to baseline. PT to maintain pt's mobility status, progress mobility as tolerated, and will continue to follow acutely.      Follow Up Recommendations No PT follow up;Supervision for mobility/OOB    Equipment Recommendations  None recommended by PT    Recommendations for Other Services       Precautions / Restrictions Precautions Precautions: Fall Precaution Comments: Pt on airborne precautions following covid negative test x2, per MD request Restrictions Weight Bearing Restrictions: No      Mobility  Bed Mobility Overal bed mobility: Needs Assistance Bed Mobility: Supine to Sit     Supine to sit: Supervision;HOB elevated     General bed mobility comments: supervision for safety, increased time to come to sitting EOB.  Transfers Overall transfer level: Needs  assistance Equipment used: None Transfers: Sit to/from Stand Sit to Stand: Min guard         General transfer comment: Min guard for safety, pt with no unsteadiness with rising.  Ambulation/Gait Ambulation/Gait assistance: Min guard Gait Distance (Feet): 10 Feet Assistive device: None;IV Pole Gait Pattern/deviations: Step-through pattern;Decreased stride length Gait velocity: decr   General Gait Details: Min guard for safety, initially holding IV pole for steadying and transitioned to no UE support. Pt with tachypnea up to 44 breaths/min and tachycardia up to 114 bpm. Sats >92% on 10LO2 via HFNC.  Stairs            Wheelchair Mobility    Modified Rankin (Stroke Patients Only)       Balance Overall balance assessment: Mild deficits observed, not formally tested                                           Pertinent Vitals/Pain Pain Assessment: No/denies pain    Home Living Family/patient expects to be discharged to:: Private residence Living Arrangements: Parent(mother) Available Help at Discharge: Family;Available 24 hours/day Type of Home: House Home Access: Stairs to enter Entrance Stairs-Rails: Right Entrance Stairs-Number of Steps: 4 or 5 Home Layout: One level Home Equipment: None      Prior Function Level of Independence: Independent               Hand Dominance   Dominant Hand: Right    Extremity/Trunk Assessment   Upper Extremity Assessment Upper Extremity Assessment: Overall  WFL for tasks assessed    Lower Extremity Assessment Lower Extremity Assessment: Overall WFL for tasks assessed    Cervical / Trunk Assessment Cervical / Trunk Assessment: Normal  Communication   Communication: No difficulties  Cognition Arousal/Alertness: Awake/alert Behavior During Therapy: WFL for tasks assessed/performed Overall Cognitive Status: Within Functional Limits for tasks assessed                                         General Comments      Exercises     Assessment/Plan    PT Assessment Patient needs continued PT services  PT Problem List Decreased safety awareness;Decreased mobility;Decreased activity tolerance;Decreased balance;Cardiopulmonary status limiting activity       PT Treatment Interventions DME instruction;Therapeutic activities;Gait training;Therapeutic exercise;Patient/family education;Balance training;Stair training;Functional mobility training    PT Goals (Current goals can be found in the Care Plan section)  Acute Rehab PT Goals Patient Stated Goal: go home, breathe better PT Goal Formulation: With patient Time For Goal Achievement: 09/22/18 Potential to Achieve Goals: Good    Frequency Min 3X/week   Barriers to discharge        Co-evaluation               AM-PAC PT "6 Clicks" Mobility  Outcome Measure Help needed turning from your back to your side while in a flat bed without using bedrails?: None Help needed moving from lying on your back to sitting on the side of a flat bed without using bedrails?: None Help needed moving to and from a bed to a chair (including a wheelchair)?: A Little Help needed standing up from a chair using your arms (e.g., wheelchair or bedside chair)?: A Little Help needed to walk in hospital room?: A Little Help needed climbing 3-5 steps with a railing? : A Little 6 Click Score: 20    End of Session Equipment Utilized During Treatment: Oxygen(10L HFNC) Activity Tolerance: Treatment limited secondary to medical complications (Comment)(tachypnea, increased work of breathing) Patient left: in chair;with call bell/phone within reach Nurse Communication: Mobility status PT Visit Diagnosis: Other abnormalities of gait and mobility (R26.89)    Time: 1430-1445 PT Time Calculation (min) (ACUTE ONLY): 15 min   Charges:   PT Evaluation $PT Eval Low Complexity: 1 Low         Julien Girt, PT Acute Rehabilitation  Services Pager [phone]  Office [phone]  Kaysey Berndt D Elonda Husky 09/08/2018, 4:46 PM

## 2018-09-08 NOTE — Progress Notes (Addendum)
HEMATOLOGY-ONCOLOGY PROGRESS NOTE  SUBJECTIVE: The patient developed worsening shortness of breath overnight and O2 requirements increased from 2 L up to 10 L.  She was transferred to the stepdown unit earlier today.  Repeat COVID testing has been obtained and is pending.  Initial COVID testing was negative.  Denies any sick contacts.  She reports that she has been going to church but has been wearing a mask and trying to social distance.  She remains afebrile and denies chills.  She denies cough and chest discomfort.  She reports one episode of diarrhea this morning but thinks is unchanged from her baseline given her history of gastric bypass surgery.  Has abdominal pain, nausea, vomiting.  She has no other complaints this morning.  Oncology History Overview Note  Cancer Staging Metastatic non-small cell lung cancer [facility]) Staging form: Lung, AJCC 8th Edition - Clinical stage from 10/17/2017: Stage IV (cT3, cN1, cM1b) - Signed by Truitt Merle, MD on 10/17/2017     Metastatic non-small cell lung cancer ([place])  09/20/2017 Imaging   US Breast Left 09/20/17  IMPRESSION Indeterminte palpable mass measuring 3.2x1.7x2.8 cm  inferior medial to the inframammary fold of the left breast.     09/20/2017 Initial Biopsy   Diagnosis 09/20/17 Soft Tissue Needle Core Biopsy, inferior medial to IMF - ADENOCARCINOMA. - SEE MICROSCOPIC DESCRIPTION.  Microscopic Comment Immunohistochemistry will be performed and reported as an addendum. (JDP:ah 09/23/17) ADDENDUM: Immunohistochemistry shows the tumor is strongly positive with cytokeratin AE1/AE3, cytokeratin 7 and shows moderate weak positivity with CDX-2. The tumor is negative with estrogen receptor, progesterone receptor, GCDFP, GATA-3, Napsin A, TTF-1, WT-1 and cytokeratin 20. The immunophenotype is consistent with metastatic carcinoma. The combination of CDX-2 and cytokeratin 7 positivity raises the possibility of an upper gastrointestinal primary. Clinical  correlation is essential.   10/06/2017 Initial Diagnosis   Metastatic adenocarcinoma involving soft tissue with unknown primary site [facility])   10/10/2017 Procedure   Upper Endoscopy by Dr. Silverio Decamp 10/10/17  IMPRESSION - Normal esophagus. - Z-line regular, 35 cm from the incisors. - Gastric bypass with a normal-sized pouch and intact staple line. Gastrojejunal anastomosis characterized by healthy appearing mucosa. - No specimens collected.   10/12/2017 Imaging   CT CAP WO Contrast 10/12/17  IMPRESSION: 7 cm central right lung mass involving the right hilum, with postobstructive collapse of the right middle lobe, highly suspicious for primary bronchogenic carcinoma. 5 cm masslike opacity in the superior segment of the right lower lobe may represent carcinoma or postobstructive pneumonitis. 3.3 cm soft tissue mass in the lower anterior chest wall soft tissues, suspicious for metastatic disease. No evidence of abdominal or pelvic metastatic disease.    10/14/2017 Imaging   MRI Brain 10/14/17  IMPRESSION: 1. No metastatic disease or acute intracranial abnormality. Normal MRI appearance of the brain. 2. 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     10/17/2017 Cancer Staging   Staging form: Lung, AJCC 8th Edition - Clinical stage from 10/17/2017: Stage IV (cT3, cN1, cM1b) - Signed by Truitt Merle, MD on 10/17/2017   10/24/2017 - 11/04/2017 Radiation Therapy    The Right lung mass was treated to 30 Gy in 10 fractions of 3 Gy   11/08/2017 -  Chemotherapy   -first line chemo with carboplatin, paclitaxel, Atezolizumab and bevacizumab every 3weeks starting 11/08/17.  -Switched to maintenance therapy with avastin and atezolizumab q3weeks on 03/24/18   01/10/2018 Imaging   01/10/2018 CT CA IMPRESSION: 1. Response to therapy of central right lower lobe lung mass.  Re-expansion of the right middle lobe with significantly improved right lower lobe postobstructive pneumonitis. Residual  geographic airspace and ground-glass opacity within the medial right lung, primarily favored to be radiation induced. 2. Near complete resolution of presternal soft tissue mass. 3. New right adrenal nodule since 10/11/2017, suspicious for metastatic disease. 4. Right-sided Port-A-Cath with probable nonocclusive thrombus along the catheter within the right brachiocephalic vein. 5. Age advanced coronary artery atherosclerosis. Recommend assessment of coronary risk factors and consideration of medical therapy. 6. Aortic atherosclerosis (ICD10-I70.0) and emphysema (ICD10-J43.9).   03/10/2018 Imaging   CT CAP W Contrast 03/10/18 IMPRESSION: 1. Radiation changes involving the right paramediastinal lung and right hilum but no findings suspicious for residual or recurrent tumor. 2. No evidence of pulmonary metastatic disease. Stable emphysematous changes and areas of pulmonary scarring. 3. Stable 11 mm right adrenal gland nodule. 4. No findings for metastatic disease involving the liver or bony structures.   03/24/2018 - 04/24/2018 Chemotherapy   The patient had bevacizumab (AVASTIN) 1,300 mg in sodium chloride 0.9 % 100 mL chemo infusion, 15.5 mg/kg = 1,275 mg, Intravenous,  Once, 1 of 4 cycles Administration: 1,300 mg (03/24/2018) atezolizumab (TECENTRIQ) 1,200 mg in sodium chloride 0.9 % 250 mL chemo infusion, 1,200 mg, Intravenous, Once, 1 of 4 cycles Administration: 1,200 mg (03/24/2018)  for chemotherapy treatment.    04/11/2018 Imaging   CT ANGIO CHEST PE W OR WO CONTRAST  IMPRESSION: 1. No evidence of acute pulmonary embolism. 2. Progressive radiation changes in the right perihilar region. Cavitary retro hilar mass and right paratracheal lymph node have enlarged, suspicious for local recurrence/disease progression. 3. Enlarging right adrenal nodule suspicious for metastatic disease. 4. New right pleural effusion with increased atelectasis at both lung bases. 5. These results will be  called to the ordering clinician or representative by the Radiology Department at the imaging location.    04/21/2018 Imaging   NUCLEAR MEDICINE PET SKULL BASE TO THIGH IMPRESSION: 1. Marked hypermetabolism in the amorphous soft tissue involving the right hilum. This represents the central component of the apparent radiation scarring. 2. Hypermetabolic mediastinal nodal metastases with nodal metastases identified in the anterior right juxta diaphragmatic fat. 3. Hypermetabolic right adrenal metastasis. 4. Interval progression of loculated right pleural effusion.    05/02/2018 -  Chemotherapy   Second line chemo Alimta 500mg /m2 very 3 weeks    07/21/2018 Imaging   CT CAP 07/21/18  IMPRESSION: 1. Enlarging right adrenal metastatic lesion a mildly enlarging mediastinal adenopathy compatible with mild progression of disease. No new metastatic foci are identified. 2. Evolutionary findings along the right perihilar/paramediastinal radiation fibrosis including some improvement in aeration in the right upper lobe, but enlargement of the cavitary/centrally necrotic region in the superior segment right lower lobe, which is gas-filled and which probably connects to the otherwise focally occluded bronchus intermedius. 3. Other imaging findings of potential clinical significance: Probable left anterior descending coronary atherosclerosis. Mild to moderate intrahepatic biliary dilatation mild extrahepatic biliary dilatation, much of which may be a physiologic response to cholecystectomy. Prominent stool throughout the colon favors constipation. Aortic Atherosclerosis (ICD10-I70.0).   07/26/2018 -  Chemotherapy   The patient had pegfilgrastim-cbqv (UDENYCA) injection 6 mg, 6 mg, Subcutaneous, Once, 0 of 4 cycles DOCEtaxel (TAXOTERE) 140 mg in sodium chloride 0.9 % 250 mL chemo infusion, 75 mg/m2 = 140 mg, Intravenous,  Once, 2 of 6 cycles Administration: 140 mg (07/26/2018), 140 mg  (08/17/2018) ramucirumab (CYRAMZA) 800 mg in sodium chloride 0.9 % 170 mL chemo infusion, 10 mg/kg =  800 mg, Intravenous, Once, 2 of 6 cycles Administration: 800 mg (07/26/2018), 800 mg (08/17/2018)  for chemotherapy treatment.       REVIEW OF SYSTEMS:   Constitutional: Denies fevers, chills Eyes: Denies blurriness of vision Ears, nose, mouth, throat, and face: Denies mucositis or sore throat Respiratory: Has worsening shortness of breath and O2 requirements have increased from 2 L to 10 L.  Denies cough. Cardiovascular: Denies palpitation, chest discomfort Gastrointestinal: Denies abdominal pain, nausea, vomiting.  Reported 1 episode of diarrhea earlier this morning. Skin: Denies abnormal skin rashes Lymphatics: Denies new lymphadenopathy or easy bruising Neurological:Denies numbness, tingling or new weaknesses Behavioral/Psych: Mood is stable, no new changes  Extremities: No lower extremity edema All other systems were reviewed with the patient and are negative.  I have reviewed the past medical history, past surgical history, social history and family history with the patient and they are unchanged from previous note.   PHYSICAL EXAMINATION: ECOG PERFORMANCE STATUS: 2 - Symptomatic, <50% confined to bed  Vitals:   09/08/18 0506 09/08/18 0730  BP: 119/88   Pulse: (!) 102   Resp: 19   Temp: 98.9 F (37.2 C)   SpO2: 94% 95%   Filed Weights   09/07/18 2039  Weight: 178 lb 12.7 oz (81.1 kg)    Intake/Output from previous day: 06/18 0701 - 06/19 0700 In: 1063 [I.V.:413; IV Piggyback:650] Out: -   GENERAL:alert, no distress and comfortable SKIN: skin color, texture, turgor are normal, no rashes or significant lesions EYES: normal, Conjunctiva are pink and non-injected, sclera clear OROPHARYNX:no exudate, no erythema and lips, buccal mucosa, and tongue normal  NECK: supple, thyroid normal size, non-tender, without nodularity LYMPH:  no palpable lymphadenopathy in the  cervical, axillary or inguinal LUNGS: Forced rhonchi noted HEART: regular rate & rhythm and no murmurs and no lower extremity edema ABDOMEN:abdomen soft, non-tender and normal bowel sounds Musculoskeletal:no cyanosis of digits and no clubbing  NEURO: alert & oriented x 3 with fluent speech, no focal motor/sensory deficits  LABORATORY DATA:  I have reviewed the data as listed CMP Latest Ref Rng & Units 09/08/2018 09/07/2018 09/07/2018  Glucose 70 - 99 mg/dL 101(H) 103(H) -  BUN 6 - 20 mg/dL 5(L) 6 -  Creatinine 0.44 - 1.00 mg/dL 0.45 0.52 0.65  Sodium 135 - 145 mmol/L 137 136 -  Potassium 3.5 - 5.1 mmol/L 3.4(L) 3.0(LL) -  Chloride 98 - 111 mmol/L 103 101 -  CO2 22 - 32 mmol/L 23 23 -  Calcium 8.9 - 10.3 mg/dL 8.0(L) 8.6(L) -  Total Protein 6.5 - 8.1 g/dL - 6.6 -  Total Bilirubin 0.3 - 1.2 mg/dL - 0.3 -  Alkaline Phos 38 - 126 U/L - 282(H) -  AST 15 - 41 U/L - 30 -  ALT 0 - 44 U/L - 38 -    Lab Results  Component Value Date   WBC 13.8 (H) 09/08/2018   HGB 10.4 (L) 09/08/2018   HCT 32.9 (L) 09/08/2018   MCV 95.9 09/08/2018   PLT 356 09/08/2018   NEUTROABS 25.3 (H) 09/07/2018    Dg Chest 2 View  Result Date: 09/07/2018 CLINICAL DATA:  Shortness of breath EXAM: CHEST - 2 VIEW COMPARISON:  08/29/2018, CT 07/21/2018 FINDINGS: Right-sided central venous port tip over the cavoatrial region. Right parahilar architectural distortion with cavitary lesion. Slight increased ground-glass opacity at the periphery. New ground-glass opacity within the lingula and left lung base. Stable heart size. No pneumothorax. IMPRESSION: 1. New ground-glass opacity within the  lingula, left base and right upper lobe, suspect for multifocal infection, to include possible atypical or viral pneumonia. 2. Right hilar/perihilar architectural distortion with cavitary lesion as noted on previous CT examinations. Electronically Signed   By: Donavan Foil M.D.   On: 09/07/2018 16:02   Dg Chest 2 View  Result Date:  08/30/2018 CLINICAL DATA:  Shortness of breath, nonproductive cough. Lung cancer. EXAM: CHEST - 2 VIEW COMPARISON:  CT chest 07/21/2018 and chest radiograph 04/30/2018. FINDINGS: Trachea is midline. Heart size normal. Right IJ power port terminates in the low SVC or SVC RA junction. Post treatment parenchymal retraction in the right perihilar region with a focal area of lucency, better evaluated on 07/21/2018. Associated volume loss in the right hemithorax. Biapical pleuroparenchymal scarring, left greater than right. Lungs are otherwise clear. No pleural fluid. IMPRESSION: 1. No acute findings. 2. Post treatment changes in the right hemithorax, better evaluated on 07/21/2018. Electronically Signed   By: Lorin Picket M.D.   On: 08/30/2018 08:36   Ct Angio Chest Pe W/cm &/or Wo Cm  Result Date: 09/07/2018 CLINICAL DATA:  Lung cancer.  Shortness of breath. EXAM: CT ANGIOGRAPHY CHEST WITH CONTRAST TECHNIQUE: Multidetector CT imaging of the chest was performed using the standard protocol during bolus administration of intravenous contrast. Multiplanar CT image reconstructions and MIPs were obtained to evaluate the vascular anatomy. CONTRAST:  141mL OMNIPAQUE IOHEXOL 350 MG/ML SOLN COMPARISON:  CT dated 04/28/2018. CT from Jul 21, 2018 could not be retrieved for comparison. FINDINGS: Cardiovascular: There is a well-positioned right-sided Port-A-Cath. Evaluation for pulmonary emboli is severely limited by motion artifact and suboptimal contrast bolus timing. The main pulmonary artery as well as the left and right pulmonary arteries are dilated. The heart size is normal. There is no significant pericardial effusion. Coronary artery calcifications are noted. Mediastinum/Nodes: Again noted are pathologically enlarged mediastinal lymph nodes. There is a right superior paratracheal lymph node measuring approximately 1.6 cm (previously measuring 1.3 cm. There is an interval increase in size of multiple left AP window  lymph nodes when compared to CT from February 2020. There are pathologically enlarged right hilar lymph nodes. Amorphous soft tissues again noted in the right perihilar region. There are no pathologically enlarged axillary lymph nodes. No pathologically enlarged supraclavicular lymph nodes. Lungs/Pleura: There are ground-glass airspace opacities involving the left upper, left lower, and right upper lobes. There is a 4.2 cm cavitary air-filled structure in the right lower lobe. There is improved aeration within the right middle lobe when compared to CT from April 23, 2018. There is a small right-sided pleural effusion. Upper Abdomen: Again noted is a right adrenal mass measuring approximately 3.1 x 3 cm. The left adrenal gland is only partially visualized and is unremarkable. There is mild intrahepatic biliary ductal dilatation which appears stable from prior studies. There are surgical clips near the GE junction. 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 Musculoskeletal: There is a growing soft tissue nodule in the anterior midline chest measuring approximately 1.5 cm (axial series 4, image 73). There is no acute displaced fracture. Review of the MIP images confirms the above findings. IMPRESSION: 1. Detection of pulmonary emboli is severely limited by motion artifact and suboptimal contrast bolus timing. Given this limitation, no PE was identified. The pulmonary arteries are dilated which can be seen in patients with elevated pulmonary artery pressures. 2. Diffuse ground-glass airspace opacities involving the right upper lobe, left upper lobe, and left lower lobe. Findings are concerning for multifocal pneumonia (  viral or bacterial). 3. Persistent ill-defined soft tissue in the right perihilar region with multiple pathologically enlarged mediastinal and hilar lymph nodes. There is a growing air-filled cavitary area within the right perihilar region which may be related to post treatment changes.  4. Small right-sided pleural effusion. 5. Growing subcutaneous soft tissue nodule in the anterior midline left chest wall as detailed above. This could represent a metastatic deposit. Again noted is a right adrenal metastatic lesion as detailed above. Aortic Atherosclerosis (ICD10-I70.0). Electronically Signed   By: Constance Holster M.D.   On: 09/07/2018 17:08    ASSESSMENT AND PLAN: 1.  Hypoxia, respiratory distress 2.  Community-acquired pneumonia 3.  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  COPD 5.  Mild hypokalemia 6.  Anxiety/depression 7.  Leukocytosis secondary to infection 8.  Anemia secondary to recent chemotherapy 9.  Goals of care: Full code  -The patient is requiring increased oxygen and has been transferred to the stepdown unit.  Repeat COVID testing has been obtained and is pending. -Antibiotics per hospitalist for pneumonia -The patient completed her second cycle of Taxotere and Cyramza on 08/17/2018.  Therapy held on 09/07/1118 secondary to hypoxia and respiratory distress.  Will resume treatment for her cancer as an outpatient once she has recovered from her acute illness. -Continue steroids -Replete potassium per hospitalist -Continue fluoxetine -Monitor daily CBC.  Has fused packed red blood cells if her hemoglobin is less than 7.0 or active bleeding.  Transfusion indicated today. -Goals of care discussion with the patient today.  She desires CPR and mechanical ventilation if needed.    LOS: 1 day   Mikey Bussing, DNP, AGPCNP-BC, AOCNP 09/08/18   Addendum   I have seen the patient, examined her. I agree with the assessment and and plan and have edited the notes.   Elener is feeling better when I saw her in the late afternoon. She was transferred to ICU last night due to worsening hypoxia.  She is on high flow oxygen nasal cannula, 9l/min, she is able to talk in full sentences, VS stable. I  reviewed her CT scan, which showed multifocal groundglass infiltrates, pneumonitis versus infection. Her respiratory panel, including influenza and COVID all came back negative. I have recently switched her chemo to Docetaxel and Cyramza, she has had two doses, rarely chemo can cause pneumonitis, although it's very rare with this regimen. I agree with broad antibiotics, and steroids (she is on solumedro 80mg  daily) which can help pneumonitis.   We discussed code status, she understands her lung cancer is terminal, she has had 3 lines of chemotherapy, overall prognosis is very.  She is hoping her pneumonia is treatable, and she wants to be intubated if needed for her pneumonia treatment, but agrees not to be on life support for long time.   She has anxiety and has been on xanax at home. I will add low dose 0.25mg  as needed, I understand we should avoid sedative meds due to her hypoxia, but I do not think low dose xanax will suppress her breathing.   I will f/u on Monday, please call my on-call partner if needed over the weekend.  Truitt Merle  09/08/2018

## 2018-09-08 NOTE — Progress Notes (Signed)
PROGRESS NOTE    Yvonne Lang  YQI:[id number] DOB: [birth year] DOA: 09/07/2018 PCP: Redmond School, MD   Brief Narrative: Yvonne Lang is Yvonne Lang 53 y.o. female with Yvonne Lang known history of COPD, depression/anxiety, GERD, irritable bowel syndrome, lung cancer presents to the emergency department for evaluation of shortness of breath.  Patient was in Yvonne Lang usual state of health until this afternoon when she was at her oncologist office for follow-up appointment and chemotherapy when she was noted to have 1 week history of increased shortness of breath, tachycardia, tachypnea and hypoxia.  Her O2 sat was around 91%.  She was recently treated with prednisone for Dee Paden COPD exacerbation.  Her symptoms are refractory to albuterol treatments at home.  She did not end up receiving her chemotherapy today  Patient denies fevers/chills, weakness, dizziness, chest pain, N/V/C/D, abdominal pain, dysuria/frequency, changes in mental status.    Otherwise there has been no change in status. Patient has been taking medication as prescribed and there has been no recent change in medication or diet.  No recent antibiotics.  There has been no recent illness, hospitalizations, travel or sick contacts.    Assessment & Plan:   Active Problems:   CAP (community acquired pneumonia)   #. Community Acquired Pneumonia  Acute Hypoxic Respiratory Failure  Diffuse Ground Glass Airspace Opacities - Immunocompromised, on chemo - recent course of steroids and bactrim for COPD exacerbation - Covid19 negative, but concern given imaging findings and time course (sx started about 10 days ago) and significant increase in O2 overnight - she denies fever, loss of smell or taste, or sick/covid19 contact  - maintain airborne/contact precautions, send out COVID19 testing again - narrow abx to zosyn - blood, respiratory cultures pending - flu, RVP negative - some wheezing and prior improvement on steroids, will restart - prn inhalers -  Follow-up incidental findings on CTA of the chest as above  #. Mild hypokalemia  - replace, follow  #.  History of depression and anxiety -Continue Prozac -Hold Ativan and Ambien  #. History of GERD - Continue Protonix or Prilosec  DVT prophylaxis: lovenox Code Status: full  Family Communication: declined me calling, says she'll update Disposition Plan: requires inpatient   Consultants:   none  Procedures:  none  Antimicrobials: Anti-infectives (From admission, onward)   Start     Dose/Rate Route Frequency Ordered Stop   09/08/18 1200  vancomycin (VANCOCIN) IVPB 1000 mg/200 mL premix  Status:  Discontinued     1,000 mg 200 mL/hr over 60 Minutes Intravenous Every 12 hours 09/07/18 2318 09/08/18 0902   09/08/18 0000  vancomycin (VANCOCIN) 1,500 mg in sodium chloride 0.9 % 500 mL IVPB     1,500 mg 250 mL/hr over 120 Minutes Intravenous  Once 09/07/18 2310 09/08/18 0219   09/08/18 0000  piperacillin-tazobactam (ZOSYN) IVPB 3.375 g     3.375 g 12.5 mL/hr over 240 Minutes Intravenous Every 8 hours 09/07/18 2317     09/07/18 1745  cefTRIAXone (ROCEPHIN) 2 g in sodium chloride 0.9 % 100 mL IVPB  Status:  Discontinued     2 g 200 mL/hr over 30 Minutes Intravenous Every 24 hours 09/07/18 1741 09/07/18 2259   09/07/18 1745  azithromycin (ZITHROMAX) 500 mg in sodium chloride 0.9 % 250 mL IVPB  Status:  Discontinued     500 mg 250 mL/hr over 60 Minutes Intravenous Every 24 hours 09/07/18 1741 09/07/18 2259     Subjective: SOB Sx started about 1 week ago Worsened overnight, Yvonne Lang bit better  now  Objective: Vitals:   09/07/18 2039 09/08/18 0413 09/08/18 0506 09/08/18 0730  BP: 119/78  119/88   Pulse: (!) 104  (!) 102   Resp: 19  19   Temp: 98.9 F (37.2 C)  98.9 F (37.2 C)   TempSrc: Oral  Oral   SpO2: (!) 87% 90% 94% 95%  Weight: 81.1 kg     Height: 5\' 7"  (1.702 m)       Intake/Output Summary (Last 24 hours) at 09/08/2018 0859 Last data filed at 09/08/2018 0600  Gross per 24 hour  Intake 1062.95 ml  Output -  Net 1062.95 ml   Filed Weights   09/07/18 2039  Weight: 81.1 kg    Examination:  General exam: Appears calm and comfortable  Respiratory system: coarse rhonchi, faint wheezing throughout, increased WOB, sitting up, SOB Cardiovascular system: S1 & S2 heard, RRR. Gastrointestinal system: Abdomen is nondistended, soft and nontender.  Central nervous system: Alert and oriented. No focal neurological deficits. Extremities: no lee Skin: No rashes, lesions or ulcers Psychiatry: Judgement and insight appear normal. Mood & affect appropriate.     Data Reviewed: I have personally reviewed following labs and imaging studies  CBC: Recent Labs  Lab 09/07/18 1250 09/07/18 2106 09/08/18 0607  WBC 28.9* 21.1* 13.8*  NEUTROABS 25.3*  --   --   HGB 11.7* 11.1* 10.4*  HCT 36.9 35.6* 32.9*  MCV 93.4 95.2 95.9  PLT 393 362 161   Basic Metabolic Panel: Recent Labs  Lab 09/07/18 1250 09/07/18 2106 09/08/18 0607  NA 136  --  137  K 3.0*  --  3.4*  CL 101  --  103  CO2 23  --  23  GLUCOSE 103*  --  101*  BUN 6  --  5*  CREATININE 0.65 0.52 0.45  CALCIUM 8.6*  --  8.0*  MG  --  2.1  --   PHOS  --  2.9  --    GFR: Estimated Creatinine Clearance: 89.1 mL/min (by C-G formula based on SCr of 0.45 mg/dL). Liver Function Tests: Recent Labs  Lab 09/07/18 1250  AST 30  ALT 38  ALKPHOS 282*  BILITOT 0.3  PROT 6.6  ALBUMIN 2.8*   No results for input(s): LIPASE, AMYLASE in the last 168 hours. No results for input(s): AMMONIA in the last 168 hours. Coagulation Profile: Recent Labs  Lab 09/07/18 1555  INR 0.9   Cardiac Enzymes: No results for input(s): CKTOTAL, CKMB, CKMBINDEX, TROPONINI in the last 168 hours. BNP (last 3 results) No results for input(s): PROBNP in the last 8760 hours. HbA1C: No results for input(s): HGBA1C in the last 72 hours. CBG: No results for input(s): GLUCAP in the last 168 hours. Lipid Profile: No  results for input(s): CHOL, HDL, LDLCALC, TRIG, CHOLHDL, LDLDIRECT in the last 72 hours. Thyroid Function Tests: No results for input(s): TSH, T4TOTAL, FREET4, T3FREE, THYROIDAB in the last 72 hours. Anemia Panel: No results for input(s): VITAMINB12, FOLATE, FERRITIN, TIBC, IRON, RETICCTPCT in the last 72 hours. Sepsis Labs: Recent Labs  Lab 09/07/18 1813  LATICACIDVEN 2.3*    Recent Results (from the past 240 hour(s))  SARS Coronavirus 2 (CEPHEID - Performed in [facility] lab), Hosp Order     Status: None   Collection Time: 09/07/18  3:56 PM   Specimen: Nasopharyngeal Swab  Result Value Ref Range Status   SARS Coronavirus 2 NEGATIVE NEGATIVE Final    Comment: (NOTE) If result is NEGATIVE SARS-CoV-2 target nucleic  acids are NOT DETECTED. The SARS-CoV-2 RNA is generally detectable in upper and lower  respiratory specimens during the acute phase of infection. The lowest  concentration of SARS-CoV-2 viral copies this assay can detect is 250  copies / mL. Yvonne Lang negative result does not preclude SARS-CoV-2 infection  and should not be used as the sole basis for treatment or other  patient management decisions.  Yvonne Lang negative result may occur with  improper specimen collection / handling, submission of specimen other  than nasopharyngeal swab, presence of viral mutation(s) within the  areas targeted by this assay, and inadequate number of viral copies  (<250 copies / mL). Yvonne Lang negative result must be combined with clinical  observations, patient history, and epidemiological information. If result is POSITIVE SARS-CoV-2 target nucleic acids are DETECTED. The SARS-CoV-2 RNA is generally detectable in upper and lower  respiratory specimens dur ing the acute phase of infection.  Positive  results are indicative of active infection with SARS-CoV-2.  Clinical  correlation with patient history and other diagnostic information is  necessary to determine patient infection status.  Positive  results do  not rule out bacterial infection or co-infection with other viruses. If result is PRESUMPTIVE POSTIVE SARS-CoV-2 nucleic acids MAY BE PRESENT.   Yvonne Lang presumptive positive result was obtained on the submitted specimen  and confirmed on repeat testing.  While 2019 novel coronavirus  (SARS-CoV-2) nucleic acids may be present in the submitted sample  additional confirmatory testing may be necessary for epidemiological  and / or clinical management purposes  to differentiate between  SARS-CoV-2 and other Sarbecovirus currently known to infect humans.  If clinically indicated additional testing with an alternate test  methodology [phone]) is advised. The SARS-CoV-2 RNA is generally  detectable in upper and lower respiratory sp ecimens during the acute  phase of infection. The expected result is Negative. Fact Sheet for Patients:  StrictlyIdeas.no Fact Sheet for Healthcare Providers: BankingDealers.co.za This test is not yet approved or cleared by the [place] FDA and has been authorized for detection and/or diagnosis of SARS-CoV-2 by FDA under an Emergency Use Authorization (EUA).  This EUA will remain in effect (meaning this test can be used) for the duration of the COVID-19 declaration under Section 564(b)(1) of the Act, 21 U.S.C. section 360bbb-3(b)(1), unless the authorization is terminated or revoked sooner. Performed at [facility], [place] [address] [place], [place] [address]   Culture, blood (Routine X 2) w Reflex to ID Panel     Status: None (Preliminary result)   Collection Time: 09/07/18  5:42 PM   Specimen: BLOOD  Result Value Ref Range Status   Specimen Description   Final    BLOOD RIGHT ANTECUBITAL Performed at [facility] [address], [place], [place] [address]    Special Requests   Final    BOTTLES DRAWN AEROBIC AND ANAEROBIC Blood Culture adequate volume Performed  at [facility] [address], [place], [place] [address]    Culture   Final    NO GROWTH < 12 HOURS Performed at [facility] [address], [place], [place] [address]    Report Status PENDING  Incomplete  Culture, blood (Routine X 2) w Reflex to ID Panel     Status: None (Preliminary result)   Collection Time: 09/07/18  5:55 PM   Specimen: BLOOD  Result Value Ref Range Status   Specimen Description   Final    BLOOD RIGHT PORTA CATH CHEST Performed at  [facility], [place] [address] [place], [place] [address]    Special Requests   Final    BOTTLES DRAWN AEROBIC AND ANAEROBIC Blood Culture adequate volume Performed at [facility] [address], [place], [place] [address]    Culture   Final    NO GROWTH < 12 HOURS Performed at [facility] [address], [place], [place] [address]    Report Status PENDING  Incomplete  Respiratory Panel by PCR     Status: None   Collection Time: 09/07/18 11:01 PM   Specimen: Nasopharyngeal Swab; Respiratory  Result Value Ref Range Status   Adenovirus NOT DETECTED NOT DETECTED Final   Coronavirus 229E NOT DETECTED NOT DETECTED Final    Comment: (NOTE) The Coronavirus on the Respiratory Panel, DOES NOT test for the novel  Coronavirus (2019 nCoV)    Coronavirus HKU1 NOT DETECTED NOT DETECTED Final   Coronavirus NL63 NOT DETECTED NOT DETECTED Final   Coronavirus OC43 NOT DETECTED NOT DETECTED Final   Metapneumovirus NOT DETECTED NOT DETECTED Final   Rhinovirus / Enterovirus NOT DETECTED NOT DETECTED Final   Influenza Yvonne Lang NOT DETECTED NOT DETECTED Final   Influenza B NOT DETECTED NOT DETECTED Final   Parainfluenza Virus 1 NOT DETECTED NOT DETECTED Final   Parainfluenza Virus 2 NOT DETECTED NOT DETECTED Final   Parainfluenza Virus 3 NOT DETECTED NOT DETECTED Final   Parainfluenza Virus 4 NOT DETECTED NOT DETECTED Final   Respiratory Syncytial Virus NOT DETECTED NOT DETECTED  Final   Bordetella pertussis NOT DETECTED NOT DETECTED Final   Chlamydophila pneumoniae NOT DETECTED NOT DETECTED Final   Mycoplasma pneumoniae NOT DETECTED NOT DETECTED Final    Comment: Performed at [facility], [address], [place], [place] [address]  MRSA PCR Screening     Status: None   Collection Time: 09/08/18 12:33 AM   Specimen: Nasal Mucosa; Nasopharyngeal  Result Value Ref Range Status   MRSA by PCR NEGATIVE NEGATIVE Final    Comment:        The GeneXpert MRSA Assay (FDA approved for NASAL specimens only), is one component of Yvonne Lang comprehensive MRSA colonization surveillance program. It is not intended to diagnose MRSA infection nor to guide or monitor treatment for MRSA infections. Performed at [facility], [place] [address] [place],  [address]          Radiology Studies: Dg Chest 2 View  Result Date: 09/07/2018 CLINICAL DATA:  Shortness of breath EXAM: CHEST - 2 VIEW COMPARISON:  08/29/2018, CT 07/21/2018 FINDINGS: Right-sided central venous port tip over the cavoatrial region. Right parahilar architectural distortion with cavitary lesion. Slight increased ground-glass opacity at the periphery. New ground-glass opacity within the lingula and left lung base. Stable heart size. No pneumothorax. IMPRESSION: 1. New ground-glass opacity within the lingula, left base and right upper lobe, suspect for multifocal infection, to include possible atypical or viral pneumonia. 2. Right hilar/perihilar architectural distortion with cavitary lesion as noted on previous CT examinations. Electronically Signed   By: Donavan Foil M.D.   On: 09/07/2018 16:02   Ct Angio Chest Pe W/cm &/or Wo Cm  Result Date: 09/07/2018 CLINICAL DATA:  Lung cancer.  Shortness of breath. EXAM: CT ANGIOGRAPHY CHEST WITH CONTRAST TECHNIQUE: Multidetector CT imaging of the chest was performed using the standard protocol during bolus administration of intravenous contrast.  Multiplanar CT image reconstructions and MIPs were obtained to evaluate the vascular anatomy. CONTRAST:  140mL OMNIPAQUE IOHEXOL 350  MG/ML SOLN COMPARISON:  CT dated 04/28/2018. CT from Jul 21, 2018 could not be retrieved for comparison. FINDINGS: Cardiovascular: There is Yvonne Lang well-positioned right-sided Port-Yvonne Deliz-Cath. Evaluation for pulmonary emboli is severely limited by motion artifact and suboptimal contrast bolus timing. The main pulmonary artery as well as the left and right pulmonary arteries are dilated. The heart size is normal. There is no significant pericardial effusion. Coronary artery calcifications are noted. Mediastinum/Nodes: Again noted are pathologically enlarged mediastinal lymph nodes. There is Deolinda Frid right superior paratracheal lymph node measuring approximately 1.6 cm (previously measuring 1.3 cm. There is an interval increase in size of multiple left AP window lymph nodes when compared to CT from February 2020. There are pathologically enlarged right hilar lymph nodes. Amorphous soft tissues again noted in the right perihilar region. There are no pathologically enlarged axillary lymph nodes. No pathologically enlarged supraclavicular lymph nodes. Lungs/Pleura: There are ground-glass airspace opacities involving the left upper, left lower, and right upper lobes. There is Kanav Kazmierczak 4.2 cm cavitary air-filled structure in the right lower lobe. There is improved aeration within the right middle lobe when compared to CT from April 23, 2018. There is Breon Rehm small right-sided pleural effusion. Upper Abdomen: Again noted is Lissett Favorite right adrenal mass measuring approximately 3.1 x 3 cm. The left adrenal gland is only partially visualized and is unremarkable. There is mild intrahepatic biliary ductal dilatation which appears stable from prior studies. There are surgical clips near the GE junction. 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 Musculoskeletal: There is Seaborn Nakama growing soft tissue nodule in the anterior  midline chest measuring approximately 1.5 cm (axial series 4, image 73). There is no acute displaced fracture. Review of the MIP images confirms the above findings. IMPRESSION: 1. Detection of pulmonary emboli is severely limited by motion artifact and suboptimal contrast bolus timing. Given this limitation, no PE was identified. The pulmonary arteries are dilated which can be seen in patients with elevated pulmonary artery pressures. 2. Diffuse ground-glass airspace opacities involving the right upper lobe, left upper lobe, and left lower lobe. Findings are concerning for multifocal pneumonia (viral or bacterial). 3. Persistent ill-defined soft tissue in the right perihilar region with multiple pathologically enlarged mediastinal and hilar lymph nodes. There is Takuma Cifelli growing air-filled cavitary area within the right perihilar region which may be related to post treatment changes. 4. Small right-sided pleural effusion. 5. Growing subcutaneous soft tissue nodule in the anterior midline left chest wall as detailed above. This could represent Anshu Wehner metastatic deposit. Again noted is Korra Christine right adrenal metastatic lesion as detailed above. Aortic Atherosclerosis (ICD10-I70.0). Electronically Signed   By: Constance Holster M.D.   On: 09/07/2018 17:08        Scheduled Meds: . enoxaparin (LOVENOX) injection  40 mg Subcutaneous Q24H  . FLUoxetine  80 mg Oral Daily  . ipratropium-albuterol  3 mL Nebulization TID  . methylPREDNISolone (SOLU-MEDROL) injection  40 mg Intravenous Q12H  . nystatin  5 mL Mouth/Throat TID  . pantoprazole  40 mg Oral Daily  . sucralfate  1 g Oral TID WC & HS   Continuous Infusions: . sodium chloride 10 mL/hr (09/07/18 1554)  . sodium chloride 75 mL/hr at 09/07/18 2048  . piperacillin-tazobactam (ZOSYN)  IV 3.375 g (09/08/18 0019)  . vancomycin       LOS: 1 day    Time spent: 40 min critical care time.  AHRF, worsening.      Fayrene Helper, MD [facility] Pager AMION   If 7PM-7AM,  please contact night-coverage [email or web address] Password [email or web address] 09/08/2018, 8:59 AM

## 2018-09-08 NOTE — Progress Notes (Signed)
Spoke with Dr. Florene Glen and informed him of the second Covid pcr result (negative). He would like to keep her on full airborne precautions at this time and will reevaluate tomorrow 6/20. RN currently weaning down O2 for patient. HFNC 8L O2 at 95%.

## 2018-09-08 NOTE — Telephone Encounter (Signed)
No los per 6/18.

## 2018-09-09 LAB — COMPREHENSIVE METABOLIC PANEL
ALT: 23 U/L (ref 0–44)
AST: 20 U/L (ref 15–41)
Albumin: 2.5 g/dL — ABNORMAL LOW (ref 3.5–5.0)
Alkaline Phosphatase: 211 U/L — ABNORMAL HIGH (ref 38–126)
Anion gap: 9 (ref 5–15)
BUN: 6 mg/dL (ref 6–20)
CO2: 28 mmol/L (ref 22–32)
Calcium: 8.6 mg/dL — ABNORMAL LOW (ref 8.9–10.3)
Chloride: 102 mmol/L (ref 98–111)
Creatinine, Ser: 0.41 mg/dL — ABNORMAL LOW (ref 0.44–1.00)
GFR calc Af Amer: >60 mL/min (ref >60)
GFR calc non Af Amer: >60 mL/min (ref >60)
Glucose, Bld: 118 mg/dL — ABNORMAL HIGH (ref 70–99)
Potassium: 5 mmol/L (ref 3.5–5.1)
Sodium: 139 mmol/L (ref 135–145)
Total Bilirubin: 0.3 mg/dL (ref 0.3–1.2)
Total Protein: 6.4 g/dL — ABNORMAL LOW (ref 6.5–8.1)

## 2018-09-09 LAB — MAGNESIUM: Magnesium: 2.4 mg/dL (ref 1.7–2.4)

## 2018-09-09 LAB — CBC
HCT: 34.7 % — ABNORMAL LOW (ref 36.0–46.0)
Hemoglobin: 10.4 g/dL — ABNORMAL LOW (ref 12.0–15.0)
MCH: 28.8 pg (ref 26.0–34.0)
MCHC: 30 g/dL (ref 30.0–36.0)
MCV: 96.1 fL (ref 80.0–100.0)
Platelets: 387 10*3/uL (ref 150–400)
RBC: 3.61 MIL/uL — ABNORMAL LOW (ref 3.87–5.11)
RDW: 14.5 % (ref 11.5–15.5)
WBC: 11.8 10*3/uL — ABNORMAL HIGH (ref 4.0–10.5)
nRBC: 0 % (ref 0.0–0.2)

## 2018-09-09 LAB — SEDIMENTATION RATE: Sed Rate: 70 mm/hr — ABNORMAL HIGH (ref 0–22)

## 2018-09-09 LAB — PROCALCITONIN: Procalcitonin: <0.1 ng/mL

## 2018-09-09 LAB — C-REACTIVE PROTEIN: CRP: 14.4 mg/dL — ABNORMAL HIGH (ref <1.0)

## 2018-09-09 MED ORDER — ZOLPIDEM TARTRATE 5 MG PO TABS
5.0000 mg | ORAL_TABLET | Freq: Once | ORAL | Status: AC
  Administered 2018-09-09: 5 mg via ORAL
  Filled 2018-09-09: qty 1

## 2018-09-09 MED ORDER — SODIUM CHLORIDE 0.9 % IV BOLUS
250.0000 mL | Freq: Once | INTRAVENOUS | Status: AC
  Administered 2018-09-09: 250 mL via INTRAVENOUS

## 2018-09-09 MED ORDER — BUTALBITAL-APAP-CAFFEINE 50-325-40 MG PO TABS
1.0000 | ORAL_TABLET | Freq: Two times a day (BID) | ORAL | Status: DC | PRN
  Administered 2018-09-14 (×2): 1 via ORAL
  Filled 2018-09-09: qty 2
  Filled 2018-09-09 (×2): qty 1

## 2018-09-09 MED ORDER — PROCHLORPERAZINE EDISYLATE 10 MG/2ML IJ SOLN
10.0000 mg | Freq: Once | INTRAMUSCULAR | Status: AC
  Administered 2018-09-09: 10 mg via INTRAVENOUS
  Filled 2018-09-09: qty 2

## 2018-09-09 MED ORDER — KETOROLAC TROMETHAMINE 30 MG/ML IJ SOLN
15.0000 mg | Freq: Once | INTRAMUSCULAR | Status: AC
  Administered 2018-09-09: 15 mg via INTRAVENOUS
  Filled 2018-09-09: qty 1

## 2018-09-09 MED ORDER — DIPHENHYDRAMINE HCL 25 MG PO CAPS
25.0000 mg | ORAL_CAPSULE | Freq: Once | ORAL | Status: AC
  Administered 2018-09-09: 25 mg via ORAL
  Filled 2018-09-09: qty 1

## 2018-09-09 NOTE — Progress Notes (Signed)
PROGRESS NOTE    Yvonne Lang  JGO:[id number] DOB: 1966/01/10 DOA: 09/07/2018 PCP: Redmond School, MD   Brief Narrative: Yvonne Lang is Yvonne Lang 53 y.o. female with Yvonne Lang known history of COPD, depression/anxiety, GERD, irritable bowel syndrome, lung cancer presents to the emergency department for evaluation of shortness of breath.  Patient was in Genia Perin usual state of health until this afternoon when she was at her oncologist office for follow-up appointment and chemotherapy when she was noted to have 1 week history of increased shortness of breath, tachycardia, tachypnea and hypoxia.  Her O2 sat was around 91%.  She was recently treated with prednisone for Yvonne Lang COPD exacerbation.  Her symptoms are refractory to albuterol treatments at home.  She did not end up receiving her chemotherapy today  Patient denies fevers/chills, weakness, dizziness, chest pain, N/V/C/D, abdominal pain, dysuria/frequency, changes in mental status.    Otherwise there has been no change in status. Patient has been taking medication as prescribed and there has been no recent change in medication or diet.  No recent antibiotics.  There has been no recent illness, hospitalizations, travel or sick contacts.    Assessment & Plan:   Active Problems:   CAP (community acquired pneumonia)   #. Community Acquired Pneumonia   Acute Hypoxic Respiratory Failure   Diffuse Ground Glass Airspace Opacities - Immunocompromised, on chemo - recent course of steroids and bactrim for COPD exacerbation - Covid19 negative x 2.  No fevers since admission.  No known covid contacts.  Will d/c precautions at this time. - narrow abx to zosyn - blood, respiratory cultures pending - flu, RVP negative - negative urine strep - urine legionella is pending - elevated esr/crp - procalcitonin pending  - some wheezing and prior improvement on steroids, will restart - prn inhalers - Follow-up incidental findings on CTA of the chest as above  #. Mild  hypokalemia  - continue to monitor, resolved  #.  History of depression and anxiety -Continue Prozac -Hold Ativan and Ambien - caution with prn xanax  #. History of GERD - Continue Protonix or Prilosec  DVT prophylaxis: lovenox Code Status: full  Family Communication: declined me calling, says she'll update 6/19 Disposition Plan: requires inpatient treatment with continued ahrf   Consultants:   none  Procedures:  none  Antimicrobials: Anti-infectives (From admission, onward)   Start     Dose/Rate Route Frequency Ordered Stop   09/08/18 1200  vancomycin (VANCOCIN) IVPB 1000 mg/200 mL premix  Status:  Discontinued     1,000 mg 200 mL/hr over 60 Minutes Intravenous Every 12 hours 09/07/18 2318 09/08/18 0902   09/08/18 0000  vancomycin (VANCOCIN) 1,500 mg in sodium chloride 0.9 % 500 mL IVPB     1,500 mg 250 mL/hr over 120 Minutes Intravenous  Once 09/07/18 2310 09/08/18 0219   09/08/18 0000  piperacillin-tazobactam (ZOSYN) IVPB 3.375 g     3.375 g 12.5 mL/hr over 240 Minutes Intravenous Every 8 hours 09/07/18 2317     09/07/18 1745  cefTRIAXone (ROCEPHIN) 2 g in sodium chloride 0.9 % 100 mL IVPB  Status:  Discontinued     2 g 200 mL/hr over 30 Minutes Intravenous Every 24 hours 09/07/18 1741 09/07/18 2259   09/07/18 1745  azithromycin (ZITHROMAX) 500 mg in sodium chloride 0.9 % 250 mL IVPB  Status:  Discontinued     500 mg 250 mL/hr over 60 Minutes Intravenous Every 24 hours 09/07/18 1741 09/07/18 2259     Subjective: Continued SOB Feels better than  yesterday  Objective: Vitals:   09/09/18 1100 09/09/18 1201 09/09/18 1206 09/09/18 1300  BP: 117/83  138/88 (!) 141/78  Pulse: 86  92 83  Resp: (!) 33  (!) 29 (!) 21  Temp:  97.7 F (36.5 C)    TempSrc:  Oral    SpO2: 94%  94% 98%  Weight:      Height:        Intake/Output Summary (Last 24 hours) at 09/09/2018 1423 Last data filed at 09/09/2018 1200 Gross per 24 hour  Intake 149.23 ml  Output 600 ml  Net  -450.77 ml   Filed Weights   09/07/18 2039  Weight: 81.1 kg    Examination:  General: No acute distress. Cardiovascular: Heart sounds show Yvonne Lang regular rate, and rhythm Lungs: increased wob Abdomen: Soft, nontender, nondistended  Neurological: Alert and oriented 3. Moves all extremities 4. Cranial nerves II through XII grossly intact. Skin: Warm and dry. No rashes or lesions. Extremities: No clubbing or cyanosis. No edema.  Psychiatric: Mood and affect are normal. Insight and judgment are appropriate.     Data Reviewed: I have personally reviewed following labs and imaging studies  CBC: Recent Labs  Lab 09/07/18 1250 09/07/18 2106 09/08/18 0607 09/09/18 0449  WBC 28.9* 21.1* 13.8* 11.8*  NEUTROABS 25.3*  --  10.6*  --   HGB 11.7* 11.1* 10.4* 10.4*  HCT 36.9 35.6* 32.9* 34.7*  MCV 93.4 95.2 95.9 96.1  PLT 393 362 356 299   Basic Metabolic Panel: Recent Labs  Lab 09/07/18 1250 09/07/18 2106 09/08/18 0607 09/09/18 0449  NA 136  --  137 139  K 3.0*  --  3.4* 5.0  CL 101  --  103 102  CO2 23  --  23 28  GLUCOSE 103*  --  101* 118*  BUN 6  --  5* 6  CREATININE 0.65 0.52 0.45 0.41*  CALCIUM 8.6*  --  8.0* 8.6*  MG  --  2.1 2.0 2.4  PHOS  --  2.9  --   --    GFR: Estimated Creatinine Clearance: 89.1 mL/min (Yvonne Lang) (by C-G formula based on SCr of 0.41 mg/dL (L)). Liver Function Tests: Recent Labs  Lab 09/07/18 1250 09/09/18 0449  AST 30 20  ALT 38 23  ALKPHOS 282* 211*  BILITOT 0.3 0.3  PROT 6.6 6.4*  ALBUMIN 2.8* 2.5*   No results for input(s): LIPASE, AMYLASE in the last 168 hours. No results for input(s): AMMONIA in the last 168 hours. Coagulation Profile: Recent Labs  Lab 09/07/18 1555  INR 0.9   Cardiac Enzymes: No results for input(s): CKTOTAL, CKMB, CKMBINDEX, TROPONINI in the last 168 hours. BNP (last 3 results) No results for input(s): PROBNP in the last 8760 hours. HbA1C: No results for input(s): HGBA1C in the last 72 hours. CBG: No  results for input(s): GLUCAP in the last 168 hours. Lipid Profile: No results for input(s): CHOL, HDL, LDLCALC, TRIG, CHOLHDL, LDLDIRECT in the last 72 hours. Thyroid Function Tests: No results for input(s): TSH, T4TOTAL, FREET4, T3FREE, THYROIDAB in the last 72 hours. Anemia Panel: No results for input(s): VITAMINB12, FOLATE, FERRITIN, TIBC, IRON, RETICCTPCT in the last 72 hours. Sepsis Labs: Recent Labs  Lab 09/07/18 1813  LATICACIDVEN 2.3*    Recent Results (from the past 240 hour(s))  SARS Coronavirus 2 (CEPHEID - Performed in [facility] hospital lab), Hosp Order     Status: None   Collection Time: 09/07/18  3:56 PM   Specimen: Nasopharyngeal Swab  Result Value Ref Range Status   SARS Coronavirus 2 NEGATIVE NEGATIVE Final    Comment: (NOTE) If result is NEGATIVE SARS-CoV-2 target nucleic acids are NOT DETECTED. The SARS-CoV-2 RNA is generally detectable in upper and lower  respiratory specimens during the acute phase of infection. The lowest  concentration of SARS-CoV-2 viral copies this assay can detect is 250  copies / mL. Yvonne Lang negative result does not preclude SARS-CoV-2 infection  and should not be used as the sole basis for treatment or other  patient management decisions.  Yvonne Lang negative result may occur with  improper specimen collection / handling, submission of specimen other  than nasopharyngeal swab, presence of viral mutation(s) within the  areas targeted by this assay, and inadequate number of viral copies  (<250 copies / mL). Yvonne Lang negative result must be combined with clinical  observations, patient history, and epidemiological information. If result is POSITIVE SARS-CoV-2 target nucleic acids are DETECTED. The SARS-CoV-2 RNA is generally detectable in upper and lower  respiratory specimens dur ing the acute phase of infection.  Positive  results are indicative of active infection with SARS-CoV-2.  Clinical  correlation with patient history and other diagnostic  information is  necessary to determine patient infection status.  Positive results do  not rule out bacterial infection or co-infection with other viruses. If result is PRESUMPTIVE POSTIVE SARS-CoV-2 nucleic acids MAY BE PRESENT.   Yvonne Lang presumptive positive result was obtained on the submitted specimen  and confirmed on repeat testing.  While 2019 novel coronavirus  (SARS-CoV-2) nucleic acids may be present in the submitted sample  additional confirmatory testing may be necessary for epidemiological  and / or clinical management purposes  to differentiate between  SARS-CoV-2 and other Sarbecovirus currently known to infect humans.  If clinically indicated additional testing with an alternate test  methodology [phone]) is advised. The SARS-CoV-2 RNA is generally  detectable in upper and lower respiratory sp ecimens during the acute  phase of infection. The expected result is Negative. Fact Sheet for Patients:  StrictlyIdeas.no Fact Sheet for Healthcare Providers: BankingDealers.co.za This test is not yet approved or cleared by the [place] FDA and has been authorized for detection and/or diagnosis of SARS-CoV-2 by FDA under an Emergency Use Authorization (EUA).  This EUA will remain in effect (meaning this test can be used) for the duration of the COVID-19 declaration under Section 564(b)(1) of the Act, 21 U.S.C. section 360bbb-3(b)(1), unless the authorization is terminated or revoked sooner. Performed at [facility], [place] [address], [place], [place] [address]   Culture, blood (Routine X 2) w Reflex to ID Panel     Status: None (Preliminary result)   Collection Time: 09/07/18  5:42 PM   Specimen: BLOOD  Result Value Ref Range Status   Specimen Description   Final    BLOOD RIGHT ANTECUBITAL Performed at [facility] [address] [place], [place] [address]    Special Requests   Final     BOTTLES DRAWN AEROBIC AND ANAEROBIC Blood Culture adequate volume Performed at [facility] [address], [place], [place] [address]    Culture   Final    NO GROWTH < 12 HOURS Performed at [facility] [address], [place],  [address]    Report Status PENDING  Incomplete  Culture, blood (Routine X 2) w Reflex to ID Panel     Status: None (Preliminary result)   Collection Time: 09/07/18  5:55 PM  Specimen: BLOOD  Result Value Ref Range Status   Specimen Description   Final    BLOOD RIGHT PORTA CATH CHEST Performed at [facility] [address] [place], [place] [address]    Special Requests   Final    BOTTLES DRAWN AEROBIC AND ANAEROBIC Blood Culture adequate volume Performed at [facility] [address], [place], [place] [address]    Culture   Final    NO GROWTH < 12 HOURS Performed at [facility] [address], [place], [place] [address]    Report Status PENDING  Incomplete  Respiratory Panel by PCR     Status: None   Collection Time: 09/07/18 11:01 PM   Specimen: Nasopharyngeal Swab; Respiratory  Result Value Ref Range Status   Adenovirus NOT DETECTED NOT DETECTED Final   Coronavirus 229E NOT DETECTED NOT DETECTED Final    Comment: (NOTE) The Coronavirus on the Respiratory Panel, DOES NOT test for the novel  Coronavirus (2019 nCoV)    Coronavirus HKU1 NOT DETECTED NOT DETECTED Final   Coronavirus NL63 NOT DETECTED NOT DETECTED Final   Coronavirus OC43 NOT DETECTED NOT DETECTED Final   Metapneumovirus NOT DETECTED NOT DETECTED Final   Rhinovirus / Enterovirus NOT DETECTED NOT DETECTED Final   Influenza Sahasra Belue NOT DETECTED NOT DETECTED Final   Influenza B NOT DETECTED NOT DETECTED Final   Parainfluenza Virus 1 NOT DETECTED NOT DETECTED Final   Parainfluenza Virus 2 NOT DETECTED NOT DETECTED Final   Parainfluenza Virus 3 NOT DETECTED NOT DETECTED Final   Parainfluenza Virus 4 NOT  DETECTED NOT DETECTED Final   Respiratory Syncytial Virus NOT DETECTED NOT DETECTED Final   Bordetella pertussis NOT DETECTED NOT DETECTED Final   Chlamydophila pneumoniae NOT DETECTED NOT DETECTED Final   Mycoplasma pneumoniae NOT DETECTED NOT DETECTED Final    Comment: Performed at [facility], [address] [place], [place] [address]  MRSA PCR Screening     Status: None   Collection Time: 09/08/18 12:33 AM   Specimen: Nasal Mucosa; Nasopharyngeal  Result Value Ref Range Status   MRSA by PCR NEGATIVE NEGATIVE Final    Comment:        The GeneXpert MRSA Assay (FDA approved for NASAL specimens only), is one component of Yvonne Lang comprehensive MRSA colonization surveillance program. It is not intended to diagnose MRSA infection nor to guide or monitor treatment for MRSA infections. Performed at [facility], [place] [address], [place], [place] [address]   SARS Coronavirus 2 [facility] order, Performed in [facility] hospital lab)     Status: None   Collection Time: 09/08/18 10:31 AM   Specimen: Nasopharyngeal Swab  Result Value Ref Range Status   SARS Coronavirus 2 NEGATIVE NEGATIVE Final    Comment: (NOTE) If result is NEGATIVE SARS-CoV-2 target nucleic acids are NOT DETECTED. The SARS-CoV-2 RNA is generally detectable in upper and lower  respiratory specimens during the acute phase of infection. The lowest  concentration of SARS-CoV-2 viral copies this assay can detect is 250  copies / mL. Yvonne Lang negative result does not preclude SARS-CoV-2 infection  and should not be used as the sole basis for treatment or other  patient management decisions.  Yvonne Lang negative result may occur with  improper specimen collection / handling, submission of specimen other  than nasopharyngeal swab, presence of viral mutation(s) within the  areas targeted by this assay, and inadequate number of viral copies  (<250 copies / mL). Yvonne Lang negative  result must be combined with clinical    observations, patient history, and epidemiological information. If result is POSITIVE SARS-CoV-2 target nucleic acids are DETECTED. The SARS-CoV-2 RNA is generally detectable in upper and lower  respiratory specimens dur ing the acute phase of infection.  Positive  results are indicative of active infection with SARS-CoV-2.  Clinical  correlation with patient history and other diagnostic information is  necessary to determine patient infection status.  Positive results do  not rule out bacterial infection or co-infection with other viruses. If result is PRESUMPTIVE POSTIVE SARS-CoV-2 nucleic acids MAY BE PRESENT.   Yvonne Lang presumptive positive result was obtained on the submitted specimen  and confirmed on repeat testing.  While 2019 novel coronavirus  (SARS-CoV-2) nucleic acids may be present in the submitted sample  additional confirmatory testing may be necessary for epidemiological  and / or clinical management purposes  to differentiate between  SARS-CoV-2 and other Sarbecovirus currently known to infect humans.  If clinically indicated additional testing with an alternate test  methodology [phone]) is advised. The SARS-CoV-2 RNA is generally  detectable in upper and lower respiratory sp ecimens during the acute  phase of infection. The expected result is Negative. Fact Sheet for Patients:  StrictlyIdeas.no Fact Sheet for Healthcare Providers: BankingDealers.co.za This test is not yet approved or cleared by the [place] FDA and has been authorized for detection and/or diagnosis of SARS-CoV-2 by FDA under an Emergency Use Authorization (EUA).  This EUA will remain in effect (meaning this test can be used) for the duration of the COVID-19 declaration under Section 564(b)(1) of the Act, 21 U.S.C. section 360bbb-3(b)(1), unless the authorization is terminated or revoked sooner. Performed at [facility], [place]  [address] [place], [place] [address]   MRSA PCR Screening     Status: None   Collection Time: 09/08/18 11:45 AM   Specimen: Nasal Mucosa; Nasopharyngeal  Result Value Ref Range Status   MRSA by PCR NEGATIVE NEGATIVE Final    Comment:        The GeneXpert MRSA Assay (FDA approved for NASAL specimens only), is one component of Yvonne Lang comprehensive MRSA colonization surveillance program. It is not intended to diagnose MRSA infection nor to guide or monitor treatment for MRSA infections. Performed at [facility], [place] [address] [place], [place] [address]   Culture, sputum-assessment     Status: None   Collection Time: 09/08/18  8:00 PM   Specimen: Nasopharyngeal Swab; Sputum  Result Value Ref Range Status   Specimen Description EXPECTORATED SPUTUM  Final   Special Requests Immunocompromised  Final   Sputum evaluation   Final    THIS SPECIMEN IS ACCEPTABLE FOR SPUTUM CULTURE Performed at [facility], [place] [address] [place], [place] [address]    Report Status 09/08/2018 FINAL  Final  Culture, respiratory     Status: None (Preliminary result)   Collection Time: 09/08/18  8:00 PM  Result Value Ref Range Status   Specimen Description   Final    EXPECTORATED SPUTUM Performed at [facility] [address], [place], [place] [address]    Special Requests   Final    Immunocompromised Reflexed from [phone] Performed at [facility], [place] [address], [place], [place] [address]    Gram Stain   Final    FEW WBC PRESENT, PREDOMINANTLY PMN NO ORGANISMS SEEN Performed at [facility], [place] [address], [place], [place] [address]    Culture PENDING  Incomplete   Report Status PENDING  Incomplete         Radiology Studies: Dg Chest 2 View  Result Date: 09/07/2018 CLINICAL DATA:  Shortness of breath EXAM: CHEST - 2 VIEW COMPARISON:  08/29/2018, CT 07/21/2018 FINDINGS: Right-sided central venous port tip  over the cavoatrial region. Right parahilar architectural distortion with cavitary lesion. Slight increased ground-glass opacity at the periphery. New ground-glass opacity within the lingula and left lung base. Stable heart size. No pneumothorax. IMPRESSION: 1. New ground-glass opacity within the lingula, left base and right upper lobe, suspect for multifocal infection, to include possible atypical or viral pneumonia. 2. Right hilar/perihilar architectural distortion with cavitary lesion as noted on previous CT examinations. Electronically Signed   By: Donavan Foil M.D.   On: 09/07/2018 16:02   Ct Angio Chest Pe W/cm &/or Wo Cm  Result Date: 09/07/2018 CLINICAL DATA:  Lung cancer.  Shortness of breath. EXAM: CT ANGIOGRAPHY CHEST WITH CONTRAST TECHNIQUE: Multidetector CT imaging of the chest was performed using the standard protocol during bolus administration of intravenous contrast. Multiplanar CT image reconstructions and MIPs were obtained to evaluate the vascular anatomy. CONTRAST:  137m OMNIPAQUE IOHEXOL 350 MG/ML SOLN COMPARISON:  CT dated 04/28/2018. CT from Jul 21, 2018 could not be retrieved for comparison. FINDINGS: Cardiovascular: There is Yvonne Lang well-positioned right-sided [place] Bains-Cath. Evaluation for pulmonary emboli is severely limited by motion artifact and suboptimal contrast bolus timing. The main pulmonary artery as well as the left and right pulmonary arteries are dilated. The heart size is normal. There is no significant pericardial effusion. Coronary artery calcifications are noted. Mediastinum/Nodes: Again noted are pathologically enlarged mediastinal lymph nodes. There is Mae Denunzio right superior paratracheal lymph node measuring approximately 1.6 cm (previously measuring 1.3 cm. There is an interval increase in size of multiple left AP window lymph nodes when compared to CT from February 2020. There are pathologically enlarged right hilar lymph nodes. Amorphous soft tissues again noted in the right  perihilar region. There are no pathologically enlarged axillary lymph nodes. No pathologically enlarged supraclavicular lymph nodes. Lungs/Pleura: There are ground-glass airspace opacities involving the left upper, left lower, and right upper lobes. There is Keshia Weare 4.2 cm cavitary air-filled structure in the right lower lobe. There is improved aeration within the right middle lobe when compared to CT from April 23, 2018. There is Ashtan Laton small right-sided pleural effusion. Upper Abdomen: Again noted is Jamarian Jacinto right adrenal mass measuring approximately 3.1 x 3 cm. The left adrenal gland is only partially visualized and is unremarkable. There is mild intrahepatic biliary ductal dilatation which appears stable from prior studies. There are surgical clips near the GE junction. 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 Musculoskeletal: There is Raynie Steinhaus growing soft tissue nodule in the anterior midline chest measuring approximately 1.5 cm (axial series 4, image 73). There is no acute displaced fracture. Review of the MIP images confirms the above findings. IMPRESSION: 1. Detection of pulmonary emboli is severely limited by motion artifact and suboptimal contrast bolus timing. Given this limitation, no PE was identified. The pulmonary arteries are dilated which can be seen in patients with elevated pulmonary artery pressures. 2. Diffuse ground-glass airspace opacities involving the right upper lobe, left upper lobe, and left lower lobe. Findings are concerning for multifocal pneumonia (viral or bacterial). 3. Persistent ill-defined soft tissue in the right perihilar region with multiple pathologically enlarged mediastinal and hilar lymph nodes. There is Layne Lebon growing air-filled cavitary area within the right perihilar region which may be  related to post treatment changes. 4. Small right-sided pleural effusion. 5. Growing subcutaneous soft tissue nodule in the anterior midline left chest wall as detailed above. This could  represent Pleasant Britz metastatic deposit. Again noted is Murl Golladay right adrenal metastatic lesion as detailed above. Aortic Atherosclerosis (ICD10-I70.0). Electronically Signed   By: Constance Holster M.D.   On: 09/07/2018 17:08        Scheduled Meds:  chlorhexidine  15 mL Mouth Rinse BID   Chlorhexidine Gluconate Cloth  6 each Topical Q0600   enoxaparin (LOVENOX) injection  40 mg Subcutaneous Q24H   FLUoxetine  80 mg Oral Daily   mouth rinse  15 mL Mouth Rinse q12n4p   methylPREDNISolone (SOLU-MEDROL) injection  40 mg Intravenous Q12H   nystatin  5 mL Mouth/Throat TID   pantoprazole  40 mg Oral Daily   sucralfate  1 g Oral TID WC & HS   Continuous Infusions:  piperacillin-tazobactam (ZOSYN)  IV Stopped (09/09/18 1145)     LOS: 2 days    Time spent: over 30 min    Fayrene Helper, MD [facility] Pager AMION  If 7PM-7AM, please contact night-coverage [email or web address] Password [email or web address] 09/09/2018, 2:23 PM

## 2018-09-10 LAB — COMPREHENSIVE METABOLIC PANEL
ALT: 19 U/L (ref 0–44)
AST: 15 U/L (ref 15–41)
Albumin: 2.5 g/dL — ABNORMAL LOW (ref 3.5–5.0)
Alkaline Phosphatase: 180 U/L — ABNORMAL HIGH (ref 38–126)
Anion gap: 9 (ref 5–15)
BUN: <5 mg/dL — ABNORMAL LOW (ref 6–20)
CO2: 28 mmol/L (ref 22–32)
Calcium: 8.4 mg/dL — ABNORMAL LOW (ref 8.9–10.3)
Chloride: 101 mmol/L (ref 98–111)
Creatinine, Ser: 0.41 mg/dL — ABNORMAL LOW (ref 0.44–1.00)
GFR calc Af Amer: >60 mL/min (ref >60)
GFR calc non Af Amer: >60 mL/min (ref >60)
Glucose, Bld: 118 mg/dL — ABNORMAL HIGH (ref 70–99)
Potassium: 3.6 mmol/L (ref 3.5–5.1)
Sodium: 138 mmol/L (ref 135–145)
Total Bilirubin: 0.2 mg/dL — ABNORMAL LOW (ref 0.3–1.2)
Total Protein: 6 g/dL — ABNORMAL LOW (ref 6.5–8.1)

## 2018-09-10 LAB — LEGIONELLA PNEUMOPHILA SEROGP 1 UR AG: L. pneumophila Serogp 1 Ur Ag: NEGATIVE

## 2018-09-10 LAB — CBC
HCT: 34.9 % — ABNORMAL LOW (ref 36.0–46.0)
Hemoglobin: 10.5 g/dL — ABNORMAL LOW (ref 12.0–15.0)
MCH: 28.7 pg (ref 26.0–34.0)
MCHC: 30.1 g/dL (ref 30.0–36.0)
MCV: 95.4 fL (ref 80.0–100.0)
Platelets: 431 10*3/uL — ABNORMAL HIGH (ref 150–400)
RBC: 3.66 MIL/uL — ABNORMAL LOW (ref 3.87–5.11)
RDW: 13.8 % (ref 11.5–15.5)
WBC: 12.7 10*3/uL — ABNORMAL HIGH (ref 4.0–10.5)
nRBC: 0 % (ref 0.0–0.2)

## 2018-09-10 LAB — MAGNESIUM: Magnesium: 1.9 mg/dL (ref 1.7–2.4)

## 2018-09-10 LAB — PROCALCITONIN: Procalcitonin: <0.1 ng/mL

## 2018-09-10 MED ORDER — CHLORHEXIDINE GLUCONATE CLOTH 2 % EX PADS
6.0000 | MEDICATED_PAD | Freq: Every day | CUTANEOUS | Status: DC
  Administered 2018-09-10: 6 via TOPICAL

## 2018-09-10 MED ORDER — IPRATROPIUM-ALBUTEROL 0.5-2.5 (3) MG/3ML IN SOLN
3.0000 mL | Freq: Four times a day (QID) | RESPIRATORY_TRACT | Status: DC
  Administered 2018-09-10: 3 mL via RESPIRATORY_TRACT
  Filled 2018-09-10: qty 3

## 2018-09-10 MED ORDER — IPRATROPIUM-ALBUTEROL 0.5-2.5 (3) MG/3ML IN SOLN
3.0000 mL | Freq: Three times a day (TID) | RESPIRATORY_TRACT | Status: DC
  Administered 2018-09-10 – 2018-09-14 (×13): 3 mL via RESPIRATORY_TRACT
  Filled 2018-09-10 (×13): qty 3

## 2018-09-10 MED ORDER — ZOLPIDEM TARTRATE 5 MG PO TABS
5.0000 mg | ORAL_TABLET | Freq: Every evening | ORAL | Status: DC | PRN
  Administered 2018-09-10 – 2018-09-14 (×4): 5 mg via ORAL
  Filled 2018-09-10 (×4): qty 1

## 2018-09-10 NOTE — Progress Notes (Addendum)
**Note De-Identified vi Obfusction** PROGRESS NOTE    Yvonne Lang  KCL:[id number] DOB: 06/25/1965 DOA: 09/07/2018 PCP: Redmond School, MD   Brief Nrrtive: Yvonne Lang is  53 y.o. femle with  known history of COPD, depression/nxiety, GERD, irritble bowel syndrome, lung cncer presents to the emergency deprtment for evlution of shortness of breth.  Ptient ws in  usul stte of helth until this fternoon when she ws t her oncologist office for follow-up ppointment nd chemotherpy when she ws noted to hve 1 week history of incresed shortness of breth, tchycrdi, tchypne nd hypoxi.  Her O2 st ws round 91%.  She ws recently treted with prednisone for  COPD excerbtion.  Her symptoms re refrctory to lbuterol tretments t home.  She did not end up receiving her chemotherpy tody  Ptient denies fevers/chills, wekness, dizziness, chest pin, N/V/C/D, bdominl pin, dysuri/frequency, chnges in mentl sttus.    Otherwise there hs been no chnge in sttus. Ptient hs been tking mediction s prescribed nd there hs been no recent chnge in mediction or diet.  No recent ntibiotics.  There hs been no recent illness, hospitliztions, trvel or sick contcts.    Assessment & Pln:   Active Problems:   CAP (community cquired pneumoni)   #. Community Acquired Pneumoni  Acute Hypoxic Respirtory Filure  Diffuse Ground Glss Airspce Opcities - Immunocompromised, on chemo - recent course of steroids nd bctrim for COPD excerbtion - ddx includes virl or bcteri pneumoni vs pneumonitis relted to chemo (per oncology, pneumonitis rre with her chemo regimen) - continued high O2 needs - Covid19 negtive x 2.  No fevers since dmission.  No known covid contcts.  Will d/c precutions t this time. - nrrow bx to zosyn  - blood, respirtory cultures pending - flu, RVP negtive - negtive urine strep - urine legionell is pending - elevted esr/crp - proclcitonin  pending, wnl - some wheezing nd prior improvement on steroids, will continue - prn nd scheduled nebs - Follow-up incidentl findings on CTA of the chest s bove  #. Mild hypoklemi  - continue to monitor, resolved  #.  History of depression nd nxiety -Continue Prozc -Hold Ativn nd Ambien - cution with prn xnx  #. History of GERD - Continue Protonix or Prilosec  # Hedche: improved with HA cocktil yesterdy, 6/20  DVT prophylxis: lovenox Code Sttus: full  Fmily Communiction: declined me clling, sys she'll updte 6/19 Disposition Pln: requires inptient tretment with continued hrf   Consultnts:   none  Procedures:  none  Antimicrobils: Anti-infectives (From dmission, onwrd)   Strt     Dose/Rte Route Frequency Ordered Stop   09/08/18 1200  vncomycin (VANCOCIN) IVPB 1000 mg/200 mL premix  Sttus:  Discontinued     1,000 mg 200 mL/hr over 60 Minutes Intrvenous Every 12 hours 09/07/18 2318 09/08/18 0902   09/08/18 0000  vncomycin (VANCOCIN) 1,500 mg in sodium chloride 0.9 % 500 mL IVPB     1,500 mg 250 mL/hr over 120 Minutes Intrvenous  Once 09/07/18 2310 09/08/18 0219   09/08/18 0000  pipercillin-tzobctm (ZOSYN) IVPB 3.375 g     3.375 g 12.5 mL/hr over 240 Minutes Intrvenous Every 8 hours 09/07/18 2317     09/07/18 1745  cefTRIAXone (ROCEPHIN) 2 g in sodium chloride 0.9 % 100 mL IVPB  Sttus:  Discontinued     2 g 200 mL/hr over 30 Minutes Intrvenous Every 24 hours 09/07/18 1741 09/07/18 2259   09/07/18 1745  zithromycin (ZITHROMAX) 500 mg in sodium chloride 0.9 % 250 **Note De-Identified vi Obfusction** mL IVPB  Sttus:  Discontinued     500 mg 250 mL/hr over 60 Minutes Intrvenous Every 24 hours 09/07/18 1741 09/07/18 2259     Subjective: H improved Feeling slightly better, sob bout the sme  Objective: Vitls:   09/10/18 0700 09/10/18 0800 09/10/18 0855 09/10/18 0900  BP: 112/69 112/86  128/88  Pulse: 78 67 88 84  Resp: (!) 21 (!) _0 Temp:   98.5 F (36.9 C)    TempSrc:  Orl    SpO2: 97% 98% 97% 100%  Weight:      Height:        Intke/Output Summry (Lst 24 hours) t 09/10/2018 1257 Lst dt filed t 09/10/2018 0338 Gross per 24 hour  Intke 348.97 ml  Output -  Net 348.97 ml   Filed Weights   09/07/18 2039  Weight: 81.1 kg    Exmintion:  Generl: No cute distress. Crdiovsculr: Hert sounds show  regulr rte, nd rhythm.  Lungs: diffuse mild expirtory wheezes, incresed wob bdomen: Soft, nontender, nondistended  Neurologicl: lert nd oriented 3. Moves ll extremities 4 with equl strength. Crnil nerves II through XII grossly intct. Skin: Wrm nd dry. No rshes or lesions. Extremities: No clubbing or cynosis. No edem.    Dt Reviewed: I hve personlly reviewed following lbs nd imging studies  CBC: Recent Lbs  Lb 09/07/18 1250 09/07/18 2106 09/08/18 0607 09/09/18 0449 09/10/18 0327  WBC 28.9* 21.1* 13.8* 11.8* 12.7*  NEUTROBS 25.3*  --  10.6*  --   --   HGB 11.7* 11.1* 10.4* 10.4* 10.5*  HCT 36.9 35.6* 32.9* 34.7* 34.9*  MCV 93.4 95.2 95.9 96.1 95.4  PLT 393 362 356 387 101*   Bsic Metbolic Pnel: Recent Lbs  Lb 09/07/18 1250 09/07/18 2106 09/08/18 0607 09/09/18 0449 09/10/18 0327  N 136  --  137 139 138  K 3.0*  --  3.4* 5.0 3.6  CL 101  --  103 102 101  CO2 23  --  _1 GLUCOSE 103*  --  101* 118* 118*  BUN 6  --  5* 6 <5*  CRETININE 0.65 0.52 0.45 0.41* 0.41*  CLCIUM 8.6*  --  8.0* 8.6* 8.4*  MG  --  2.1 2.0 2.4 1.9  PHOS  --  2.9  --   --   --    GFR: Estimted Cretinine Clernce: 89.1 mL/min () (by [place] formul bsed on SCr of 0.41 mg/dL (L)). Liver Function Tests: Recent Lbs  Lb 09/07/18 1250 09/09/18 0449 09/10/18 0327  ST _2 LT 38 23 19  LKPHOS 282* 211* 180*  BILITOT 0.3 0.3 0.2*  PROT 6.6 6.4* 6.0*  LBUMIN 2.8* 2.5* 2.5*   No results for input(s): LIPSE, MYLSE in the lst 168 hours. No results for  input(s): MMONI in the lst 168 hours. Cogultion Profile: Recent Lbs  Lb 09/07/18 1555  INR 0.9   Crdic Enzymes: No results for input(s): CKTOTL, CKMB, CKMBINDEX, TROPONINI in the lst 168 hours. BNP (lst 3 results) No results for input(s): PROBNP in the lst 8760 hours. Hb1C: No results for input(s): HGB1C in the lst 72 hours. CBG: No results for input(s): GLUCP in the lst 168 hours. Lipid Profile: No results for input(s): CHOL, HDL, LDLCLC, TRIG, CHOLHDL, LDLDIRECT in the lst 72 hours. Thyroid Function Tests: No results for input(s): TSH, T4TOTL, FREET4, T3FREE, THYROIDB in the lst 72 hours. nemi Pnel: No results for input(s): VITMINB12, FOLTE, FERRITIN, TIBC,  IRON, RETICCTPCT in the last 72 hours. Sepsis Labs: Recent Labs  Lab 09/07/18 1813 09/09/18 0449 09/10/18 0327  PROCLCITON  --  <0.10 <0.10  LTICCIDVEN 2.3*  --   --     Recent Results (from the past 240 hour(s))  SRS Coronavirus 2 (CEPHEID - Performed in [place] hospital lab), Hosp Order     Status: None   Collection Time: 09/07/18  3:56 PM   Specimen: Nasopharyngeal Swab  Result Value Ref Range Status   SRS Coronavirus 2 NEGTIVE NEGTIVE Final    Comment: (NOTE) If result is NEGTIVE SRS-CoV-2 target nucleic acids are NOT DETECTED. The SRS-CoV-2 RN is generally detectable in upper and lower  respiratory specimens during the acute phase of infection. The lowest  concentration of SRS-CoV-2 viral copies this assay can detect is 250  copies / mL.  negative result does not preclude SRS-CoV-2 infection  and should not be used as the sole basis for treatment or other  patient management decisions.   negative result may occur with  improper specimen collection / handling, submission of specimen other  than nasopharyngeal swab, presence of viral mutation(s) within the  areas targeted by this assay, and inadequate number of viral copies  (<250 copies / mL).  negative result  must be combined with clinical  observations, patient history, and epidemiological information. If result is POSITIVE SRS-CoV-2 target nucleic acids are DETECTED. The SRS-CoV-2 RN is generally detectable in upper and lower  respiratory specimens dur ing the acute phase of infection.  Positive  results are indicative of active infection with SRS-CoV-2.  Clinical  correlation with patient history and other diagnostic information is  necessary to determine patient infection status.  Positive results do  not rule out bacterial infection or co-infection with other viruses. If result is PRESUMPTIVE POSTIVE SRS-CoV-2 nucleic acids MY BE PRESENT.    presumptive positive result was obtained on the submitted specimen  and confirmed on repeat testing.  While 2019 novel coronavirus  (SRS-CoV-2) nucleic acids may be present in the submitted sample  additional confirmatory testing may be necessary for epidemiological  and / or clinical management purposes  to differentiate between  SRS-CoV-2 and other Sarbecovirus currently known to infect humans.  If clinically indicated additional testing with an alternate test  methodology [phone]) is advised. The SRS-CoV-2 RN is generally  detectable in upper and lower respiratory sp ecimens during the acute  phase of infection. The expected result is Negative. Fact Sheet for Patients:  StrictlyIdeas.no Fact Sheet for Healthcare Providers: BankingDealers.co.za This test is not yet approved or cleared by the [place] FD and has been authorized for detection and/or diagnosis of SRS-CoV-2 by FD under an Emergency Use uthorization (EU).  This EU will remain in effect (meaning this test can be used) for the duration of the COVID-19 declaration under Section 564(b)(1) of the ct, 21 U.S.C. section 360bbb-3(b)(1), unless the authorization is terminated or revoked sooner. Performed at [facility], [place] [address] [place], [place] [address]   Culture, blood (Routine X 2) w Reflex to ID Panel     Status: None (Preliminary result)   Collection Time: 09/07/18  5:42 PM   Specimen: BLOOD  Result Value Ref Range Status   Specimen Description   Final    BLOOD RIGHT NTECUBITL Performed at [place] [address], [place], [place] [address]    Special Requests   Final    BOTTLES DRWN EROBIC ND NEROBIC Blood Culture adequate **Note De-Identified vi Obfusction** volume Performed t [facility], [place] [address] [place], [place] [address]    Culture   Finl    NO GROWTH 2 DYS Performed t [place] [address] [place], [place] [address]    Report Sttus PENDING  Incomplete  Culture, blood (Routine X 2) w Reflex to ID Pnel     Sttus: None (Preliminry result)   Collection Time: 09/07/18  5:55 PM   Specimen: BLOOD  Result Vlue Ref Rnge Sttus   Specimen Description   Finl    BLOOD RIGHT PORT CTH CHEST Performed t [place] [address], [place], [place] [address]    Specil Requests   Finl    BOTTLES DRWN EROBIC ND NEROBIC Blood Culture dequte volume Performed t [place] [address], [place], [place] [address]    Culture   Finl    NO GROWTH 2 DYS Performed t [place] [address], [place], [place] [address]    Report Sttus PENDING  Incomplete  Respirtory Pnel by PCR     Sttus: None   Collection Time: 09/07/18 11:01 PM   Specimen: Nsophryngel Swb; Respirtory  Result Vlue Ref Rnge Sttus   denovirus NOT DETECTED NOT DETECTED Finl   Coronvirus 229E NOT DETECTED NOT DETECTED Finl    Comment: (NOTE) The Coronvirus on the Respirtory Pnel, DOES NOT test for the novel  Coronvirus (2019 nCoV)    Coronvirus HKU1 NOT DETECTED NOT DETECTED Finl   Coronvirus NL63 NOT DETECTED NOT DETECTED Finl   Coronvirus OC43 NOT DETECTED NOT  DETECTED Finl   Metpneumovirus NOT DETECTED NOT DETECTED Finl   Rhinovirus / Enterovirus NOT DETECTED NOT DETECTED Finl   Influenz  NOT DETECTED NOT DETECTED Finl   Influenz B NOT DETECTED NOT DETECTED Finl   Prinfluenz Virus 1 NOT DETECTED NOT DETECTED Finl   Prinfluenz Virus 2 NOT DETECTED NOT DETECTED Finl   Prinfluenz Virus 3 NOT DETECTED NOT DETECTED Finl   Prinfluenz Virus 4 NOT DETECTED NOT DETECTED Finl   Respirtory Syncytil Virus NOT DETECTED NOT DETECTED Finl   Bordetell pertussis NOT DETECTED NOT DETECTED Finl   Chlmydophil pneumonie NOT DETECTED NOT DETECTED Finl   Mycoplsm pneumonie NOT DETECTED NOT DETECTED Finl    Comment: Performed t [facility], [address] [place], [place] [address]  MRS PCR Screening     Sttus: None   Collection Time: 09/08/18 12:33 M   Specimen: Nsl Mucos; Nsophryngel  Result Vlue Ref Rnge Sttus   MRS by PCR NEGTIVE NEGTIVE Finl    Comment:        The GeneXpert MRS ssy (FD pproved for NSL specimens only), is one component of  comprehensive MRS coloniztion surveillnce progrm. It is not intended to dignose MRS infection nor to guide or monitor tretment for MRS infections. Performed t [facility], [place] [address] [place], [place] [address]   SRS Coronvirus 2 [facility] order, Performed in [facility] lb)     Sttus: None   Collection Time: 09/08/18 10:31 M   Specimen: Nsophryngel Swb  Result Vlue Ref Rnge Sttus   SRS Coronvirus 2 NEGTIVE NEGTIVE Finl    Comment: (NOTE) If result is NEGTIVE SRS-CoV-2 trget nucleic cids re NOT DETECTED. The SRS-CoV-2 RN is generlly detectble in upper nd lower  respirtory specimens during the cute phse of infection. The lowest  concentrtion of SRS-CoV-2 virl copies this ssy cn detect is **Note De-Identified vi Obfusction** 250  copies / mL.  negtive result does not preclude SRS-CoV-2 infection  nd  should not be used s the sole bsis for tretment or other  ptient mngement decisions.   negtive result my occur with  improper specimen collection / hndling, submission of specimen other  thn nsophryngel swb, presence of virl muttion(s) within the  res trgeted by this ssy, nd indequte number of virl copies  (<250 copies / mL).  negtive result must be combined with clinicl  observtions, ptient history, nd epidemiologicl informtion. If result is POSITIVE SRS-CoV-2 trget nucleic cids re DETECTED. The SRS-CoV-2 RN is generlly detectble in upper nd lower  respirtory specimens dur ing the cute phse of infection.  Positive  results re indictive of ctive infection with SRS-CoV-2.  Clinicl  correltion with ptient history nd other dignostic informtion is  necessry to determine ptient infection sttus.  Positive results do  not rule out bcteril infection or co-infection with other viruses. If result is PRESUMPTIVE POSTIVE SRS-CoV-2 nucleic cids MY BE PRESENT.    presumptive positive result ws obtined on the submitted specimen  nd confirmed on repet testing.  While 2019 novel coronvirus  (SRS-CoV-2) nucleic cids my be present in the submitted smple  dditionl confirmtory testing my be necessry for epidemiologicl  nd / or clinicl mngement purposes  to differentite between  SRS-CoV-2 nd other Srbecovirus currently known to infect humns.  If cliniclly indicted dditionl testing with n lternte test  methodology [phone]) is dvised. The SRS-CoV-2 RN is generlly  detectble in upper nd lower respirtory sp ecimens during the cute  phse of infection. The expected result is Negtive. Fct Sheet for Ptients:  StrictlyIdes.no Fct Sheet for [facility] Providers: BnkingDelers.co.z This test is not yet pproved or clered by the [place] FD nd hs been  uthorized for detection nd/or dignosis of SRS-CoV-2 by FD under n Emergency Use uthoriztion (EU).  This EU will remin in effect (mening this test cn be used) for the durtion of the COVID-19 declrtion under Section 564(b)(1) of the ct, 21 U.S.C. section 360bbb-3(b)(1), unless the uthoriztion is terminted or revoked sooner. Performed t [facility], [place] [address], [place], [place] [address]   MRS PCR Screening     Sttus: None   Collection Time: 09/08/18 11:45 M   Specimen: Nsl Mucos; Nsophryngel  Result Vlue Ref Rnge Sttus   MRS by PCR NEGTIVE NEGTIVE Finl    Comment:        The GeneXpert MRS ssy (FD pproved for NSL specimens only), is one component of  comprehensive MRS coloniztion surveillnce progrm. It is not intended to dignose MRS infection nor to guide or monitor tretment for MRS infections. Performed t [facility], [place] [address], [place], [place] [address]   Culture, sputum-ssessment     Sttus: None   Collection Time: 09/08/18  8:00 PM   Specimen: Nsophryngel Swb; Sputum  Result Vlue Ref Rnge Sttus   Specimen Description EXPECTORTED SPUTUM  Finl   Specil Requests Immunocompromised  Finl   Sputum evlution   Finl    THIS SPECIMEN IS CCEPTBLE FOR SPUTUM CULTURE Performed t [facility], [place] [address], [place], [place] [address]    Report Sttus 09/08/2018 FINL  Finl  Culture, respirtory     Sttus: None (Preliminry result)   Collection Time: 09/08/18  8:00 PM  Result Vlue Ref Rnge Sttus   Specimen Description   Finl    EXPECTORTED SPUTUM Performed t [facility]  [place] [address] [place], [place] [address]    Special Requests   Final    Immunocompromised Reflexed from [phone] Performed at [facility], [place] [address], [place], [place] [address]    Gram Stain   Final    FEW WBC PRESENT,  PREDOMINANTLY PMN NO ORGANISMS SEEN    Culture   Final    CULTURE REINCUBATED FOR BETTER GROWTH Performed at [facility], [place] [address] [place], [place] [address]    Report Status PENDING  Incomplete         Radiology Studies: No results found.      Scheduled Meds: . chlorhexidine  15 mL Mouth Rinse BID  . Chlorhexidine Gluconate Cloth  6 each Topical Q0600  . enoxaparin (LOVENOX) injection  40 mg Subcutaneous Q24H  . FLUoxetine  80 mg Oral Daily  . ipratropium-albuterol  3 mL Nebulization TID  . mouth rinse  15 mL Mouth Rinse q12n4p  . methylPREDNISolone (SOLU-MEDROL) injection  40 mg Intravenous Q12H  . nystatin  5 mL Mouth/Throat TID  . pantoprazole  40 mg Oral Daily  . sucralfate  1 g Oral TID WC & HS   Continuous Infusions: . piperacillin-tazobactam (ZOSYN)  IV Stopped (09/10/18 1248)     LOS: 3 days    Time spent: over 30 min    Fayrene Helper, MD [facility] Pager AMION  If 7PM-7AM, please contact night-coverage [email or web address] Password [facility] 09/10/2018, 12:57 PM

## 2018-09-11 LAB — BASIC METABOLIC PANEL
Anion gap: 11 (ref 5–15)
BUN: 6 mg/dL (ref 6–20)
CO2: 19 mmol/L — ABNORMAL LOW (ref 22–32)
Calcium: 8 mg/dL — ABNORMAL LOW (ref 8.9–10.3)
Chloride: 106 mmol/L (ref 98–111)
Creatinine, Ser: 0.56 mg/dL (ref 0.44–1.00)
GFR calc Af Amer: >60 mL/min (ref >60)
GFR calc non Af Amer: >60 mL/min (ref >60)
Glucose, Bld: 80 mg/dL (ref 70–99)
Potassium: 3.2 mmol/L — ABNORMAL LOW (ref 3.5–5.1)
Sodium: 136 mmol/L (ref 135–145)

## 2018-09-11 LAB — CBC
HCT: 33.3 % — ABNORMAL LOW (ref 36.0–46.0)
Hemoglobin: 10.4 g/dL — ABNORMAL LOW (ref 12.0–15.0)
MCH: 30 pg (ref 26.0–34.0)
MCHC: 31.2 g/dL (ref 30.0–36.0)
MCV: 96 fL (ref 80.0–100.0)
Platelets: 432 10*3/uL — ABNORMAL HIGH (ref 150–400)
RBC: 3.47 MIL/uL — ABNORMAL LOW (ref 3.87–5.11)
RDW: 14 % (ref 11.5–15.5)
WBC: 9.9 10*3/uL (ref 4.0–10.5)
nRBC: 0 % (ref 0.0–0.2)

## 2018-09-11 LAB — PROCALCITONIN: Procalcitonin: <0.1 ng/mL

## 2018-09-11 LAB — CULTURE, RESPIRATORY W GRAM STAIN: Culture: NORMAL

## 2018-09-11 LAB — MAGNESIUM: Magnesium: 1.8 mg/dL (ref 1.7–2.4)

## 2018-09-11 MED ORDER — DICYCLOMINE HCL 20 MG PO TABS
20.0000 mg | ORAL_TABLET | Freq: Two times a day (BID) | ORAL | Status: DC
  Administered 2018-09-11 – 2018-09-14 (×7): 20 mg via ORAL
  Filled 2018-09-11 (×8): qty 1

## 2018-09-11 MED ORDER — GABAPENTIN 300 MG PO CAPS
300.0000 mg | ORAL_CAPSULE | Freq: Two times a day (BID) | ORAL | Status: DC | PRN
  Administered 2018-09-11 – 2018-09-13 (×3): 300 mg via ORAL
  Filled 2018-09-11 (×3): qty 1

## 2018-09-11 MED ORDER — NICOTINE 21 MG/24HR TD PT24
21.0000 mg | MEDICATED_PATCH | Freq: Every day | TRANSDERMAL | Status: DC
  Administered 2018-09-11 – 2018-09-14 (×4): 21 mg via TRANSDERMAL
  Filled 2018-09-11 (×4): qty 1

## 2018-09-11 MED ORDER — NICOTINE POLACRILEX 2 MG MT GUM
2.0000 mg | CHEWING_GUM | OROMUCOSAL | Status: DC | PRN
  Filled 2018-09-11: qty 1

## 2018-09-11 MED ORDER — POTASSIUM CHLORIDE CRYS ER 20 MEQ PO TBCR
40.0000 meq | EXTENDED_RELEASE_TABLET | ORAL | Status: AC
  Administered 2018-09-11 (×2): 40 meq via ORAL
  Filled 2018-09-11 (×2): qty 2

## 2018-09-11 MED ORDER — CHLORHEXIDINE GLUCONATE CLOTH 2 % EX PADS
6.0000 | MEDICATED_PAD | Freq: Every day | CUTANEOUS | Status: DC
  Administered 2018-09-11 – 2018-09-13 (×3): 6 via TOPICAL

## 2018-09-11 MED ORDER — GUAIFENESIN ER 600 MG PO TB12
600.0000 mg | ORAL_TABLET | Freq: Two times a day (BID) | ORAL | Status: DC
  Administered 2018-09-11 – 2018-09-14 (×7): 600 mg via ORAL
  Filled 2018-09-11 (×7): qty 1

## 2018-09-11 MED ORDER — GABAPENTIN 300 MG PO CAPS: 300.0000 mg | ORAL_CAPSULE | Freq: Two times a day (BID) | ORAL | Status: DC

## 2018-09-11 NOTE — Progress Notes (Addendum)
HEMATOLOGY-ONCOLOGY PROGRESS NOTE  SUBJECTIVE: Reports that breathing is better this morning.  Now down to 5 L of oxygen by nasal cannula.  She reports that she desats when she gets up and moves.  Still has a cough but is not productive.  States that she feels as though she can start to cough up mucus but it is stuck in her chest.  Remains afebrile.  Other vital signs are stable.  She has no other complaints this morning.  Oncology History Overview Note  Cancer Staging Metastatic non-small cell lung cancer [facility]) Staging form: Lung, AJCC 8th Edition - Clinical stage from 10/17/2017: Stage IV (cT3, cN1, cM1b) - Signed by Truitt Merle, MD on 10/17/2017     Metastatic non-small cell lung cancer ([place])  09/20/2017 Imaging   US Breast Left 09/20/17  IMPRESSION Indeterminte palpable mass measuring 3.2x1.7x2.8 cm  inferior medial to the inframammary fold of the left breast.     09/20/2017 Initial Biopsy   Diagnosis 09/20/17 Soft Tissue Needle Core Biopsy, inferior medial to IMF - ADENOCARCINOMA. - SEE MICROSCOPIC DESCRIPTION.  Microscopic Comment Immunohistochemistry will be performed and reported as an addendum. (JDP:ah 09/23/17) ADDENDUM: Immunohistochemistry shows the tumor is strongly positive with cytokeratin AE1/AE3, cytokeratin 7 and shows moderate weak positivity with CDX-2. The tumor is negative with estrogen receptor, progesterone receptor, GCDFP, GATA-3, Napsin A, TTF-1, WT-1 and cytokeratin 20. The immunophenotype is consistent with metastatic carcinoma. The combination of CDX-2 and cytokeratin 7 positivity raises the possibility of an upper gastrointestinal primary. Clinical correlation is essential.   10/06/2017 Initial Diagnosis   Metastatic adenocarcinoma involving soft tissue with unknown primary site [facility])   10/10/2017 Procedure   Upper Endoscopy by Dr. Silverio Decamp 10/10/17  IMPRESSION - Normal esophagus. - Z-line regular, 35 cm from the incisors. - Gastric bypass with a normal-sized  pouch and intact staple line. Gastrojejunal anastomosis characterized by healthy appearing mucosa. - No specimens collected.   10/12/2017 Imaging   CT CAP WO Contrast 10/12/17  IMPRESSION: 7 cm central right lung mass involving the right hilum, with postobstructive collapse of the right middle lobe, highly suspicious for primary bronchogenic carcinoma. 5 cm masslike opacity in the superior segment of the right lower lobe may represent carcinoma or postobstructive pneumonitis. 3.3 cm soft tissue mass in the lower anterior chest wall soft tissues, suspicious for metastatic disease. No evidence of abdominal or pelvic metastatic disease.    10/14/2017 Imaging   MRI Brain 10/14/17  IMPRESSION: 1. No metastatic disease or acute intracranial abnormality. Normal MRI appearance of the brain. 2. 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     10/17/2017 Cancer Staging   Staging form: Lung, AJCC 8th Edition - Clinical stage from 10/17/2017: Stage IV (cT3, cN1, cM1b) - Signed by Truitt Merle, MD on 10/17/2017   10/24/2017 - 11/04/2017 Radiation Therapy    The Right lung mass was treated to 30 Gy in 10 fractions of 3 Gy   11/08/2017 -  Chemotherapy   -first line chemo with carboplatin, paclitaxel, Atezolizumab and bevacizumab every 3weeks starting 11/08/17.  -Switched to maintenance therapy with avastin and atezolizumab q3weeks on 03/24/18   01/10/2018 Imaging   01/10/2018 CT CA IMPRESSION: 1. Response to therapy of central right lower lobe lung mass. Re-expansion of the right middle lobe with significantly improved right lower lobe postobstructive pneumonitis. Residual geographic airspace and ground-glass opacity within the medial right lung, primarily favored to be radiation induced. 2. Near complete resolution of presternal soft tissue mass. 3. New right adrenal nodule  since 10/11/2017, suspicious for metastatic disease. 4. Right-sided Port-A-Cath with probable nonocclusive thrombus  along the catheter within the right brachiocephalic vein. 5. Age advanced coronary artery atherosclerosis. Recommend assessment of coronary risk factors and consideration of medical therapy. 6. Aortic atherosclerosis (ICD10-I70.0) and emphysema (ICD10-J43.9).   03/10/2018 Imaging   CT CAP W Contrast 03/10/18 IMPRESSION: 1. Radiation changes involving the right paramediastinal lung and right hilum but no findings suspicious for residual or recurrent tumor. 2. No evidence of pulmonary metastatic disease. Stable emphysematous changes and areas of pulmonary scarring. 3. Stable 11 mm right adrenal gland nodule. 4. No findings for metastatic disease involving the liver or bony structures.   03/24/2018 - 04/24/2018 Chemotherapy   The patient had bevacizumab (AVASTIN) 1,300 mg in sodium chloride 0.9 % 100 mL chemo infusion, 15.5 mg/kg = 1,275 mg, Intravenous,  Once, 1 of 4 cycles Administration: 1,300 mg (03/24/2018) atezolizumab (TECENTRIQ) 1,200 mg in sodium chloride 0.9 % 250 mL chemo infusion, 1,200 mg, Intravenous, Once, 1 of 4 cycles Administration: 1,200 mg (03/24/2018)  for chemotherapy treatment.    04/11/2018 Imaging   CT ANGIO CHEST PE W OR WO CONTRAST  IMPRESSION: 1. No evidence of acute pulmonary embolism. 2. Progressive radiation changes in the right perihilar region. Cavitary retro hilar mass and right paratracheal lymph node have enlarged, suspicious for local recurrence/disease progression. 3. Enlarging right adrenal nodule suspicious for metastatic disease. 4. New right pleural effusion with increased atelectasis at both lung bases. 5. These results will be called to the ordering clinician or representative by the Radiology Department at the imaging location.    04/21/2018 Imaging   NUCLEAR MEDICINE PET SKULL BASE TO THIGH IMPRESSION: 1. Marked hypermetabolism in the amorphous soft tissue involving the right hilum. This represents the central component of the  apparent radiation scarring. 2. Hypermetabolic mediastinal nodal metastases with nodal metastases identified in the anterior right juxta diaphragmatic fat. 3. Hypermetabolic right adrenal metastasis. 4. Interval progression of loculated right pleural effusion.    05/02/2018 -  Chemotherapy   Second line chemo Alimta 500mg /m2 very 3 weeks    07/21/2018 Imaging   CT CAP 07/21/18  IMPRESSION: 1. Enlarging right adrenal metastatic lesion a mildly enlarging mediastinal adenopathy compatible with mild progression of disease. No new metastatic foci are identified. 2. Evolutionary findings along the right perihilar/paramediastinal radiation fibrosis including some improvement in aeration in the right upper lobe, but enlargement of the cavitary/centrally necrotic region in the superior segment right lower lobe, which is gas-filled and which probably connects to the otherwise focally occluded bronchus intermedius. 3. Other imaging findings of potential clinical significance: Probable left anterior descending coronary atherosclerosis. Mild to moderate intrahepatic biliary dilatation mild extrahepatic biliary dilatation, much of which may be a physiologic response to cholecystectomy. Prominent stool throughout the colon favors constipation. Aortic Atherosclerosis (ICD10-I70.0).   07/26/2018 -  Chemotherapy   The patient had pegfilgrastim-cbqv (UDENYCA) injection 6 mg, 6 mg, Subcutaneous, Once, 0 of 4 cycles DOCEtaxel (TAXOTERE) 140 mg in sodium chloride 0.9 % 250 mL chemo infusion, 75 mg/m2 = 140 mg, Intravenous,  Once, 2 of 6 cycles Administration: 140 mg (07/26/2018), 140 mg (08/17/2018) ramucirumab (CYRAMZA) 800 mg in sodium chloride 0.9 % 170 mL chemo infusion, 10 mg/kg = 800 mg, Intravenous, Once, 2 of 6 cycles Administration: 800 mg (07/26/2018), 800 mg (08/17/2018)  for chemotherapy treatment.       REVIEW OF SYSTEMS:   Constitutional: Denies fevers, chills Eyes: Denies blurriness of  vision Ears, nose, mouth, throat, and face:  Denies mucositis or sore throat Respiratory: Shortness of breath is improving.  O2 at 5 L.  Reports nonproductive cough but feels as though she can cough up mucus if she can get up. Cardiovascular: Denies palpitation, chest discomfort Gastrointestinal: Denies abdominal pain, nausea, vomiting.  No diarrhea or constipation. Skin: Denies abnormal skin rashes Lymphatics: Denies new lymphadenopathy or easy bruising Neurological:Denies numbness, tingling or new weaknesses Behavioral/Psych: Mood is stable, no new changes  Extremities: No lower extremity edema All other systems were reviewed with the patient and are negative.  I have reviewed the past medical history, past surgical history, social history and family history with the patient and they are unchanged from previous note.   PHYSICAL EXAMINATION: ECOG PERFORMANCE STATUS: 2 - Symptomatic, <50% confined to bed  Vitals:   09/11/18 0734 09/11/18 0800  BP:    Pulse:    Resp:    Temp:  97.9 F (36.6 C)  SpO2: 99%    Filed Weights   09/07/18 2039  Weight: 178 lb 12.7 oz (81.1 kg)    Intake/Output from previous day: 06/21 0701 - 06/22 0700 In: 148.9 [IV Piggyback:148.9] Out: 2000 [Urine:2000]  GENERAL:alert, no distress and comfortable SKIN: skin color, texture, turgor are normal, no rashes or significant lesions EYES: normal, Conjunctiva are pink and non-injected, sclera clear OROPHARYNX:no exudate, no erythema and lips, buccal mucosa, and tongue normal  NECK: supple, thyroid normal size, non-tender, without nodularity LYMPH:  no palpable lymphadenopathy in the cervical, axillary or inguinal LUNGS: Diffuse expiratory wheezes HEART: regular rate & rhythm and no murmurs and no lower extremity edema ABDOMEN:abdomen soft, non-tender and normal bowel sounds Musculoskeletal:no cyanosis of digits and no clubbing  NEURO: alert & oriented x 3 with fluent speech, no focal motor/sensory  deficits  LABORATORY DATA:  I have reviewed the data as listed CMP Latest Ref Rng & Units 09/11/2018 09/10/2018 09/09/2018  Glucose 70 - 99 mg/dL 80 118(H) 118(H)  BUN 6 - 20 mg/dL 6 <5(L) 6  Creatinine 0.44 - 1.00 mg/dL 0.56 0.41(L) 0.41(L)  Sodium 135 - 145 mmol/L 136 138 139  Potassium 3.5 - 5.1 mmol/L 3.2(L) 3.6 5.0  Chloride 98 - 111 mmol/L 106 101 102  CO2 22 - 32 mmol/L 19(L) 28 28  Calcium 8.9 - 10.3 mg/dL 8.0(L) 8.4(L) 8.6(L)  Total Protein 6.5 - 8.1 g/dL - 6.0(L) 6.4(L)  Total Bilirubin 0.3 - 1.2 mg/dL - 0.2(L) 0.3  Alkaline Phos 38 - 126 U/L - 180(H) 211(H)  AST 15 - 41 U/L - 15 20  ALT 0 - 44 U/L - 19 23    Lab Results  Component Value Date   WBC 12.7 (H) 09/10/2018   HGB 10.5 (L) 09/10/2018   HCT 34.9 (L) 09/10/2018   MCV 95.4 09/10/2018   PLT 431 (H) 09/10/2018   NEUTROABS 10.6 (H) 09/08/2018    Dg Chest 2 View  Result Date: 09/07/2018 CLINICAL DATA:  Shortness of breath EXAM: CHEST - 2 VIEW COMPARISON:  08/29/2018, CT 07/21/2018 FINDINGS: Right-sided central venous port tip over the cavoatrial region. Right parahilar architectural distortion with cavitary lesion. Slight increased ground-glass opacity at the periphery. New ground-glass opacity within the lingula and left lung base. Stable heart size. No pneumothorax. IMPRESSION: 1. New ground-glass opacity within the lingula, left base and right upper lobe, suspect for multifocal infection, to include possible atypical or viral pneumonia. 2. Right hilar/perihilar architectural distortion with cavitary lesion as noted on previous CT examinations. Electronically Signed   By: Madie Reno.D.  On: 09/07/2018 16:02   Dg Chest 2 View  Result Date: 08/30/2018 CLINICAL DATA:  Shortness of breath, nonproductive cough. Lung cancer. EXAM: CHEST - 2 VIEW COMPARISON:  CT chest 07/21/2018 and chest radiograph 04/30/2018. FINDINGS: Trachea is midline. Heart size normal. Right IJ power port terminates in the low SVC or SVC RA  junction. Post treatment parenchymal retraction in the right perihilar region with a focal area of lucency, better evaluated on 07/21/2018. Associated volume loss in the right hemithorax. Biapical pleuroparenchymal scarring, left greater than right. Lungs are otherwise clear. No pleural fluid. IMPRESSION: 1. No acute findings. 2. Post treatment changes in the right hemithorax, better evaluated on 07/21/2018. Electronically Signed   By: Lorin Picket M.D.   On: 08/30/2018 08:36   Ct Angio Chest Pe W/cm &/or Wo Cm  Result Date: 09/07/2018 CLINICAL DATA:  Lung cancer.  Shortness of breath. EXAM: CT ANGIOGRAPHY CHEST WITH CONTRAST TECHNIQUE: Multidetector CT imaging of the chest was performed using the standard protocol during bolus administration of intravenous contrast. Multiplanar CT image reconstructions and MIPs were obtained to evaluate the vascular anatomy. CONTRAST:  173mL OMNIPAQUE IOHEXOL 350 MG/ML SOLN COMPARISON:  CT dated 04/28/2018. CT from Jul 21, 2018 could not be retrieved for comparison. FINDINGS: Cardiovascular: There is a well-positioned right-sided Port-A-Cath. Evaluation for pulmonary emboli is severely limited by motion artifact and suboptimal contrast bolus timing. The main pulmonary artery as well as the left and right pulmonary arteries are dilated. The heart size is normal. There is no significant pericardial effusion. Coronary artery calcifications are noted. Mediastinum/Nodes: Again noted are pathologically enlarged mediastinal lymph nodes. There is a right superior paratracheal lymph node measuring approximately 1.6 cm (previously measuring 1.3 cm. There is an interval increase in size of multiple left AP window lymph nodes when compared to CT from February 2020. There are pathologically enlarged right hilar lymph nodes. Amorphous soft tissues again noted in the right perihilar region. There are no pathologically enlarged axillary lymph nodes. No pathologically enlarged supraclavicular  lymph nodes. Lungs/Pleura: There are ground-glass airspace opacities involving the left upper, left lower, and right upper lobes. There is a 4.2 cm cavitary air-filled structure in the right lower lobe. There is improved aeration within the right middle lobe when compared to CT from April 23, 2018. There is a small right-sided pleural effusion. Upper Abdomen: Again noted is a right adrenal mass measuring approximately 3.1 x 3 cm. The left adrenal gland is only partially visualized and is unremarkable. There is mild intrahepatic biliary ductal dilatation which appears stable from prior studies. There are surgical clips near the GE junction. 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 Musculoskeletal: There is a growing soft tissue nodule in the anterior midline chest measuring approximately 1.5 cm (axial series 4, image 73). There is no acute displaced fracture. Review of the MIP images confirms the above findings. IMPRESSION: 1. Detection of pulmonary emboli is severely limited by motion artifact and suboptimal contrast bolus timing. Given this limitation, no PE was identified. The pulmonary arteries are dilated which can be seen in patients with elevated pulmonary artery pressures. 2. Diffuse ground-glass airspace opacities involving the right upper lobe, left upper lobe, and left lower lobe. Findings are concerning for multifocal pneumonia (viral or bacterial). 3. Persistent ill-defined soft tissue in the right perihilar region with multiple pathologically enlarged mediastinal and hilar lymph nodes. There is a growing air-filled cavitary area within the right perihilar region which may be related to post treatment changes. 4.  Small right-sided pleural effusion. 5. Growing subcutaneous soft tissue nodule in the anterior midline left chest wall as detailed above. This could represent a metastatic deposit. Again noted is a right adrenal metastatic lesion as detailed above. Aortic Atherosclerosis  (ICD10-I70.0). Electronically Signed   By: Constance Holster M.D.   On: 09/07/2018 17:08    ASSESSMENT AND PLAN: 1.  Hypoxia, respiratory distress 2.  Community-acquired pneumonia 3.  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  COPD 5.  Mild hypokalemia 6.  Anxiety/depression 7.  Leukocytosis secondary to infection 8.  Anemia secondary to recent chemotherapy 9.  Goals of care: Full code  -Respiratory status is improving.  Remains on 5 L of oxygen.  Will try her on Mucinex. -Antibiotics per hospitalist for pneumonia -The patient completed her second cycle of Taxotere and Cyramza on 08/17/2018.  Therapy held on 09/07/1118 secondary to hypoxia and respiratory distress.  Will resume treatment for her cancer as an outpatient once she has recovered from her acute illness.  Low likelihood for pneumonitis from this particular chemotherapy regimen. -Continue steroids.  Wean as respiratory status improves. -Replete potassium per hospitalist -Continue fluoxetine.  Continue low-dose Xanax. -Monitor daily CBC.  Transfuse packed red blood cells if her hemoglobin is less than 7.0 or active bleeding.  CBC is pending today. -Goals of care: she understands her lung cancer is terminal, she has had 3 lines of chemotherapy, overall prognosis is very.  She is hoping her pneumonia is treatable, and she wants to be intubated if needed for her pneumonia treatment, but agrees not to be on life support for long time.     LOS: 4 days   Mikey Bussing, DNP, AGPCNP-BC, AOCNP 09/11/18    Addendum   I have seen the patient, examined her. I agree with the assessment and and plan and have edited the notes.   Makaiya has overall improved some over the weekend,oxygen requirement has decreased, was able to take a shower this afternoon.  He remains to be hemodynamically stable, no other new complaints.  Patient understands she will likely need home oxygen  on discharge. she wants to continue chemo if she is able to recover well from his infection episode, but she understands that she may not be able to do chemo again if she does not recover well enough. She asked how much time she still has, which is difficult to predict, but I told her likely a few to several months. She is realistic and has agreed DNR if her deterioration is related to cancer.. She is concerned about her mom, she has been trying to discuss her end of life issues with her sister and mother, but they are not open to that. Per her request, I called her mother, she is not ready to hospice, and wants to take her home on discharge.   I agree to continue antibiotics and steroids, I think she may need steroids for 2-4 weeks, depends on her recovery. I appreciate the excellent care from the hospitalist team. I will continue f/u.   Truitt Merle  09/11/2018

## 2018-09-11 NOTE — Progress Notes (Signed)
**Note De-Identified vi Obfusction** PROGRESS NOTE    Yvonne Lang  ZOX:[id number] DOB: [birth year] DOA: 09/07/2018 PCP: Redmond School, MD   Brief Nrrtive: Yvonne Lang is  53 y.o. femle with  known history of COPD, depression/nxiety, GERD, irritble bowel syndrome, lung cncer presents to the emergency deprtment for evlution of shortness of breth.  Ptient ws in  usul stte of helth until this fternoon when she ws t her oncologist office for follow-up ppointment nd chemotherpy when she ws noted to hve 1 week history of incresed shortness of breth, tchycrdi, tchypne nd hypoxi.  Her O2 st ws round 91%.  She ws recently treted with prednisone for  COPD excerbtion.  Her symptoms re refrctory to lbuterol tretments t home.  She did not end up receiving her chemotherpy tody  Ptient denies fevers/chills, wekness, dizziness, chest pin, N/V/C/D, bdominl pin, dysuri/frequency, chnges in mentl sttus.    Otherwise there hs been no chnge in sttus. Ptient hs been tking mediction s prescribed nd there hs been no recent chnge in mediction or diet.  No recent ntibiotics.  There hs been no recent illness, hospitliztions, trvel or sick contcts.    Assessment & Pln:   Active Problems:   CAP (community cquired pneumoni)   #. Community Acquired Pneumoni  Acute Hypoxic Respirtory Filure  Diffuse Ground Glss Airspce Opcities - Immunocompromised, on chemo - recent course of steroids nd bctrim for COPD excerbtion - ddx includes virl or bcteri pneumoni vs pneumonitis relted to chemo (per oncology, pneumonitis rre with her chemo regimen) - continued high O2 needs - Covid19 negtive x 2.  No fevers since dmission.  No known covid contcts.  Will d/c precutions t this time. - nrrow bx to zosyn (6/18 - present)  - blood (NGTD), norml resp flor  - flu, RVP negtive - negtive urine strep - urine legionell is negtive - elevted esr/crp -  proclcitonin pending, wnl  - some wheezing nd prior improvement on steroids, will continue for now, will need wen eventully  - prn nd scheduled nebs - Follow-up incidentl findings on CTA of the chest s bove  #. Mild hypoklemi  - replce, follow  #.  History of depression nd nxiety -Continue Prozc -Hold Ativn nd Ambien - cution with prn xnx  #. History of GERD - Continue Protonix or Prilosec  # Hedche: improved with HA cocktil yesterdy, 6/20  DVT prophylxis: lovenox Code Sttus: full  Fmily Communiction: declined me clling, sys she'll updte 6/19 Disposition Pln: requires inptient tretment with continued hrf nd O2 requirements   Consultnts:   none  Procedures:  none  Antimicrobils: Anti-infectives (From dmission, onwrd)   Strt     Dose/Rte Route Frequency Ordered Stop   09/08/18 1200  vncomycin (VANCOCIN) IVPB 1000 mg/200 mL premix  Sttus:  Discontinued     1,000 mg 200 mL/hr over 60 Minutes Intrvenous Every 12 hours 09/07/18 2318 09/08/18 0902   09/08/18 0000  vncomycin (VANCOCIN) 1,500 mg in sodium chloride 0.9 % 500 mL IVPB     1,500 mg 250 mL/hr over 120 Minutes Intrvenous  Once 09/07/18 2310 09/08/18 0219   09/08/18 0000  pipercillin-tzobctm (ZOSYN) IVPB 3.375 g     3.375 g 12.5 mL/hr over 240 Minutes Intrvenous Every 8 hours 09/07/18 2317     09/07/18 1745  cefTRIAXone (ROCEPHIN) 2 g in sodium chloride 0.9 % 100 mL IVPB  Sttus:  Discontinued     2 g 200 mL/hr over 30 Minutes Intrvenous Every 24 hours 09/07/18 1741 09/07/18 2259 **Note De-Identified vi Obfusction** 09/07/18 1745  zithromycin (ZITHROMAX) 500 mg in sodium chloride 0.9 % 250 mL IVPB  Sttus:  Discontinued     500 mg 250 mL/hr over 60 Minutes Intrvenous Every 24 hours 09/07/18 1741 09/07/18 2259     Subjective: Feeling grdully better  Objective: Vitls:   09/11/18 0600 09/11/18 0734 09/11/18 0800 09/11/18 1200  BP: 121/74  114/75 (!) 151/88  Pulse: 76  87 94  Resp: (!)  21  (!) 23 19  Temp:   97.9 F (36.6 C) 97.9 F (36.6 C)  TempSrc:   Orl Orl  SpO2: 100% 99% 96% 92%  Weight:      Height:        Intke/Output Summry (Lst 24 hours) t 09/11/2018 1222 Lst dt filed t 09/11/2018 0600 Gross per 24 hour  Intke 148.89 ml  Output 2000 ml  Net -1851.11 ml   Filed Weights   09/07/18 2039  Weight: 81.1 kg    Exmintion:  Generl: No cute distress. Crdiovsculr: Hert sounds show  regulr rte, nd rhythm.  Lungs: mild diffuse wheezing, improved wob Abdomen: Soft, nontender, nondistended  Neurologicl: Alert nd oriented 3. Moves ll extremities 4. Crnil nerves II through XII grossly intct. Skin: Wrm nd dry. No rshes or lesions. Extremities: No clubbing or cynosis. No edem. Psychitric: Mood nd ffect re norml. Insight nd judgment re pproprite.    Dt Reviewed: I hve personlly reviewed following lbs nd imging studies  CBC: Recent Lbs  Lb 09/07/18 1250 09/07/18 2106 09/08/18 0607 09/09/18 0449 09/10/18 0327 09/11/18 0930  WBC 28.9* 21.1* 13.8* 11.8* 12.7* 9.9  NEUTROABS 25.3*  --  10.6*  --   --   --   HGB 11.7* 11.1* 10.4* 10.4* 10.5* 10.4*  HCT 36.9 35.6* 32.9* 34.7* 34.9* 33.3*  MCV 93.4 95.2 95.9 96.1 95.4 96.0  PLT 393 362 356 387 431* 250*   Bsic Metbolic Pnel: Recent Lbs  Lb 09/07/18 1250 09/07/18 2106 09/08/18 0607 09/09/18 0449 09/10/18 0327 09/11/18 0735  NA 136  --  137 139 138 136  K 3.0*  --  3.4* 5.0 3.6 3.2*  CL 101  --  103 102 101 106  CO2 23  --  '23 28 28 ' 19*  GLUCOSE 103*  --  101* 118* 118* 80  BUN 6  --  5* 6 <5* 6  CREATININE 0.65 0.52 0.45 0.41* 0.41* 0.56  CALCIUM 8.6*  --  8.0* 8.6* 8.4* 8.0*  MG  --  2.1 2.0 2.4 1.9 1.8  PHOS  --  2.9  --   --   --   --    GFR: Estimted Cretinine Clernce: 89.1 mL/min (by [employer] formul bsed on SCr of 0.56 mg/dL). Liver Function Tests: Recent Lbs  Lb 09/07/18 1250 09/09/18 0449 09/10/18 0327  AST '30 20 15   ' ALT 38 23 19  ALKPHOS 282* 211* 180*  BILITOT 0.3 0.3 0.2*  PROT 6.6 6.4* 6.0*  ALBUMIN 2.8* 2.5* 2.5*   No results for input(s): LIPASE, AMYLASE in the lst 168 hours. No results for input(s): AMMONIA in the lst 168 hours. Cogultion Profile: Recent Lbs  Lb 09/07/18 1555  INR 0.9   Crdic Enzymes: No results for input(s): CKTOTAL, CKMB, CKMBINDEX, TROPONINI in the lst 168 hours. BNP (lst 3 results) No results for input(s): PROBNP in the lst 8760 hours. HbA1C: No results for input(s): HGBA1C in the lst 72 hours. CBG: No results for input(s): GLUCAP in the lst 168 hours. Lipid Profile: No  results for input(s): CHOL, HDL, LDLCLC, TRIG, CHOLHDL, LDLDIRECT in the last 72 hours. Thyroid Function Tests: No results for input(s): TSH, T4TOTL, FREET4, T3FREE, THYROIDB in the last 72 hours. nemia Panel: No results for input(s): VITMINB12, FOLTE, FERRITIN, TIBC, IRON, RETICCTPCT in the last 72 hours. Sepsis Labs: Recent Labs  Lab 09/07/18 1813 09/09/18 0449 09/10/18 0327 09/11/18 0228 09/11/18 0550  PROCLCITON  --  <0.10 <0.10 QUESTIONBLE RESULTS, RECOMMEND RECOLLECT TO VERIFY <0.10  LTICCIDVEN 2.3*  --   --   --   --     Recent Results (from the past 240 hour(s))  SRS Coronavirus 2 (CEPHEID - Performed in [facility] hospital lab), Hosp Order     Status: None   Collection Time: 09/07/18  3:56 PM   Specimen: Nasopharyngeal Swab  Result Value Ref Range Status   SRS Coronavirus 2 NEGTIVE NEGTIVE Final    Comment: (NOTE) If result is NEGTIVE SRS-CoV-2 target nucleic acids are NOT DETECTED. The SRS-CoV-2 RN is generally detectable in upper and lower  respiratory specimens during the acute phase of infection. The lowest  concentration of SRS-CoV-2 viral copies this assay can detect is 250  copies / mL.  negative result does not preclude SRS-CoV-2 infection  and should not be used as the sole basis for treatment or other  patient management  decisions.   negative result may occur with  improper specimen collection / handling, submission of specimen other  than nasopharyngeal swab, presence of viral mutation(s) within the  areas targeted by this assay, and inadequate number of viral copies  (<250 copies / mL).  negative result must be combined with clinical  observations, patient history, and epidemiological information. If result is POSITIVE SRS-CoV-2 target nucleic acids are DETECTED. The SRS-CoV-2 RN is generally detectable in upper and lower  respiratory specimens dur ing the acute phase of infection.  Positive  results are indicative of active infection with SRS-CoV-2.  Clinical  correlation with patient history and other diagnostic information is  necessary to determine patient infection status.  Positive results do  not rule out bacterial infection or co-infection with other viruses. If result is PRESUMPTIVE POSTIVE SRS-CoV-2 nucleic acids MY BE PRESENT.    presumptive positive result was obtained on the submitted specimen  and confirmed on repeat testing.  While 2019 novel coronavirus  (SRS-CoV-2) nucleic acids may be present in the submitted sample  additional confirmatory testing may be necessary for epidemiological  and / or clinical management purposes  to differentiate between  SRS-CoV-2 and other Sarbecovirus currently known to infect humans.  If clinically indicated additional testing with an alternate test  methodology [phone]) is advised. The SRS-CoV-2 RN is generally  detectable in upper and lower respiratory sp ecimens during the acute  phase of infection. The expected result is Negative. Fact Sheet for Patients:  StrictlyIdeas.no Fact Sheet for Healthcare Providers: BankingDealers.co.za This test is not yet approved or cleared by the [place] FD and has been authorized for detection and/or diagnosis of SRS-CoV-2 by FD under an  Emergency Use uthorization (EU).  This EU will remain in effect (meaning this test can be used) for the duration of the COVID-19 declaration under Section 564(b)(1) of the ct, 21 U.S.C. section 360bbb-3(b)(1), unless the authorization is terminated or revoked sooner. Performed at [facility], [place] [address] [place], [place] [address]   Culture, blood (Routine X 2) w Reflex to ID Panel     Status: None (Preliminary result)   Collection Time: 09/07/18 **Note De-Identified vi Obfusction** 5:42 PM   Specimen: BLOOD  Result Vlue Ref Rnge Sttus   Specimen Description   Finl    BLOOD RIGHT NTECUBITL Performed t [place] [address], [place], [place] [address]    Specil Requests   Finl    BOTTLES DRWN EROBIC ND NEROBIC Blood Culture dequte volume Performed t [place] [address] [place], [place] [address]    Culture   Finl    NO GROWTH 4 DYS Performed t [facility], [place] [address] [place], [place] [address]    Report Sttus PENDING  Incomplete  Culture, blood (Routine X 2) w Reflex to ID Pnel     Sttus: None (Preliminry result)   Collection Time: 09/07/18  5:55 PM   Specimen: BLOOD  Result Vlue Ref Rnge Sttus   Specimen Description   Finl    BLOOD RIGHT PORT CTH CHEST Performed t [place] [address] [place], [place] [address]    Specil Requests   Finl    BOTTLES DRWN EROBIC ND NEROBIC Blood Culture dequte volume Performed t [place] [address] [place], [place] [address]    Culture   Finl    NO GROWTH 4 DYS Performed t [facility], [place] [address] [place], [place] [address]    Report Sttus PENDING  Incomplete  Respirtory Pnel by PCR     Sttus: None   Collection Time: 09/07/18 11:01 PM   Specimen: Nsophryngel Swb; Respirtory  Result Vlue Ref Rnge Sttus   denovirus NOT DETECTED NOT DETECTED Finl   Coronvirus  229E NOT DETECTED NOT DETECTED Finl    Comment: (NOTE) The Coronvirus on the Respirtory Pnel, DOES NOT test for the novel  Coronvirus (2019 nCoV)    Coronvirus HKU1 NOT DETECTED NOT DETECTED Finl   Coronvirus NL63 NOT DETECTED NOT DETECTED Finl   Coronvirus OC43 NOT DETECTED NOT DETECTED Finl   Metpneumovirus NOT DETECTED NOT DETECTED Finl   Rhinovirus / Enterovirus NOT DETECTED NOT DETECTED Finl   Influenz  NOT DETECTED NOT DETECTED Finl   Influenz B NOT DETECTED NOT DETECTED Finl   Prinfluenz Virus 1 NOT DETECTED NOT DETECTED Finl   Prinfluenz Virus 2 NOT DETECTED NOT DETECTED Finl   Prinfluenz Virus 3 NOT DETECTED NOT DETECTED Finl   Prinfluenz Virus 4 NOT DETECTED NOT DETECTED Finl   Respirtory Syncytil Virus NOT DETECTED NOT DETECTED Finl   Bordetell pertussis NOT DETECTED NOT DETECTED Finl   Chlmydophil pneumonie NOT DETECTED NOT DETECTED Finl   Mycoplsm pneumonie NOT DETECTED NOT DETECTED Finl    Comment: Performed t [facility], [address], [place], [place] [address]  MRS PCR Screening     Sttus: None   Collection Time: 09/08/18 12:33 M   Specimen: Nsl Mucos; Nsophryngel  Result Vlue Ref Rnge Sttus   MRS by PCR NEGTIVE NEGTIVE Finl    Comment:        The GeneXpert MRS ssy (FD pproved for NSL specimens only), is one component of  comprehensive MRS coloniztion surveillnce progrm. It is not intended to dignose MRS infection nor to guide or monitor tretment for MRS infections. Performed t [facility], [place] [address] [place],  [address]   SRS Coronvirus 2 [facility] order, Performed in [facility] lb)     Sttus: None   Collection Time: 09/08/18 10:31 M   Specimen: Nsophryngel Swb  Result Vlue Ref Rnge **Note De-Identified vi Obfusction** Sttus   SRS Coronvirus 2 NEGTIVE NEGTIVE Finl    Comment: (NOTE) If result is NEGTIVE SRS-CoV-2 trget nucleic cids re NOT  DETECTED. The SRS-CoV-2 RN is generlly detectble in upper nd lower  respirtory specimens during the cute phse of infection. The lowest  concentrtion of SRS-CoV-2 virl copies this ssy cn detect is 250  copies / mL.  negtive result does not preclude SRS-CoV-2 infection  nd should not be used s the sole bsis for tretment or other  ptient mngement decisions.   negtive result my occur with  improper specimen collection / hndling, submission of specimen other  thn nsophryngel swb, presence of virl muttion(s) within the  res trgeted by this ssy, nd indequte number of virl copies  (<250 copies / mL).  negtive result must be combined with clinicl  observtions, ptient history, nd epidemiologicl informtion. If result is POSITIVE SRS-CoV-2 trget nucleic cids re DETECTED. The SRS-CoV-2 RN is generlly detectble in upper nd lower  respirtory specimens dur ing the cute phse of infection.  Positive  results re indictive of ctive infection with SRS-CoV-2.  Clinicl  correltion with ptient history nd other dignostic informtion is  necessry to determine ptient infection sttus.  Positive results do  not rule out bcteril infection or co-infection with other viruses. If result is PRESUMPTIVE POSTIVE SRS-CoV-2 nucleic cids MY BE PRESENT.    presumptive positive result ws obtined on the submitted specimen  nd confirmed on repet testing.  While 2019 novel coronvirus  (SRS-CoV-2) nucleic cids my be present in the submitted smple  dditionl confirmtory testing my be necessry for epidemiologicl  nd / or clinicl mngement purposes  to differentite between  SRS-CoV-2 nd other Srbecovirus currently known to infect humns.  If cliniclly indicted dditionl testing with n lternte test  methodology [phone]) is dvised. The SRS-CoV-2 RN is generlly  detectble in upper nd lower respirtory sp ecimens during  the cute  phse of infection. The expected result is Negtive. Fct Sheet for Ptients:  StrictlyIdes.no Fct Sheet for [facility] Providers: BnkingDelers.co.z This test is not yet pproved or clered by the [place] FD nd hs been uthorized for detection nd/or dignosis of SRS-CoV-2 by FD under n Emergency Use uthoriztion (EU).  This EU will remin in effect (mening this test cn be used) for the durtion of the COVID-19 declrtion under Section 564(b)(1) of the ct, 21 U.S.C. section 360bbb-3(b)(1), unless the uthoriztion is terminted or revoked sooner. Performed t [facility], [place] [address], [place], [place] [address]   MRS PCR Screening     Sttus: None   Collection Time: 09/08/18 11:45 M   Specimen: Nsl Mucos; Nsophryngel  Result Vlue Ref Rnge Sttus   MRS by PCR NEGTIVE NEGTIVE Finl    Comment:        The GeneXpert MRS ssy (FD pproved for NSL specimens only), is one component of  comprehensive MRS coloniztion surveillnce progrm. It is not intended to dignose MRS infection nor to guide or monitor tretment for MRS infections. Performed t [facility], [place] [address] [place], [place] [address]   Culture, sputum-ssessment     Sttus: None   Collection Time: 09/08/18  8:00 PM   Specimen: Nsophryngel Swb; Sputum  Result Vlue Ref Rnge Sttus   Specimen Description EXPECTORTED SPUTUM  Finl   Specil Requests Immunocompromised  Finl   Sputum evlution   Finl    THIS SPECIMEN IS CCEPTBLE FOR SPUTUM CULTURE Performed t [facility], [place] [address], [place], [place] [address]    Report Status 09/08/2018 FINAL  Final  Culture, respiratory     Status: None   Collection Time: 09/08/18  8:00 PM  Result Value Ref Range Status   Specimen Description   Final    EXPECTORATED SPUTUM Performed at [facility] [address], [place], [place] [address]    Special Requests   Final    Immunocompromised Reflexed from [id number] Performed at [facility], [place] [address], [place], [place] [address]    Gram Stain   Final    FEW WBC PRESENT, PREDOMINANTLY PMN NO ORGANISMS SEEN    Culture   Final    Consistent with normal respiratory flora. Performed at [facility], [place] [address], [place], [place] [address]    Report Status 09/11/2018 FINAL  Final         Radiology Studies: No results found.      Scheduled Meds: . chlorhexidine  15 mL Mouth Rinse BID  . Chlorhexidine Gluconate Cloth  6 each Topical Daily  . dicyclomine  20 mg Oral BID AC  . enoxaparin (LOVENOX) injection  40 mg Subcutaneous Q24H  . FLUoxetine  80 mg Oral Daily  . guaiFENesin  600 mg Oral BID  . ipratropium-albuterol  3 mL Nebulization TID  . mouth rinse  15 mL Mouth Rinse q12n4p  . methylPREDNISolone (SOLU-MEDROL) injection  40 mg Intravenous Q12H  . nicotine  21 mg Transdermal Daily  . nystatin  5 mL Mouth/Throat TID  . pantoprazole  40 mg Oral Daily  . sucralfate  1 g Oral TID WC & HS   Continuous Infusions: . piperacillin-tazobactam (ZOSYN)  IV 3.375 g (09/11/18 1101)     LOS: 4 days    Time spent: over 45 min    Fayrene Helper, MD [facility] Pager AMION  If 7PM-7AM, please contact night-coverage [email or web address] Password [facility] 09/11/2018, 12:22 PM

## 2018-09-12 LAB — CBC
HCT: 32.4 % — ABNORMAL LOW (ref 36.0–46.0)
Hemoglobin: 10.1 g/dL — ABNORMAL LOW (ref 12.0–15.0)
MCH: 30.2 pg (ref 26.0–34.0)
MCHC: 31.2 g/dL (ref 30.0–36.0)
MCV: 97 fL (ref 80.0–100.0)
Platelets: 436 10*3/uL — ABNORMAL HIGH (ref 150–400)
RBC: 3.34 MIL/uL — ABNORMAL LOW (ref 3.87–5.11)
RDW: 13.8 % (ref 11.5–15.5)
WBC: 9.9 10*3/uL (ref 4.0–10.5)
nRBC: 0 % (ref 0.0–0.2)

## 2018-09-12 LAB — MAGNESIUM: Magnesium: 2.2 mg/dL (ref 1.7–2.4)

## 2018-09-12 LAB — CULTURE, BLOOD (ROUTINE X 2)
Culture: NO GROWTH
Culture: NO GROWTH
Special Requests: ADEQUATE
Special Requests: ADEQUATE

## 2018-09-12 LAB — COMPREHENSIVE METABOLIC PANEL
ALT: 16 U/L (ref 0–44)
AST: 14 U/L — ABNORMAL LOW (ref 15–41)
Albumin: 2.4 g/dL — ABNORMAL LOW (ref 3.5–5.0)
Alkaline Phosphatase: 150 U/L — ABNORMAL HIGH (ref 38–126)
Anion gap: 7 (ref 5–15)
BUN: 6 mg/dL (ref 6–20)
CO2: 30 mmol/L (ref 22–32)
Calcium: 8.3 mg/dL — ABNORMAL LOW (ref 8.9–10.3)
Chloride: 102 mmol/L (ref 98–111)
Creatinine, Ser: 0.4 mg/dL — ABNORMAL LOW (ref 0.44–1.00)
GFR calc Af Amer: >60 mL/min (ref >60)
GFR calc non Af Amer: >60 mL/min (ref >60)
Glucose, Bld: 116 mg/dL — ABNORMAL HIGH (ref 70–99)
Potassium: 4.4 mmol/L (ref 3.5–5.1)
Sodium: 139 mmol/L (ref 135–145)
Total Bilirubin: <0.1 mg/dL — ABNORMAL LOW (ref 0.3–1.2)
Total Protein: 5.7 g/dL — ABNORMAL LOW (ref 6.5–8.1)

## 2018-09-12 MED ORDER — PREDNISONE 20 MG PO TABS
40.0000 mg | ORAL_TABLET | Freq: Every day | ORAL | Status: DC
  Administered 2018-09-13 – 2018-09-14 (×2): 40 mg via ORAL
  Filled 2018-09-12 (×2): qty 2

## 2018-09-12 NOTE — Progress Notes (Addendum)
Physical Therapy Treatment Patient Details Name: Yvonne Lang MRN: [id number] DOB: 1966/02/04 Today's Date: 09/12/2018    History of Present Illness 53 yo female admitted to ED on 6/18 with CAP, with acute hypoxic respiratory failure. Pt with lung cancer undergoing chemo currently. Covid test negative x2, MD still ruling it out as pt with diffuse ground glass appearance of lungs on CXR. PMH includes COPD, anxiety, bronchitis, depression, gastric bypass, IBS, PE.    PT Comments    The patient currently with HFNC ion 3 L. Ambulated x 350 on 3 L with SaO2 > 92%. HR increasing to 110's and RR 30. Placed on RA x ~50' with Sao2 down to 85 %, HR 125 and RR 44. Standing rest break and back on 3 L with SaO2 gradual return to >90%.  Recommend another saturation walk test with regular [place]. Most likely  will require supplemental oxygen.  Follow Up Recommendations  No PT follow up;Supervision for mobility/OOB     Equipment Recommendations  None recommended by PT    Recommendations for Other Services       Precautions / Restrictions Precautions Precaution Comments: on HFNC  but on 3 liters( had been on higher) monitor sats ahd HR    Mobility  Bed Mobility Overal bed mobility: Independent                Transfers Overall transfer level: Needs assistance Equipment used: None Transfers: Sit to/from Stand Sit to Stand: Supervision            Ambulation/Gait Ambulation/Gait assistance: Min guard;Min assist Gait Distance (Feet): 440 Feet Assistive device: 1 person hand held assist;None Gait Pattern/deviations: Step-through pattern;Decreased stride length;Staggering right;Staggering left Gait velocity: fdecreased   General Gait Details: patient with several balance losses, appears to be not paying full attention and looking around a lot. Patient ambulated on 3 L. except for 49' on RA with Sats dropping  to85, HR 125 and RR 44.Marland Kitchen Stopped patient and encouraged  to slow breaths and  not tal.   Stairs             Wheelchair Mobility    Modified Rankin (Stroke Patients Only)       Balance   Sitting-balance support: No upper extremity supported Sitting balance-Leahy Scale: Normal     Standing balance support: During functional activity;No upper extremity supported Standing balance-Leahy Scale: Fair Standing balance comment: staggering at times                            Cognition Arousal/Alertness: Awake/alert Behavior During Therapy: Impulsive                                   General Comments: cues to slow down speed      Exercises      General Comments        Pertinent Vitals/Pain Pain Assessment: No/denies pain    Home Living                      Prior Function            PT Goals (current goals can now be found in the care plan section) Progress towards PT goals: Progressing toward goals    Frequency    Min 3X/week      PT Plan Current plan remains appropriate    Co-evaluation  AM-PAC PT "6 Clicks" Mobility   Outcome Measure  Help needed turning from your back to your side while in a flat bed without using bedrails?: None Help needed moving from lying on your back to sitting on the side of a flat bed without using bedrails?: None Help needed moving to and from a bed to a chair (including a wheelchair)?: A Little Help needed standing up from a chair using your arms (e.g., wheelchair or bedside chair)?: A Little Help needed to walk in hospital room?: A Little Help needed climbing 3-5 steps with a railing? : A Little 6 Click Score: 20    End of Session Equipment Utilized During Treatment: Oxygen Activity Tolerance: Patient tolerated treatment well Patient left: with call bell/phone within reach;in chair Nurse Communication: Mobility status PT Visit Diagnosis: Other abnormalities of gait and mobility (R26.89)     Time: 5868-2574 PT Time Calculation (min)  (ACUTE ONLY): 31 min  Charges:  $Gait Training: 23-37 mins                     [place] Pager [phone] Office [phone]    Claretha Cooper 09/12/2018, 2:53 PM

## 2018-09-12 NOTE — Progress Notes (Addendum)
PROGRESS NOTE    Yvonne Lang  FKC:[id number] DOB: [birth year] DOA: 09/07/2018 PCP: Redmond School, MD   Brief Narrative: Yvonne Lang is Yvonne Lang 52 y.o. female with Raif Chachere known history of COPD, depression/anxiety, GERD, irritable bowel syndrome, lung cancer presents to the emergency department for evaluation of shortness of breath.  Patient was in Shadi Sessler usual state of health until this afternoon when she was at her oncologist office for follow-up appointment and chemotherapy when she was noted to have 1 week history of increased shortness of breath, tachycardia, tachypnea and hypoxia.  Her O2 sat was around 91%.  She was recently treated with prednisone for Zilda No COPD exacerbation.  Her symptoms are refractory to albuterol treatments at home.  She did not end up receiving her chemotherapy today  Patient denies fevers/chills, weakness, dizziness, chest pain, N/V/C/D, abdominal pain, dysuria/frequency, changes in mental status.    Otherwise there has been no change in status. Patient has been taking medication as prescribed and there has been no recent change in medication or diet.  No recent antibiotics.  There has been no recent illness, hospitalizations, travel or sick contacts.    She was admitted for community acquired pneumonia.  She has tested negative for COVID19 x 2.  She initially had high O2 requirements that have improved with steroids and IV antibiotics.  Now improving, transferred to med surg.     Assessment & Plan:   Active Problems:   Metastatic non-small cell lung cancer ([place])   CAP (community acquired pneumonia)   #. Community Acquired Pneumonia   Acute Hypoxic Respiratory Failure   Diffuse Ground Glass Airspace Opacities - Immunocompromised, on chemo - recent course of steroids and bactrim for COPD exacerbation - ddx includes viral or bacteria pneumonia vs pneumonitis related to chemo (per oncology, pneumonitis rare with her chemo regimen) - Improving oxygen requirement, will  transfer to med surg 6/23 - Covid19 negative x 2.  No fevers since admission.  No known covid contacts.  Precautions discontinued. - narrow abx to zosyn (6/18 - present).  Plan for 7 days zosyn.    - blood (NGTD), normal resp flora  - flu, RVP negative - negative urine strep - urine legionella is negative - elevated esr/crp - procalcitonin pending, wnl  - solumedrol - > wean to prednisone tomorrow, per oncology recommending 2-4 week steroids depending on her recovery - prn and scheduled nebs - Follow-up incidental findings on CTA of the chest as above  #. Mild hypokalemia  - replace, follow  #.  History of depression and anxiety -Continue Prozac -Hold Ativan and Ambien - caution with prn xanax  #. History of GERD - Continue Protonix or Prilosec  # Headache: improved with HA cocktail, 6/20  DVT prophylaxis: lovenox Code Status: full  Family Communication: declined me calling, says she'll update 6/19 Disposition Plan: requires inpatient treatment with continued ahrf and O2 requirements   Consultants:   none  Procedures:  none  Antimicrobials: Anti-infectives (From admission, onward)   Start     Dose/Rate Route Frequency Ordered Stop   09/08/18 1200  vancomycin (VANCOCIN) IVPB 1000 mg/200 mL premix  Status:  Discontinued     1,000 mg 200 mL/hr over 60 Minutes Intravenous Every 12 hours 09/07/18 2318 09/08/18 0902   09/08/18 0000  vancomycin (VANCOCIN) 1,500 mg in sodium chloride 0.9 % 500 mL IVPB     1,500 mg 250 mL/hr over 120 Minutes Intravenous  Once 09/07/18 2310 09/08/18 0219   09/08/18 0000  piperacillin-tazobactam (ZOSYN) IVPB 3.375  g     3.375 g 12.5 mL/hr over 240 Minutes Intravenous Every 8 hours 09/07/18 2317     09/07/18 1745  cefTRIAXone (ROCEPHIN) 2 g in sodium chloride 0.9 % 100 mL IVPB  Status:  Discontinued     2 g 200 mL/hr over 30 Minutes Intravenous Every 24 hours 09/07/18 1741 09/07/18 2259   09/07/18 1745  azithromycin (ZITHROMAX) 500 mg in  sodium chloride 0.9 % 250 mL IVPB  Status:  Discontinued     500 mg 250 mL/hr over 60 Minutes Intravenous Every 24 hours 09/07/18 1741 09/07/18 2259     Subjective: Feeling steadily better Still not back to baseline  Objective: Vitals:   09/12/18 0400 09/12/18 0600 09/12/18 0800 09/12/18 0823  BP: (!) 147/90 (!) 120/102    Pulse: 77 86    Resp: 19 (!) 26    Temp: 97.9 F (36.6 C)  97.6 F (36.4 C)   TempSrc: Oral  Oral   SpO2: 100% 96%  98%  Weight:      Height:        Intake/Output Summary (Last 24 hours) at 09/12/2018 1020 Last data filed at 09/12/2018 0414 Gross per 24 hour  Intake 149.96 ml  Output 1000 ml  Net -850.04 ml   Filed Weights   09/07/18 2039  Weight: 81.1 kg    Examination:  General: No acute distress. Cardiovascular: Heart sounds show Wesson Stith regular rate, and rhythm. Lungs: Clear to auscultation bilaterally  Abdomen: Soft, nontender, nondistended  Neurological: Alert and oriented 3. Moves all extremities 4. Cranial nerves II through XII grossly intact. Skin: Warm and dry. No rashes or lesions. Extremities: No clubbing or cyanosis. No edema.  Psychiatric: Mood and affect are normal. Insight and judgment are appropriate.   Data Reviewed: I have personally reviewed following labs and imaging studies  CBC: Recent Labs  Lab 09/07/18 1250  09/08/18 0607 09/09/18 0449 09/10/18 0327 09/11/18 0930 09/12/18 0440  WBC 28.9*   < > 13.8* 11.8* 12.7* 9.9 9.9  NEUTROABS 25.3*  --  10.6*  --   --   --   --   HGB 11.7*   < > 10.4* 10.4* 10.5* 10.4* 10.1*  HCT 36.9   < > 32.9* 34.7* 34.9* 33.3* 32.4*  MCV 93.4   < > 95.9 96.1 95.4 96.0 97.0  PLT 393   < > 356 387 431* 432* 436*   < > = values in this interval not displayed.   Basic Metabolic Panel: Recent Labs  Lab 09/07/18 2106 09/08/18 0607 09/09/18 0449 09/10/18 0327 09/11/18 0735 09/12/18 0440  NA  --  137 139 138 136 139  K  --  3.4* 5.0 3.6 3.2* 4.4  CL  --  103 102 101 106 102  CO2  --   '23 28 28 ' 19* 30  GLUCOSE  --  101* 118* 118* 80 116*  BUN  --  5* 6 <5* 6 6  CREATININE 0.52 0.45 0.41* 0.41* 0.56 0.40*  CALCIUM  --  8.0* 8.6* 8.4* 8.0* 8.3*  MG 2.1 2.0 2.4 1.9 1.8 2.2  PHOS 2.9  --   --   --   --   --    GFR: Estimated Creatinine Clearance: 89.1 mL/min (Arisbel Maione) (by C-G formula based on SCr of 0.4 mg/dL (L)). Liver Function Tests: Recent Labs  Lab 09/07/18 1250 09/09/18 0449 09/10/18 0327 09/12/18 0440  AST '30 20 15 ' 14*  ALT 38 '23 19 16  ' ALKPHOS 282* 211* 180*  150*  BILITOT 0.3 0.3 0.2* <0.1*  PROT 6.6 6.4* 6.0* 5.7*  ALBUMIN 2.8* 2.5* 2.5* 2.4*   No results for input(s): LIPASE, AMYLASE in the last 168 hours. No results for input(s): AMMONIA in the last 168 hours. Coagulation Profile: Recent Labs  Lab 09/07/18 1555  INR 0.9   Cardiac Enzymes: No results for input(s): CKTOTAL, CKMB, CKMBINDEX, TROPONINI in the last 168 hours. BNP (last 3 results) No results for input(s): PROBNP in the last 8760 hours. HbA1C: No results for input(s): HGBA1C in the last 72 hours. CBG: No results for input(s): GLUCAP in the last 168 hours. Lipid Profile: No results for input(s): CHOL, HDL, LDLCALC, TRIG, CHOLHDL, LDLDIRECT in the last 72 hours. Thyroid Function Tests: No results for input(s): TSH, T4TOTAL, FREET4, T3FREE, THYROIDAB in the last 72 hours. Anemia Panel: No results for input(s): VITAMINB12, FOLATE, FERRITIN, TIBC, IRON, RETICCTPCT in the last 72 hours. Sepsis Labs: Recent Labs  Lab 09/07/18 1813 09/09/18 0449 09/10/18 0327 09/11/18 0228 09/11/18 0550  PROCALCITON  --  <0.10 <0.10 QUESTIONABLE RESULTS, RECOMMEND RECOLLECT TO VERIFY <0.10  LATICACIDVEN 2.3*  --   --   --   --     Recent Results (from the past 240 hour(s))  SARS Coronavirus 2 (CEPHEID - Performed in [place] hospital lab), Hosp Order     Status: None   Collection Time: 09/07/18  3:56 PM   Specimen: Nasopharyngeal Swab  Result Value Ref Range Status   SARS Coronavirus 2 NEGATIVE  NEGATIVE Final    Comment: (NOTE) If result is NEGATIVE SARS-CoV-2 target nucleic acids are NOT DETECTED. The SARS-CoV-2 RNA is generally detectable in upper and lower  respiratory specimens during the acute phase of infection. The lowest  concentration of SARS-CoV-2 viral copies this assay can detect is 250  copies / mL. Aidynn Polendo negative result does not preclude SARS-CoV-2 infection  and should not be used as the sole basis for treatment or other  patient management decisions.  Curstin Schmale negative result may occur with  improper specimen collection / handling, submission of specimen other  than nasopharyngeal swab, presence of viral mutation(s) within the  areas targeted by this assay, and inadequate number of viral copies  (<250 copies / mL). Kraven Calk negative result must be combined with clinical  observations, patient history, and epidemiological information. If result is POSITIVE SARS-CoV-2 target nucleic acids are DETECTED. The SARS-CoV-2 RNA is generally detectable in upper and lower  respiratory specimens dur ing the acute phase of infection.  Positive  results are indicative of active infection with SARS-CoV-2.  Clinical  correlation with patient history and other diagnostic information is  necessary to determine patient infection status.  Positive results do  not rule out bacterial infection or co-infection with other viruses. If result is PRESUMPTIVE POSTIVE SARS-CoV-2 nucleic acids MAY BE PRESENT.   Natanel Snavely presumptive positive result was obtained on the submitted specimen  and confirmed on repeat testing.  While 2019 novel coronavirus  (SARS-CoV-2) nucleic acids may be present in the submitted sample  additional confirmatory testing may be necessary for epidemiological  and / or clinical management purposes  to differentiate between  SARS-CoV-2 and other Sarbecovirus currently known to infect humans.  If clinically indicated additional testing with an alternate test  methodology [phone]) is  advised. The SARS-CoV-2 RNA is generally  detectable in upper and lower respiratory sp ecimens during the acute  phase of infection. The expected result is Negative. Fact Sheet for Patients:  StrictlyIdeas.no Fact Sheet for Healthcare  Providers: BankingDealers.co.za This test is not yet approved or cleared by the [place] and has been authorized for detection and/or diagnosis of SARS-CoV-2 by FDA under an Emergency Use Authorization (EUA).  This EUA will remain in effect (meaning this test can be used) for the duration of the COVID-19 declaration under Section 564(b)(1) of the Act, 21 U.S.C. section 360bbb-3(b)(1), unless the authorization is terminated or revoked sooner. Performed at [facility], [place] [address], [place], [place] [address]   Culture, blood (Routine X 2) w Reflex to ID Panel     Status: None   Collection Time: 09/07/18  5:42 PM   Specimen: BLOOD  Result Value Ref Range Status   Specimen Description   Final    BLOOD RIGHT ANTECUBITAL Performed at [place] [address] [place], [place] [address]    Special Requests   Final    BOTTLES DRAWN AEROBIC AND ANAEROBIC Blood Culture adequate volume Performed at [place] [address] [place], [place] [address]    Culture   Final    NO GROWTH 5 DAYS Performed at [facility], [place] [address] [place], [place] [address]    Report Status 09/12/2018 FINAL  Final  Culture, blood (Routine X 2) w Reflex to ID Panel     Status: None   Collection Time: 09/07/18  5:55 PM   Specimen: BLOOD  Result Value Ref Range Status   Specimen Description   Final    BLOOD RIGHT PORTA CATH CHEST Performed at [place] [address] [place], [place] [address]    Special Requests   Final    BOTTLES DRAWN AEROBIC AND ANAEROBIC Blood Culture adequate volume Performed at [place] [address], [place], [place] [address]    Culture   Final    NO GROWTH 5 DAYS Performed at [facility], [place] [address], [place], [place] [address]    Report Status 09/12/2018 FINAL  Final  Respiratory Panel by PCR     Status: None   Collection Time: 09/07/18 11:01 PM   Specimen: Nasopharyngeal Swab; Respiratory  Result Value Ref Range Status   Adenovirus NOT DETECTED NOT DETECTED Final   Coronavirus 229E NOT DETECTED NOT DETECTED Final    Comment: (NOTE) The Coronavirus on the Respiratory Panel, DOES NOT test for the novel  Coronavirus (2019 nCoV)    Coronavirus HKU1 NOT DETECTED NOT DETECTED Final   Coronavirus NL63 NOT DETECTED NOT DETECTED Final   Coronavirus OC43 NOT DETECTED NOT DETECTED Final   Metapneumovirus NOT DETECTED NOT DETECTED Final   Rhinovirus / Enterovirus NOT DETECTED NOT DETECTED Final   Influenza Jacaria Colburn NOT DETECTED NOT DETECTED Final   Influenza B NOT DETECTED NOT DETECTED Final   Parainfluenza Virus 1 NOT DETECTED NOT DETECTED Final   Parainfluenza Virus 2 NOT DETECTED NOT DETECTED Final   Parainfluenza Virus 3 NOT DETECTED NOT DETECTED Final   Parainfluenza Virus 4 NOT DETECTED NOT DETECTED Final   Respiratory Syncytial Virus NOT DETECTED NOT DETECTED Final   Bordetella pertussis NOT DETECTED NOT DETECTED Final   Chlamydophila pneumoniae NOT DETECTED NOT DETECTED Final   Mycoplasma pneumoniae NOT DETECTED NOT DETECTED Final    Comment: Performed at [facility], [place] [address] [place], [place] [address]  MRSA PCR Screening     Status: None   Collection Time: 09/08/18 12:33 AM   Specimen: Nasal Mucosa; Nasopharyngeal  Result Value  Ref Range Status   MRSA by PCR NEGATIVE NEGATIVE Final    Comment:        The GeneXpert MRSA Assay (FDA approved for NASAL specimens only), is one component of Tanda Morrissey comprehensive MRSA colonization surveillance program. It is not intended to diagnose MRSA infection nor to guide or monitor  treatment for MRSA infections. Performed at [facility], [place] [address], [place], [place] [address]   SARS Coronavirus 2 [facility] order, Performed in [facility] hospital lab)     Status: None   Collection Time: 09/08/18 10:31 AM   Specimen: Nasopharyngeal Swab  Result Value Ref Range Status   SARS Coronavirus 2 NEGATIVE NEGATIVE Final    Comment: (NOTE) If result is NEGATIVE SARS-CoV-2 target nucleic acids are NOT DETECTED. The SARS-CoV-2 RNA is generally detectable in upper and lower  respiratory specimens during the acute phase of infection. The lowest  concentration of SARS-CoV-2 viral copies this assay can detect is 250  copies / mL. Nunzio Banet negative result does not preclude SARS-CoV-2 infection  and should not be used as the sole basis for treatment or other  patient management decisions.  Taletha Twiford negative result may occur with  improper specimen collection / handling, submission of specimen other  than nasopharyngeal swab, presence of viral mutation(s) within the  areas targeted by this assay, and inadequate number of viral copies  (<250 copies / mL). Treveon Bourcier negative result must be combined with clinical  observations, patient history, and epidemiological information. If result is POSITIVE SARS-CoV-2 target nucleic acids are DETECTED. The SARS-CoV-2 RNA is generally detectable in upper and lower  respiratory specimens dur ing the acute phase of infection.  Positive  results are indicative of active infection with SARS-CoV-2.  Clinical  correlation with patient history and other diagnostic information is  necessary to determine patient infection status.  Positive results do  not rule out bacterial infection or co-infection with other viruses. If result is PRESUMPTIVE POSTIVE SARS-CoV-2 nucleic acids MAY BE PRESENT.   Krystian Ferrentino presumptive positive result was obtained on the submitted specimen  and confirmed on repeat testing.  While 2019 novel coronavirus  (SARS-CoV-2) nucleic  acids may be present in the submitted sample  additional confirmatory testing may be necessary for epidemiological  and / or clinical management purposes  to differentiate between  SARS-CoV-2 and other Sarbecovirus currently known to infect humans.  If clinically indicated additional testing with an alternate test  methodology [phone]) is advised. The SARS-CoV-2 RNA is generally  detectable in upper and lower respiratory sp ecimens during the acute  phase of infection. The expected result is Negative. Fact Sheet for Patients:  StrictlyIdeas.no Fact Sheet for Healthcare Providers: BankingDealers.co.za This test is not yet approved or cleared by the [place] FDA and has been authorized for detection and/or diagnosis of SARS-CoV-2 by FDA under an Emergency Use Authorization (EUA).  This EUA will remain in effect (meaning this test can be used) for the duration of the COVID-19 declaration under Section 564(b)(1) of the Act, 21 U.S.C. section 360bbb-3(b)(1), unless the authorization is terminated or revoked sooner. Performed at [facility], [place] [address] [place], [place] [address]   MRSA PCR Screening     Status: None   Collection Time: 09/08/18 11:45 AM   Specimen: Nasal Mucosa; Nasopharyngeal  Result Value Ref Range Status   MRSA by PCR NEGATIVE NEGATIVE Final    Comment:        The GeneXpert MRSA Assay (FDA approved for NASAL specimens  only), is one component of Sabien Umland comprehensive MRSA colonization surveillance program. It is not intended to diagnose MRSA infection nor to guide or monitor treatment for MRSA infections. Performed at [facility], [place] [address] [place], [place] [address]   Culture, sputum-assessment     Status: None   Collection Time: 09/08/18  8:00 PM   Specimen: Nasopharyngeal Swab; Sputum  Result Value Ref Range Status   Specimen Description EXPECTORATED SPUTUM   Final   Special Requests Immunocompromised  Final   Sputum evaluation   Final    THIS SPECIMEN IS ACCEPTABLE FOR SPUTUM CULTURE Performed at [facility], [place] [address] [place], [place] [address]    Report Status 09/08/2018 FINAL  Final  Culture, respiratory     Status: None   Collection Time: 09/08/18  8:00 PM  Result Value Ref Range Status   Specimen Description   Final    EXPECTORATED SPUTUM Performed at [place] [address], [place], [place] [address]    Special Requests   Final    Immunocompromised Reflexed from [phone] Performed at [facility], [place] [address] [place], [place] [address]    Gram Stain   Final    FEW WBC PRESENT, PREDOMINANTLY PMN NO ORGANISMS SEEN    Culture   Final    Consistent with normal respiratory flora. Performed at [facility], [place] [address] [place], [place] [address]    Report Status 09/11/2018 FINAL  Final         Radiology Studies: No results found.      Scheduled Meds:  chlorhexidine  15 mL Mouth Rinse BID   Chlorhexidine Gluconate Cloth  6 each Topical Daily   dicyclomine  20 mg Oral BID AC   enoxaparin (LOVENOX) injection  40 mg Subcutaneous Q24H   FLUoxetine  80 mg Oral Daily   guaiFENesin  600 mg Oral BID   ipratropium-albuterol  3 mL Nebulization TID   mouth rinse  15 mL Mouth Rinse q12n4p   methylPREDNISolone (SOLU-MEDROL) injection  40 mg Intravenous Q12H   nicotine  21 mg Transdermal Daily   nystatin  5 mL Mouth/Throat TID   pantoprazole  40 mg Oral Daily   sucralfate  1 g Oral TID WC & HS   Continuous Infusions:  piperacillin-tazobactam (ZOSYN)  IV 3.375 g (09/12/18 0827)     LOS: 5 days    Time spent: over 59 min    Fayrene Helper, MD [facility] Pager [email or web address]  If 7PM-7AM, please contact night-coverage [email or web address] Password [email or web address] 09/12/2018, 10:20 AM

## 2018-09-12 NOTE — Progress Notes (Signed)
HEMATOLOGY-ONCOLOGY PROGRESS NOTE  SUBJECTIVE: Breathing continues to improve.  She has had a 3 L of O2.  Has remained in the 90s on 3 L at rest.  Thinks Mucinex is helping.  Afebrile.  Other vital signs are stable.  She has no other complaints this morning.  Oncology History Overview Note  Cancer Staging Metastatic non-small cell lung cancer [facility]) Staging form: Lung, AJCC 8th Edition - Clinical stage from 10/17/2017: Stage IV (cT3, cN1, cM1b) - Signed by Truitt Merle, MD on 10/17/2017     Metastatic non-small cell lung cancer ([place])  09/20/2017 Imaging   US Breast Left 09/20/17  IMPRESSION Indeterminte palpable mass measuring 3.2x1.7x2.8 cm  inferior medial to the inframammary fold of the left breast.     09/20/2017 Initial Biopsy   Diagnosis 09/20/17 Soft Tissue Needle Core Biopsy, inferior medial to IMF - ADENOCARCINOMA. - SEE MICROSCOPIC DESCRIPTION.  Microscopic Comment Immunohistochemistry will be performed and reported as an addendum. (JDP:ah 09/23/17) ADDENDUM: Immunohistochemistry shows the tumor is strongly positive with cytokeratin AE1/AE3, cytokeratin 7 and shows moderate weak positivity with CDX-2. The tumor is negative with estrogen receptor, progesterone receptor, GCDFP, GATA-3, Napsin A, TTF-1, WT-1 and cytokeratin 20. The immunophenotype is consistent with metastatic carcinoma. The combination of CDX-2 and cytokeratin 7 positivity raises the possibility of an upper gastrointestinal primary. Clinical correlation is essential.   10/06/2017 Initial Diagnosis   Metastatic adenocarcinoma involving soft tissue with unknown primary site [facility])   10/10/2017 Procedure   Upper Endoscopy by Dr. Silverio Decamp 10/10/17  IMPRESSION - Normal esophagus. - Z-line regular, 35 cm from the incisors. - Gastric bypass with a normal-sized pouch and intact staple line. Gastrojejunal anastomosis characterized by healthy appearing mucosa. - No specimens collected.   10/12/2017 Imaging   CT CAP WO  Contrast 10/12/17  IMPRESSION: 7 cm central right lung mass involving the right hilum, with postobstructive collapse of the right middle lobe, highly suspicious for primary bronchogenic carcinoma. 5 cm masslike opacity in the superior segment of the right lower lobe may represent carcinoma or postobstructive pneumonitis. 3.3 cm soft tissue mass in the lower anterior chest wall soft tissues, suspicious for metastatic disease. No evidence of abdominal or pelvic metastatic disease.    10/14/2017 Imaging   MRI Brain 10/14/17  IMPRESSION: 1. No metastatic disease or acute intracranial abnormality. Normal MRI appearance of the brain. 2. 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     10/17/2017 Cancer Staging   Staging form: Lung, AJCC 8th Edition - Clinical stage from 10/17/2017: Stage IV (cT3, cN1, cM1b) - Signed by Truitt Merle, MD on 10/17/2017   10/24/2017 - 11/04/2017 Radiation Therapy    The Right lung mass was treated to 30 Gy in 10 fractions of 3 Gy   11/09/2017 - 03/24/2018 Chemotherapy   -first line chemo with carboplatin, paclitaxel, Atezolizumab and bevacizumab every 3weeks starting 11/08/17.  -Switched to maintenance therapy with avastin and atezolizumab q3weeks on 03/24/18   01/10/2018 Imaging   01/10/2018 CT CA IMPRESSION: 1. Response to therapy of central right lower lobe lung mass. Re-expansion of the right middle lobe with significantly improved right lower lobe postobstructive pneumonitis. Residual geographic airspace and ground-glass opacity within the medial right lung, primarily favored to be radiation induced. 2. Near complete resolution of presternal soft tissue mass. 3. New right adrenal nodule since 10/11/2017, suspicious for metastatic disease. 4. Right-sided Port-A-Cath with probable nonocclusive thrombus along the catheter within the right brachiocephalic vein. 5. Age advanced coronary artery atherosclerosis. Recommend assessment of coronary risk  factors and consideration of medical therapy. 6. Aortic atherosclerosis (ICD10-I70.0) and emphysema (ICD10-J43.9).   03/10/2018 Imaging   CT CAP W Contrast 03/10/18 IMPRESSION: 1. Radiation changes involving the right paramediastinal lung and right hilum but no findings suspicious for residual or recurrent tumor. 2. No evidence of pulmonary metastatic disease. Stable emphysematous changes and areas of pulmonary scarring. 3. Stable 11 mm right adrenal gland nodule. 4. No findings for metastatic disease involving the liver or bony structures.   03/24/2018 - 04/24/2018 Chemotherapy   The patient had bevacizumab (AVASTIN) 1,300 mg in sodium chloride 0.9 % 100 mL chemo infusion, 15.5 mg/kg = 1,275 mg, Intravenous,  Once, 1 of 4 cycles Administration: 1,300 mg (03/24/2018) atezolizumab (TECENTRIQ) 1,200 mg in sodium chloride 0.9 % 250 mL chemo infusion, 1,200 mg, Intravenous, Once, 1 of 4 cycles Administration: 1,200 mg (03/24/2018)  for chemotherapy treatment.    04/11/2018 Imaging   CT ANGIO CHEST PE W OR WO CONTRAST  IMPRESSION: 1. No evidence of acute pulmonary embolism. 2. Progressive radiation changes in the right perihilar region. Cavitary retro hilar mass and right paratracheal lymph node have enlarged, suspicious for local recurrence/disease progression. 3. Enlarging right adrenal nodule suspicious for metastatic disease. 4. New right pleural effusion with increased atelectasis at both lung bases. 5. These results will be called to the ordering clinician or representative by the Radiology Department at the imaging location.    04/21/2018 Imaging   NUCLEAR MEDICINE PET SKULL BASE TO THIGH IMPRESSION: 1. Marked hypermetabolism in the amorphous soft tissue involving the right hilum. This represents the central component of the apparent radiation scarring. 2. Hypermetabolic mediastinal nodal metastases with nodal metastases identified in the anterior right juxta diaphragmatic  fat. 3. Hypermetabolic right adrenal metastasis. 4. Interval progression of loculated right pleural effusion.    05/02/2018 - 07/25/2018 Chemotherapy   Second line chemo Alimta 500mg /m2 very 3 weeks    07/21/2018 Imaging   CT CAP 07/21/18  IMPRESSION: 1. Enlarging right adrenal metastatic lesion a mildly enlarging mediastinal adenopathy compatible with mild progression of disease. No new metastatic foci are identified. 2. Evolutionary findings along the right perihilar/paramediastinal radiation fibrosis including some improvement in aeration in the right upper lobe, but enlargement of the cavitary/centrally necrotic region in the superior segment right lower lobe, which is gas-filled and which probably connects to the otherwise focally occluded bronchus intermedius. 3. Other imaging findings of potential clinical significance: Probable left anterior descending coronary atherosclerosis. Mild to moderate intrahepatic biliary dilatation mild extrahepatic biliary dilatation, much of which may be a physiologic response to cholecystectomy. Prominent stool throughout the colon favors constipation. Aortic Atherosclerosis (ICD10-I70.0).   07/26/2018 -  Chemotherapy   The patient had pegfilgrastim-cbqv (UDENYCA) injection 6 mg, 6 mg, Subcutaneous, Once, 0 of 4 cycles DOCEtaxel (TAXOTERE) 140 mg in sodium chloride 0.9 % 250 mL chemo infusion, 75 mg/m2 = 140 mg, Intravenous,  Once, 2 of 6 cycles Administration: 140 mg (07/26/2018), 140 mg (08/17/2018) ramucirumab (CYRAMZA) 800 mg in sodium chloride 0.9 % 170 mL chemo infusion, 10 mg/kg = 800 mg, Intravenous, Once, 2 of 6 cycles Administration: 800 mg (07/26/2018), 800 mg (08/17/2018)  for chemotherapy treatment.       REVIEW OF SYSTEMS:   Constitutional: Denies fevers, chills Eyes: Denies blurriness of vision Ears, nose, mouth, throat, and face: Denies mucositis or sore throat Respiratory: Shortness of breath is improving.  O2 at 3L.  Reports  nonproductive cough but feels as though she can cough up mucus if she can get up;  thinks Mucinex is helping. Cardiovascular: Denies palpitation, chest discomfort Gastrointestinal: Denies abdominal pain, nausea, vomiting.  No diarrhea or constipation. Skin: Denies abnormal skin rashes Lymphatics: Denies new lymphadenopathy or easy bruising Neurological:Denies numbness, tingling or new weaknesses Behavioral/Psych: Mood is stable, no new changes  Extremities: No lower extremity edema All other systems were reviewed with the patient and are negative.  I have reviewed the past medical history, past surgical history, social history and family history with the patient and they are unchanged from previous note.   PHYSICAL EXAMINATION: ECOG PERFORMANCE STATUS: 2 - Symptomatic, <50% confined to bed  Vitals:   09/12/18 0823 09/12/18 0830  BP:    Pulse:    Resp:    Temp:    SpO2: 98% 100%   Filed Weights   09/07/18 2039  Weight: 178 lb 12.7 oz (81.1 kg)    Intake/Output from previous day: 06/22 0701 - 06/23 0700 In: 150 [IV Piggyback:150] Out: 1000 [Urine:1000]  GENERAL:alert, no distress and comfortable SKIN: skin color, texture, turgor are normal, no rashes or significant lesions EYES: normal, Conjunctiva are pink and non-injected, sclera clear OROPHARYNX:no exudate, no erythema and lips, buccal mucosa, and tongue normal  NECK: supple, thyroid normal size, non-tender, without nodularity LYMPH:  no palpable lymphadenopathy in the cervical, axillary or inguinal LUNGS: Diffuse expiratory wheezes; improved compared to yesterday. HEART: regular rate & rhythm and no murmurs and no lower extremity edema ABDOMEN:abdomen soft, non-tender and normal bowel sounds Musculoskeletal:no cyanosis of digits and no clubbing  NEURO: alert & oriented x 3 with fluent speech, no focal motor/sensory deficits  LABORATORY DATA:  I have reviewed the data as listed CMP Latest Ref Rng & Units 09/12/2018  09/11/2018 09/10/2018  Glucose 70 - 99 mg/dL 116(H) 80 118(H)  BUN 6 - 20 mg/dL 6 6 <5(L)  Creatinine 0.44 - 1.00 mg/dL 0.40(L) 0.56 0.41(L)  Sodium 135 - 145 mmol/L 139 136 138  Potassium 3.5 - 5.1 mmol/L 4.4 3.2(L) 3.6  Chloride 98 - 111 mmol/L 102 106 101  CO2 22 - 32 mmol/L 30 19(L) 28  Calcium 8.9 - 10.3 mg/dL 8.3(L) 8.0(L) 8.4(L)  Total Protein 6.5 - 8.1 g/dL 5.7(L) - 6.0(L)  Total Bilirubin 0.3 - 1.2 mg/dL <0.1(L) - 0.2(L)  Alkaline Phos 38 - 126 U/L 150(H) - 180(H)  AST 15 - 41 U/L 14(L) - 15  ALT 0 - 44 U/L 16 - 19    Lab Results  Component Value Date   WBC 9.9 09/12/2018   HGB 10.1 (L) 09/12/2018   HCT 32.4 (L) 09/12/2018   MCV 97.0 09/12/2018   PLT 436 (H) 09/12/2018   NEUTROABS 10.6 (H) 09/08/2018    Dg Chest 2 View  Result Date: 09/07/2018 CLINICAL DATA:  Shortness of breath EXAM: CHEST - 2 VIEW COMPARISON:  08/29/2018, CT 07/21/2018 FINDINGS: Right-sided central venous port tip over the cavoatrial region. Right parahilar architectural distortion with cavitary lesion. Slight increased ground-glass opacity at the periphery. New ground-glass opacity within the lingula and left lung base. Stable heart size. No pneumothorax. IMPRESSION: 1. New ground-glass opacity within the lingula, left base and right upper lobe, suspect for multifocal infection, to include possible atypical or viral pneumonia. 2. Right hilar/perihilar architectural distortion with cavitary lesion as noted on previous CT examinations. Electronically Signed   By: Donavan Foil M.D.   On: 09/07/2018 16:02   Dg Chest 2 View  Result Date: 08/30/2018 CLINICAL DATA:  Shortness of breath, nonproductive cough. Lung cancer. EXAM: CHEST - 2 VIEW  COMPARISON:  CT chest 07/21/2018 and chest radiograph 04/30/2018. FINDINGS: Trachea is midline. Heart size normal. Right IJ power port terminates in the low SVC or SVC RA junction. Post treatment parenchymal retraction in the right perihilar region with a focal area of lucency,  better evaluated on 07/21/2018. Associated volume loss in the right hemithorax. Biapical pleuroparenchymal scarring, left greater than right. Lungs are otherwise clear. No pleural fluid. IMPRESSION: 1. No acute findings. 2. Post treatment changes in the right hemithorax, better evaluated on 07/21/2018. Electronically Signed   By: Lorin Picket M.D.   On: 08/30/2018 08:36   Ct Angio Chest Pe W/cm &/or Wo Cm  Result Date: 09/07/2018 CLINICAL DATA:  Lung cancer.  Shortness of breath. EXAM: CT ANGIOGRAPHY CHEST WITH CONTRAST TECHNIQUE: Multidetector CT imaging of the chest was performed using the standard protocol during bolus administration of intravenous contrast. Multiplanar CT image reconstructions and MIPs were obtained to evaluate the vascular anatomy. CONTRAST:  127mL OMNIPAQUE IOHEXOL 350 MG/ML SOLN COMPARISON:  CT dated 04/28/2018. CT from Jul 21, 2018 could not be retrieved for comparison. FINDINGS: Cardiovascular: There is a well-positioned right-sided Port-A-Cath. Evaluation for pulmonary emboli is severely limited by motion artifact and suboptimal contrast bolus timing. The main pulmonary artery as well as the left and right pulmonary arteries are dilated. The heart size is normal. There is no significant pericardial effusion. Coronary artery calcifications are noted. Mediastinum/Nodes: Again noted are pathologically enlarged mediastinal lymph nodes. There is a right superior paratracheal lymph node measuring approximately 1.6 cm (previously measuring 1.3 cm. There is an interval increase in size of multiple left AP window lymph nodes when compared to CT from February 2020. There are pathologically enlarged right hilar lymph nodes. Amorphous soft tissues again noted in the right perihilar region. There are no pathologically enlarged axillary lymph nodes. No pathologically enlarged supraclavicular lymph nodes. Lungs/Pleura: There are ground-glass airspace opacities involving the left upper, left lower,  and right upper lobes. There is a 4.2 cm cavitary air-filled structure in the right lower lobe. There is improved aeration within the right middle lobe when compared to CT from April 23, 2018. There is a small right-sided pleural effusion. Upper Abdomen: Again noted is a right adrenal mass measuring approximately 3.1 x 3 cm. The left adrenal gland is only partially visualized and is unremarkable. There is mild intrahepatic biliary ductal dilatation which appears stable from prior studies. There are surgical clips near the GE junction. 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 Musculoskeletal: There is a growing soft tissue nodule in the anterior midline chest measuring approximately 1.5 cm (axial series 4, image 73). There is no acute displaced fracture. Review of the MIP images confirms the above findings. IMPRESSION: 1. Detection of pulmonary emboli is severely limited by motion artifact and suboptimal contrast bolus timing. Given this limitation, no PE was identified. The pulmonary arteries are dilated which can be seen in patients with elevated pulmonary artery pressures. 2. Diffuse ground-glass airspace opacities involving the right upper lobe, left upper lobe, and left lower lobe. Findings are concerning for multifocal pneumonia (viral or bacterial). 3. Persistent ill-defined soft tissue in the right perihilar region with multiple pathologically enlarged mediastinal and hilar lymph nodes. There is a growing air-filled cavitary area within the right perihilar region which may be related to post treatment changes. 4. Small right-sided pleural effusion. 5. Growing subcutaneous soft tissue nodule in the anterior midline left chest wall as detailed above. This could represent a metastatic deposit. Again noted  is a right adrenal metastatic lesion as detailed above. Aortic Atherosclerosis (ICD10-I70.0). Electronically Signed   By: Constance Holster M.D.   On: 09/07/2018 17:08    ASSESSMENT AND  PLAN: 1.  Hypoxia, respiratory distress 2.  Community-acquired pneumonia 3.  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  COPD 5.  Mild hypokalemia; resolved 6.  Anxiety/depression 7.  Leukocytosis secondary to infection 8.  Anemia secondary to recent chemotherapy 9.  Goals of care: Full code  -Respiratory status is improving.  Remains on 3 L of oxygen.  Continue Mucinex. -Antibiotics per hospitalist for pneumonia -The patient completed her second cycle of Taxotere and Cyramza on 08/17/2018.  Therapy held on 09/07/1118 secondary to hypoxia and respiratory distress.  Will resume treatment for her cancer as an outpatient once she has recovered from her acute illness.  Low likelihood for pneumonitis from this particular chemotherapy regimen. -Continue steroids.  Wean as respiratory status improves. -Continue fluoxetine.  Continue low-dose Xanax. -Monitor daily CBC.  Transfuse packed red blood cells if her hemoglobin is less than 7.0 or active bleeding.  No transfusion is indicated. -Goals of care: she understands her lung cancer is terminal, she has had 3 lines of chemotherapy, overall prognosis is very.  She is hoping her pneumonia is treatable, and she wants to be intubated if needed for her pneumonia treatment, but agrees not to be on life support for long time.     LOS: 5 days   Mikey Bussing, DNP, AGPCNP-BC, AOCNP 09/12/18

## 2018-09-13 DIAGNOSIS — C3490 Malignant neoplasm of unspecified part of unspecified bronchus or lung: Secondary | ICD-10-CM

## 2018-09-13 DIAGNOSIS — J181 Lobar pneumonia, unspecified organism: Secondary | ICD-10-CM

## 2018-09-13 DIAGNOSIS — J441 Chronic obstructive pulmonary disease with (acute) exacerbation: Secondary | ICD-10-CM

## 2018-09-13 DIAGNOSIS — J9601 Acute respiratory failure with hypoxia: Secondary | ICD-10-CM

## 2018-09-13 LAB — COMPREHENSIVE METABOLIC PANEL
ALT: 23 U/L (ref 0–44)
AST: 41 U/L (ref 15–41)
Albumin: 2.4 g/dL — ABNORMAL LOW (ref 3.5–5.0)
Alkaline Phosphatase: 130 U/L — ABNORMAL HIGH (ref 38–126)
Anion gap: 8 (ref 5–15)
BUN: 6 mg/dL (ref 6–20)
CO2: 31 mmol/L (ref 22–32)
Calcium: 8.3 mg/dL — ABNORMAL LOW (ref 8.9–10.3)
Chloride: 101 mmol/L (ref 98–111)
Creatinine, Ser: 0.37 mg/dL — ABNORMAL LOW (ref 0.44–1.00)
GFR calc Af Amer: >60 mL/min (ref >60)
GFR calc non Af Amer: >60 mL/min (ref >60)
Glucose, Bld: 89 mg/dL (ref 70–99)
Potassium: 3.2 mmol/L — ABNORMAL LOW (ref 3.5–5.1)
Sodium: 140 mmol/L (ref 135–145)
Total Bilirubin: 0.2 mg/dL — ABNORMAL LOW (ref 0.3–1.2)
Total Protein: 5.7 g/dL — ABNORMAL LOW (ref 6.5–8.1)

## 2018-09-13 LAB — CBC
HCT: 33 % — ABNORMAL LOW (ref 36.0–46.0)
Hemoglobin: 9.8 g/dL — ABNORMAL LOW (ref 12.0–15.0)
MCH: 28.8 pg (ref 26.0–34.0)
MCHC: 29.7 g/dL — ABNORMAL LOW (ref 30.0–36.0)
MCV: 97.1 fL (ref 80.0–100.0)
Platelets: 457 10*3/uL — ABNORMAL HIGH (ref 150–400)
RBC: 3.4 MIL/uL — ABNORMAL LOW (ref 3.87–5.11)
RDW: 13.9 % (ref 11.5–15.5)
WBC: 9.1 10*3/uL (ref 4.0–10.5)
nRBC: 0 % (ref 0.0–0.2)

## 2018-09-13 LAB — MAGNESIUM: Magnesium: 2.2 mg/dL (ref 1.7–2.4)

## 2018-09-13 MED ORDER — POTASSIUM CHLORIDE CRYS ER 20 MEQ PO TBCR
40.0000 meq | EXTENDED_RELEASE_TABLET | Freq: Once | ORAL | Status: AC
  Administered 2018-09-13: 40 meq via ORAL
  Filled 2018-09-13: qty 2

## 2018-09-13 NOTE — Progress Notes (Signed)
Yvonne Lang   DOB:05-Dec-1965   ZO#:[id number]   JWJ#:[id number]  Oncology follow up   Subjective: Patient continues to improve, on nasal cannula 2 to 3 L/min, overall feels much better.  She is afebrile, with stable vital signs, eating better.    Objective:  Vitals:   09/13/18 1400 09/13/18 1500  BP: (!) 129/93   Pulse: (!) 105 (!) 105  Resp: (!) 24 18  Temp:    SpO2: 96% 96%    Body mass index is 28 kg/m.  Intake/Output Summary (Last 24 hours) at 09/13/2018 1611 Last data filed at 09/13/2018 1500 Gross per 24 hour  Intake 660.69 ml  Output -  Net 660.69 ml     Sclerae unicteric  Lungs clear -- no rales or rhonchi  Heart regular rate and rhythm  Abdomen benign  MSK no focal spinal tenderness, no peripheral edema  Neuro nonfocal   CBG (last 3)  No results for input(s): GLUCAP in the last 72 hours.   Labs:  Urine Studies No results for input(s): UHGB, CRYS in the last 72 hours.  Invalid input(s): UACOL, UAPR, USPG, UPH, UTP, UGL, [place], UBIL, UNIT, UROB, [place], UEPI, UWBC, [place], [place], [place], [place], [place]  Basic Metabolic Panel: Recent Labs  Lab 09/07/18 2106  09/09/18 0449 09/10/18 0327 09/11/18 0735 09/12/18 0440 09/13/18 0459  NA  --    < > 139 138 136 139 140  K  --    < > 5.0 3.6 3.2* 4.4 3.2*  CL  --    < > 102 101 106 102 101  CO2  --    < > 28 28 19* 30 31  GLUCOSE  --    < > 118* 118* 80 116* 89  BUN  --    < > 6 <5* 6 6 6   CREATININE 0.52   < > 0.41* 0.41* 0.56 0.40* 0.37*  CALCIUM  --    < > 8.6* 8.4* 8.0* 8.3* 8.3*  MG 2.1   < > 2.4 1.9 1.8 2.2 2.2  PHOS 2.9  --   --   --   --   --   --    < > = values in this interval not displayed.   GFR Estimated Creatinine Clearance: 89.1 mL/min (A) (by C-G formula based on SCr of 0.37 mg/dL (L)). Liver Function Tests: Recent Labs  Lab 09/07/18 1250 09/09/18 0449 09/10/18 0327 09/12/18 0440 09/13/18 0459  AST 30 20 15  14* 41  ALT 38 23 19 16 23   ALKPHOS 282* 211* 180* 150* 130*  BILITOT 0.3 0.3  0.2* <0.1* 0.2*  PROT 6.6 6.4* 6.0* 5.7* 5.7*  ALBUMIN 2.8* 2.5* 2.5* 2.4* 2.4*   No results for input(s): LIPASE, AMYLASE in the last 168 hours. No results for input(s): AMMONIA in the last 168 hours. Coagulation profile Recent Labs  Lab 09/07/18 1555  INR 0.9    CBC: Recent Labs  Lab 09/07/18 1250  09/08/18 0607 09/09/18 0449 09/10/18 0327 09/11/18 0930 09/12/18 0440 09/13/18 0459  WBC 28.9*   < > 13.8* 11.8* 12.7* 9.9 9.9 9.1  NEUTROABS 25.3*  --  10.6*  --   --   --   --   --   HGB 11.7*   < > 10.4* 10.4* 10.5* 10.4* 10.1* 9.8*  HCT 36.9   < > 32.9* 34.7* 34.9* 33.3* 32.4* 33.0*  MCV 93.4   < > 95.9 96.1 95.4 96.0 97.0 97.1  PLT 393   < > 356  387 431* 432* 436* 457*   < > = values in this interval not displayed.   Cardiac Enzymes: No results for input(s): CKTOTAL, CKMB, CKMBINDEX, TROPONINI in the last 168 hours. BNP: Invalid input(s): POCBNP CBG: No results for input(s): GLUCAP in the last 168 hours. D-Dimer No results for input(s): DDIMER in the last 72 hours. Hgb A1c No results for input(s): HGBA1C in the last 72 hours. Lipid Profile No results for input(s): CHOL, HDL, LDLCALC, TRIG, CHOLHDL, LDLDIRECT in the last 72 hours. Thyroid function studies No results for input(s): TSH, T4TOTAL, T3FREE, THYROIDAB in the last 72 hours.  Invalid input(s): FREET3 Anemia work up No results for input(s): VITAMINB12, FOLATE, FERRITIN, TIBC, IRON, RETICCTPCT in the last 72 hours. Microbiology Recent Results (from the past 240 hour(s))  SARS Coronavirus 2 (CEPHEID - Performed in [facility] hospital lab), Hosp Order     Status: None   Collection Time: 09/07/18  3:56 PM   Specimen: Nasopharyngeal Swab  Result Value Ref Range Status   SARS Coronavirus 2 NEGATIVE NEGATIVE Final    Comment: (NOTE) If result is NEGATIVE SARS-CoV-2 target nucleic acids are NOT DETECTED. The SARS-CoV-2 RNA is generally detectable in upper and lower  respiratory specimens during the acute  phase of infection. The lowest  concentration of SARS-CoV-2 viral copies this assay can detect is 250  copies / mL. A negative result does not preclude SARS-CoV-2 infection  and should not be used as the sole basis for treatment or other  patient management decisions.  A negative result may occur with  improper specimen collection / handling, submission of specimen other  than nasopharyngeal swab, presence of viral mutation(s) within the  areas targeted by this assay, and inadequate number of viral copies  (<250 copies / mL). A negative result must be combined with clinical  observations, patient history, and epidemiological information. If result is POSITIVE SARS-CoV-2 target nucleic acids are DETECTED. The SARS-CoV-2 RNA is generally detectable in upper and lower  respiratory specimens dur ing the acute phase of infection.  Positive  results are indicative of active infection with SARS-CoV-2.  Clinical  correlation with patient history and other diagnostic information is  necessary to determine patient infection status.  Positive results do  not rule out bacterial infection or co-infection with other viruses. If result is PRESUMPTIVE POSTIVE SARS-CoV-2 nucleic acids MAY BE PRESENT.   A presumptive positive result was obtained on the submitted specimen  and confirmed on repeat testing.  While 2019 novel coronavirus  (SARS-CoV-2) nucleic acids may be present in the submitted sample  additional confirmatory testing may be necessary for epidemiological  and / or clinical management purposes  to differentiate between  SARS-CoV-2 and other Sarbecovirus currently known to infect humans.  If clinically indicated additional testing with an alternate test  methodology [phone]) is advised. The SARS-CoV-2 RNA is generally  detectable in upper and lower respiratory sp ecimens during the acute  phase of infection. The expected result is Negative. Fact Sheet for Patients:   StrictlyIdeas.no Fact Sheet for Healthcare Providers: BankingDealers.co.za This test is not yet approved or cleared by the [place] FDA and has been authorized for detection and/or diagnosis of SARS-CoV-2 by FDA under an Emergency Use Authorization (EUA).  This EUA will remain in effect (meaning this test can be used) for the duration of the COVID-19 declaration under Section 564(b)(1) of the Act, 21 U.S.C. section 360bbb-3(b)(1), unless the authorization is terminated or revoked sooner. Performed at [facility], [address] [place], [place] [address]   Culture, blood (Routine X 2) w Reflex to ID Panel     Status: None   Collection Time: 09/07/18  5:42 PM   Specimen: BLOOD  Result Value Ref Range Status   Specimen Description   Final    BLOOD RIGHT ANTECUBITAL Performed at [facility] [address], [place], [place] [address]    Special Requests   Final    BOTTLES DRAWN AEROBIC AND ANAEROBIC Blood Culture adequate volume Performed at [facility] [address] [place], [place] [address]    Culture   Final    NO GROWTH 5 DAYS Performed at [facility], [place] [address] [place], [place] [address]    Report Status 09/12/2018 FINAL  Final  Culture, blood (Routine X 2) w Reflex to ID Panel     Status: None   Collection Time: 09/07/18  5:55 PM   Specimen: BLOOD  Result Value Ref Range Status   Specimen Description   Final    BLOOD RIGHT PORTA CATH CHEST Performed at [facility] [address], [place], [place] [address]    Special Requests   Final    BOTTLES DRAWN AEROBIC AND ANAEROBIC Blood Culture adequate volume Performed at [facility] [address] [place], [place] [address]    Culture   Final    NO GROWTH 5 DAYS Performed at [facility], [place] [address] [place], [place] [address]    Report Status  09/12/2018 FINAL  Final  Respiratory Panel by PCR     Status: None   Collection Time: 09/07/18 11:01 PM   Specimen: Nasopharyngeal Swab; Respiratory  Result Value Ref Range Status   Adenovirus NOT DETECTED NOT DETECTED Final   Coronavirus 229E NOT DETECTED NOT DETECTED Final    Comment: (NOTE) The Coronavirus on the Respiratory Panel, DOES NOT test for the novel  Coronavirus (2019 nCoV)    Coronavirus HKU1 NOT DETECTED NOT DETECTED Final   Coronavirus NL63 NOT DETECTED NOT DETECTED Final   Coronavirus OC43 NOT DETECTED NOT DETECTED Final   Metapneumovirus NOT DETECTED NOT DETECTED Final   Rhinovirus / Enterovirus NOT DETECTED NOT DETECTED Final   Influenza A NOT DETECTED NOT DETECTED Final   Influenza B NOT DETECTED NOT DETECTED Final   Parainfluenza Virus 1 NOT DETECTED NOT DETECTED Final   Parainfluenza Virus 2 NOT DETECTED NOT DETECTED Final   Parainfluenza Virus 3 NOT DETECTED NOT DETECTED Final   Parainfluenza Virus 4 NOT DETECTED NOT DETECTED Final   Respiratory Syncytial Virus NOT DETECTED NOT DETECTED Final   Bordetella pertussis NOT DETECTED NOT DETECTED Final   Chlamydophila pneumoniae NOT DETECTED NOT DETECTED Final   Mycoplasma pneumoniae NOT DETECTED NOT DETECTED Final    Comment: Performed at [facility], [place] [address] [place], [place] [address]  MRSA PCR Screening     Status: None   Collection Time: 09/08/18 12:33 AM   Specimen: Nasal Mucosa; Nasopharyngeal  Result Value Ref Range Status   MRSA by PCR NEGATIVE NEGATIVE Final    Comment:        The GeneXpert MRSA Assay (FDA approved for NASAL specimens only), is one component of a comprehensive MRSA colonization surveillance program. It is not intended to diagnose MRSA infection nor to guide or monitor treatment for MRSA infections. Performed at [facility], [place] [address], [place], [place] [address]   SARS Coronavirus 2 [facility]  order, Performed in [place] hospital lab)      Status: None   Collection Time: 09/08/18 10:31 AM   Specimen: Nasopharyngeal Swab  Result Value Ref Range Status   SARS Coronavirus 2 NEGATIVE NEGATIVE Final    Comment: (NOTE) If result is NEGATIVE SARS-CoV-2 target nucleic acids are NOT DETECTED. The SARS-CoV-2 RNA is generally detectable in upper and lower  respiratory specimens during the acute phase of infection. The lowest  concentration of SARS-CoV-2 viral copies this assay can detect is 250  copies / mL. A negative result does not preclude SARS-CoV-2 infection  and should not be used as the sole basis for treatment or other  patient management decisions.  A negative result may occur with  improper specimen collection / handling, submission of specimen other  than nasopharyngeal swab, presence of viral mutation(s) within the  areas targeted by this assay, and inadequate number of viral copies  (<250 copies / mL). A negative result must be combined with clinical  observations, patient history, and epidemiological information. If result is POSITIVE SARS-CoV-2 target nucleic acids are DETECTED. The SARS-CoV-2 RNA is generally detectable in upper and lower  respiratory specimens dur ing the acute phase of infection.  Positive  results are indicative of active infection with SARS-CoV-2.  Clinical  correlation with patient history and other diagnostic information is  necessary to determine patient infection status.  Positive results do  not rule out bacterial infection or co-infection with other viruses. If result is PRESUMPTIVE POSTIVE SARS-CoV-2 nucleic acids MAY BE PRESENT.   A presumptive positive result was obtained on the submitted specimen  and confirmed on repeat testing.  While 2019 novel coronavirus  (SARS-CoV-2) nucleic acids may be present in the submitted sample  additional confirmatory testing may be necessary for epidemiological  and / or clinical management purposes  to differentiate between  SARS-CoV-2 and other  Sarbecovirus currently known to infect humans.  If clinically indicated additional testing with an alternate test  methodology [phone]) is advised. The SARS-CoV-2 RNA is generally  detectable in upper and lower respiratory sp ecimens during the acute  phase of infection. The expected result is Negative. Fact Sheet for Patients:  StrictlyIdeas.no Fact Sheet for Healthcare Providers: BankingDealers.co.za This test is not yet approved or cleared by the [place] FDA and has been authorized for detection and/or diagnosis of SARS-CoV-2 by FDA under an Emergency Use Authorization (EUA).  This EUA will remain in effect (meaning this test can be used) for the duration of the COVID-19 declaration under Section 564(b)(1) of the Act, 21 U.S.C. section 360bbb-3(b)(1), unless the authorization is terminated or revoked sooner. Performed at [facility], [place] [address] [place], [place] [address]   MRSA PCR Screening     Status: None   Collection Time: 09/08/18 11:45 AM   Specimen: Nasal Mucosa; Nasopharyngeal  Result Value Ref Range Status   MRSA by PCR NEGATIVE NEGATIVE Final    Comment:        The GeneXpert MRSA Assay (FDA approved for NASAL specimens only), is one component of a comprehensive MRSA colonization surveillance program. It is not intended to diagnose MRSA infection nor to guide or monitor treatment for MRSA infections. Performed at [facility], [place] [address] [place], [place] [address]   Culture, sputum-assessment     Status: None   Collection Time: 09/08/18  8:00 PM   Specimen: Nasopharyngeal Swab; Sputum  Result Value Ref Range Status   Specimen Description EXPECTORATED SPUTUM  Final   Special Requests Immunocompromised  Final   Sputum evaluation   Final    THIS SPECIMEN IS ACCEPTABLE FOR SPUTUM CULTURE Performed at [facility], [place] [address]  [place], [place] [address]    Report Status 09/08/2018 FINAL  Final  Culture, respiratory     Status: None   Collection Time: 09/08/18  8:00 PM  Result Value Ref Range Status   Specimen Description   Final    EXPECTORATED SPUTUM Performed at [facility] [address] [place], [place] [address]    Special Requests   Final    Immunocompromised Reflexed from [id number] Performed at [facility], [place] [address], [place], [place] [address]    Gram Stain   Final    FEW WBC PRESENT, PREDOMINANTLY PMN NO ORGANISMS SEEN    Culture   Final    Consistent with normal respiratory flora. Performed at [facility], [place] [address] [place], [place] [address]    Report Status 09/11/2018 FINAL  Final      Studies:  No results found.  Assessment: 53 y.o. female   1.  Hypoxic respiratory failure, improving  2.  Community-acquired pneumonia, vs pneumonitis  3.  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  COPD 5.  Mild hypokalemia  6.  Anxiety/depression 7.  Leukocytosis secondary to infection and steroids, resolved  8.  Anemia secondary to recent chemotherapy 9.  code status: conditional DNR/DNI     Plan:  -pt continues to improved, requires less oxygen now -will complete 7 days iv zosyn tomorrow, if she is stable, I think she can be discharged home tomorrow with home oxygen, please arrange.  -please continue prednisone 40mg  daily on discharge, I will wean it off in 2-3 weeks -I wills et up a phone visit with me next week, and see her back in clinic on 7/9 as scheduled. If her condition continues to improve, she would like to continue chemo, I will likely change her regimen. -Pt appreciated my call to her mother 2 days ago, her mother and sister have became open to discuss her code status, end of life care etc if she gets worse, they will discuss more when she gets home.  -I spoke with  Dr. Erlinda Hong this morning, appreciate the excellent care from hospitalist team   Truitt Merle, MD 09/13/2018  4:11 PM

## 2018-09-13 NOTE — Evaluation (Signed)
Occupational Therapy Evaluation Patient Details Name: Yvonne Lang MRN: [id number] DOB: 1965/11/02 Today's Date: 09/13/2018    History of Present Illness 53 yo female admitted to ED on 6/18 with CAP, with acute hypoxic respiratory failure. Pt with lung cancer undergoing chemo currently. Covid test negative x2, MD still ruling it out as pt with diffuse ground glass appearance of lungs on CXR. PMH includes COPD, anxiety, bronchitis, depression, gastric bypass, IBS, PE.   Clinical Impression   This 53 y/o female presents with the above. PTA pt reports independence with ADL and functional mobility. Pt presents supine in bed pleasant and willing to participate in therapy session. Pt mostly limited due to decreased endurance/activity tolerance at this time. Pt HR up to high 120s with seated activity EOB during session, O2 sats maintaining >90% with use of supplemental O2 (2L). Pt performing functional transfers without AD at [facility] assist level, LB ADL with minguard assist and setup assist for seated UB ADL. Initiated education on energy conservation techniques during functional tasks after return home with supplemental handout provided. Pt verbalizing understanding and appreciative of information. She will benefit from continued OT services while in acute setting to maximize her safety and independence with ADL and mobility. Will follow.     Follow Up Recommendations  No OT follow up;Supervision/Assistance - 24 hour(24hr initially)    Equipment Recommendations  None recommended by OT(pt declines need for shower seat)           Precautions / Restrictions Precautions Precautions: Fall Precaution Comments: requires O2 , HR/RR increases Restrictions Weight Bearing Restrictions: No      Mobility Bed Mobility Overal bed mobility: Independent                Transfers Overall transfer level: Needs assistance Equipment used: None   Sit to Stand: Supervision         General  transfer comment: for lines/safety    Balance Overall balance assessment: Mild deficits observed, not formally tested   Sitting balance-Leahy Scale: Normal       Standing balance-Leahy Scale: Fair Standing balance comment: improved today, no loses                           ADL either performed or assessed with clinical judgement   ADL Overall ADL's : Needs assistance/impaired Eating/Feeding: Modified independent;Sitting   Grooming: Sitting;Set up   Upper Body Bathing: Set up;Sitting   Lower Body Bathing: Min guard;Sit to/from stand   Upper Body Dressing : Sitting;Set up   Lower Body Dressing: Min guard;Sit to/from stand   Toilet Transfer: Min Designer, jewellery Details (indicate cue type and reason): simulated via transfer to/from EOB Toileting- Water quality scientist and Hygiene: Min guard;Sit to/from stand       Functional mobility during ADLs: Min guard;Supervision/safety General ADL Comments: educated pt re: energy conservation techniques during functional tasks, handout providedc     Vision         Perception     Praxis      Pertinent Vitals/Pain Pain Assessment: No/denies pain     Hand Dominance Right   Extremity/Trunk Assessment Upper Extremity Assessment Upper Extremity Assessment: Generalized weakness   Lower Extremity Assessment Lower Extremity Assessment: Defer to PT evaluation   Cervical / Trunk Assessment Cervical / Trunk Assessment: Normal   Communication Communication Communication: No difficulties   Cognition Arousal/Alertness: Awake/alert Behavior During Therapy: Anxious Overall Cognitive Status: Within Functional Limits for tasks assessed  General Comments: fidgety   General Comments       Exercises     Shoulder Instructions      Home Living Family/patient expects to be discharged to:: Private residence Living Arrangements: Parent(mother) Available  Help at Discharge: Family;Available 24 hours/day Type of Home: House Home Access: Stairs to enter [employer] of Steps: 4 or 5 Entrance Stairs-Rails: Right Home Layout: One level     Bathroom Shower/Tub: Teacher, early years/pre: Standard     Home Equipment: None          Prior Functioning/Environment Level of Independence: Independent                 OT Problem List: Decreased strength;Decreased range of motion;Decreased activity tolerance;Cardiopulmonary status limiting activity;Decreased knowledge of use of DME or AE      OT Treatment/Interventions: Self-care/ADL training;Therapeutic exercise;Neuromuscular education;Energy conservation;Therapeutic activities;Patient/family education;Balance training;DME and/or AE instruction    OT Goals(Current goals can be found in the care plan section) Acute Rehab OT Goals Patient Stated Goal: go home, breathe better OT Goal Formulation: With patient Time For Goal Achievement: 09/27/18 Potential to Achieve Goals: Good  OT Frequency: Min 2X/week   Barriers to D/C:            Co-evaluation              AM-PAC OT "6 Clicks" Daily Activity     Outcome Measure Help from another person eating meals?: None Help from another person taking care of personal grooming?: None Help from another person toileting, which includes using toliet, bedpan, or urinal?: A Little Help from another person bathing (including washing, rinsing, drying)?: A Little Help from another person to put on and taking off regular upper body clothing?: None Help from another person to put on and taking off regular lower body clothing?: A Little 6 Click Score: 21   End of Session Equipment Utilized During Treatment: Oxygen Nurse Communication: Mobility status  Activity Tolerance: Patient tolerated treatment well Patient left: in bed;with call bell/phone within reach  OT Visit Diagnosis: Muscle weakness (generalized) (M62.81);Other  (comment)(decreased activity tolerance)                Time: 5027-7412 OT Time Calculation (min): 24 min Charges:  OT General Charges $OT Visit: 1 Visit OT Evaluation $OT Eval Moderate Complexity: 1 Mod OT Treatments $Self Care/Home Management : 8-22 mins  Lou Cal, OT Supplemental Rehabilitation Services Pager [phone] Office [phone]   Raymondo Band 09/13/2018, 10:50 AM

## 2018-09-13 NOTE — Progress Notes (Signed)
Physical Therapy Treatment Patient Details Name: Yvonne Lang MRN: [id number] DOB: 08-18-1965 Today's Date: 09/13/2018    History of Present Illness 53 yo female admitted to ED on 6/18 with CAP, with acute hypoxic respiratory failure. Pt with lung cancer undergoing chemo currently. Covid test negative x2, MD still ruling it out as pt with diffuse ground glass appearance of lungs on CXR. PMH includes COPD, anxiety, bronchitis, depression, gastric bypass, IBS, PE.    PT Comments    See separate note for oxygen qualification walk test. Patient requires O2 to maintain oxygen > 88?%. Patient  Does not require HHPT at this time. Continue PT    Follow Up Recommendations  No PT follow up;Supervision for mobility/OOB     Equipment Recommendations  None recommended by PT    Recommendations for Other Services       Precautions / Restrictions Precautions Precaution Comments: requires O2 , HR/RR increases Restrictions Weight Bearing Restrictions: No    Mobility  Bed Mobility Overal bed mobility: Independent                Transfers Overall transfer level: Needs assistance Equipment used: None   Sit to Stand: Supervision            Ambulation/Gait Ambulation/Gait assistance: Supervision;Min guard Gait Distance (Feet): 440 Feet     Gait velocity: decreased   General Gait Details: stopped x 3 to stand and rest for SaO2 to return > 90%.   Stairs             Wheelchair Mobility    Modified Rankin (Stroke Patients Only)       Balance     Sitting balance-Leahy Scale: Normal       Standing balance-Leahy Scale: Fair Standing balance comment: improved today, no loses                            Cognition   Behavior During Therapy: Anxious                                   General Comments: fidgety      Exercises      General Comments        Pertinent Vitals/Pain      Home Living                      Prior Function            PT Goals (current goals can now be found in the care plan section) Progress towards PT goals: Progressing toward goals    Frequency    Min 3X/week      PT Plan Current plan remains appropriate    Co-evaluation              AM-PAC PT "6 Clicks" Mobility   Outcome Measure  Help needed turning from your back to your side while in a flat bed without using bedrails?: None Help needed moving from lying on your back to sitting on the side of a flat bed without using bedrails?: None Help needed moving to and from a bed to a chair (including a wheelchair)?: None Help needed standing up from a chair using your arms (e.g., wheelchair or bedside chair)?: None Help needed to walk in hospital room?: A Little Help needed climbing 3-5 steps with a railing? : A Little 6 Click Score:  22    End of Session Equipment Utilized During Treatment: Oxygen Activity Tolerance: Patient tolerated treatment well Patient left: with call bell/phone within reach;in bed Nurse Communication: Mobility status PT Visit Diagnosis: Other abnormalities of gait and mobility (R26.89)     Time: 9447-3958 PT Time Calculation (min) (ACUTE ONLY): 27 min  Charges:  $Gait Training: 23-37 mins                      [place] Pager [phone] Office [phone]    Claretha Cooper 09/13/2018, 10:10 AM

## 2018-09-13 NOTE — Progress Notes (Signed)
SATURATION QUALIFICATIONS: (This note is used to comply with regulatory documentation for home oxygen)  Patient Saturations on Room Air at Rest = 87%  Patient Saturations on Room Air while Ambulating = 83%  Patient Saturations on 2  Liters of oxygen while Ambulating = 87%, on 3 L 94%  HR up to 140, RR up to 40  Please briefly explain why patient needs home oxygen:requires supplemental oxygen to keep saturation > 88%.  [place] Pager [phone] Office [phone]

## 2018-09-13 NOTE — Progress Notes (Signed)
PROGRESS NOTE  Yvonne Lang STM:[id number] DOB: September 19, 1965 DOA: 09/07/2018 PCP: Yvonne School, MD   Brief Narrative: Yvonne Lang a 53 y.o.femalewith a known history of COPD, depression/anxiety, GERD, irritable bowel syndrome, lung cancerpresents to the emergency department for evaluation of shortness of breath. Patient was in a usual state of health until this afternoon when she was at her oncologist office for follow-up appointment and chemotherapy when she was noted to have 1 week history of increased shortness of breath, tachycardia, tachypnea and hypoxia. Her O2 sat was around 91%. She was recently treated with prednisone for a COPD exacerbation. Her symptoms are refractory to albuterol treatments at home.She did not end up receiving her chemotherapy today  Patient denies fevers/chills, weakness, dizziness, chest pain, N/V/C/D, abdominal pain, dysuria/frequency, changes in mental status.   Otherwise there has been no change in status. Patient has been taking medication as prescribed and there has been no recent change in medication or diet. No recent antibiotics. There has been no recent illness, hospitalizations, travel or sick contacts.   She was admitted for community acquired pneumonia.  She has tested negative for COVID19 x 2.  She initially had high O2 requirements that have improved with steroids and IV antibiotics.  Now improving, transferred to med surg.    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, possible  copd exacerbation as well -improving on zosyn and steroids, will need home o2 though -appreciate oncology Dr Burr Medico input, likely able to discharge home tomorrow on prednisone after finish total of 7 days iv zosyn -case manager consulted for home 02 arrangement, she reports has nebulizer at home  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 getting chemotherapy hgb 9.8, monitor  Hypokalemia: replace k Hold lasix, appear dry  History of depression and anxiety -Continue Prozac -Hold Ativan and Ambien - caution with prn xanax  History ofGERD - ContinueProtonix or Prilosec  Code Status: full  Family Communication: patient   Disposition Plan: home with home o2 on 6/25  D/c tele Consultants:  oncology  Procedures:  none  Antibiotics:  zosyn   Objective: BP 131/78    Pulse 88    Temp 98.2 F (36.8 C) (Oral)    Resp (!) 25    Ht 5\' 7"  (1.702 m)    Wt 81.1 kg    SpO2 97%    BMI 28.00 kg/m   Intake/Output Summary (Last 24 hours) at 09/13/2018 1112 Last data filed at 09/13/2018 8921 Gross per 24 hour  Intake 612.06 ml  Output --  Net 612.06 ml   Filed Weights   09/07/18 2039  Weight: 81.1 kg    Exam: Patient is examined daily including today on 09/13/2018, exams remain the same as of yesterday except that has changed    General:  NAD  Cardiovascular: RRR  Respiratory: intermittent wheezing right upper lobe  Abdomen: Soft/ND/NT, positive BS  Musculoskeletal: No Edema  Neuro: alert, oriented   Data Reviewed: Basic Metabolic Panel: Recent Labs  Lab 09/07/18 2106  09/09/18 0449 09/10/18 0327 09/11/18 0735 09/12/18 0440 09/13/18 0459  NA  --    < >  139 138 136 139 140  K  --    < > 5.0 3.6 3.2* 4.4 3.2*  CL  --    < > 102 101 106 102 101  CO2  --    < > 28 28 19* 30 31  GLUCOSE  --    < > 118* 118* 80 116* 89  BUN  --    < > 6 <5* 6 6 6   CREATININE 0.52   < > 0.41* 0.41* 0.56 0.40* 0.37*  CALCIUM  --    < > 8.6* 8.4* 8.0* 8.3* 8.3*  MG 2.1   < > 2.4 1.9  1.8 2.2 2.2  PHOS 2.9  --   --   --   --   --   --    < > = values in this interval not displayed.   Liver Function Tests: Recent Labs  Lab 09/07/18 1250 09/09/18 0449 09/10/18 0327 09/12/18 0440 09/13/18 0459  AST 30 20 15  14* 41  ALT 38 23 19 16 23   ALKPHOS 282* 211* 180* 150* 130*  BILITOT 0.3 0.3 0.2* <0.1* 0.2*  PROT 6.6 6.4* 6.0* 5.7* 5.7*  ALBUMIN 2.8* 2.5* 2.5* 2.4* 2.4*   No results for input(s): LIPASE, AMYLASE in the last 168 hours. No results for input(s): AMMONIA in the last 168 hours. CBC: Recent Labs  Lab 09/07/18 1250  09/08/18 0607 09/09/18 0449 09/10/18 0327 09/11/18 0930 09/12/18 0440 09/13/18 0459  WBC 28.9*   < > 13.8* 11.8* 12.7* 9.9 9.9 9.1  NEUTROABS 25.3*  --  10.6*  --   --   --   --   --   HGB 11.7*   < > 10.4* 10.4* 10.5* 10.4* 10.1* 9.8*  HCT 36.9   < > 32.9* 34.7* 34.9* 33.3* 32.4* 33.0*  MCV 93.4   < > 95.9 96.1 95.4 96.0 97.0 97.1  PLT 393   < > 356 387 431* 432* 436* 457*   < > = values in this interval not displayed.   Cardiac Enzymes:   No results for input(s): CKTOTAL, CKMB, CKMBINDEX, TROPONINI in the last 168 hours. BNP (last 3 results) No results for input(s): BNP in the last 8760 hours.  ProBNP (last 3 results) No results for input(s): PROBNP in the last 8760 hours.  CBG: No results for input(s): GLUCAP in the last 168 hours.  Recent Results (from the past 240 hour(s))  SARS Coronavirus 2 (CEPHEID - Performed in [place] hospital lab), Hosp Order     Status: None   Collection Time: 09/07/18  3:56 PM   Specimen: Nasopharyngeal Swab  Result Value Ref Range Status   SARS Coronavirus 2 NEGATIVE NEGATIVE Final    Comment: (NOTE) If result is NEGATIVE SARS-CoV-2 target nucleic acids are NOT DETECTED. The SARS-CoV-2 RNA is generally detectable in upper and lower  respiratory specimens during the acute phase of infection. The lowest  concentration of SARS-CoV-2 viral copies this assay can detect is 250  copies / mL. A  negative result does not preclude SARS-CoV-2 infection  and should not be used as the sole basis for treatment or other  patient management decisions.  A negative result may occur with  improper specimen collection / handling, submission of specimen other  than nasopharyngeal swab, presence of viral mutation(s) within the  areas targeted by this assay, and inadequate number of viral copies  (<250 copies / mL). A negative result must be combined with clinical  observations, patient history, and  epidemiological information. If result is POSITIVE SARS-CoV-2 target nucleic acids are DETECTED. The SARS-CoV-2 RNA is generally detectable in upper and lower  respiratory specimens dur ing the acute phase of infection.  Positive  results are indicative of active infection with SARS-CoV-2.  Clinical  correlation with patient history and other diagnostic information is  necessary to determine patient infection status.  Positive results do  not rule out bacterial infection or co-infection with other viruses. If result is PRESUMPTIVE POSTIVE SARS-CoV-2 nucleic acids MAY BE PRESENT.   A presumptive positive result was obtained on the submitted specimen  and confirmed on repeat testing.  While 2019 novel coronavirus  (SARS-CoV-2) nucleic acids may be present in the submitted sample  additional confirmatory testing may be necessary for epidemiological  and / or clinical management purposes  to differentiate between  SARS-CoV-2 and other Sarbecovirus currently known to infect humans.  If clinically indicated additional testing with an alternate test  methodology [phone]) is advised. The SARS-CoV-2 RNA is generally  detectable in upper and lower respiratory sp ecimens during the acute  phase of infection. The expected result is Negative. Fact Sheet for Patients:  StrictlyIdeas.no Fact Sheet for Healthcare Providers: BankingDealers.co.za This test is not  yet approved or cleared by the [place] FDA and has been authorized for detection and/or diagnosis of SARS-CoV-2 by FDA under an Emergency Use Authorization (EUA).  This EUA will remain in effect (meaning this test can be used) for the duration of the COVID-19 declaration under Section 564(b)(1) of the Act, 21 U.S.C. section 360bbb-3(b)(1), unless the authorization is terminated or revoked sooner. Performed at [facility], [place] [address] [place], [place] [address]   Culture, blood (Routine X 2) w Reflex to ID Panel     Status: None   Collection Time: 09/07/18  5:42 PM   Specimen: BLOOD  Result Value Ref Range Status   Specimen Description   Final    BLOOD RIGHT ANTECUBITAL Performed at [place] [address], [place], [place] [address]    Special Requests   Final    BOTTLES DRAWN AEROBIC AND ANAEROBIC Blood Culture adequate volume Performed at [place] [address] [place], [place] [address]    Culture   Final    NO GROWTH 5 DAYS Performed at [facility], [place] [address], [place], [place] [address]    Report Status 09/12/2018 FINAL  Final  Culture, blood (Routine X 2) w Reflex to ID Panel     Status: None   Collection Time: 09/07/18  5:55 PM   Specimen: BLOOD  Result Value Ref Range Status   Specimen Description   Final    BLOOD RIGHT PORTA CATH CHEST Performed at [place] [address], [place], [place] [address]    Special Requests   Final    BOTTLES DRAWN AEROBIC AND ANAEROBIC Blood Culture adequate volume Performed at [place] [address] [place], [place] [address]    Culture   Final    NO GROWTH 5 DAYS Performed at [facility], [place] [address], [place], [place] [address]    Report Status 09/12/2018 FINAL  Final  Respiratory Panel by PCR     Status: None   Collection Time: 09/07/18 11:01 PM   Specimen: Nasopharyngeal Swab;  Respiratory  Result Value Ref Range Status   Adenovirus NOT DETECTED NOT DETECTED Final   Coronavirus 229E NOT DETECTED NOT DETECTED Final  Comment: (NOTE) The Coronavirus on the Respiratory Panel, DOES NOT test for the novel  Coronavirus (2019 nCoV)    Coronavirus HKU1 NOT DETECTED NOT DETECTED Final   Coronavirus NL63 NOT DETECTED NOT DETECTED Final   Coronavirus OC43 NOT DETECTED NOT DETECTED Final   Metapneumovirus NOT DETECTED NOT DETECTED Final   Rhinovirus / Enterovirus NOT DETECTED NOT DETECTED Final   Influenza A NOT DETECTED NOT DETECTED Final   Influenza B NOT DETECTED NOT DETECTED Final   Parainfluenza Virus 1 NOT DETECTED NOT DETECTED Final   Parainfluenza Virus 2 NOT DETECTED NOT DETECTED Final   Parainfluenza Virus 3 NOT DETECTED NOT DETECTED Final   Parainfluenza Virus 4 NOT DETECTED NOT DETECTED Final   Respiratory Syncytial Virus NOT DETECTED NOT DETECTED Final   Bordetella pertussis NOT DETECTED NOT DETECTED Final   Chlamydophila pneumoniae NOT DETECTED NOT DETECTED Final   Mycoplasma pneumoniae NOT DETECTED NOT DETECTED Final    Comment: Performed at [facility], [place] [address], [place], [place] [address]  MRSA PCR Screening     Status: None   Collection Time: 09/08/18 12:33 AM   Specimen: Nasal Mucosa; Nasopharyngeal  Result Value Ref Range Status   MRSA by PCR NEGATIVE NEGATIVE Final    Comment:        The GeneXpert MRSA Assay (FDA approved for NASAL specimens only), is one component of a comprehensive MRSA colonization surveillance program. It is not intended to diagnose MRSA infection nor to guide or monitor treatment for MRSA infections. Performed at [facility], [place] [address] [place],  [address]   SARS Coronavirus 2 [facility] order, Performed in [facility], [facility], [facility] hospital lab)     Status: None   Collection Time: 09/08/18 10:31 AM   Specimen: Nasopharyngeal Swab  Result Value Ref Range Status   SARS Coronavirus 2  NEGATIVE NEGATIVE Final    Comment: (NOTE) If result is NEGATIVE SARS-CoV-2 target nucleic acids are NOT DETECTED. The SARS-CoV-2 RNA is generally detectable in upper and lower  respiratory specimens during the acute phase of infection. The lowest  concentration of SARS-CoV-2 viral copies this assay can detect is 250  copies / mL. A negative result does not preclude SARS-CoV-2 infection  and should not be used as the sole basis for treatment or other  patient management decisions.  A negative result may occur with  improper specimen collection / handling, submission of specimen other  than nasopharyngeal swab, presence of viral mutation(s) within the  areas targeted by this assay, and inadequate number of viral copies  (<250 copies / mL). A negative result must be combined with clinical  observations, patient history, and epidemiological information. If result is POSITIVE SARS-CoV-2 target nucleic acids are DETECTED. The SARS-CoV-2 RNA is generally detectable in upper and lower  respiratory specimens dur ing the acute phase of infection.  Positive  results are indicative of active infection with SARS-CoV-2.  Clinical  correlation with patient history and other diagnostic information is  necessary to determine patient infection status.  Positive results do  not rule out bacterial infection or co-infection with other viruses. If result is PRESUMPTIVE POSTIVE SARS-CoV-2 nucleic acids MAY BE PRESENT.   A presumptive positive result was obtained on the submitted specimen  and confirmed on repeat testing.  While 2019 novel coronavirus  (SARS-CoV-2) nucleic acids may be present in the submitted sample  additional confirmatory testing may be necessary for epidemiological  and / or clinical management purposes  to differentiate between  SARS-CoV-2  and other Sarbecovirus currently known to infect humans.  If clinically indicated additional testing with an alternate test  methodology [phone])  is advised. The SARS-CoV-2 RNA is generally  detectable in upper and lower respiratory sp ecimens during the acute  phase of infection. The expected result is Negative. Fact Sheet for Patients:  StrictlyIdeas.no Fact Sheet for Healthcare Providers: BankingDealers.co.za This test is not yet approved or cleared by the [place] FDA and has been authorized for detection and/or diagnosis of SARS-CoV-2 by FDA under an Emergency Use Authorization (EUA).  This EUA will remain in effect (meaning this test can be used) for the duration of the COVID-19 declaration under Section 564(b)(1) of the Act, 21 U.S.C. section 360bbb-3(b)(1), unless the authorization is terminated or revoked sooner. Performed at [facility], [place] [address], [place], [place] [address]   MRSA PCR Screening     Status: None   Collection Time: 09/08/18 11:45 AM   Specimen: Nasal Mucosa; Nasopharyngeal  Result Value Ref Range Status   MRSA by PCR NEGATIVE NEGATIVE Final    Comment:        The GeneXpert MRSA Assay (FDA approved for NASAL specimens only), is one component of a comprehensive MRSA colonization surveillance program. It is not intended to diagnose MRSA infection nor to guide or monitor treatment for MRSA infections. Performed at [facility], [place] [address], [place], [place] [address]   Culture, sputum-assessment     Status: None   Collection Time: 09/08/18  8:00 PM   Specimen: Nasopharyngeal Swab; Sputum  Result Value Ref Range Status   Specimen Description EXPECTORATED SPUTUM  Final   Special Requests Immunocompromised  Final   Sputum evaluation   Final    THIS SPECIMEN IS ACCEPTABLE FOR SPUTUM CULTURE Performed at [facility], [place] [address] [place], [place] [address]    Report Status 09/08/2018 FINAL  Final  Culture, respiratory     Status: None   Collection Time: 09/08/18  8:00 PM    Result Value Ref Range Status   Specimen Description   Final    EXPECTORATED SPUTUM Performed at [place] [address], [place], [place] [address]    Special Requests   Final    Immunocompromised Reflexed from [id number] Performed at [facility], [place] [address] [place], [place] [address]    Gram Stain   Final    FEW WBC PRESENT, PREDOMINANTLY PMN NO ORGANISMS SEEN    Culture   Final    Consistent with normal respiratory flora. Performed at [facility], [place] [address] [place],  [address]    Report Status 09/11/2018 FINAL  Final     Studies: No results found.  Scheduled Meds:  chlorhexidine  15 mL Mouth Rinse BID   Chlorhexidine Gluconate Cloth  6 each Topical Daily   dicyclomine  20 mg Oral BID AC   enoxaparin (LOVENOX) injection  40 mg Subcutaneous Q24H   FLUoxetine  80 mg Oral Daily   guaiFENesin  600 mg Oral BID   ipratropium-albuterol  3 mL Nebulization TID   mouth rinse  15 mL Mouth Rinse q12n4p   nicotine  21 mg Transdermal Daily   nystatin  5 mL Mouth/Throat TID   pantoprazole  40 mg Oral Daily   predniSONE  40 mg Oral Q breakfast   sucralfate  1 g Oral TID WC & HS    Continuous Infusions:  piperacillin-tazobactam (ZOSYN)  IV Stopped (09/13/18 1204)  Time spent: 38mins, case discussed with oncology Dr Burr Medico I have personally reviewed and interpreted on  09/13/2018 daily labs, tele strips, imagings as discussed above under date review session and assessment and plans.  I reviewed all nursing notes, pharmacy notes, consultant notes,  vitals, pertinent old records  I have discussed plan of care as described above with RN , patient on 09/13/2018   Florencia Reasons MD, PhD  [facility] Pager [phone]. If 7PM-7AM, please contact night-coverage at [email or web address], password [facility] 09/13/2018, 11:12 AM  LOS: 6 days

## 2018-09-14 ENCOUNTER — Encounter: Payer: Self-pay | Admitting: Hematology

## 2018-09-14 DIAGNOSIS — E876 Hypokalemia: Secondary | ICD-10-CM

## 2018-09-14 DIAGNOSIS — F411 Generalized anxiety disorder: Secondary | ICD-10-CM

## 2018-09-14 LAB — BASIC METABOLIC PANEL
Anion gap: 6 (ref 5–15)
BUN: 6 mg/dL (ref 6–20)
CO2: 31 mmol/L (ref 22–32)
Calcium: 8.2 mg/dL — ABNORMAL LOW (ref 8.9–10.3)
Chloride: 103 mmol/L (ref 98–111)
Creatinine, Ser: 0.41 mg/dL — ABNORMAL LOW (ref 0.44–1.00)
GFR calc Af Amer: >60 mL/min (ref >60)
GFR calc non Af Amer: >60 mL/min (ref >60)
Glucose, Bld: 91 mg/dL (ref 70–99)
Potassium: 2.9 mmol/L — ABNORMAL LOW (ref 3.5–5.1)
Sodium: 140 mmol/L (ref 135–145)

## 2018-09-14 LAB — MAGNESIUM: Magnesium: 2 mg/dL (ref 1.7–2.4)

## 2018-09-14 MED ORDER — GUAIFENESIN ER 600 MG PO TB12: 600.0000 mg | ORAL_TABLET | Freq: Two times a day (BID) | ORAL | 0 refills | Status: AC

## 2018-09-14 MED ORDER — POTASSIUM CHLORIDE CRYS ER 20 MEQ PO TBCR
40.0000 meq | EXTENDED_RELEASE_TABLET | ORAL | Status: AC
  Administered 2018-09-14 (×2): 40 meq via ORAL
  Filled 2018-09-14 (×2): qty 2

## 2018-09-14 MED ORDER — POTASSIUM CHLORIDE CRYS ER 20 MEQ PO TBCR: 20.0000 meq | EXTENDED_RELEASE_TABLET | ORAL | 0 refills | Status: DC

## 2018-09-14 MED ORDER — PREDNISONE 20 MG PO TABS: 40.0000 mg | ORAL_TABLET | Freq: Every day | ORAL | 0 refills | Status: AC

## 2018-09-14 NOTE — Progress Notes (Signed)
Pt referred to [place] to supply home O2 with plan of DC home today.  Sharren Bridge, MSW, LCSW Transitions of Care 09/14/2018 [phone]

## 2018-09-14 NOTE — Progress Notes (Signed)
Pt dc home with her mother. She was dc in a W/C.Home o2 sent with her. She verbalizd understanding od DC orders.

## 2018-09-14 NOTE — Discharge Summary (Signed)
Discharge Summary  Yvonne Lang FFM:[id number] DOB: [birth year]  PCP: Redmond School, MD  Admit date: 09/07/2018 Discharge date: 09/14/2018  Time spent: 39mins, more than 50% time spent on coordination of care.  Recommendations for Outpatient Follow-up:  1. F/u with PCP within a week  for hospital discharge follow up, repeat cbc/bmp at follow up 2. F/u with oncology 3. Home o2   Discharge Diagnoses:  Active Hospital Problems   Diagnosis Date Noted   COPD with acute exacerbation ([place])    Lobar pneumonia ([place])    CAP (community acquired pneumonia) 09/07/2018   Acute hypoxemic respiratory failure ([place]) 04/28/2018   Metastatic non-small cell lung cancer ([place]) 10/06/2017    Resolved Hospital Problems  No resolved problems to display.    Discharge Condition: stable  Diet recommendation: regular diet  Filed Weights   09/07/18 2039  Weight: 81.1 kg    History of present illness: (per admitting MD Dr Ara Kussmaul) Primary Care Physician: Redmond School, MD  Chief Complaint:     Chief Complaint  Patient presents with   Shortness of Breath    HPI: Yvonne Lang is a 53 y.o. female with a known history of COPD, depression/anxiety, GERD, irritable bowel syndrome, lung cancer presents to the emergency department for evaluation of shortness of breath.  Patient was in a usual state of health until this afternoon when she was at her oncologist office for follow-up appointment and chemotherapy when she was noted to have 1 week history of increased shortness of breath, tachycardia, tachypnea and hypoxia.  Her O2 sat was around 91%.  She was recently treated with prednisone for a COPD exacerbation.  Her symptoms are refractory to albuterol treatments at home.  She did not end up receiving her chemotherapy today  Patient denies fevers/chills, weakness, dizziness, chest pain, N/V/C/D, abdominal pain, dysuria/frequency, changes in mental status.    Otherwise there has been  no change in status. Patient has been taking medication as prescribed and there has been no recent change in medication or diet.  No recent antibiotics.  There has been no recent illness, hospitalizations, travel or sick contacts.    EMS/ED Course: Patient received Rocephin. Medical admission has been requested for further management of pneumonia.    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 She finished total of 7 days of iv zosyn in the hospital -appreciate oncology Dr Burr Medico input, discharge home  on prednisone. 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 getting chemotherapy hgb 9.8, monitor  Hypokalemia: replace k   History of depression and anxiety -Continue Prozac -Hold Ativan and Ambien - caution with prn xanax  History ofGERD - ContinueProtonix or Prilosec  Code Status: full  Family Communication: patient  Disposition Plan: home with home o2 on 6/25   Consultants:  oncology  Procedures:  none  Antibiotics:  zosynx7 days   Discharge Exam: BP 121/84 (BP Location: Right Arm)    Pulse 87    Temp 98.7 F (37.1 C) (Oral)     Resp 18    Ht 5\' 7"  (1.702 m)    Wt 81.1 kg    SpO2 99%    BMI 28.00 kg/m   General: NAD Cardiovascular: RRR Respiratory: intermittent scattered wheezing right upper lobe, overall improved aeration   Discharge Instructions You were cared for by a hospitalist during your hospital stay. If you have any questions about your discharge medications or the care you received while you were in the hospital after you are discharged, you can call the unit and asked to speak with the hospitalist on call if the hospitalist that took care of you is not available. Once you are discharged, your primary care physician will handle any further medical issues. Please note that NO REFILLS for any discharge medications will be authorized once you are discharged, as it is imperative that you return to your primary care physician (or establish a relationship with a primary care physician if you do not have one) for your aftercare needs so that they can reassess your need for medications and monitor your lab values.  Discharge Instructions    Diet general   Complete by: As directed    Increase activity slowly   Complete by: As directed      Allergies as of 09/14/2018      Reactions   Codeine Other (See Comments)   Migraine headache      Medication List    STOP taking these medications   sulfamethoxazole-trimethoprim 800-160 MG tablet Commonly known as: BACTRIM DS     TAKE these medications   albuterol 108 (90 Base) MCG/ACT inhaler Commonly known as: Proventil HFA Inhale 2 puffs into the lungs 2 (two) times daily.   ALPRAZolam 0.25 MG tablet Commonly known as: XANAX Take 1 tablet (0.25 mg total) by mouth at bedtime as needed for anxiety or sleep.   butalbital-acetaminophen-caffeine 50-325-40 MG tablet Commonly known as: FIORICET Take 1-2 tablets by mouth every 6 (six) hours as needed for headache.   CALCIUM-MAGNESIUM-ZINC PO Take 1 tablet by mouth daily.   dexamethasone 4 MG tablet Commonly known  as: DECADRON Take 2 tablets (8 mg total) by mouth 2 (two) times daily. Start the day before Taxotere, then take on day after chemo for 2 days   dicyclomine 20 MG tablet Commonly known as: BENTYL Take 1 tablet (20 mg total) by mouth every 6 (six) hours.   FLUoxetine 40 MG capsule Commonly known as: PROZAC Take 80 mg by mouth daily.   furosemide 20 MG tablet Commonly known as: LASIX Take 1 tablet (20 mg total) by mouth daily as needed. What changed: reasons to take this   gabapentin 300 MG capsule Commonly known as: NEURONTIN Take 300 mg by mouth daily as needed (for pain).   guaiFENesin 600 MG 12 hr tablet Commonly known as: MUCINEX Take 1 tablet (600 mg total) by mouth 2 (two) times daily.   HYDROcodone-acetaminophen 10-325 MG tablet Commonly known as: NORCO Take 2 tablets by mouth every 6 (six) hours as needed (For pain.).   ipratropium-albuterol 0.5-2.5 (3) MG/3ML Soln Commonly known as: DUONEB Take 3 mLs by nebulization every 4 (four) hours as needed. What changed: reasons to take this   lidocaine-prilocaine cream Commonly known  as: EMLA APPLY 1 APPLICATION TO PORT TOPICALLY AS NEEDED ONE HOUR PRIOR TO PORT ACCESS/CHEMO What changed: See the new instructions.   magic mouthwash w/lidocaine Soln Take 5 mLs by mouth 3 (three) times daily as needed for mouth pain.   multivitamin tablet Take 1 tablet by mouth daily.   nystatin 100000 UNIT/ML suspension Commonly known as: MYCOSTATIN SWISH ORALLY THEN SPIT WITH 5 ML THREE TIMES DAILY What changed: See the new instructions.   omeprazole 20 MG capsule Commonly known as: PRILOSEC Take 1 capsule (20 mg total) by mouth 2 (two) times daily before a meal.   ondansetron 8 MG tablet Commonly known as: ZOFRAN Take 1 tablet (8 mg total) by mouth every 8 (eight) hours as needed for nausea or vomiting.   potassium chloride SA 20 MEQ tablet Commonly known as: K-DUR Take 1 tablet (20 mEq total) by mouth every Monday,  Wednesday, and Friday. Start taking on: September 15, 2018   predniSONE 20 MG tablet Commonly known as: DELTASONE Take 2 tablets (40 mg total) by mouth daily with breakfast for 14 days. Start taking on: September 15, 2018 What changed:   medication strength  how much to take  how to take this  when to take this  additional instructions   prochlorperazine 10 MG tablet Commonly known as: COMPAZINE Take 1 tablet (10 mg total) by mouth every 6 (six) hours as needed for nausea or vomiting.   rOPINIRole 0.5 MG tablet Commonly known as: REQUIP Take 0.5 mg by mouth at bedtime as needed (For restless legs.).   sucralfate 1 g tablet Commonly known as: CARAFATE TAKE 1 TABLET(1 GRAM) BY MOUTH FOUR TIMES DAILY What changed: See the new instructions.   Trelegy Ellipta 100-62.5-25 MCG/INH Aepb Generic drug: Fluticasone-Umeclidin-Vilant Inhale 1 puff into the lungs daily.   zolpidem 10 MG tablet Commonly known as: AMBIEN Take 10 mg by mouth at bedtime.            Durable Medical Equipment  (From admission, onward)         Start     Ordered   09/14/18 1043  DME Oxygen  Once    Question Answer Comment  Length of Need Lifetime   Mode or (Route) Nasal cannula   Liters per Minute 3   Frequency Continuous (stationary and portable oxygen unit needed)   Oxygen conserving device Yes   Oxygen delivery system Gas      09/14/18 1042   09/13/18 1112  For home use only DME oxygen  Once    Question Answer Comment  Length of Need Lifetime   Mode or (Route) Nasal cannula   Liters per Minute 3   Frequency Continuous (stationary and portable oxygen unit needed)   Oxygen conserving device Yes   Oxygen delivery system Gas      09/13/18 1111         Allergies  Allergen Reactions   Codeine Other (See Comments)    Migraine headache   Follow-up Information    Redmond School, MD Follow up.   Specialty: Internal Medicine Contact information: [address] [place]  [address] [phone]        Truitt Merle, MD Follow up in 1 week(s).   Specialties: Hematology, Oncology Contact information: [place] [address] [phone]            The results of significant diagnostics from this hospitalization (including imaging, microbiology, ancillary and laboratory) are listed below for reference.    Significant  Diagnostic Studies: Dg Chest 2 View  Result Date: 09/07/2018 CLINICAL DATA:  Shortness of breath EXAM: CHEST - 2 VIEW COMPARISON:  08/29/2018, CT 07/21/2018 FINDINGS: Right-sided central venous port tip over the cavoatrial region. Right parahilar architectural distortion with cavitary lesion. Slight increased ground-glass opacity at the periphery. New ground-glass opacity within the lingula and left lung base. Stable heart size. No pneumothorax. IMPRESSION: 1. New ground-glass opacity within the lingula, left base and right upper lobe, suspect for multifocal infection, to include possible atypical or viral pneumonia. 2. Right hilar/perihilar architectural distortion with cavitary lesion as noted on previous CT examinations. Electronically Signed   By: Donavan Foil M.D.   On: 09/07/2018 16:02   Dg Chest 2 View  Result Date: 08/30/2018 CLINICAL DATA:  Shortness of breath, nonproductive cough. Lung cancer. EXAM: CHEST - 2 VIEW COMPARISON:  CT chest 07/21/2018 and chest radiograph 04/30/2018. FINDINGS: Trachea is midline. Heart size normal. Right IJ power port terminates in the low SVC or SVC RA junction. Post treatment parenchymal retraction in the right perihilar region with a focal area of lucency, better evaluated on 07/21/2018. Associated volume loss in the right hemithorax. Biapical pleuroparenchymal scarring, left greater than right. Lungs are otherwise clear. No pleural fluid. IMPRESSION: 1. No acute findings. 2. Post treatment changes in the right hemithorax, better evaluated on 07/21/2018. Electronically Signed   By: Lorin Picket M.D.   On: 08/30/2018 08:36   Ct Angio Chest Pe W/cm &/or Wo Cm  Result Date: 09/07/2018 CLINICAL DATA:  Lung cancer.  Shortness of breath. EXAM: CT ANGIOGRAPHY CHEST WITH CONTRAST TECHNIQUE: Multidetector CT imaging of the chest was performed using the standard protocol during bolus administration of intravenous contrast. Multiplanar CT image reconstructions and MIPs were obtained to evaluate the vascular anatomy. CONTRAST:  195mL OMNIPAQUE IOHEXOL 350 MG/ML SOLN COMPARISON:  CT dated 04/28/2018. CT from Jul 21, 2018 could not be retrieved for comparison. FINDINGS: Cardiovascular: There is a well-positioned right-sided Port-A-Cath. Evaluation for pulmonary emboli is severely limited by motion artifact and suboptimal contrast bolus timing. The main pulmonary artery as well as the left and right pulmonary arteries are dilated. The heart size is normal. There is no significant pericardial effusion. Coronary artery calcifications are noted. Mediastinum/Nodes: Again noted are pathologically enlarged mediastinal lymph nodes. There is a right superior paratracheal lymph node measuring approximately 1.6 cm (previously measuring 1.3 cm. There is an interval increase in size of multiple left AP window lymph nodes when compared to CT from February 2020. There are pathologically enlarged right hilar lymph nodes. Amorphous soft tissues again noted in the right perihilar region. There are no pathologically enlarged axillary lymph nodes. No pathologically enlarged supraclavicular lymph nodes. Lungs/Pleura: There are ground-glass airspace opacities involving the left upper, left lower, and right upper lobes. There is a 4.2 cm cavitary air-filled structure in the right lower lobe. There is improved aeration within the right middle lobe when compared to CT from April 23, 2018. There is a small right-sided pleural effusion. Upper Abdomen: Again noted is a right adrenal mass measuring approximately 3.1 x 3 cm. The left  adrenal gland is only partially visualized and is unremarkable. There is mild intrahepatic biliary ductal dilatation which appears stable from prior studies. There are surgical clips near the GE junction. 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 Musculoskeletal: There is a growing soft tissue nodule in the anterior midline chest measuring approximately 1.5 cm (axial series 4, image 73). There is no acute displaced fracture.  Review of the MIP images confirms the above findings. IMPRESSION: 1. Detection of pulmonary emboli is severely limited by motion artifact and suboptimal contrast bolus timing. Given this limitation, no PE was identified. The pulmonary arteries are dilated which can be seen in patients with elevated pulmonary artery pressures. 2. Diffuse ground-glass airspace opacities involving the right upper lobe, left upper lobe, and left lower lobe. Findings are concerning for multifocal pneumonia (viral or bacterial). 3. Persistent ill-defined soft tissue in the right perihilar region with multiple pathologically enlarged mediastinal and hilar lymph nodes. There is a growing air-filled cavitary area within the right perihilar region which may be related to post treatment changes. 4. Small right-sided pleural effusion. 5. Growing subcutaneous soft tissue nodule in the anterior midline left chest wall as detailed above. This could represent a metastatic deposit. Again noted is a right adrenal metastatic lesion as detailed above. Aortic Atherosclerosis (ICD10-I70.0). Electronically Signed   By: Constance Holster M.D.   On: 09/07/2018 17:08    Microbiology: Recent Results (from the past 240 hour(s))  SARS Coronavirus 2 (CEPHEID - Performed in [facility] hospital lab), Hosp Order     Status: None   Collection Time: 09/07/18  3:56 PM   Specimen: Nasopharyngeal Swab  Result Value Ref Range Status   SARS Coronavirus 2 NEGATIVE NEGATIVE Final    Comment: (NOTE) If result is  NEGATIVE SARS-CoV-2 target nucleic acids are NOT DETECTED. The SARS-CoV-2 RNA is generally detectable in upper and lower  respiratory specimens during the acute phase of infection. The lowest  concentration of SARS-CoV-2 viral copies this assay can detect is 250  copies / mL. A negative result does not preclude SARS-CoV-2 infection  and should not be used as the sole basis for treatment or other  patient management decisions.  A negative result may occur with  improper specimen collection / handling, submission of specimen other  than nasopharyngeal swab, presence of viral mutation(s) within the  areas targeted by this assay, and inadequate number of viral copies  (<250 copies / mL). A negative result must be combined with clinical  observations, patient history, and epidemiological information. If result is POSITIVE SARS-CoV-2 target nucleic acids are DETECTED. The SARS-CoV-2 RNA is generally detectable in upper and lower  respiratory specimens dur ing the acute phase of infection.  Positive  results are indicative of active infection with SARS-CoV-2.  Clinical  correlation with patient history and other diagnostic information is  necessary to determine patient infection status.  Positive results do  not rule out bacterial infection or co-infection with other viruses. If result is PRESUMPTIVE POSTIVE SARS-CoV-2 nucleic acids MAY BE PRESENT.   A presumptive positive result was obtained on the submitted specimen  and confirmed on repeat testing.  While 2019 novel coronavirus  (SARS-CoV-2) nucleic acids may be present in the submitted sample  additional confirmatory testing may be necessary for epidemiological  and / or clinical management purposes  to differentiate between  SARS-CoV-2 and other Sarbecovirus currently known to infect humans.  If clinically indicated additional testing with an alternate test  methodology [phone]) is advised. The SARS-CoV-2 RNA is generally  detectable  in upper and lower respiratory sp ecimens during the acute  phase of infection. The expected result is Negative. Fact Sheet for Patients:  StrictlyIdeas.no Fact Sheet for Healthcare Providers: BankingDealers.co.za This test is not yet approved or cleared by the [place] FDA and has been authorized for detection and/or diagnosis of SARS-CoV-2 by FDA under an Emergency Use Authorization (  EUA).  This EUA will remain in effect (meaning this test can be used) for the duration of the COVID-19 declaration under Section 564(b)(1) of the Act, 21 U.S.C. section 360bbb-3(b)(1), unless the authorization is terminated or revoked sooner. Performed at [facility], [place] [address] [place], [place] [address]   Culture, blood (Routine X 2) w Reflex to ID Panel     Status: None   Collection Time: 09/07/18  5:42 PM   Specimen: BLOOD  Result Value Ref Range Status   Specimen Description   Final    BLOOD RIGHT ANTECUBITAL Performed at [facility] [address] [place], [place] [address]    Special Requests   Final    BOTTLES DRAWN AEROBIC AND ANAEROBIC Blood Culture adequate volume Performed at [facility] [address], [place], [place] [address]    Culture   Final    NO GROWTH 5 DAYS Performed at [facility], [place] [address], [place], [place] [address]    Report Status 09/12/2018 FINAL  Final  Culture, blood (Routine X 2) w Reflex to ID Panel     Status: None   Collection Time: 09/07/18  5:55 PM   Specimen: BLOOD  Result Value Ref Range Status   Specimen Description   Final    BLOOD RIGHT PORTA CATH CHEST Performed at [facility] [address], [place], [place] [address]    Special Requests   Final    BOTTLES DRAWN AEROBIC AND ANAEROBIC Blood Culture adequate volume Performed at [facility] [address] [place], [place]  [address]    Culture   Final    NO GROWTH 5 DAYS Performed at [facility], [place] [address] [place], [place] [address]    Report Status 09/12/2018 FINAL  Final  Respiratory Panel by PCR     Status: None   Collection Time: 09/07/18 11:01 PM   Specimen: Nasopharyngeal Swab; Respiratory  Result Value Ref Range Status   Adenovirus NOT DETECTED NOT DETECTED Final   Coronavirus 229E NOT DETECTED NOT DETECTED Final    Comment: (NOTE) The Coronavirus on the Respiratory Panel, DOES NOT test for the novel  Coronavirus (2019 nCoV)    Coronavirus HKU1 NOT DETECTED NOT DETECTED Final   Coronavirus NL63 NOT DETECTED NOT DETECTED Final   Coronavirus OC43 NOT DETECTED NOT DETECTED Final   Metapneumovirus NOT DETECTED NOT DETECTED Final   Rhinovirus / Enterovirus NOT DETECTED NOT DETECTED Final   Influenza A NOT DETECTED NOT DETECTED Final   Influenza B NOT DETECTED NOT DETECTED Final   Parainfluenza Virus 1 NOT DETECTED NOT DETECTED Final   Parainfluenza Virus 2 NOT DETECTED NOT DETECTED Final   Parainfluenza Virus 3 NOT DETECTED NOT DETECTED Final   Parainfluenza Virus 4 NOT DETECTED NOT DETECTED Final   Respiratory Syncytial Virus NOT DETECTED NOT DETECTED Final   Bordetella pertussis NOT DETECTED NOT DETECTED Final   Chlamydophila pneumoniae NOT DETECTED NOT DETECTED Final   Mycoplasma pneumoniae NOT DETECTED NOT DETECTED Final    Comment: Performed at [facility], [place] [address], [place], [place] [address]  MRSA PCR Screening     Status: None   Collection Time: 09/08/18 12:33 AM   Specimen: Nasal Mucosa; Nasopharyngeal  Result Value Ref Range Status   MRSA by PCR NEGATIVE NEGATIVE Final    Comment:        The GeneXpert MRSA Assay (FDA approved for NASAL specimens only),  is one component of a comprehensive MRSA colonization surveillance program. It is not intended to diagnose MRSA infection nor to guide or monitor treatment for MRSA infections. Performed at [facility], [place] [address], [place], [place] [address]   SARS Coronavirus 2 [facility] order, Performed in [facility] hospital lab)     Status: None   Collection Time: 09/08/18 10:31 AM   Specimen: Nasopharyngeal Swab  Result Value Ref Range Status   SARS Coronavirus 2 NEGATIVE NEGATIVE Final    Comment: (NOTE) If result is NEGATIVE SARS-CoV-2 target nucleic acids are NOT DETECTED. The SARS-CoV-2 RNA is generally detectable in upper and lower  respiratory specimens during the acute phase of infection. The lowest  concentration of SARS-CoV-2 viral copies this assay can detect is 250  copies / mL. A negative result does not preclude SARS-CoV-2 infection  and should not be used as the sole basis for treatment or other  patient management decisions.  A negative result may occur with  improper specimen collection / handling, submission of specimen other  than nasopharyngeal swab, presence of viral mutation(s) within the  areas targeted by this assay, and inadequate number of viral copies  (<250 copies / mL). A negative result must be combined with clinical  observations, patient history, and epidemiological information. If result is POSITIVE SARS-CoV-2 target nucleic acids are DETECTED. The SARS-CoV-2 RNA is generally detectable in upper and lower  respiratory specimens dur ing the acute phase of infection.  Positive  results are indicative of active infection with SARS-CoV-2.  Clinical  correlation with patient history and other diagnostic information is  necessary to determine patient infection status.  Positive results do  not rule out bacterial infection or co-infection with other viruses. If result is PRESUMPTIVE POSTIVE SARS-CoV-2 nucleic acids MAY BE PRESENT.   A presumptive positive result was obtained on the submitted specimen  and confirmed on repeat testing.  While 2019 novel coronavirus  (SARS-CoV-2) nucleic acids may be present in the submitted sample  additional  confirmatory testing may be necessary for epidemiological  and / or clinical management purposes  to differentiate between  SARS-CoV-2 and other Sarbecovirus currently known to infect humans.  If clinically indicated additional testing with an alternate test  methodology [phone]) is advised. The SARS-CoV-2 RNA is generally  detectable in upper and lower respiratory sp ecimens during the acute  phase of infection. The expected result is Negative. Fact Sheet for Patients:  StrictlyIdeas.no Fact Sheet for Healthcare Providers: BankingDealers.co.za This test is not yet approved or cleared by the [place] FDA and has been authorized for detection and/or diagnosis of SARS-CoV-2 by FDA under an Emergency Use Authorization (EUA).  This EUA will remain in effect (meaning this test can be used) for the duration of the COVID-19 declaration under Section 564(b)(1) of the Act, 21 U.S.C. section 360bbb-3(b)(1), unless the authorization is terminated or revoked sooner. Performed at [facility], [place] [address] [address], [place], [place] [address]   MRSA PCR Screening     Status: None   Collection Time: 09/08/18 11:45 AM   Specimen: Nasal Mucosa; Nasopharyngeal  Result Value Ref Range Status   MRSA by PCR NEGATIVE NEGATIVE Final    Comment:        The GeneXpert MRSA Assay (FDA approved for NASAL specimens only), is one component of a comprehensive MRSA colonization surveillance program. It is not intended to diagnose MRSA infection nor to guide or monitor treatment for MRSA infections. Performed at [facility], [place] [address], [place], [place] [address]   Culture, sputum-assessment     Status: None   Collection Time: 09/08/18  8:00 PM   Specimen: Nasopharyngeal Swab; Sputum  Result Value Ref Range Status   Specimen Description EXPECTORATED SPUTUM  Final   Special Requests Immunocompromised  Final   Sputum  evaluation   Final    THIS SPECIMEN IS ACCEPTABLE FOR SPUTUM CULTURE Performed at [facility], [place] [address], [place], [place] [address]    Report Status 09/08/2018 FINAL  Final  Culture, respiratory     Status: None   Collection Time: 09/08/18  8:00 PM  Result Value Ref Range Status   Specimen Description   Final    EXPECTORATED SPUTUM Performed at [place] [address] [place], [place] [address]    Special Requests   Final    Immunocompromised Reflexed from [phone] Performed at [facility], [place] [address], [place], [place] [address]    Gram Stain   Final    FEW WBC PRESENT, PREDOMINANTLY PMN NO ORGANISMS SEEN    Culture   Final    Consistent with normal respiratory flora. Performed at [facility], [place] [address] [place], [place], [place] [address]    Report Status 09/11/2018 FINAL  Final     Labs: Basic Metabolic Panel: Recent Labs  Lab 09/07/18 2106  09/10/18 0327 09/11/18 0735 09/12/18 0440 09/13/18 0459 09/14/18 0416  NA  --    < > 138 136 139 140 140  K  --    < > 3.6 3.2* 4.4 3.2* 2.9*  CL  --    < > 101 106 102 101 103  CO2  --    < > 28 19* 30 31 31   GLUCOSE  --    < > 118* 80 116* 89 91  BUN  --    < > <5* 6 6 6 6   CREATININE 0.52   < > 0.41* 0.56 0.40* 0.37* 0.41*  CALCIUM  --    < > 8.4* 8.0* 8.3* 8.3* 8.2*  MG 2.1   < > 1.9 1.8 2.2 2.2 2.0  PHOS 2.9  --   --   --   --   --   --    < > = values in this interval not displayed.   Liver Function Tests: Recent Labs  Lab 09/09/18 0449 09/10/18 0327 09/12/18 0440 09/13/18 0459  AST 20 15 14* 41  ALT 23 19 16 23   ALKPHOS 211* 180* 150* 130*  BILITOT 0.3 0.2* <0.1* 0.2*  PROT 6.4* 6.0* 5.7* 5.7*  ALBUMIN 2.5* 2.5* 2.4* 2.4*   No results for input(s): LIPASE, AMYLASE in the last 168 hours. No results for input(s): AMMONIA in the last 168 hours. CBC: Recent Labs  Lab 09/08/18 0607 09/09/18 0449 09/10/18 0327 09/11/18 0930  09/12/18 0440 09/13/18 0459  WBC 13.8* 11.8* 12.7* 9.9 9.9 9.1  NEUTROABS 10.6*  --   --   --   --   --   HGB 10.4* 10.4* 10.5* 10.4* 10.1* 9.8*  HCT 32.9* 34.7* 34.9* 33.3* 32.4* 33.0*  MCV 95.9 96.1 95.4 96.0 97.0 97.1  PLT 356 387 431* 432* 436* 457*   Cardiac Enzymes: No results for input(s): CKTOTAL, CKMB, CKMBINDEX, TROPONINI in the last 168 hours. BNP: BNP (last 3 results) No results for input(s): BNP in the last 8760 hours.  ProBNP (last 3 results) No results for input(s): PROBNP in  the last 8760 hours.  CBG: No results for input(s): GLUCAP in the last 168 hours.     Signed:  Florencia Reasons MD, PhD  [facility] 09/14/2018, 7:54 PM

## 2018-09-15 ENCOUNTER — Encounter: Payer: Self-pay | Admitting: Hematology

## 2018-09-16 ENCOUNTER — Other Ambulatory Visit: Payer: Self-pay | Admitting: Nurse Practitioner

## 2018-09-18 ENCOUNTER — Telehealth: Payer: Self-pay | Admitting: Hematology

## 2018-09-18 NOTE — Telephone Encounter (Signed)
Scheduled appt per 6/26 sch message - pt aware of appt date and time

## 2018-09-19 NOTE — Progress Notes (Signed)
Yvonne Lang   [phone] [phone]   Clinic Follow up Note   Patient Care Team: Redmond School, MD as PCP - General (Internal Medicine)   I connected with Yvonne Lang on 09/21/2018 at  1:45 PM EDT by telephone visit and verified that I am speaking with the correct person using two identifiers.  I discussed the limitations, risks, security and privacy concerns of performing an evaluation and management service by telephone and the availability of in person appointments. I also discussed with the patient that there may be a patient responsible charge related to this service. The patient expressed understanding and agreed to proceed.   Other persons participating in the visit and their role in the encounter: Her mother   Patient's location:  Her home  Provider's location:  My Office   CHIEF COMPLAINT: Metastatic non-small cell lung cancer  SUMMARY OF ONCOLOGIC HISTORY: Oncology History Overview Note  Cancer Staging Metastatic non-small cell lung cancer (Yvonne Lang) Staging form: Lung, AJCC 8th Edition - Clinical stage from 10/17/2017: Stage IV (cT3, cN1, cM1b) - Signed by Truitt Merle, MD on 10/17/2017     Metastatic non-small cell lung cancer (Yvonne Lang)  09/20/2017 Imaging   US Breast Left 09/20/17  IMPRESSION Indeterminte palpable mass measuring 3.2x1.7x2.8 cm  inferior medial to the inframammary fold of the left breast.     09/20/2017 Initial Biopsy   Diagnosis 09/20/17 Soft Tissue Needle Core Biopsy, inferior medial to IMF - ADENOCARCINOMA. - SEE MICROSCOPIC DESCRIPTION.  Microscopic Comment Immunohistochemistry will be performed and reported as an addendum. (JDP:ah 09/23/17) ADDENDUM: Immunohistochemistry shows the tumor is strongly positive with cytokeratin AE1/AE3, cytokeratin 7 and shows moderate weak positivity with CDX-2. The tumor is negative with estrogen receptor, progesterone receptor, GCDFP, GATA-3, Napsin A, TTF-1, WT-1 and cytokeratin 20. The  immunophenotype is consistent with metastatic carcinoma. The combination of CDX-2 and cytokeratin 7 positivity raises the possibility of an upper gastrointestinal primary. Clinical correlation is essential.   10/06/2017 Initial Diagnosis   Metastatic adenocarcinoma involving soft tissue with unknown primary site [facility])   10/10/2017 Procedure   Upper Endoscopy by Dr. Silverio Decamp 10/10/17  IMPRESSION - Normal esophagus. - Z-line regular, 35 cm from the incisors. - Gastric bypass with a normal-sized pouch and intact staple line. Gastrojejunal anastomosis characterized by healthy appearing mucosa. - No specimens collected.   10/12/2017 Imaging   CT CAP WO Contrast 10/12/17  IMPRESSION: 7 cm central right lung mass involving the right hilum, with postobstructive collapse of the right middle lobe, highly suspicious for primary bronchogenic carcinoma. 5 cm masslike opacity in the superior segment of the right lower lobe may represent carcinoma or postobstructive pneumonitis. 3.3 cm soft tissue mass in the lower anterior chest wall soft tissues, suspicious for metastatic disease. No evidence of abdominal or pelvic metastatic disease.    10/14/2017 Imaging   MRI Brain 10/14/17  IMPRESSION: 1. No metastatic disease or acute intracranial abnormality. Normal MRI appearance of the brain. 2. 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     10/17/2017 Cancer Staging   Staging form: Lung, AJCC 8th Edition - Clinical stage from 10/17/2017: Stage IV (cT3, cN1, cM1b) - Signed by Truitt Merle, MD on 10/17/2017   10/24/2017 - 11/04/2017 Radiation Therapy    The Right lung mass was treated to 30 Gy in 10 fractions of 3 Gy   11/09/2017 - 03/24/2018 Chemotherapy   -first line chemo with carboplatin, paclitaxel, Atezolizumab and bevacizumab every 3weeks starting 11/08/17.  -Switched to maintenance therapy with  avastin and atezolizumab q3weeks on 03/24/18   01/10/2018 Imaging   01/10/2018 CT CA  IMPRESSION: 1. Response to therapy of central right lower lobe lung mass. Re-expansion of the right middle lobe with significantly improved right lower lobe postobstructive pneumonitis. Residual geographic airspace and ground-glass opacity within the medial right lung, primarily favored to be radiation induced. 2. Near complete resolution of presternal soft tissue mass. 3. New right adrenal nodule since 10/11/2017, suspicious for metastatic disease. 4. Right-sided Port-A-Cath with probable nonocclusive thrombus along the catheter within the right brachiocephalic vein. 5. Age advanced coronary artery atherosclerosis. Recommend assessment of coronary risk factors and consideration of medical therapy. 6. Aortic atherosclerosis (ICD10-I70.0) and emphysema (ICD10-J43.9).   03/10/2018 Imaging   CT CAP W Contrast 03/10/18 IMPRESSION: 1. Radiation changes involving the right paramediastinal lung and right hilum but no findings suspicious for residual or recurrent tumor. 2. No evidence of pulmonary metastatic disease. Stable emphysematous changes and areas of pulmonary scarring. 3. Stable 11 mm right adrenal gland nodule. 4. No findings for metastatic disease involving the liver or bony structures.   03/24/2018 - 04/24/2018 Chemotherapy   The patient had bevacizumab (AVASTIN) 1,300 mg in sodium chloride 0.9 % 100 mL chemo infusion, 15.5 mg/kg = 1,275 mg, Intravenous,  Once, 1 of 4 cycles Administration: 1,300 mg (03/24/2018) atezolizumab (TECENTRIQ) 1,200 mg in sodium chloride 0.9 % 250 mL chemo infusion, 1,200 mg, Intravenous, Once, 1 of 4 cycles Administration: 1,200 mg (03/24/2018)  for chemotherapy treatment.    04/11/2018 Imaging   CT ANGIO CHEST PE W OR WO CONTRAST  IMPRESSION: 1. No evidence of acute pulmonary embolism. 2. Progressive radiation changes in the right perihilar region. Cavitary retro hilar mass and right paratracheal lymph node have enlarged, suspicious for local  recurrence/disease progression. 3. Enlarging right adrenal nodule suspicious for metastatic disease. 4. New right pleural effusion with increased atelectasis at both lung bases. 5. These results will be called to the ordering clinician or representative by the Radiology Department at the imaging location.    04/21/2018 Imaging   NUCLEAR MEDICINE PET SKULL BASE TO THIGH IMPRESSION: 1. Marked hypermetabolism in the amorphous soft tissue involving the right hilum. This represents the central component of the apparent radiation scarring. 2. Hypermetabolic mediastinal nodal metastases with nodal metastases identified in the anterior right juxta diaphragmatic fat. 3. Hypermetabolic right adrenal metastasis. 4. Interval progression of loculated right pleural effusion.    05/02/2018 - 07/25/2018 Chemotherapy   Second line Alimta 500mg /m2 every 3 weeks, started 05/02/2018. D/c due to disease pregression after 4 cycles     07/21/2018 Imaging   CT CAP 07/21/18  IMPRESSION: 1. Enlarging right adrenal metastatic lesion a mildly enlarging mediastinal adenopathy compatible with mild progression of disease. No new metastatic foci are identified. 2. Evolutionary findings along the right perihilar/paramediastinal radiation fibrosis including some improvement in aeration in the right upper lobe, but enlargement of the cavitary/centrally necrotic region in the superior segment right lower lobe, which is gas-filled and which probably connects to the otherwise focally occluded bronchus intermedius. 3. Other imaging findings of potential clinical significance: Probable left anterior descending coronary atherosclerosis. Mild to moderate intrahepatic biliary dilatation mild extrahepatic biliary dilatation, much of which may be a physiologic response to cholecystectomy. Prominent stool throughout the colon favors constipation. Aortic Atherosclerosis (ICD10-I70.0).   07/26/2018 -  Chemotherapy   Third-line  docetaxel and cyramza q3 weeks on 07/26/18   09/07/2018 Imaging   CT Angio Chest 09/07/18  IMPRESSION: 1. Detection of pulmonary emboli is severely  limited by motion artifact and suboptimal contrast bolus timing. Given this limitation, no PE was identified. The pulmonary arteries are dilated which can be seen in patients with elevated pulmonary artery pressures. 2. Diffuse ground-glass airspace opacities involving the right upper lobe, left upper lobe, and left lower lobe. Findings are concerning for multifocal pneumonia (viral or bacterial). 3. Persistent ill-defined soft tissue in the right perihilar region with multiple pathologically enlarged mediastinal and hilar lymph nodes. There is a growing air-filled cavitary area within the right perihilar region which may be related to post treatment changes. 4. Small right-sided pleural effusion. 5. Growing subcutaneous soft tissue nodule in the anterior midline left chest wall as detailed above. This could represent a metastatic deposit. Again noted is a right adrenal metastatic lesion as detailed above.   Aortic Atherosclerosis (ICD10-I70.0).   09/07/2018 - 09/14/2018 Hospital Admission   Admit date: 09/07/18 Admission diagnosis: COPD exacerbation, PNA Additional comments: After 3 cycles of third-line chemo she was admitted for COPD and PNA. She was placed on oxygen and treated with antibotics.       CURRENT THERAPY:  Third-line docetaxel and cyramza q3 weeks on 07/26/18 held after cycle 3 due to PNA and mild disease progression.   INTERVAL HISTORY:  Yvonne Lang is here for a follow up post recent hospitalization. She was able to identify herself by face to face video. She notes since her discharge she stopped mucinex as she is only having dry cough, no production. she is doing breathing treatment TID but still occasionally short winded. She is on 3L oxygen [place]. She notes she can not walk around house much. She has not left the  house since leaving hospital. She does not have home PT, she will forego having someone come to her house. She is still on 40mg  steroids.    REVIEW OF SYSTEMS:   Constitutional: Denies fevers, chills or abnormal weight loss Eyes: Denies blurriness of vision Ears, nose, mouth, throat, and face: Denies mucositis or sore throat Respiratory: (+) Dry cough, dyspnea (+) On 3L oxygen [place]  Cardiovascular: Denies palpitation, chest discomfort or lower extremity swelling Gastrointestinal:  Denies nausea, heartburn or change in bowel habits Skin: Denies abnormal skin rashes Lymphatics: Denies new lymphadenopathy or easy bruising Neurological:Denies numbness, tingling or new weaknesses Behavioral/Psych: Mood is stable, no new changes  All other systems were reviewed with the patient and are negative.  MEDICAL HISTORY:  Past Medical History:  Diagnosis Date  . Anxiety   . Bronchitis   . COPD (chronic obstructive pulmonary disease) ([place])   . Depression   . GERD (gastroesophageal reflux disease)   . H/O gastric bypass    1999  . Irritable bowel syndrome   . Lung cancer [facility])     SURGICAL HISTORY: Past Surgical History:  Procedure Laterality Date  . BACK SURGERY    . CHOLECYSTECTOMY    . COLON SURGERY     for a kink in her colon and 12 inchs removed  . COLONOSCOPY N/A 12/13/2013   Procedure: COLONOSCOPY;  Surgeon: Rogene Houston, MD;  Location: [facility];  Service: Endoscopy;  Laterality: N/A;  200  . ESOPHAGOGASTRODUODENOSCOPY N/A 12/13/2013   Procedure: ESOPHAGOGASTRODUODENOSCOPY (EGD);  Surgeon: Rogene Houston, MD;  Location: [facility];  Service: Endoscopy;  Laterality: N/A;  . Gastric Bypas  2000  . IR IMAGING GUIDED PORT INSERTION  11/14/2017    I have reviewed the social history and family history with the patient and they are unchanged from previous  note.  ALLERGIES:  is allergic to codeine.  MEDICATIONS:  Current Outpatient Medications  Medication Sig Dispense  Refill  . albuterol (PROVENTIL HFA) 108 (90 Base) MCG/ACT inhaler Inhale 2 puffs into the lungs 2 (two) times daily. 1 Inhaler 2  . ALPRAZolam (XANAX) 0.25 MG tablet Take 1 tablet (0.25 mg total) by mouth at bedtime as needed for anxiety or sleep. 30 tablet 0  . butalbital-acetaminophen-caffeine (FIORICET, ESGIC) 50-325-40 MG tablet Take 1-2 tablets by mouth every 6 (six) hours as needed for headache. 60 tablet 1  . CALCIUM-MAGNESIUM-ZINC PO Take 1 tablet by mouth daily.    Marland Kitchen dexamethasone (DECADRON) 4 MG tablet Take 2 tablets (8 mg total) by mouth 2 (two) times daily. Start the day before Taxotere, then take on day after chemo for 2 days 30 tablet 1  . dicyclomine (BENTYL) 20 MG tablet Take 1 tablet (20 mg total) by mouth every 6 (six) hours. 60 tablet 1  . FLUoxetine (PROZAC) 40 MG capsule Take 80 mg by mouth daily.    . furosemide (LASIX) 20 MG tablet Take 1 tablet (20 mg total) by mouth daily as needed. (Patient taking differently: Take 20 mg by mouth daily as needed for fluid or edema. ) 30 tablet 0  . gabapentin (NEURONTIN) 300 MG capsule Take 300 mg by mouth daily as needed (for pain).   4  . guaiFENesin (MUCINEX) 600 MG 12 hr tablet Take 1 tablet (600 mg total) by mouth 2 (two) times daily. 60 tablet 0  . HYDROcodone-acetaminophen (NORCO) 10-325 MG tablet Take 2 tablets by mouth every 6 (six) hours as needed (For pain.).     Marland Kitchen ipratropium-albuterol (DUONEB) 0.5-2.5 (3) MG/3ML SOLN Take 3 mLs by nebulization every 4 (four) hours as needed. (Patient taking differently: Take 3 mLs by nebulization every 4 (four) hours as needed (for shortness of breath). ) 360 mL 1  . lidocaine-prilocaine (EMLA) cream APPLY 1 APPLICATION TO PORT TOPICALLY AS NEEDED ONE HOUR PRIOR TO PORT ACCESS/CHEMO (Patient taking differently: Apply 1 application topically as needed (For port-a-cath.). ) 30 g 0  . magic mouthwash w/lidocaine SOLN Take 5 mLs by mouth 3 (three) times daily as needed for mouth pain. 240 mL 0  .  Multiple Vitamin (MULTIVITAMIN) tablet Take 1 tablet by mouth daily.    Marland Kitchen nystatin (MYCOSTATIN) 100000 UNIT/ML suspension SWISH ORALLY THEN SPIT WITH 5 ML THREE TIMES DAILY (Patient taking differently: Use as directed 5 mLs in the mouth or throat 3 (three) times daily. ) 240 mL 0  . omeprazole (PRILOSEC) 20 MG capsule Take 1 capsule (20 mg total) by mouth 2 (two) times daily before a meal. 60 capsule 0  . ondansetron (ZOFRAN) 8 MG tablet TAKE 1 TABLET BY MOUTH EVERY 8 HOURS AS NEEDED FOR NAUSEA OR VOMITING 30 tablet 0  . potassium chloride SA (K-DUR) 20 MEQ tablet Take 1 tablet (20 mEq total) by mouth every Monday, Wednesday, and Friday. 10 tablet 0  . predniSONE (DELTASONE) 20 MG tablet Take 2 tablets (40 mg total) by mouth daily with breakfast for 14 days. 28 tablet 0  . prochlorperazine (COMPAZINE) 10 MG tablet Take 1 tablet (10 mg total) by mouth every 6 (six) hours as needed for nausea or vomiting. 30 tablet 3  . rOPINIRole (REQUIP) 0.5 MG tablet Take 0.5 mg by mouth at bedtime as needed (For restless legs.).   11  . sucralfate (CARAFATE) 1 g tablet TAKE 1 TABLET(1 GRAM) BY MOUTH FOUR TIMES DAILY (Patient taking differently:  Take 1 g by mouth 4 (four) times daily. ) 120 tablet 0  . TRELEGY ELLIPTA 100-62.5-25 MCG/INH AEPB Inhale 1 puff into the lungs daily.   5  . zolpidem (AMBIEN) 10 MG tablet Take 10 mg by mouth at bedtime.      No current facility-administered medications for this visit.     PHYSICAL EXAMINATION: ECOG PERFORMANCE STATUS: 3 - Symptomatic, >50% confined to bed  No vitals taken today, Exam not performed today   LABORATORY DATA:  I have reviewed the data as listed CBC Latest Ref Rng & Units 09/13/2018 09/12/2018 09/11/2018  WBC 4.0 - 10.5 K/uL 9.1 9.9 9.9  Hemoglobin 12.0 - 15.0 g/dL 9.8(L) 10.1(L) 10.4(L)  Hematocrit 36.0 - 46.0 % 33.0(L) 32.4(L) 33.3(L)  Platelets 150 - 400 K/uL 457(H) 436(H) 432(H)     CMP Latest Ref Rng & Units 09/14/2018 09/13/2018 09/12/2018   Glucose 70 - 99 mg/dL 91 89 116(H)  BUN 6 - 20 mg/dL 6 6 6   Creatinine 0.44 - 1.00 mg/dL 0.41(L) 0.37(L) 0.40(L)  Sodium 135 - 145 mmol/L 140 140 139  Potassium 3.5 - 5.1 mmol/L 2.9(L) 3.2(L) 4.4  Chloride 98 - 111 mmol/L 103 101 102  CO2 22 - 32 mmol/L 31 31 30   Calcium 8.9 - 10.3 mg/dL 8.2(L) 8.3(L) 8.3(L)  Total Protein 6.5 - 8.1 g/dL - 5.7(L) 5.7(L)  Total Bilirubin 0.3 - 1.2 mg/dL - 0.2(L) <0.1(L)  Alkaline Phos 38 - 126 U/L - 130(H) 150(H)  AST 15 - 41 U/L - 41 14(L)  ALT 0 - 44 U/L - 23 16      RADIOGRAPHIC STUDIES: I have personally reviewed the radiological images as listed and agreed with the findings in the report. No results found.   ASSESSMENT & PLAN:  Anjanae Woehrle is a 53 y.o. female with   1. 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 -Diagnosed in 09/2017. Treated with chemo and radiation. She was initially onfirst linecarboplatin, paclitaxel, Atezolizumab and bevacizumab every 3weekss/p 6 cycles, with good partial response.Switched tomaintenance therapy withAvastinandAtezolizumabq3weeksstartedon 03/24/18. -Due to disease progression, chemoswitched tosecond line alimta 500mg /m2 every 3 weekson 05/02/2018, shehas been tolerating well -Based on CT CAP from 07/21/18 she had further disease progression. Second-line treatment was d/c after 4 cycles.  -She started third-line docetaxel and cyramza q3 weeks on 07/26/18. She tolerates moderately well with moderate fatigue and N&V.  -S/p 3 cycles , she had recent COPD exacerbation and PNA in 08/2018. Her CT angio chest during hospital stay showed mild progression of chest LN and soft tissue nodule of anterior midline left chest wall.  -Given mild disease progression and possible relation of docetaxel to her multifocal PNA I will likely change her third-line treatment to fourth-line Gemcitabine if she recovers well. Will discuss this further at next visit.  -Will post pone  further treatment until her performance status and breathing improves. She is agreeable.  -If she does not improve well enough to do chemo, we will discuss palliative care and hospice. Pt understands her cancer is terminal and is realistic  -F/u with her next week virtually to reevaluate   2. Dyspnea and dry cough secondary to her COPD, right lung cancer and radiation changes  -overall stable, she will continue bronchodilator -In 08/2017 she had recent COPD exacerbation and Pneumonia infection. She was treated inpatient with oxygen, antibiotics and steroids.  -She was discharged on 3L oxygen [place] and 40mg  prednisone daily and breathing treatment TID.  -Starting 09/22/18, I will have  her reduce prednisone to 30mg  and continue to very slowly ween her off.  -She still has Dyspnea upon mild activities. Dry cough persists.   3. Frontal Headaches -f/u with Dr. Mickeal Skinner -overall stableand improved  4. Anxiety and Depression  -Currently on Xanax and Cymbalta.Stable  5. Chronic back pain  -Currently on Tramadol 50 mg q6Hr.Stable  6. Low appetite, Nausea -Currently on Mirtazapine, omeprazole, ondansetron,prochlorperazine, and sucralfate. -Nutritional supplement  7. Hypokalemia  -OnKCL 36meq daily  8.Transaminitis -Probably related to chemotherapy, will continue monitoring.  9.Goal of care discussion, DNR/DNI -The patient understands the goal of care is palliative. -We again discussed overall poor prognosis due to the disease progression, she has had multiple lines of chemo, the response from future treatment will be low  -We discussed DNR/DNI when she was recently in hospital, and she agreed DNR/DNI    Plan -She is clinically slowly improving since hospital discharge  -Will reduce Prednisone from 63m to 30mg  daily starting tomorrow -F/u with virtual visit next week and postpone her chemo   No problem-specific Assessment & Plan notes found for this encounter.    No orders of the defined types were placed in this encounter.  I discussed the assessment and treatment plan with the patient. The patient was provided an opportunity to ask questions and all were answered. The patient agreed with the plan and demonstrated an understanding of the instructions.  The patient was advised to call back or seek an in-person evaluation if the symptoms worsen or if the condition fails to improve as anticipated.  I provided 15 minutes of non face-to-face telephone visit time during this encounter, and > 50% was spent counseling as documented under my assessment & plan.    Truitt Merle, MD 09/21/2018   I, Joslyn Devon, am acting as scribe for Truitt Merle, MD.   I have reviewed the above documentation for accuracy and completeness, and I agree with the above.

## 2018-09-20 ENCOUNTER — Telehealth: Payer: Self-pay | Admitting: Hematology

## 2018-09-20 NOTE — Telephone Encounter (Signed)
Confirmed appt and verified info. °

## 2018-09-21 ENCOUNTER — Inpatient Hospital Stay: Payer: BC Managed Care – PPO | Attending: Hematology | Admitting: Hematology

## 2018-09-21 ENCOUNTER — Encounter: Payer: Self-pay | Admitting: Hematology

## 2018-09-21 DIAGNOSIS — J189 Pneumonia, unspecified organism: Secondary | ICD-10-CM

## 2018-09-21 DIAGNOSIS — R51 Headache: Secondary | ICD-10-CM | POA: Insufficient documentation

## 2018-09-21 DIAGNOSIS — C7971 Secondary malignant neoplasm of right adrenal gland: Secondary | ICD-10-CM | POA: Insufficient documentation

## 2018-09-21 DIAGNOSIS — R110 Nausea: Secondary | ICD-10-CM | POA: Insufficient documentation

## 2018-09-21 DIAGNOSIS — J9601 Acute respiratory failure with hypoxia: Secondary | ICD-10-CM | POA: Diagnosis not present

## 2018-09-21 DIAGNOSIS — C3490 Malignant neoplasm of unspecified part of unspecified bronchus or lung: Secondary | ICD-10-CM | POA: Diagnosis not present

## 2018-09-21 DIAGNOSIS — R740 Nonspecific elevation of levels of transaminase and lactic acid dehydrogenase [LDH]: Secondary | ICD-10-CM | POA: Insufficient documentation

## 2018-09-21 DIAGNOSIS — R222 Localized swelling, mass and lump, trunk: Secondary | ICD-10-CM | POA: Insufficient documentation

## 2018-09-21 DIAGNOSIS — Z923 Personal history of irradiation: Secondary | ICD-10-CM | POA: Insufficient documentation

## 2018-09-21 DIAGNOSIS — K219 Gastro-esophageal reflux disease without esophagitis: Secondary | ICD-10-CM | POA: Insufficient documentation

## 2018-09-21 DIAGNOSIS — Z9981 Dependence on supplemental oxygen: Secondary | ICD-10-CM | POA: Insufficient documentation

## 2018-09-21 DIAGNOSIS — C3431 Malignant neoplasm of lower lobe, right bronchus or lung: Secondary | ICD-10-CM | POA: Insufficient documentation

## 2018-09-21 DIAGNOSIS — Z79899 Other long term (current) drug therapy: Secondary | ICD-10-CM | POA: Insufficient documentation

## 2018-09-21 DIAGNOSIS — R05 Cough: Secondary | ICD-10-CM | POA: Insufficient documentation

## 2018-09-21 DIAGNOSIS — M25551 Pain in right hip: Secondary | ICD-10-CM | POA: Insufficient documentation

## 2018-09-21 DIAGNOSIS — G8929 Other chronic pain: Secondary | ICD-10-CM | POA: Insufficient documentation

## 2018-09-21 DIAGNOSIS — F419 Anxiety disorder, unspecified: Secondary | ICD-10-CM | POA: Insufficient documentation

## 2018-09-21 DIAGNOSIS — M545 Low back pain: Secondary | ICD-10-CM | POA: Insufficient documentation

## 2018-09-21 DIAGNOSIS — J441 Chronic obstructive pulmonary disease with (acute) exacerbation: Secondary | ICD-10-CM | POA: Insufficient documentation

## 2018-09-21 DIAGNOSIS — F329 Major depressive disorder, single episode, unspecified: Secondary | ICD-10-CM | POA: Insufficient documentation

## 2018-09-21 DIAGNOSIS — Z9221 Personal history of antineoplastic chemotherapy: Secondary | ICD-10-CM | POA: Insufficient documentation

## 2018-09-21 DIAGNOSIS — Z5111 Encounter for antineoplastic chemotherapy: Secondary | ICD-10-CM | POA: Insufficient documentation

## 2018-09-21 DIAGNOSIS — E876 Hypokalemia: Secondary | ICD-10-CM | POA: Insufficient documentation

## 2018-09-22 ENCOUNTER — Encounter: Payer: Self-pay | Admitting: Hematology

## 2018-09-25 ENCOUNTER — Telehealth: Payer: Self-pay | Admitting: Hematology

## 2018-09-25 NOTE — Telephone Encounter (Signed)
Scheduled appt per 7/2 los. Patient aware of her appt date and time.

## 2018-09-27 DIAGNOSIS — G894 Chronic pain syndrome: Secondary | ICD-10-CM | POA: Diagnosis not present

## 2018-09-27 DIAGNOSIS — F419 Anxiety disorder, unspecified: Secondary | ICD-10-CM | POA: Diagnosis not present

## 2018-09-27 DIAGNOSIS — Z6825 Body mass index (BMI) 25.0-25.9, adult: Secondary | ICD-10-CM | POA: Diagnosis not present

## 2018-09-27 DIAGNOSIS — Z1389 Encounter for screening for other disorder: Secondary | ICD-10-CM | POA: Diagnosis not present

## 2018-09-27 DIAGNOSIS — F330 Major depressive disorder, recurrent, mild: Secondary | ICD-10-CM | POA: Diagnosis not present

## 2018-09-27 NOTE — Progress Notes (Signed)
[place]   [phone] Fax:([phone]) [phone]   Clinic Follow up Note   Patient Care Team: Redmond School, MD as PCP - General (Internal Medicine)   I connected with [name] on 09/28/2018 at  9:45 AM EDT by telephone visit and verified that I am speaking with the correct person using two identifiers.  I discussed the limitations, risks, security and privacy concerns of performing an evaluation and management service by telephone and the availability of in person appointments. I also discussed with the patient that there may be a patient responsible charge related to this service. The patient expressed understanding and agreed to proceed.   Patient's location:  Her home  Provider's location:  My Office   CHIEF COMPLAINT: Metastatic non-small cell lung cancer  SUMMARY OF ONCOLOGIC HISTORY: Oncology History Overview Note  Cancer Staging Metastatic non-small cell lung cancer ([place]) Staging form: Lung, AJCC 8th Edition - Clinical stage from 10/17/2017: Stage IV (cT3, cN1, cM1b) - Signed by Truitt Merle, MD on 10/17/2017     Metastatic non-small cell lung cancer ([place])  09/20/2017 Imaging   US Breast Left 09/20/17  IMPRESSION Indeterminte palpable mass measuring 3.2x1.7x2.8 cm  inferior medial to the inframammary fold of the left breast.     09/20/2017 Initial Biopsy   Diagnosis 09/20/17 Soft Tissue Needle Core Biopsy, inferior medial to IMF - ADENOCARCINOMA. - SEE MICROSCOPIC DESCRIPTION.  Microscopic Comment Immunohistochemistry will be performed and reported as an addendum. (JDP:ah 09/23/17) ADDENDUM: Immunohistochemistry shows the tumor is strongly positive with cytokeratin AE1/AE3, cytokeratin 7 and shows moderate weak positivity with CDX-2. The tumor is negative with estrogen receptor, progesterone receptor, GCDFP, GATA-3, Napsin A, TTF-1, WT-1 and cytokeratin 20. The immunophenotype is consistent with metastatic carcinoma. The combination of CDX-2 and  cytokeratin 7 positivity raises the possibility of an upper gastrointestinal primary. Clinical correlation is essential.   10/06/2017 Initial Diagnosis   Metastatic adenocarcinoma involving soft tissue with unknown primary site [facility])   10/10/2017 Procedure   Upper Endoscopy by Dr. Silverio Decamp 10/10/17  IMPRESSION - Normal esophagus. - Z-line regular, 35 cm from the incisors. - Gastric bypass with a normal-sized pouch and intact staple line. Gastrojejunal anastomosis characterized by healthy appearing mucosa. - No specimens collected.   10/12/2017 Imaging   CT CAP WO Contrast 10/12/17  IMPRESSION: 7 cm central right lung mass involving the right hilum, with postobstructive collapse of the right middle lobe, highly suspicious for primary bronchogenic carcinoma. 5 cm masslike opacity in the superior segment of the right lower lobe may represent carcinoma or postobstructive pneumonitis. 3.3 cm soft tissue mass in the lower anterior chest wall soft tissues, suspicious for metastatic disease. No evidence of abdominal or pelvic metastatic disease.    10/14/2017 Imaging   MRI Brain 10/14/17  IMPRESSION: 1. No metastatic disease or acute intracranial abnormality. Normal MRI appearance of the brain. 2. 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     10/17/2017 Cancer Staging   Staging form: Lung, AJCC 8th Edition - Clinical stage from 10/17/2017: Stage IV (cT3, cN1, cM1b) - Signed by Truitt Merle, MD on 10/17/2017   10/24/2017 - 11/04/2017 Radiation Therapy    The Right lung mass was treated to 30 Gy in 10 fractions of 3 Gy   11/09/2017 - 03/24/2018 Chemotherapy   -first line chemo with carboplatin, paclitaxel, Atezolizumab and bevacizumab every 3weeks starting 11/08/17.  -Switched to maintenance therapy with avastin and atezolizumab q3weeks on 03/24/18   01/10/2018 Imaging   01/10/2018 CT CA IMPRESSION:  1. Response to therapy of central right lower lobe lung mass. Re-expansion of the  right middle lobe with significantly improved right lower lobe postobstructive pneumonitis. Residual geographic airspace and ground-glass opacity within the medial right lung, primarily favored to be radiation induced. 2. Near complete resolution of presternal soft tissue mass. 3. New right adrenal nodule since 10/11/2017, suspicious for metastatic disease. 4. Right-sided Port-A-Cath with probable nonocclusive thrombus along the catheter within the right brachiocephalic vein. 5. Age advanced coronary artery atherosclerosis. Recommend assessment of coronary risk factors and consideration of medical therapy. 6. Aortic atherosclerosis (ICD10-I70.0) and emphysema (ICD10-J43.9).   03/10/2018 Imaging   CT CAP W Contrast 03/10/18 IMPRESSION: 1. Radiation changes involving the right paramediastinal lung and right hilum but no findings suspicious for residual or recurrent tumor. 2. No evidence of pulmonary metastatic disease. Stable emphysematous changes and areas of pulmonary scarring. 3. Stable 11 mm right adrenal gland nodule. 4. No findings for metastatic disease involving the liver or bony structures.   03/24/2018 - 04/24/2018 Chemotherapy   The patient had bevacizumab (AVASTIN) 1,300 mg in sodium chloride 0.9 % 100 mL chemo infusion, 15.5 mg/kg = 1,275 mg, Intravenous,  Once, 1 of 4 cycles Administration: 1,300 mg (03/24/2018) atezolizumab (TECENTRIQ) 1,200 mg in sodium chloride 0.9 % 250 mL chemo infusion, 1,200 mg, Intravenous, Once, 1 of 4 cycles Administration: 1,200 mg (03/24/2018)  for chemotherapy treatment.    04/11/2018 Imaging   CT ANGIO CHEST PE W OR WO CONTRAST  IMPRESSION: 1. No evidence of acute pulmonary embolism. 2. Progressive radiation changes in the right perihilar region. Cavitary retro hilar mass and right paratracheal lymph node have enlarged, suspicious for local recurrence/disease progression. 3. Enlarging right adrenal nodule suspicious for metastatic disease. 4.  New right pleural effusion with increased atelectasis at both lung bases. 5. These results will be called to the ordering clinician or representative by the Radiology Department at the imaging location.    04/21/2018 Imaging   NUCLEAR MEDICINE PET SKULL BASE TO THIGH IMPRESSION: 1. Marked hypermetabolism in the amorphous soft tissue involving the right hilum. This represents the central component of the apparent radiation scarring. 2. Hypermetabolic mediastinal nodal metastases with nodal metastases identified in the anterior right juxta diaphragmatic fat. 3. Hypermetabolic right adrenal metastasis. 4. Interval progression of loculated right pleural effusion.    05/02/2018 - 07/25/2018 Chemotherapy   Second line Alimta 500mg /m2 every 3 weeks, started 05/02/2018. D/c due to disease pregression after 4 cycles     07/21/2018 Imaging   CT CAP 07/21/18  IMPRESSION: 1. Enlarging right adrenal metastatic lesion a mildly enlarging mediastinal adenopathy compatible with mild progression of disease. No new metastatic foci are identified. 2. Evolutionary findings along the right perihilar/paramediastinal radiation fibrosis including some improvement in aeration in the right upper lobe, but enlargement of the cavitary/centrally necrotic region in the superior segment right lower lobe, which is gas-filled and which probably connects to the otherwise focally occluded bronchus intermedius. 3. Other imaging findings of potential clinical significance: Probable left anterior descending coronary atherosclerosis. Mild to moderate intrahepatic biliary dilatation mild extrahepatic biliary dilatation, much of which may be a physiologic response to cholecystectomy. Prominent stool throughout the colon favors constipation. Aortic Atherosclerosis (ICD10-I70.0).   07/26/2018 - 08/17/2018 Chemotherapy   Third-line docetaxel and cyramza q3 weeks on 07/26/18. D/c after cycle 2 due to PNA and mild disease progression    09/07/2018 Imaging   CT Angio Chest 09/07/18  IMPRESSION: 1. Detection of pulmonary emboli is severely limited by motion artifact and  suboptimal contrast bolus timing. Given this limitation, no PE was identified. The pulmonary arteries are dilated which can be seen in patients with elevated pulmonary artery pressures. 2. Diffuse ground-glass airspace opacities involving the right upper lobe, left upper lobe, and left lower lobe. Findings are concerning for multifocal pneumonia (viral or bacterial). 3. Persistent ill-defined soft tissue in the right perihilar region with multiple pathologically enlarged mediastinal and hilar lymph nodes. There is a growing air-filled cavitary area within the right perihilar region which may be related to post treatment changes. 4. Small right-sided pleural effusion. 5. Growing subcutaneous soft tissue nodule in the anterior midline left chest wall as detailed above. This could represent a metastatic deposit. Again noted is a right adrenal metastatic lesion as detailed above.   Aortic Atherosclerosis (ICD10-I70.0).   09/07/2018 - 09/14/2018 Hospital Admission   Admit date: 09/07/18 Admission diagnosis: COPD exacerbation, PNA Additional comments: After 3 cycles of third-line chemo she was admitted for COPD and PNA. She was placed on oxygen and treated with antibotics.     Chemotherapy   Fourth-line Weekly Gemcitabine starting next week       CURRENT THERAPY:  Fourth-line weekly Gemcitabine starting next week   INTERVAL HISTORY:  Cela Newcom is here for a follow up. She was able to identify herself by birth date. She notes she is doing well. She feels little better then last week. She feels mores aches and back pain. She feels her nodule at LN is getting bigger and causing her pain now. She plans to start on the 10/06/18. She is still on 30mg  prednisone. She notes her breathing is stable. She is willing to reduce to 20mg  starting next week.  She  notes she is on 3L oxygen still. Her pulse ox has been in 97-98 range.    REVIEW OF SYSTEMS:   Constitutional: Denies fevers, chills or abnormal weight loss Eyes: Denies blurriness of vision Ears, nose, mouth, throat, and face: Denies mucositis or sore throat Respiratory: Denies cough, dyspnea or wheezes Cardiovascular: Denies palpitation, chest discomfort or lower extremity swelling Gastrointestinal:  Denies nausea, heartburn or change in bowel habits Skin: Denies abnormal skin rashes MSK (+) Body aches and back pain Lymphatics: Denies new lymphadenopathy or easy bruising Neurological:Denies numbness, tingling or new weaknesses Behavioral/Psych: Mood is stable, no new changes  All other systems were reviewed with the patient and are negative.  MEDICAL HISTORY:  Past Medical History:  Diagnosis Date  . Anxiety   . Bronchitis   . COPD (chronic obstructive pulmonary disease) ([place])   . Depression   . GERD (gastroesophageal reflux disease)   . H/O gastric bypass    1999  . Irritable bowel syndrome   . Lung cancer [facility])     SURGICAL HISTORY: Past Surgical History:  Procedure Laterality Date  . BACK SURGERY    . CHOLECYSTECTOMY    . COLON SURGERY     for a kink in her colon and 12 inchs removed  . COLONOSCOPY N/A 12/13/2013   Procedure: COLONOSCOPY;  Surgeon: Rogene Houston, MD;  Location: [facility];  Service: Endoscopy;  Laterality: N/A;  200  . ESOPHAGOGASTRODUODENOSCOPY N/A 12/13/2013   Procedure: ESOPHAGOGASTRODUODENOSCOPY (EGD);  Surgeon: Rogene Houston, MD;  Location: [facility] ENDO SUITE;  Service: Endoscopy;  Laterality: N/A;  . Gastric Bypas  2000  . IR IMAGING GUIDED PORT INSERTION  11/14/2017    I have reviewed the social history and family history with the patient and they are unchanged from previous note.  ALLERGIES:  is allergic to codeine.  MEDICATIONS:  Current Outpatient Medications  Medication Sig Dispense Refill  . albuterol (PROVENTIL HFA) 108 (90 Base)  MCG/ACT inhaler Inhale 2 puffs into the lungs 2 (two) times daily. 1 Inhaler 2  . ALPRAZolam (XANAX) 0.25 MG tablet Take 1 tablet (0.25 mg total) by mouth at bedtime as needed for anxiety or sleep. 30 tablet 0  . butalbital-acetaminophen-caffeine (FIORICET, ESGIC) 50-325-40 MG tablet Take 1-2 tablets by mouth every 6 (six) hours as needed for headache. 60 tablet 1  . CALCIUM-MAGNESIUM-ZINC PO Take 1 tablet by mouth daily.    Marland Kitchen dexamethasone (DECADRON) 4 MG tablet Take 2 tablets (8 mg total) by mouth 2 (two) times daily. Start the day before Taxotere, then take on day after chemo for 2 days 30 tablet 1  . dicyclomine (BENTYL) 20 MG tablet Take 1 tablet (20 mg total) by mouth every 6 (six) hours. 60 tablet 1  . FLUoxetine (PROZAC) 40 MG capsule Take 80 mg by mouth daily.    . furosemide (LASIX) 20 MG tablet Take 1 tablet (20 mg total) by mouth daily as needed. (Patient taking differently: Take 20 mg by mouth daily as needed for fluid or edema. ) 30 tablet 0  . gabapentin (NEURONTIN) 300 MG capsule Take 300 mg by mouth daily as needed (for pain).   4  . guaiFENesin (MUCINEX) 600 MG 12 hr tablet Take 1 tablet (600 mg total) by mouth 2 (two) times daily. 60 tablet 0  . HYDROcodone-acetaminophen (NORCO) 10-325 MG tablet Take 2 tablets by mouth every 6 (six) hours as needed (For pain.).     Marland Kitchen ipratropium-albuterol (DUONEB) 0.5-2.5 (3) MG/3ML SOLN Take 3 mLs by nebulization every 4 (four) hours as needed. (Patient taking differently: Take 3 mLs by nebulization every 4 (four) hours as needed (for shortness of breath). ) 360 mL 1  . lidocaine-prilocaine (EMLA) cream APPLY 1 APPLICATION TO PORT TOPICALLY AS NEEDED ONE HOUR PRIOR TO PORT ACCESS/CHEMO (Patient taking differently: Apply 1 application topically as needed (For port-a-cath.). ) 30 g 0  . magic mouthwash w/lidocaine SOLN Take 5 mLs by mouth 3 (three) times daily as needed for mouth pain. 240 mL 0  . Multiple Vitamin (MULTIVITAMIN) tablet Take 1 tablet  by mouth daily.    Marland Kitchen nystatin (MYCOSTATIN) 100000 UNIT/ML suspension SWISH ORALLY THEN SPIT WITH 5 ML THREE TIMES DAILY (Patient taking differently: Use as directed 5 mLs in the mouth or throat 3 (three) times daily. ) 240 mL 0  . omeprazole (PRILOSEC) 20 MG capsule Take 1 capsule (20 mg total) by mouth 2 (two) times daily before a meal. 60 capsule 0  . ondansetron (ZOFRAN) 8 MG tablet TAKE 1 TABLET BY MOUTH EVERY 8 HOURS AS NEEDED FOR NAUSEA OR VOMITING 30 tablet 0  . potassium chloride SA (K-DUR) 20 MEQ tablet Take 1 tablet (20 mEq total) by mouth every Monday, Wednesday, and Friday. 10 tablet 0  . predniSONE (DELTASONE) 20 MG tablet Take 2 tablets (40 mg total) by mouth daily with breakfast for 14 days. 28 tablet 0  . prochlorperazine (COMPAZINE) 10 MG tablet Take 1 tablet (10 mg total) by mouth every 6 (six) hours as needed for nausea or vomiting. 30 tablet 3  . rOPINIRole (REQUIP) 0.5 MG tablet Take 0.5 mg by mouth at bedtime as needed (For restless legs.).   11  . sucralfate (CARAFATE) 1 g tablet TAKE 1 TABLET(1 GRAM) BY MOUTH FOUR TIMES DAILY (Patient taking differently: Take 1  g by mouth 4 (four) times daily. ) 120 tablet 0  . TRELEGY ELLIPTA 100-62.5-25 MCG/INH AEPB Inhale 1 puff into the lungs daily.   5  . zolpidem (AMBIEN) 10 MG tablet Take 10 mg by mouth at bedtime.      No current facility-administered medications for this visit.     PHYSICAL EXAMINATION: ECOG PERFORMANCE STATUS: 2 - Symptomatic, <50% confined to bed  No vitals taken today, Exam not performed today   LABORATORY DATA:  I have reviewed the data as listed CBC Latest Ref Rng & Units 09/13/2018 09/12/2018 09/11/2018  WBC 4.0 - 10.5 K/uL 9.1 9.9 9.9  Hemoglobin 12.0 - 15.0 g/dL 9.8(L) 10.1(L) 10.4(L)  Hematocrit 36.0 - 46.0 % 33.0(L) 32.4(L) 33.3(L)  Platelets 150 - 400 K/uL 457(H) 436(H) 432(H)     CMP Latest Ref Rng & Units 09/14/2018 09/13/2018 09/12/2018  Glucose 70 - 99 mg/dL 91 89 116(H)  BUN 6 - 20 mg/dL 6 6  6   Creatinine 0.44 - 1.00 mg/dL 0.41(L) 0.37(L) 0.40(L)  Sodium 135 - 145 mmol/L 140 140 139  Potassium 3.5 - 5.1 mmol/L 2.9(L) 3.2(L) 4.4  Chloride 98 - 111 mmol/L 103 101 102  CO2 22 - 32 mmol/L 31 31 30   Calcium 8.9 - 10.3 mg/dL 8.2(L) 8.3(L) 8.3(L)  Total Protein 6.5 - 8.1 g/dL - 5.7(L) 5.7(L)  Total Bilirubin 0.3 - 1.2 mg/dL - 0.2(L) <0.1(L)  Alkaline Phos 38 - 126 U/L - 130(H) 150(H)  AST 15 - 41 U/L - 41 14(L)  ALT 0 - 44 U/L - 23 16      RADIOGRAPHIC STUDIES: I have personally reviewed the radiological images as listed and agreed with the findings in the report. No results found.   ASSESSMENT & PLAN:  Yvonne Lang is a 53 y.o. female with   1. 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 -Diagnosed in 09/2017. Treated with chemo and radiation. She was initially onfirst linecarboplatin, paclitaxel, Atezolizumab and bevacizumab every 3weekss/p 6 cycles, with good partial response.Switched tomaintenance therapy withAvastinandAtezolizumabq3weeksstartedon 03/24/18. -Due to disease progression, chemoswitched tosecond line alimta 500mg /m2 every 3 weekson 05/02/2018, shehas been tolerating well -Based on CT CAP from 07/21/18 she had further disease progression. Second-line treatment was d/c after 4 cycles.  -She started third-line docetaxel and cyramza q3 weeks on 07/26/18. She tolerates moderately well with moderate fatigue and N&V.  -S/p 3 cycles , she had recent COPD exacerbation and PNA in 08/2018. Her CT angio chest during hospital stay showed mild progression of chest LN and soft tissue nodule of anterior midline left chest wall.  -Given mild disease progression and possible relation of docetaxel to her multifocal PNA I will likely change her third-line treatment to weekly Gemcitabine. She is agreeable to start next week. Her overall condition has improved  -Her breathing is improving. Will continue to taper Prednisone and oxygen.  May repeat CT chest in 1 month to monitor PNA.  -F/u in 1 weeks    2.Dyspnea and dry cough secondary to her COPD, right lung cancer and radiation changes  -Overall stable, she will continuebronchodilator -In 08/2017 she had recent COPD exacerbation and Pneumonia infection. She was treated inpatient with oxygen, antibiotics and steroids.  -She was discharged on 3L oxygen [place] and 40mg  prednisone daily and breathing treatment TID.  -Starting 09/22/18, I will have her reduce prednisone to 30mg  and continue to very slowly ween her off.  -She still has Dyspnea upon mild activities. Dry cough persists. -Her breathing is  stable on 30mg  prednisone. Her Pulse Ox has been 97-98%. Will decrease Prednisone to 20mg  starting 10/02/18. I also encouraged her to reduce oxygen to 2L.  -Will repeat CT chest in 10/2018 to monitor PNA.   3. Frontal Headaches -f/u with Dr. Mickeal Skinner -overall stableand improved  4. Anxiety and Depression  -Currently on Xanax and Cymbalta.Stable  5. Chronic back pain  -Currently on Tramadol 50 mg q6Hr.Stable -Also recently has body aches. Will monitor.   6. Low appetite, Nausea -Currently on Mirtazapine, omeprazole, ondansetron,prochlorperazine, and sucralfate. -Nutritional supplement -Improved off chemo  7. Hypokalemia  -OnKCL 23meq daily  8.Transaminitis -Probably related to chemotherapy, will continue monitoring.  9.Goal of care discussion, DNR/DNI -The patient understands the goal of care is palliative. -We previously discussed overall poor prognosis due to the disease progression,she has had multiple lines ofchemo, the response from future treatment will be low -We discussed DNR/DNI when she was recently in hospital, and she agreed DNR/DNI    Plan -She continues to clinically improve and breathing better.  -Will decrease prednisone to 20mg  daily, will continue wean it off in next 2-3 weeks  -Lab, flush, f/u and Gemcitabine next week     No problem-specific Assessment & Plan notes found for this encounter.   No orders of the defined types were placed in this encounter.  I discussed the assessment and treatment plan with the patient. The patient was provided an opportunity to ask questions and all were answered. The patient agreed with the plan and demonstrated an understanding of the instructions.  The patient was advised to call back or seek an in-person evaluation if the symptoms worsen or if the condition fails to improve as anticipated.  I provided 15 minutes of non face-to-face telephone visit time during this encounter, and > 50% was spent counseling as documented under my assessment & plan.    Truitt Merle, MD 09/28/2018   I, Joslyn Devon, am acting as scribe for Truitt Merle, MD.   I have reviewed the above documentation for accuracy and completeness, and I agree with the above.

## 2018-09-28 ENCOUNTER — Inpatient Hospital Stay (HOSPITAL_BASED_OUTPATIENT_CLINIC_OR_DEPARTMENT_OTHER): Payer: BC Managed Care – PPO | Admitting: Hematology

## 2018-09-28 ENCOUNTER — Ambulatory Visit: Payer: BLUE CROSS/BLUE SHIELD

## 2018-09-28 ENCOUNTER — Other Ambulatory Visit: Payer: BLUE CROSS/BLUE SHIELD

## 2018-09-28 ENCOUNTER — Encounter: Payer: Self-pay | Admitting: Hematology

## 2018-09-28 ENCOUNTER — Telehealth: Payer: Self-pay | Admitting: Hematology

## 2018-09-28 DIAGNOSIS — C3490 Malignant neoplasm of unspecified part of unspecified bronchus or lung: Secondary | ICD-10-CM

## 2018-09-28 NOTE — Progress Notes (Signed)
DISCONTINUE ON PATHWAY REGIMEN - Non-Small Cell Lung     A cycle is every 21 days:     Ramucirumab      Docetaxel   **Always confirm dose/schedule in your pharmacy ordering system**  REASON: Toxicities / Adverse Event PRIOR TREATMENT: [id number]: Docetaxel 75 mg/m2 + Ramucirumab 10 mg/kg q21 Days Until Progression or Unacceptable Toxicity TREATMENT RESPONSE: Stable Disease (SD)  START OFF PATHWAY REGIMEN - Non-Small Cell Lung   [id number]:Gemcitabine 1,000 mg/m2 D1, 8  q21 Days:   A cycle is every 21 days:     Gemcitabine   **Always confirm dose/schedule in your pharmacy ordering system**  Patient Characteristics: Stage IV Metastatic, Nonsquamous, Third Line - Chemotherapy/Immunotherapy, PS = 2 AJCC T Category: T3 Current Disease Status: Distant Metastases AJCC N Category: N1 AJCC M Category: M1b AJCC 8 Stage Grouping: IVA Histology: Nonsquamous Cell ROS1 Rearrangement Status: Negative T790M Mutation Status: Not Applicable - EGFR Mutation Negative/Unknown Other Mutations/Biomarkers: No Other Actionable Mutations NTRK Gene Fusion Status: Negative PD-L1 Expression Status: PD-L1 Negative Chemotherapy/Immunotherapy LOT: Third Line Chemotherapy/Immunotherapy Molecular Targeted Therapy: Not Appropriate ALK Rearrangement Status: Negative EGFR Mutation Status: Negative/Wild Type BRAF V600E Mutation Status: Negative ECOG Performance Status: 2 Intent of Therapy: Non-Curative / Palliative Intent, Discussed with Patient

## 2018-09-28 NOTE — Telephone Encounter (Signed)
R/s appt per 7/09 sch message from YF - left message for patient with new appt date and time .

## 2018-09-29 ENCOUNTER — Telehealth: Payer: Self-pay | Admitting: Hematology

## 2018-09-29 ENCOUNTER — Encounter: Payer: Self-pay | Admitting: Hematology

## 2018-09-29 NOTE — Telephone Encounter (Signed)
No los per 7/9.

## 2018-10-02 ENCOUNTER — Encounter: Payer: Self-pay | Admitting: Hematology

## 2018-10-02 ENCOUNTER — Other Ambulatory Visit: Payer: Self-pay

## 2018-10-02 ENCOUNTER — Other Ambulatory Visit: Payer: Self-pay | Admitting: Nurse Practitioner

## 2018-10-02 DIAGNOSIS — J9601 Acute respiratory failure with hypoxia: Secondary | ICD-10-CM

## 2018-10-02 DIAGNOSIS — C3490 Malignant neoplasm of unspecified part of unspecified bronchus or lung: Secondary | ICD-10-CM

## 2018-10-02 MED ORDER — ONDANSETRON HCL 8 MG PO TABS: ORAL_TABLET | ORAL | 1 refills | Status: DC

## 2018-10-02 MED ORDER — PREDNISONE 20 MG PO TABS: 20.0000 mg | ORAL_TABLET | Freq: Every day | ORAL | 0 refills | Status: DC

## 2018-10-02 MED ORDER — ALBUTEROL SULFATE HFA 108 (90 BASE) MCG/ACT IN AERS: 2.0000 | INHALATION_SPRAY | Freq: Two times a day (BID) | RESPIRATORY_TRACT | 1 refills | Status: DC

## 2018-10-02 MED ORDER — DEXAMETHASONE 4 MG PO TABS: 8.0000 mg | ORAL_TABLET | Freq: Two times a day (BID) | ORAL | 1 refills | Status: AC

## 2018-10-05 ENCOUNTER — Inpatient Hospital Stay: Payer: BC Managed Care – PPO

## 2018-10-05 ENCOUNTER — Inpatient Hospital Stay (HOSPITAL_BASED_OUTPATIENT_CLINIC_OR_DEPARTMENT_OTHER): Payer: BC Managed Care – PPO | Admitting: Nurse Practitioner

## 2018-10-05 ENCOUNTER — Encounter: Payer: Self-pay | Admitting: Nurse Practitioner

## 2018-10-05 ENCOUNTER — Telehealth: Payer: Self-pay | Admitting: Nurse Practitioner

## 2018-10-05 ENCOUNTER — Other Ambulatory Visit: Payer: Self-pay

## 2018-10-05 VITALS — BP 122/79 | HR 94 | Temp 98.7°F | Resp 18 | Ht 67.0 in | Wt 178.7 lb

## 2018-10-05 DIAGNOSIS — Z79899 Other long term (current) drug therapy: Secondary | ICD-10-CM | POA: Diagnosis not present

## 2018-10-05 DIAGNOSIS — C3490 Malignant neoplasm of unspecified part of unspecified bronchus or lung: Secondary | ICD-10-CM

## 2018-10-05 DIAGNOSIS — R740 Nonspecific elevation of levels of transaminase and lactic acid dehydrogenase [LDH]: Secondary | ICD-10-CM

## 2018-10-05 DIAGNOSIS — M545 Low back pain: Secondary | ICD-10-CM

## 2018-10-05 DIAGNOSIS — F419 Anxiety disorder, unspecified: Secondary | ICD-10-CM

## 2018-10-05 DIAGNOSIS — G8929 Other chronic pain: Secondary | ICD-10-CM

## 2018-10-05 DIAGNOSIS — E876 Hypokalemia: Secondary | ICD-10-CM

## 2018-10-05 DIAGNOSIS — J441 Chronic obstructive pulmonary disease with (acute) exacerbation: Secondary | ICD-10-CM | POA: Diagnosis not present

## 2018-10-05 DIAGNOSIS — C3431 Malignant neoplasm of lower lobe, right bronchus or lung: Secondary | ICD-10-CM

## 2018-10-05 DIAGNOSIS — F329 Major depressive disorder, single episode, unspecified: Secondary | ICD-10-CM

## 2018-10-05 DIAGNOSIS — C7971 Secondary malignant neoplasm of right adrenal gland: Secondary | ICD-10-CM | POA: Diagnosis not present

## 2018-10-05 DIAGNOSIS — R110 Nausea: Secondary | ICD-10-CM

## 2018-10-05 DIAGNOSIS — M25551 Pain in right hip: Secondary | ICD-10-CM | POA: Diagnosis not present

## 2018-10-05 DIAGNOSIS — Z9221 Personal history of antineoplastic chemotherapy: Secondary | ICD-10-CM

## 2018-10-05 DIAGNOSIS — R51 Headache: Secondary | ICD-10-CM

## 2018-10-05 DIAGNOSIS — K219 Gastro-esophageal reflux disease without esophagitis: Secondary | ICD-10-CM

## 2018-10-05 DIAGNOSIS — Z9981 Dependence on supplemental oxygen: Secondary | ICD-10-CM | POA: Diagnosis not present

## 2018-10-05 DIAGNOSIS — Z95828 Presence of other vascular implants and grafts: Secondary | ICD-10-CM

## 2018-10-05 DIAGNOSIS — Z5111 Encounter for antineoplastic chemotherapy: Secondary | ICD-10-CM | POA: Diagnosis not present

## 2018-10-05 DIAGNOSIS — Z923 Personal history of irradiation: Secondary | ICD-10-CM | POA: Diagnosis not present

## 2018-10-05 DIAGNOSIS — R05 Cough: Secondary | ICD-10-CM | POA: Diagnosis not present

## 2018-10-05 DIAGNOSIS — R222 Localized swelling, mass and lump, trunk: Secondary | ICD-10-CM | POA: Diagnosis not present

## 2018-10-05 DIAGNOSIS — D630 Anemia in neoplastic disease: Secondary | ICD-10-CM

## 2018-10-05 LAB — CBC WITH DIFFERENTIAL (CANCER CENTER ONLY)
Abs Immature Granulocytes: 0.18 10*3/uL — ABNORMAL HIGH (ref 0.00–0.07)
Basophils Absolute: 0 10*3/uL (ref 0.0–0.1)
Basophils Relative: 0 %
Eosinophils Absolute: 0 10*3/uL (ref 0.0–0.5)
Eosinophils Relative: 0 %
HCT: 34.7 % — ABNORMAL LOW (ref 36.0–46.0)
Hemoglobin: 10.6 g/dL — ABNORMAL LOW (ref 12.0–15.0)
Immature Granulocytes: 1 %
Lymphocytes Relative: 5 %
Lymphs Abs: 0.8 10*3/uL (ref 0.7–4.0)
MCH: 29 pg (ref 26.0–34.0)
MCHC: 30.5 g/dL (ref 30.0–36.0)
MCV: 95.1 fL (ref 80.0–100.0)
Monocytes Absolute: 0.8 10*3/uL (ref 0.1–1.0)
Monocytes Relative: 5 %
Neutro Abs: 14.9 10*3/uL — ABNORMAL HIGH (ref 1.7–7.7)
Neutrophils Relative %: 89 %
Platelet Count: 351 10*3/uL (ref 150–400)
RBC: 3.65 MIL/uL — ABNORMAL LOW (ref 3.87–5.11)
RDW: 14.6 % (ref 11.5–15.5)
WBC Count: 16.7 10*3/uL — ABNORMAL HIGH (ref 4.0–10.5)
nRBC: 0 % (ref 0.0–0.2)

## 2018-10-05 LAB — CMP (CANCER CENTER ONLY)
ALT: 34 U/L (ref 0–44)
AST: 19 U/L (ref 15–41)
Albumin: 3.1 g/dL — ABNORMAL LOW (ref 3.5–5.0)
Alkaline Phosphatase: 174 U/L — ABNORMAL HIGH (ref 38–126)
Anion gap: 8 (ref 5–15)
BUN: 9 mg/dL (ref 6–20)
CO2: 25 mmol/L (ref 22–32)
Calcium: 8.2 mg/dL — ABNORMAL LOW (ref 8.9–10.3)
Chloride: 98 mmol/L (ref 98–111)
Creatinine: 0.66 mg/dL (ref 0.44–1.00)
GFR, Est AFR Am: >60 mL/min (ref >60)
GFR, Estimated: >60 mL/min (ref >60)
Glucose, Bld: 103 mg/dL — ABNORMAL HIGH (ref 70–99)
Potassium: 4.5 mmol/L (ref 3.5–5.1)
Sodium: 131 mmol/L — ABNORMAL LOW (ref 135–145)
Total Bilirubin: 0.4 mg/dL (ref 0.3–1.2)
Total Protein: 6.1 g/dL — ABNORMAL LOW (ref 6.5–8.1)

## 2018-10-05 LAB — FERRITIN: Ferritin: 80 ng/mL (ref 11–307)

## 2018-10-05 MED ORDER — HEPARIN SOD (PORK) LOCK FLUSH 100 UNIT/ML IV SOLN
500.0000 [IU] | Freq: Once | INTRAVENOUS | Status: AC | PRN
  Administered 2018-10-05: 500 [IU]
  Filled 2018-10-05: qty 5

## 2018-10-05 MED ORDER — PROCHLORPERAZINE MALEATE 10 MG PO TABS
10.0000 mg | ORAL_TABLET | Freq: Once | ORAL | Status: AC
  Administered 2018-10-05: 10 mg via ORAL

## 2018-10-05 MED ORDER — SODIUM CHLORIDE 0.9 % IV SOLN
1000.0000 mg/m2 | Freq: Once | INTRAVENOUS | Status: AC
  Administered 2018-10-05: 2014 mg via INTRAVENOUS
  Filled 2018-10-05: qty 52.97

## 2018-10-05 MED ORDER — SODIUM CHLORIDE 0.9 % IV SOLN
Freq: Once | INTRAVENOUS | Status: AC
  Administered 2018-10-05: 15:00:00 via INTRAVENOUS
  Filled 2018-10-05: qty 250

## 2018-10-05 MED ORDER — SODIUM CHLORIDE 0.9% FLUSH
10.0000 mL | INTRAVENOUS | Status: DC | PRN
  Administered 2018-10-05: 14:00:00 10 mL
  Filled 2018-10-05: qty 10

## 2018-10-05 MED ORDER — SODIUM CHLORIDE 0.9% FLUSH
10.0000 mL | INTRAVENOUS | Status: DC | PRN
  Administered 2018-10-05: 16:00:00 10 mL
  Filled 2018-10-05: qty 10

## 2018-10-05 MED ORDER — PROCHLORPERAZINE MALEATE 10 MG PO TABS
ORAL_TABLET | ORAL | Status: AC
  Filled 2018-10-05: qty 1

## 2018-10-05 NOTE — Progress Notes (Signed)
[place]   [phone] [phone]   Clinic Follow up Note   Patient Care Team: Redmond School, MD as PCP - General (Internal Medicine) 10/05/2018  CHIEF COMPLAINT: f/u metastatic NSCLC  SUMMARY OF ONCOLOGIC HISTORY: Oncology History Overview Note  Cancer Staging Metastatic non-small cell lung cancer [facility]) Staging form: Lung, AJCC 8th Edition - Clinical stage from 10/17/2017: Stage IV (cT3, cN1, cM1b) - Signed by Truitt Merle, MD on 10/17/2017     Metastatic non-small cell lung cancer ([place])  09/20/2017 Imaging   US Breast Left 09/20/17  IMPRESSION Indeterminte palpable mass measuring 3.2x1.7x2.8 cm  inferior medial to the inframammary fold of the left breast.     09/20/2017 Initial Biopsy   Diagnosis 09/20/17 Soft Tissue Needle Core Biopsy, inferior medial to IMF - ADENOCARCINOMA. - SEE MICROSCOPIC DESCRIPTION.  Microscopic Comment Immunohistochemistry will be performed and reported as an addendum. (JDP:ah 09/23/17) ADDENDUM: Immunohistochemistry shows the tumor is strongly positive with cytokeratin AE1/AE3, cytokeratin 7 and shows moderate weak positivity with CDX-2. The tumor is negative with estrogen receptor, progesterone receptor, GCDFP, GATA-3, Napsin A, TTF-1, WT-1 and cytokeratin 20. The immunophenotype is consistent with metastatic carcinoma. The combination of CDX-2 and cytokeratin 7 positivity raises the possibility of an upper gastrointestinal primary. Clinical correlation is essential.   10/06/2017 Initial Diagnosis   Metastatic adenocarcinoma involving soft tissue with unknown primary site [facility])   10/10/2017 Procedure   Upper Endoscopy by Dr. Silverio Decamp 10/10/17  IMPRESSION - Normal esophagus. - Z-line regular, 35 cm from the incisors. - Gastric bypass with a normal-sized pouch and intact staple line. Gastrojejunal anastomosis characterized by healthy appearing mucosa. - No specimens collected.   10/12/2017 Imaging   CT CAP WO  Contrast 10/12/17  IMPRESSION: 7 cm central right lung mass involving the right hilum, with postobstructive collapse of the right middle lobe, highly suspicious for primary bronchogenic carcinoma. 5 cm masslike opacity in the superior segment of the right lower lobe may represent carcinoma or postobstructive pneumonitis. 3.3 cm soft tissue mass in the lower anterior chest wall soft tissues, suspicious for metastatic disease. No evidence of abdominal or pelvic metastatic disease.    10/14/2017 Imaging   MRI Brain 10/14/17  IMPRESSION: 1. No metastatic disease or acute intracranial abnormality. Normal MRI appearance of the brain. 2. 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     10/17/2017 Cancer Staging   Staging form: Lung, AJCC 8th Edition - Clinical stage from 10/17/2017: Stage IV (cT3, cN1, cM1b) - Signed by Truitt Merle, MD on 10/17/2017   10/24/2017 - 11/04/2017 Radiation Therapy    The Right lung mass was treated to 30 Gy in 10 fractions of 3 Gy   11/09/2017 - 03/24/2018 Chemotherapy   -first line chemo with carboplatin, paclitaxel, Atezolizumab and bevacizumab every 3weeks starting 11/08/17.  -Switched to maintenance therapy with avastin and atezolizumab q3weeks on 03/24/18   01/10/2018 Imaging   01/10/2018 CT CA IMPRESSION: 1. Response to therapy of central right lower lobe lung mass. Re-expansion of the right middle lobe with significantly improved right lower lobe postobstructive pneumonitis. Residual geographic airspace and ground-glass opacity within the medial right lung, primarily favored to be radiation induced. 2. Near complete resolution of presternal soft tissue mass. 3. New right adrenal nodule since 10/11/2017, suspicious for metastatic disease. 4. Right-sided Port-A-Cath with probable nonocclusive thrombus along the catheter within the right brachiocephalic vein. 5. Age advanced coronary artery atherosclerosis. Recommend assessment of coronary risk  factors and consideration of medical therapy. 6. Aortic  atherosclerosis (ICD10-I70.0) and emphysema (ICD10-J43.9).   03/10/2018 Imaging   CT CAP W Contrast 03/10/18 IMPRESSION: 1. Radiation changes involving the right paramediastinal lung and right hilum but no findings suspicious for residual or recurrent tumor. 2. No evidence of pulmonary metastatic disease. Stable emphysematous changes and areas of pulmonary scarring. 3. Stable 11 mm right adrenal gland nodule. 4. No findings for metastatic disease involving the liver or bony structures.   03/24/2018 - 04/24/2018 Chemotherapy   The patient had bevacizumab (AVASTIN) 1,300 mg in sodium chloride 0.9 % 100 mL chemo infusion, 15.5 mg/kg = 1,275 mg, Intravenous,  Once, 1 of 4 cycles Administration: 1,300 mg (03/24/2018) atezolizumab (TECENTRIQ) 1,200 mg in sodium chloride 0.9 % 250 mL chemo infusion, 1,200 mg, Intravenous, Once, 1 of 4 cycles Administration: 1,200 mg (03/24/2018)  for chemotherapy treatment.    04/11/2018 Imaging   CT ANGIO CHEST PE W OR WO CONTRAST  IMPRESSION: 1. No evidence of acute pulmonary embolism. 2. Progressive radiation changes in the right perihilar region. Cavitary retro hilar mass and right paratracheal lymph node have enlarged, suspicious for local recurrence/disease progression. 3. Enlarging right adrenal nodule suspicious for metastatic disease. 4. New right pleural effusion with increased atelectasis at both lung bases. 5. These results will be called to the ordering clinician or representative by the Radiology Department at the imaging location.    04/21/2018 Imaging   NUCLEAR MEDICINE PET SKULL BASE TO THIGH IMPRESSION: 1. Marked hypermetabolism in the amorphous soft tissue involving the right hilum. This represents the central component of the apparent radiation scarring. 2. Hypermetabolic mediastinal nodal metastases with nodal metastases identified in the anterior right juxta diaphragmatic fat.  3. Hypermetabolic right adrenal metastasis. 4. Interval progression of loculated right pleural effusion.    05/02/2018 - 07/25/2018 Chemotherapy   Second line Alimta 513m/m2 every 3 weeks, started 05/02/2018. D/c due to disease pregression after 4 cycles     07/21/2018 Imaging   CT CAP 07/21/18  IMPRESSION: 1. Enlarging right adrenal metastatic lesion a mildly enlarging mediastinal adenopathy compatible with mild progression of disease. No new metastatic foci are identified. 2. Evolutionary findings along the right perihilar/paramediastinal radiation fibrosis including some improvement in aeration in the right upper lobe, but enlargement of the cavitary/centrally necrotic region in the superior segment right lower lobe, which is gas-filled and which probably connects to the otherwise focally occluded bronchus intermedius. 3. Other imaging findings of potential clinical significance: Probable left anterior descending coronary atherosclerosis. Mild to moderate intrahepatic biliary dilatation mild extrahepatic biliary dilatation, much of which may be a physiologic response to cholecystectomy. Prominent stool throughout the colon favors constipation. Aortic Atherosclerosis (ICD10-I70.0).   07/26/2018 - 08/17/2018 Chemotherapy   Third-line docetaxel and cyramza q3 weeks on 07/26/18. D/c after cycle 2 due to PNA and mild disease progression   09/07/2018 Imaging   CT Angio Chest 09/07/18  IMPRESSION: 1. Detection of pulmonary emboli is severely limited by motion artifact and suboptimal contrast bolus timing. Given this limitation, no PE was identified. The pulmonary arteries are dilated which can be seen in patients with elevated pulmonary artery pressures. 2. Diffuse ground-glass airspace opacities involving the right upper lobe, left upper lobe, and left lower lobe. Findings are concerning for multifocal pneumonia (viral or bacterial). 3. Persistent ill-defined soft tissue in the right  perihilar region with multiple pathologically enlarged mediastinal and hilar lymph nodes. There is a growing air-filled cavitary area within the right perihilar region which may be related to post treatment changes. 4. Small right-sided pleural effusion.  5. Growing subcutaneous soft tissue nodule in the anterior midline left chest wall as detailed above. This could represent a metastatic deposit. Again noted is a right adrenal metastatic lesion as detailed above.   Aortic Atherosclerosis (ICD10-I70.0).   09/07/2018 - 09/14/2018 Hospital Admission   Admit date: 09/07/18 Admission diagnosis: COPD exacerbation, PNA Additional comments: After 3 cycles of third-line chemo she was admitted for COPD and PNA. She was placed on oxygen and treated with antibotics.     Chemotherapy   Fourth-line Weekly Gemcitabine starting next week    10/05/2018 -  Chemotherapy   The patient had gemcitabine (GEMZAR) 2,014 mg in sodium chloride 0.9 % 250 mL chemo infusion, 1,000 mg/m2 = 2,014 mg, Intravenous,  Once, 1 of 4 cycles Administration: 2,014 mg (10/05/2018)  for chemotherapy treatment.      CURRENT THERAPY: Fourth line Gemcitabine day 1 and day 8 q21 days, starting 10/05/18   INTERVAL HISTORY: Ms. Mezo returns for f/u and cycle 1 chemo as scheduled. Her respiratory status continues to improve since hospitalization but continues to have intermittent dry cough and dyspnea on exertion. She wheezes occasionally. Using 2 liters O2 continuously. She weaned to 20 mg prednisone a few days ago. Denies chest pain. She can do ADLs with frequent rest periods. Headaches are better. She has chronic low back pain, controlled with 2 tabs norco q6h. She developed new right posterior hip pain one week ago. Denies injury or fall. Standing for long periods exacerbates pain, sitting helps. Pain does not radiate. Norco also helps this. Denies neuropathy. Denies n/v/c/d.    MEDICAL HISTORY:  Past Medical History:  Diagnosis  Date  . Anxiety   . Bronchitis   . COPD (chronic obstructive pulmonary disease) ([place])   . Depression   . GERD (gastroesophageal reflux disease)   . H/O gastric bypass    1999  . Irritable bowel syndrome   . Lung cancer [facility])     SURGICAL HISTORY: Past Surgical History:  Procedure Laterality Date  . BACK SURGERY    . CHOLECYSTECTOMY    . COLON SURGERY     for a kink in her colon and 12 inchs removed  . COLONOSCOPY N/A 12/13/2013   Procedure: COLONOSCOPY;  Surgeon: Rogene Houston, MD;  Location: [facility] ENDO [facility];  Service: Endoscopy;  Laterality: N/A;  200  . ESOPHAGOGASTRODUODENOSCOPY N/A 12/13/2013   Procedure: ESOPHAGOGASTRODUODENOSCOPY (EGD);  Surgeon: Rogene Houston, MD;  Location: [facility] ENDO SUITE;  Service: Endoscopy;  Laterality: N/A;  . Gastric Bypas  2000  . IR IMAGING GUIDED PORT INSERTION  11/14/2017    I have reviewed the social history and family history with the patient and they are unchanged from previous note.  ALLERGIES:  is allergic to codeine.  MEDICATIONS:  Current Outpatient Medications  Medication Sig Dispense Refill  . albuterol (PROVENTIL HFA) 108 (90 Base) MCG/ACT inhaler Inhale 2 puffs into the lungs 2 (two) times daily. 0.006 g 1  . ALPRAZolam (XANAX) 0.25 MG tablet Take 1 tablet (0.25 mg total) by mouth at bedtime as needed for anxiety or sleep. 30 tablet 0  . butalbital-acetaminophen-caffeine (FIORICET, ESGIC) 50-325-40 MG tablet Take 1-2 tablets by mouth every 6 (six) hours as needed for headache. 60 tablet 1  . CALCIUM-MAGNESIUM-ZINC PO Take 1 tablet by mouth daily.    Marland Kitchen dicyclomine (BENTYL) 20 MG tablet Take 1 tablet (20 mg total) by mouth every 6 (six) hours. 60 tablet 1  . FLUoxetine (PROZAC) 40 MG capsule Take 80 mg by mouth  daily.    . gabapentin (NEURONTIN) 300 MG capsule Take 300 mg by mouth daily as needed (for pain).   4  . guaiFENesin (MUCINEX) 600 MG 12 hr tablet Take 1 tablet (600 mg total) by mouth 2 (two) times daily. 60 tablet 0  .  HYDROcodone-acetaminophen (NORCO) 10-325 MG tablet Take 2 tablets by mouth every 6 (six) hours as needed (For pain.).     Marland Kitchen ipratropium-albuterol (DUONEB) 0.5-2.5 (3) MG/3ML SOLN Take 3 mLs by nebulization every 4 (four) hours as needed. (Patient taking differently: Take 3 mLs by nebulization every 4 (four) hours as needed (for shortness of breath). ) 360 mL 1  . lidocaine-prilocaine (EMLA) cream APPLY 1 APPLICATION TO PORT TOPICALLY AS NEEDED ONE HOUR PRIOR TO PORT ACCESS/CHEMO (Patient taking differently: Apply 1 application topically as needed (For port-a-cath.). ) 30 g 0  . magic mouthwash w/lidocaine SOLN Take 5 mLs by mouth 3 (three) times daily as needed for mouth pain. 240 mL 0  . Multiple Vitamin (MULTIVITAMIN) tablet Take 1 tablet by mouth daily.    Marland Kitchen nystatin (MYCOSTATIN) 100000 UNIT/ML suspension SWISH ORALLY THEN SPIT WITH 5 ML THREE TIMES DAILY (Patient taking differently: Use as directed 5 mLs in the mouth or throat 3 (three) times daily. ) 240 mL 0  . omeprazole (PRILOSEC) 20 MG capsule Take 1 capsule (20 mg total) by mouth 2 (two) times daily before a meal. 60 capsule 0  . ondansetron (ZOFRAN) 8 MG tablet TAKE 1 TABLET BY MOUTH EVERY 8 HOURS AS NEEDED FOR NAUSEA OR VOMITING 30 tablet 1  . potassium chloride SA (K-DUR) 20 MEQ tablet Take 1 tablet (20 mEq total) by mouth every Monday, Wednesday, and Friday. 10 tablet 0  . predniSONE (DELTASONE) 20 MG tablet Take 1 tablet (20 mg total) by mouth daily with breakfast. 30 tablet 0  . prochlorperazine (COMPAZINE) 10 MG tablet Take 1 tablet (10 mg total) by mouth every 6 (six) hours as needed for nausea or vomiting. 30 tablet 3  . rOPINIRole (REQUIP) 0.5 MG tablet Take 0.5 mg by mouth at bedtime as needed (For restless legs.).   11  . sucralfate (CARAFATE) 1 g tablet TAKE 1 TABLET(1 GRAM) BY MOUTH FOUR TIMES DAILY (Patient taking differently: Take 1 g by mouth 4 (four) times daily. ) 120 tablet 0  . TRELEGY ELLIPTA 100-62.5-25 MCG/INH AEPB  Inhale 1 puff into the lungs daily.   5  . zolpidem (AMBIEN) 10 MG tablet Take 10 mg by mouth at bedtime.     Marland Kitchen dexamethasone (DECADRON) 4 MG tablet Take 2 tablets (8 mg total) by mouth 2 (two) times daily. Start the day before Taxotere, then take on day after chemo for 2 days 30 tablet 1  . furosemide (LASIX) 20 MG tablet Take 1 tablet (20 mg total) by mouth daily as needed. (Patient taking differently: Take 20 mg by mouth daily as needed for fluid or edema. ) 30 tablet 0   No current facility-administered medications for this visit.    Facility-Administered Medications Ordered in Other Visits  Medication Dose Route Frequency Provider Last Rate Last Dose  . sodium chloride flush (NS) 0.9 % injection 10 mL  10 mL Intracatheter PRN Truitt Merle, MD   10 mL at 10/05/18 1612    PHYSICAL EXAMINATION: ECOG PERFORMANCE STATUS: 2 - Symptomatic, <50% confined to bed  Vitals:   10/05/18 1357  BP: 122/79  Pulse: 94  Resp: 18  Temp: 98.7 F (37.1 C)  SpO2: 99%  Filed Weights   10/05/18 1357  Weight: 178 lb 11.2 oz (81.1 kg)    GENERAL:alert, no distress and comfortable SKIN: no rashes  EYES:  sclera clear LUNGS: mild wheezes. Normal respiratory effort  CHEST: 2 cm left sub-sternal nodule HEART: regular rate & rhythm, no lower extremity edema ABDOMEN:abdomen flat Musculoskeletal:no cyanosis of digits. No hip tenderness to palpation  NEURO: alert & oriented x 3 with fluent speech PAC without erythema  Limited exam for covid19 outbreak   LABORATORY DATA:  I have reviewed the data as listed CBC Latest Ref Rng & Units 10/05/2018 09/13/2018 09/12/2018  WBC 4.0 - 10.5 K/uL 16.7(H) 9.1 9.9  Hemoglobin 12.0 - 15.0 g/dL 10.6(L) 9.8(L) 10.1(L)  Hematocrit 36.0 - 46.0 % 34.7(L) 33.0(L) 32.4(L)  Platelets 150 - 400 K/uL 351 457(H) 436(H)     CMP Latest Ref Rng & Units 10/05/2018 09/14/2018 09/13/2018  Glucose 70 - 99 mg/dL 103(H) 91 89  BUN 6 - 20 mg/dL '9 6 6  ' Creatinine 0.44 - 1.00 mg/dL 0.66  0.41(L) 0.37(L)  Sodium 135 - 145 mmol/L 131(L) 140 140  Potassium 3.5 - 5.1 mmol/L 4.5 2.9(L) 3.2(L)  Chloride 98 - 111 mmol/L 98 103 101  CO2 22 - 32 mmol/L '25 31 31  ' Calcium 8.9 - 10.3 mg/dL 8.2(L) 8.2(L) 8.3(L)  Total Protein 6.5 - 8.1 g/dL 6.1(L) - 5.7(L)  Total Bilirubin 0.3 - 1.2 mg/dL 0.4 - 0.2(L)  Alkaline Phos 38 - 126 U/L 174(H) - 130(H)  AST 15 - 41 U/L 19 - 41  ALT 0 - 44 U/L 34 - 23      RADIOGRAPHIC STUDIES: I have personally reviewed the radiological images as listed and agreed with the findings in the report. No results found.   ASSESSMENT & PLAN: Stephaney Steven a 53 y.o.femalewith   2. 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 -Diagnosed in 09/2017. Treated with chemo and radiation. She was initially onfirst linecarboplatin, paclitaxel, Atezolizumab and bevacizumab every 3weekss/p 6 cycles, with good partial response.Switched tomaintenance therapy withAvastinandAtezolizumabq3weeksstartedon 03/24/18. -Due to disease progression, chemoswitched tosecond line alimta 591m/m2 every 3 weekson 05/02/2018, shehas been tolerating well -CT CAP 5/1/20showing disease progression, Dr. FBurr Medicorecommended changing to third line treatmentdocetaxelandCyramza every 3 weeks; the goal is palliative -S/p cycle 2 on 08/17/18 -CTA noted disease progression, 3rd line treatment was discontinued -she will begin fourth line gemcitabine day 1 and day 8 q21 days, starting 09/25/18  2. Acute hypoxic respiratory illness and PNA requiring hospitalization from 09/07/18 - 09/14/18  3.Dyspnea and dry cough-secondary to her COPD, right lung cancer and radiation changescontinuebronchodilator 4. Frontal Headaches- improved, followed by Dr. VMickeal Skinner5. Anxiety and Depression -Currently on Xanax andProzac; mood improved after cymbalta was switched to prozac. 6. Chronic back pain-previously on tramadol, better controlled on norco 2 tabs  q6Hr.Stable overall 7. Low appetite, Nausea-Currently on Mirtazapine, omeprazole, ondansetron,prochlorperazine, and sucralfate. 8. Hypokalemia- on K replacement486m daily 9.Transaminitis- likely related to chemo, stable  10.Goal of care discussion- full code  11. Right posterior hip pain  Dispo: Ms. WiGrandmaisonppears stable. Her respiratory status is improving. She continues supplemental O2 at 2 liters and prednisone 20 mg daily. Plan to wean over next couple of weeks. If she remains stable, she will start taking 10 mg daily on 10/09/18.    She developed new posterior right hip pain over the last week. Controlled with Norco. Could be musculoskeletal pain related to her chronic low back pain and/or deconditioning after hospitalization. She has been off  chemo for almost 2 months, partly due to her acute hypoxic respiratory illness last month; her soft tissue sub-sternal nodule has enlarged. Will obtain CT [facility] to r/o osseous metastasis and for new baseline as she begins fourth line gemcitabine. I reviewed the plan with Dr. Burr Medico. Patient agrees.  Labs reviewed, CBC and CMP adequate for treatment. WBC elevated likely from steroids. Proceed with cycle 1 day 1 gemcitabine today. She will return for f/u and day 8 next week.    Orders Placed This Encounter  Procedures  . CT Abdomen Pelvis W Contrast    Standing Status:   Future    Standing Expiration Date:   10/05/2019    Order Specific Question:   ** REASON FOR EXAM (FREE TEXT)    Answer:   restage, beginning fourth line chemo, new right hip pain r/o osseous met    Order Specific Question:   If indicated for the ordered procedure, I authorize the administration of contrast media per Radiology protocol    Answer:   Yes    Order Specific Question:   Is patient pregnant?    Answer:   No    Order Specific Question:   Preferred imaging location?    Answer:   [facility]    Order Specific Question:   Is Oral Contrast requested for  this exam?    Answer:   Yes, Per Radiology protocol    Order Specific Question:   Radiology Contrast Protocol - do NOT remove file path    Answer:   [email or web address]   All questions were answered. The patient knows to call the clinic with any problems, questions or concerns. No barriers to learning was detected.     Alla Feeling, NP 10/05/18

## 2018-10-05 NOTE — Telephone Encounter (Signed)
Scheduled appt per 7/16 los.

## 2018-10-05 NOTE — Patient Instructions (Signed)
[facility] Discharge Instructions for Patients Receiving Chemotherapy  Today you received the following chemotherapy agents Gemcitabine   To help prevent nausea and vomiting after your treatment, we encourage you to take your nausea medication as prescribed.   If you develop nausea and vomiting that is not controlled by your nausea medication, call the clinic.   BELOW ARE SYMPTOMS THAT SHOULD BE REPORTED IMMEDIATELY:  *FEVER GREATER THAN 100.5 F  *CHILLS WITH OR WITHOUT FEVER  NAUSEA AND VOMITING THAT IS NOT CONTROLLED WITH YOUR NAUSEA MEDICATION  *UNUSUAL SHORTNESS OF BREATH  *UNUSUAL BRUISING OR BLEEDING  TENDERNESS IN MOUTH AND THROAT WITH OR WITHOUT PRESENCE OF ULCERS  *URINARY PROBLEMS  *BOWEL PROBLEMS  UNUSUAL RASH Items with * indicate a potential emergency and should be followed up as soon as possible.  Feel free to call the clinic should you have any questions or concerns. The clinic phone number is [phone].  Please show the [facility] at check-in to the Emergency Department and triage nurse.  Gemcitabine injection What is this medicine? GEMCITABINE (jem SYE ta been) is a chemotherapy drug. This medicine is used to treat many types of cancer like breast cancer, lung cancer, pancreatic cancer, and ovarian cancer. This medicine may be used for other purposes; ask your health care provider or pharmacist if you have questions. COMMON BRAND NAME(S): Gemzar, Infugem What should I tell my health care provider before I take this medicine? They need to know if you have any of these conditions:  blood disorders  infection  kidney disease  liver disease  lung or breathing disease, like asthma  recent or ongoing radiation therapy  an unusual or allergic reaction to gemcitabine, other chemotherapy, other medicines, foods, dyes, or preservatives  pregnant or trying to get pregnant  breast-feeding How should I use this medicine? This  drug is given as an infusion into a vein. It is administered in a hospital or clinic by a specially trained health care professional. Talk to your pediatrician regarding the use of this medicine in children. Special care may be needed. Overdosage: If you think you have taken too much of this medicine contact a poison control center or emergency room at once. NOTE: This medicine is only for you. Do not share this medicine with others. What if I miss a dose? It is important not to miss your dose. Call your doctor or health care professional if you are unable to keep an appointment. What may interact with this medicine?  medicines to increase blood counts like filgrastim, pegfilgrastim, sargramostim  some other chemotherapy drugs like cisplatin  vaccines Talk to your doctor or health care professional before taking any of these medicines:  acetaminophen  aspirin  ibuprofen  ketoprofen  naproxen This list may not describe all possible interactions. Give your health care provider a list of all the medicines, herbs, non-prescription drugs, or dietary supplements you use. Also tell them if you smoke, drink alcohol, or use illegal drugs. Some items may interact with your medicine. What should I watch for while using this medicine? Visit your doctor for checks on your progress. This drug may make you feel generally unwell. This is not uncommon, as chemotherapy can affect healthy cells as well as cancer cells. Report any side effects. Continue your course of treatment even though you feel ill unless your doctor tells you to stop. In some cases, you may be given additional medicines to help with side effects. Follow all directions for their use.  Call your doctor or health care professional for advice if you get a fever, chills or sore throat, or other symptoms of a cold or flu. Do not treat yourself. This drug decreases your body's ability to fight infections. Try to avoid being around people who  are sick. This medicine may increase your risk to bruise or bleed. Call your doctor or health care professional if you notice any unusual bleeding. Be careful brushing and flossing your teeth or using a toothpick because you may get an infection or bleed more easily. If you have any dental work done, tell your dentist you are receiving this medicine. Avoid taking products that contain aspirin, acetaminophen, ibuprofen, naproxen, or ketoprofen unless instructed by your doctor. These medicines may hide a fever. Do not become pregnant while taking this medicine or for 6 months after stopping it. Women should inform their doctor if they wish to become pregnant or think they might be pregnant. Men should not father a child while taking this medicine and for 3 months after stopping it. There is a potential for serious side effects to an unborn child. Talk to your health care professional or pharmacist for more information. Do not breast-feed an infant while taking this medicine or for at least 1 week after stopping it. Men should inform their doctors if they wish to father a child. This medicine may lower sperm counts. Talk with your doctor or health care professional if you are concerned about your fertility. What side effects may I notice from receiving this medicine? Side effects that you should report to your doctor or health care professional as soon as possible:  allergic reactions like skin rash, itching or hives, swelling of the face, lips, or tongue  breathing problems  pain, redness, or irritation at site where injected  signs and symptoms of a dangerous change in heartbeat or heart rhythm like chest pain; dizziness; fast or irregular heartbeat; palpitations; feeling faint or lightheaded, falls; breathing problems  signs of decreased platelets or bleeding - bruising, pinpoint red spots on the skin, black, tarry stools, blood in the urine  signs of decreased red blood cells - unusually weak or  tired, feeling faint or lightheaded, falls  signs of infection - fever or chills, cough, sore throat, pain or difficulty passing urine  signs and symptoms of kidney injury like trouble passing urine or change in the amount of urine  signs and symptoms of liver injury like dark yellow or brown urine; general ill feeling or flu-like symptoms; light-colored stools; loss of appetite; nausea; right upper belly pain; unusually weak or tired; yellowing of the eyes or skin  swelling of ankles, feet, hands Side effects that usually do not require medical attention (report to your doctor or health care professional if they continue or are bothersome):  constipation  diarrhea  hair loss  loss of appetite  nausea  rash  vomiting This list may not describe all possible side effects. Call your doctor for medical advice about side effects. You may report side effects to FDA at 1-800-FDA-1088. Where should I keep my medicine? This drug is given in a hospital or clinic and will not be stored at home. NOTE: This sheet is a summary. It may not cover all possible information. If you have questions about this medicine, talk to your doctor, pharmacist, or health care provider.  2020 [employer] (2017-06-01 18:06:11)

## 2018-10-06 ENCOUNTER — Other Ambulatory Visit: Payer: BC Managed Care – PPO

## 2018-10-06 ENCOUNTER — Ambulatory Visit: Payer: BC Managed Care – PPO | Admitting: Hematology

## 2018-10-06 ENCOUNTER — Ambulatory Visit: Payer: BC Managed Care – PPO

## 2018-10-06 ENCOUNTER — Telehealth: Payer: Self-pay | Admitting: *Deleted

## 2018-10-06 NOTE — Telephone Encounter (Signed)
Called [name] for chemotherapy F/U.  Patient is doing well.  Denies n/v.  Denies any new side effects or symptoms.  Bowel and bladder functioning well.  Eating small meals and drinking well.  Instructed to drink 64 oz minimum daily or at least the day before, of and after treatment.   Currently denies questions or needs with this call.  Encouraged to call [phone] Mon -Fri 8:00 am - 4:30 pm or anytime as needed for symptoms, changes or event outside office hours.

## 2018-10-06 NOTE — Progress Notes (Signed)
[place]   [phone] [phone]   Clinic Follow up Note   Patient Care Team: Redmond School, MD as PCP - General (Internal Medicine)  Date of Service:  10/12/2018  CHIEF COMPLAINT: Metastatic non-small cell lung cancer  SUMMARY OF ONCOLOGIC HISTORY: Oncology History Overview Note  Cancer Staging Metastatic non-small cell lung cancer [facility]) Staging form: Lung, AJCC 8th Edition - Clinical stage from 10/17/2017: Stage IV (cT3, cN1, cM1b) - Signed by Truitt Merle, MD on 10/17/2017     Metastatic non-small cell lung cancer ([place])  09/20/2017 Imaging   US Breast Left 09/20/17  IMPRESSION Indeterminte palpable mass measuring 3.2x1.7x2.8 cm  inferior medial to the inframammary fold of the left breast.     09/20/2017 Initial Biopsy   Diagnosis 09/20/17 Soft Tissue Needle Core Biopsy, inferior medial to IMF - ADENOCARCINOMA. - SEE MICROSCOPIC DESCRIPTION.  Microscopic Comment Immunohistochemistry will be performed and reported as an addendum. (JDP:ah 09/23/17) ADDENDUM: Immunohistochemistry shows the tumor is strongly positive with cytokeratin AE1/AE3, cytokeratin 7 and shows moderate weak positivity with CDX-2. The tumor is negative with estrogen receptor, progesterone receptor, GCDFP, GATA-3, Napsin A, TTF-1, WT-1 and cytokeratin 20. The immunophenotype is consistent with metastatic carcinoma. The combination of CDX-2 and cytokeratin 7 positivity raises the possibility of an upper gastrointestinal primary. Clinical correlation is essential.   10/06/2017 Initial Diagnosis   Metastatic adenocarcinoma involving soft tissue with unknown primary site [facility])   10/10/2017 Procedure   Upper Endoscopy by Dr. Silverio Decamp 10/10/17  IMPRESSION - Normal esophagus. - Z-line regular, 35 cm from the incisors. - Gastric bypass with a normal-sized pouch and intact staple line. Gastrojejunal anastomosis characterized by healthy appearing mucosa. - No specimens collected.   10/12/2017 Imaging   CT CAP WO Contrast 10/12/17  IMPRESSION: 7 cm central right lung mass involving the right hilum, with postobstructive collapse of the right middle lobe, highly suspicious for primary bronchogenic carcinoma. 5 cm masslike opacity in the superior segment of the right lower lobe may represent carcinoma or postobstructive pneumonitis. 3.3 cm soft tissue mass in the lower anterior chest wall soft tissues, suspicious for metastatic disease. No evidence of abdominal or pelvic metastatic disease.    10/14/2017 Imaging   MRI Brain 10/14/17  IMPRESSION: 1. No metastatic disease or acute intracranial abnormality. Normal MRI appearance of the brain. 2. 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     10/17/2017 Cancer Staging   Staging form: Lung, AJCC 8th Edition - Clinical stage from 10/17/2017: Stage IV (cT3, cN1, cM1b) - Signed by Truitt Merle, MD on 10/17/2017   10/24/2017 - 11/04/2017 Radiation Therapy    The Right lung mass was treated to 30 Gy in 10 fractions of 3 Gy   11/09/2017 - 03/24/2018 Chemotherapy   -first line chemo with carboplatin, paclitaxel, Atezolizumab and bevacizumab every 3weeks starting 11/08/17.  -Switched to maintenance therapy with avastin and atezolizumab q3weeks on 03/24/18   01/10/2018 Imaging   01/10/2018 CT CA IMPRESSION: 1. Response to therapy of central right lower lobe lung mass. Re-expansion of the right middle lobe with significantly improved right lower lobe postobstructive pneumonitis. Residual geographic airspace and ground-glass opacity within the medial right lung, primarily favored to be radiation induced. 2. Near complete resolution of presternal soft tissue mass. 3. New right adrenal nodule since 10/11/2017, suspicious for metastatic disease. 4. Right-sided Port-A-Cath with probable nonocclusive thrombus along the catheter within the right brachiocephalic vein. 5. Age advanced coronary artery atherosclerosis.  Recommend assessment of coronary risk factors  and consideration of medical therapy. 6. Aortic atherosclerosis (ICD10-I70.0) and emphysema (ICD10-J43.9).   03/10/2018 Imaging   CT CAP W Contrast 03/10/18 IMPRESSION: 1. Radiation changes involving the right paramediastinal lung and right hilum but no findings suspicious for residual or recurrent tumor. 2. No evidence of pulmonary metastatic disease. Stable emphysematous changes and areas of pulmonary scarring. 3. Stable 11 mm right adrenal gland nodule. 4. No findings for metastatic disease involving the liver or bony structures.   03/24/2018 - 04/24/2018 Chemotherapy   The patient had bevacizumab (AVASTIN) 1,300 mg in sodium chloride 0.9 % 100 mL chemo infusion, 15.5 mg/kg = 1,275 mg, Intravenous,  Once, 1 of 4 cycles Administration: 1,300 mg (03/24/2018) atezolizumab (TECENTRIQ) 1,200 mg in sodium chloride 0.9 % 250 mL chemo infusion, 1,200 mg, Intravenous, Once, 1 of 4 cycles Administration: 1,200 mg (03/24/2018)  for chemotherapy treatment.    04/11/2018 Imaging   CT ANGIO CHEST PE W OR WO CONTRAST  IMPRESSION: 1. No evidence of acute pulmonary embolism. 2. Progressive radiation changes in the right perihilar region. Cavitary retro hilar mass and right paratracheal lymph node have enlarged, suspicious for local recurrence/disease progression. 3. Enlarging right adrenal nodule suspicious for metastatic disease. 4. New right pleural effusion with increased atelectasis at both lung bases. 5. These results will be called to the ordering clinician or representative by the Radiology Department at the imaging location.    04/21/2018 Imaging   NUCLEAR MEDICINE PET SKULL BASE TO THIGH IMPRESSION: 1. Marked hypermetabolism in the amorphous soft tissue involving the right hilum. This represents the central component of the apparent radiation scarring. 2. Hypermetabolic mediastinal nodal metastases with nodal metastases identified in the  anterior right juxta diaphragmatic fat. 3. Hypermetabolic right adrenal metastasis. 4. Interval progression of loculated right pleural effusion.    05/02/2018 - 07/25/2018 Chemotherapy   Second line Alimta 539m/m2 every 3 weeks, started 05/02/2018. D/c due to disease pregression after 4 cycles     07/21/2018 Imaging   CT CAP 07/21/18  IMPRESSION: 1. Enlarging right adrenal metastatic lesion a mildly enlarging mediastinal adenopathy compatible with mild progression of disease. No new metastatic foci are identified. 2. Evolutionary findings along the right perihilar/paramediastinal radiation fibrosis including some improvement in aeration in the right upper lobe, but enlargement of the cavitary/centrally necrotic region in the superior segment right lower lobe, which is gas-filled and which probably connects to the otherwise focally occluded bronchus intermedius. 3. Other imaging findings of potential clinical significance: Probable left anterior descending coronary atherosclerosis. Mild to moderate intrahepatic biliary dilatation mild extrahepatic biliary dilatation, much of which may be a physiologic response to cholecystectomy. Prominent stool throughout the colon favors constipation. Aortic Atherosclerosis (ICD10-I70.0).   07/26/2018 - 08/17/2018 Chemotherapy   Third-line docetaxel and cyramza q3 weeks on 07/26/18. D/c after cycle 2 due to PNA and mild disease progression   09/07/2018 Imaging   CT Angio Chest 09/07/18  IMPRESSION: 1. Detection of pulmonary emboli is severely limited by motion artifact and suboptimal contrast bolus timing. Given this limitation, no PE was identified. The pulmonary arteries are dilated which can be seen in patients with elevated pulmonary artery pressures. 2. Diffuse ground-glass airspace opacities involving the right upper lobe, left upper lobe, and left lower lobe. Findings are concerning for multifocal pneumonia (viral or bacterial). 3. Persistent  ill-defined soft tissue in the right perihilar region with multiple pathologically enlarged mediastinal and hilar lymph nodes. There is a growing air-filled cavitary area within the right perihilar region which may be related to post  treatment changes. 4. Small right-sided pleural effusion. 5. Growing subcutaneous soft tissue nodule in the anterior midline left chest wall as detailed above. This could represent a metastatic deposit. Again noted is a right adrenal metastatic lesion as detailed above.   Aortic Atherosclerosis (ICD10-I70.0).   09/07/2018 - 09/14/2018 Hospital Admission   Admit date: 09/07/18 Admission diagnosis: COPD exacerbation, PNA Additional comments: After 3 cycles of third-line chemo she was admitted for COPD and PNA. She was placed on oxygen and treated with antibotics.    10/05/2018 -  Chemotherapy   Fourth-line Weekly Gemcitabine starting 10/05/18   10/11/2018 Imaging   CT AP W Contrast  IMPRESSION: 1. Enlarging right adrenal gland metastatic lesion. 2. Enlarging subcutaneous soft tissue lesions in the lower anterior chest wall and the anterior abdominal wall. 3. New soft tissue lesion noted in the lower aspect of the right ischial rectal fossa. 4. Stable intra and extrahepatic biliary dilatation. No findings for hepatic metastatic disease. 5. No acute abdominal/pelvic findings or lymphadenopathy. 6. No definite bone lesions are identified. Specifically I do not see a definite cause for the patient's right hip pain. MRI would be suggested for further evaluation.      CURRENT THERAPY:  Fourth-line weekly Gemcitabine starting 10/05/18  INTERVAL HISTORY:  Yvonne Lang is here for a follow up and treatment. She presents to the clinic alone. She notes she tolerated first dose Gemcitabine well with mild nausea. This was managed by her antiemetics. She does note pain in her right posterior hip which is related to her cancer progression. She is currently on 13m  prednisone for this week so far.    REVIEW OF SYSTEMS:   Constitutional: Denies fevers, chills or abnormal weight loss Eyes: Denies blurriness of vision Ears, nose, mouth, throat, and face: Denies mucositis or sore throat Respiratory: Denies cough, dyspnea or wheezes Cardiovascular: Denies palpitation, chest discomfort or lower extremity swelling Gastrointestinal:  Denies nausea, heartburn or change in bowel habits Skin: Denies abnormal skin rashes MSK: (+) Right posterior hip pain  Lymphatics: Denies new lymphadenopathy or easy bruising Neurological:Denies numbness, tingling or new weaknesses Behavioral/Psych: Mood is stable, no new changes  All other systems were reviewed with the patient and are negative.  MEDICAL HISTORY:  Past Medical History:  Diagnosis Date   Anxiety    Bronchitis    COPD (chronic obstructive pulmonary disease) (HCC)    Depression    GERD (gastroesophageal reflux disease)    H/O gastric bypass    1999   Irritable bowel syndrome    Lung cancer ([place]     SURGICAL HISTORY: Past Surgical History:  Procedure Laterality Date   BACK SURGERY     CHOLECYSTECTOMY     COLON SURGERY     for a kink in her colon and 12 inchs removed   COLONOSCOPY N/A 12/13/2013   Procedure: COLONOSCOPY;  Surgeon: NRogene Houston MD;  Location: [facility];  Service: Endoscopy;  Laterality: N/A;  200   ESOPHAGOGASTRODUODENOSCOPY N/A 12/13/2013   Procedure: ESOPHAGOGASTRODUODENOSCOPY (EGD);  Surgeon: NRogene Houston MD;  Location: [facility] END[facility];  Service: Endoscopy;  Laterality: N/A;   Gastric Bypas  2000   IR IMAGING GUIDED PORT INSERTION  11/14/2017    I have reviewed the social history and family history with the patient and they are unchanged from previous note.  ALLERGIES:  is allergic to codeine.  MEDICATIONS:  Current Outpatient Medications  Medication Sig Dispense Refill   albuterol (PROVENTIL HFA) 108 (90 Base) MCG/ACT inhaler Inhale  2 puffs into  the lungs 2 (two) times daily. 0.006 g 1   ALPRAZolam (XANAX) 0.25 MG tablet Take 1 tablet (0.25 mg total) by mouth at bedtime as needed for anxiety or sleep. 30 tablet 0   butalbital-acetaminophen-caffeine (FIORICET, ESGIC) 50-325-40 MG tablet Take 1-2 tablets by mouth every 6 (six) hours as needed for headache. 60 tablet 1   CALCIUM-MAGNESIUM-ZINC PO Take 1 tablet by mouth daily.     dexamethasone (DECADRON) 4 MG tablet Take 2 tablets (8 mg total) by mouth 2 (two) times daily. Start the day before Taxotere, then take on day after chemo for 2 days 30 tablet 1   dicyclomine (BENTYL) 20 MG tablet Take 1 tablet (20 mg total) by mouth every 6 (six) hours. 60 tablet 1   FLUoxetine (PROZAC) 40 MG capsule Take 80 mg by mouth daily.     furosemide (LASIX) 20 MG tablet Take 1 tablet (20 mg total) by mouth daily as needed. (Patient taking differently: Take 20 mg by mouth daily as needed for fluid or edema. ) 30 tablet 0   gabapentin (NEURONTIN) 300 MG capsule Take 300 mg by mouth daily as needed (for pain).   4   guaiFENesin (MUCINEX) 600 MG 12 hr tablet Take 1 tablet (600 mg total) by mouth 2 (two) times daily. 60 tablet 0   HYDROcodone-acetaminophen (NORCO) 10-325 MG tablet Take 2 tablets by mouth every 6 (six) hours as needed (For pain.).      ipratropium-albuterol (DUONEB) 0.5-2.5 (3) MG/3ML SOLN Take 3 mLs by nebulization every 4 (four) hours as needed. (Patient taking differently: Take 3 mLs by nebulization every 4 (four) hours as needed (for shortness of breath). ) 360 mL 1   lidocaine-prilocaine (EMLA) cream APPLY 1 APPLICATION TO PORT TOPICALLY AS NEEDED ONE HOUR PRIOR TO PORT ACCESS/CHEMO (Patient taking differently: Apply 1 application topically as needed (For port-a-cath.). ) 30 g 0   magic mouthwash w/lidocaine SOLN Take 5 mLs by mouth 3 (three) times daily as needed for mouth pain. 240 mL 0   Multiple Vitamin (MULTIVITAMIN) tablet Take 1 tablet by mouth daily.     nystatin  (MYCOSTATIN) 100000 UNIT/ML suspension SWISH ORALLY THEN SPIT WITH 5 ML THREE TIMES DAILY (Patient taking differently: Use as directed 5 mLs in the mouth or throat 3 (three) times daily. ) 240 mL 0   omeprazole (PRILOSEC) 20 MG capsule Take 1 capsule (20 mg total) by mouth 2 (two) times daily before a meal. 60 capsule 0   ondansetron (ZOFRAN) 8 MG tablet TAKE 1 TABLET BY MOUTH EVERY 8 HOURS AS NEEDED FOR NAUSEA OR VOMITING 30 tablet 1   potassium chloride SA (K-DUR) 20 MEQ tablet Take 1 tablet (20 mEq total) by mouth every Monday, Wednesday, and Friday. 10 tablet 0   predniSONE (DELTASONE) 20 MG tablet Take 1 tablet (20 mg total) by mouth daily with breakfast. 30 tablet 0   prochlorperazine (COMPAZINE) 10 MG tablet Take 1 tablet (10 mg total) by mouth every 6 (six) hours as needed for nausea or vomiting. 30 tablet 3   rOPINIRole (REQUIP) 0.5 MG tablet Take 0.5 mg by mouth at bedtime as needed (For restless legs.).   11   sucralfate (CARAFATE) 1 g tablet TAKE 1 TABLET(1 GRAM) BY MOUTH FOUR TIMES DAILY (Patient taking differently: Take 1 g by mouth 4 (four) times daily. ) 120 tablet 0   TRELEGY ELLIPTA 100-62.5-25 MCG/INH AEPB Inhale 1 puff into the lungs daily.   5   zolpidem (  AMBIEN) 10 MG tablet Take 10 mg by mouth at bedtime.      No current facility-administered medications for this visit.     PHYSICAL EXAMINATION: ECOG PERFORMANCE STATUS: 2 - Symptomatic, <50% confined to bed  Vitals:   10/12/18 0923  BP: 115/80  Pulse: 82  Resp: 18  Temp: 98 F (36.7 C)  SpO2: 97%   Filed Weights   10/12/18 0923  Weight: 180 lb (81.6 kg)    GENERAL:alert, no distress and comfortable SKIN: skin color, texture, turgor are normal, no rashes (+) 2x2.5cm mid left lower chest nodule EYES: normal, Conjunctiva are pink and non-injected, sclera clear  NECK: supple, thyroid normal size, non-tender, without nodularity LYMPH:  no palpable lymphadenopathy in the cervical, axillary  LUNGS: clear  to auscultation and percussion  (+) B/l wheezes HEART: regular rate & rhythm and no murmurs and no lower extremity edema ABDOMEN:abdomen soft, non-tender and normal bowel sounds Musculoskeletal:no cyanosis of digits and no clubbing  NEURO: alert & oriented x 3 with fluent speech, no focal motor/sensory deficits  LABORATORY DATA:  I have reviewed the data as listed CBC Latest Ref Rng & Units 10/12/2018 10/05/2018 09/13/2018  WBC 4.0 - 10.5 K/uL 5.4 16.7(H) 9.1  Hemoglobin 12.0 - 15.0 g/dL 9.7(L) 10.6(L) 9.8(L)  Hematocrit 36.0 - 46.0 % 31.5(L) 34.7(L) 33.0(L)  Platelets 150 - 400 K/uL 230 351 457(H)     CMP Latest Ref Rng & Units 10/12/2018 10/05/2018 09/14/2018  Glucose 70 - 99 mg/dL 102(H) 103(H) 91  BUN 6 - 20 mg/dL 5(L) 9 6  Creatinine 0.44 - 1.00 mg/dL 0.66 0.66 0.41(L)  Sodium 135 - 145 mmol/L 140 131(L) 140  Potassium 3.5 - 5.1 mmol/L 3.7 4.5 2.9(L)  Chloride 98 - 111 mmol/L 102 98 103  CO2 22 - 32 mmol/L _0 Calcium 8.9 - 10.3 mg/dL 8.5(L) 8.2(L) 8.2(L)  Total Protein 6.5 - 8.1 g/dL 5.9(L) 6.1(L) -  Total Bilirubin 0.3 - 1.2 mg/dL 0.3 0.4 -  Alkaline Phos 38 - 126 U/L 321(H) 174(H) -  AST 15 - 41 U/L 59(H) 19 -  ALT 0 - 44 U/L 128(H) 34 -      RADIOGRAPHIC STUDIES: I have personally reviewed the radiological images as listed and agreed with the findings in the report. Ct Abdomen Pelvis W Contrast  Result Date: 10/12/2018 CLINICAL DATA:  Metastatic lung cancer.  Right hip pain. EXAM: CT ABDOMEN AND PELVIS WITH CONTRAST TECHNIQUE: Multidetector CT imaging of the abdomen and pelvis was performed using the standard protocol following bolus administration of intravenous contrast. CONTRAST:  154m OMNIPAQUE IOHEXOL 300 MG/ML  SOLN COMPARISON:  Abdominal CT scan 07/21/2018 and chest CT 09/07/2018 FINDINGS: Lower chest: The lung bases are clear of an acute process. No worrisome pulmonary lesions or pleural effusion. The heart is normal in size. No pericardial effusion. Enlarging  subcutaneous soft tissue mass noted along the level of the lower sternum on the left side. This measures 24.5 x 22 mm and previously measured 14.5 x 9.5 mm, just 1 month ago. Hepatobiliary: Persistent intra and extrahepatic biliary dilatation without obvious cause. Gallbladder is surgically absent. No focal hepatic lesions to suggest metastatic disease. Pancreas: No mass, inflammation or ductal dilatation. Spleen: Normal size.  No focal lesions. Adrenals/Urinary Tract: Enlarging right adrenal gland metastasis. This measures 40 x 37 mm and previously measured 31 x 30 mm. The left adrenal gland is normal. Both kidneys are unremarkable. The bladder is normal. Stomach/Bowel: The stomach, duodenum, small bowel and  colon are unremarkable. No acute inflammatory changes, mass lesions or obstructive findings. Stable surgical changes involving the stomach from gastric bypass surgery. Vascular/Lymphatic: Scattered atherosclerotic calcifications involving the aorta and branch vessels but no aneurysm or dissection. The major venous structures are patent. No mesenteric or retroperitoneal mass or adenopathy. Reproductive: The uterus and ovaries are unremarkable. Other: No pelvic mass or free pelvic fluid collections. No pelvic or inguinal adenopathy. 11.5 x 10.5 mm soft tissue nodule in the subcutaneous fat just anterior to the anterior abdominal wall fascia on image number 39. This previously measured 8.0 x 6.0 mm. There is a new 9.5 mm soft tissue nodule in the lower aspect of the right ischial rectal fossa which could be a metastatic focus. Musculoskeletal: Stable scoliosis and degenerative lumbar spondylosis with lumbar fusion hardware. Stable grade 1-2 spondylolisthesis at L5. No worrisome bone lesions are identified by CT. Specifically, I do not see a definite cause for the patient's right hip pain. However, MRI would be much more sensitive for subtle metastatic disease or soft tissue lesions if symptoms persist. IMPRESSION:  1. Enlarging right adrenal gland metastatic lesion. 2. Enlarging subcutaneous soft tissue lesions in the lower anterior chest wall and the anterior abdominal wall. 3. New soft tissue lesion noted in the lower aspect of the right ischial rectal fossa. 4. Stable intra and extrahepatic biliary dilatation. No findings for hepatic metastatic disease. 5. No acute abdominal/pelvic findings or lymphadenopathy. 6. No definite bone lesions are identified. Specifically I do not see a definite cause for the patient's right hip pain. MRI would be suggested for further evaluation. Electronically Signed   By: Marijo Sanes M.D.   On: 10/12/2018 08:14     ASSESSMENT & PLAN:  Yvonne Lang is a 53 y.o. female with   1. 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 -Diagnosed in 09/2017. Treated with chemo and radiation. She was initially onfirst linecarboplatin, paclitaxel, Atezolizumab and bevacizumab every 3weekss/p 6 cycles, with good partial response.Switched tomaintenance therapy withAvastinandAtezolizumabq3weeksstartedon 03/24/18. -Due to disease progression, chemoswitched tosecond line alimta 560m/m2 every 3 weekson 05/02/2018, shehas been tolerating well -Based onCT CAP from 5/1/20she hadfurtherdisease progression. Second-line treatment was d/c after 4 cycles.  -She started third-linedocetaxel and cyramza q3 weeks on 07/26/18.She toleratesmoderately well with moderate fatigue and N&V.  -S/p 3 cycles, she had recent COPDexacerbationandPNA in 08/2018.Her CT angio chest duringhospitalstay showed mild progression of chest LN and soft tissue nodule of anterior midline left chest wall.  -Given mild disease progression and possible relationof docetaxelto hermultifocalPNA I d/c her third-line treatment.  -We discussed her new baseline CT AP from 10/12/18 which shows increased right adrenal gland met, increased soft tissue mass in lower anterior chest  wall and increased soft tissue lower aspect of right hip. No findings of liver mets or bone. Will repeat in 2-3 months of chemo.  -I started her on fourth line weekly Gemcitabine on 10/05/18. She tolerated first cycle well with mild nausea, manageable.  -Physical exam today shows 2x2.5cm mid left lower chest nodule. She also has wheezing but she is tolerating prednisone taperring. Will monitor.  -Labs reviewed, CBC and CMP WNL except Hg 9.7, BG 102, Ca 8.5, protein 5.8, albumin 2.9, AST 59, ALT 128, Alk Phos 321. Ferritin still pending. Overall adequate to proceed with Gemcitabine -F/u in 2 weeks   2.Dyspnea and dry cough secondary to her COPD,right lung cancer and radiation changes  -Overall stable, she will continuebronchodilator -In 08/2017 she had recent COPD exacerbation andPneumoniainfection. She was  treated inpatient with oxygen, antibiotics and steroids. -She was discharged on 3L oxygen [place] and 60m prednisone daily and breathing treatment TID.  -Starting 09/22/18, I will have her reduce prednisone to 386mand continue to very slowly ween her off.  -She still has Dyspnea upon mild activities. Dry cough persists. -Her breathing is stable on 32m41mrednisone and she was able to reduce her oxygen from 2L. Will decrease Prednisone to 32mg66mery other day starting 10/16/18 for 1 week and then stop.  -She does have wheezing on exam today (10/12/18). She will continue nebulizer at home as needed.  -Will repeat CT chest in 2 months  -Will give her oxygen in clinic today.   3. Frontal Headaches -f/u with Dr. VaslMickeal Skinnererall stableand improved  4. Anxiety and Depression  -Currently on Xanax and Cymbalta.Stable  5. Chronic back pain  -Currently on Tramadol 50 mg q6Hr.Stable -Also recently has body aches. Will monitor.   6. Low appetite, Nausea -Currently on Mirtazapine, omeprazole, ondansetron,prochlorperazine, and sucralfate. -Nutritional supplement -Mild and well managed  with start of gemcitabine   7. Hypokalemia  -OnKCL 40me16mily  8.Transaminitis -Probably related to chemotherapy, will continue monitoring. -AST 59, ALT 128, Alk Phos 321 today (10/12/18)  9.Goal of care discussion, DNR/DNI -The patient understands the goal of care is palliative. -We previously discussed overall poor prognosis due to the disease progression,she has had multiple lines ofchemo, the response from future treatment will be low -We discussed DNR/DNIwhen she was recently in hospital, and she agreed DNR/DNI   Plan -CT AP reviewed with her today  -She continues to clinically improve   -Will decrease prednisone to 32mg e432my other day next week for a week  then will stop  -Labs reviewed and adequate to proceed with Gemcitabine today, off next week  -Lab, flush, f/u and Gemcitabine in 2 weeks    No problem-specific Assessment & Plan notes found for this encounter.   No orders of the defined types were placed in this encounter.  All questions were answered. The patient knows to call the clinic with any problems, questions or concerns. No barriers to learning was detected. I spent 20 minutes counseling the patient face to face. The total time spent in the appointment was 25 minutes and more than 50% was on counseling and review of test results     Yan FeTruitt Merle/23/2020   I, Amoya Joslyn Devoncting as scribe for Yan FeTruitt Merle  I have reviewed the above documentation for accuracy and completeness, and I agree with the above.

## 2018-10-06 NOTE — Telephone Encounter (Signed)
-----   Message from Ishmael Holter, RN sent at 10/05/2018  3:27 PM EDT ----- Regarding: Dr. Burr Medico 1st tx f/u call Dr. Burr Medico 1st tx f/u call

## 2018-10-09 ENCOUNTER — Telehealth: Payer: Self-pay

## 2018-10-09 NOTE — Telephone Encounter (Signed)
Spoke with patient regarding [facility], Dr. Burr Medico wanted her to have this prior to her treatment on 7/23, [facility] had not openings, but [facility] has one on Wednesday 7/22 at 1:00, instructed her to arrive at 12:45, NPO 4 hours prior, she can pick up 2 bottles of oral contrast from [facility], drink one bottle at 11:00 am and second bottle at 12:00.  She verbalized an understanding and states she will do so.

## 2018-10-10 ENCOUNTER — Telehealth: Payer: Self-pay

## 2018-10-10 ENCOUNTER — Encounter: Payer: Self-pay | Admitting: Nurse Practitioner

## 2018-10-10 NOTE — Telephone Encounter (Signed)
Returned TC to patient in regard to question about wanting to know if she needed to also have a chest scan tomorrow when she goes in to get her CT done. I let her know that Lacie said no she just had chest imaging done 09/07/18 and she does not need a repeat. Patient verbalized understanding. No further problems or concerns at this time.

## 2018-10-11 ENCOUNTER — Other Ambulatory Visit: Payer: Self-pay

## 2018-10-11 ENCOUNTER — Ambulatory Visit (HOSPITAL_COMMUNITY)
Admission: RE | Admit: 2018-10-11 | Discharge: 2018-10-11 | Disposition: A | Discharge status: Home / Self Care | Payer: BC Managed Care – PPO | Source: Ambulatory Visit | Attending: Nurse Practitioner | Admitting: Nurse Practitioner

## 2018-10-11 DIAGNOSIS — C3490 Malignant neoplasm of unspecified part of unspecified bronchus or lung: Secondary | ICD-10-CM | POA: Diagnosis not present

## 2018-10-11 MED ORDER — HEPARIN SOD (PORK) LOCK FLUSH 100 UNIT/ML IV SOLN
INTRAVENOUS | Status: AC
  Filled 2018-10-11: qty 5

## 2018-10-11 MED ORDER — HEPARIN SOD (PORK) LOCK FLUSH 100 UNIT/ML IV SOLN
500.0000 [IU] | Freq: Once | INTRAVENOUS | Status: AC
  Administered 2018-10-11: 500 [IU] via INTRAVENOUS

## 2018-10-11 MED ORDER — SODIUM CHLORIDE (PF) 0.9 % IJ SOLN
INTRAMUSCULAR | Status: AC
  Filled 2018-10-11: qty 50

## 2018-10-11 MED ORDER — IOHEXOL 300 MG/ML  SOLN
100.0000 mL | Freq: Once | INTRAMUSCULAR | Status: AC | PRN
  Administered 2018-10-11: 14:00:00 100 mL via INTRAVENOUS

## 2018-10-12 ENCOUNTER — Inpatient Hospital Stay: Payer: BC Managed Care – PPO

## 2018-10-12 ENCOUNTER — Encounter: Payer: Self-pay | Admitting: Hematology

## 2018-10-12 ENCOUNTER — Inpatient Hospital Stay (HOSPITAL_BASED_OUTPATIENT_CLINIC_OR_DEPARTMENT_OTHER): Payer: BC Managed Care – PPO | Admitting: Hematology

## 2018-10-12 ENCOUNTER — Other Ambulatory Visit: Payer: Self-pay

## 2018-10-12 VITALS — BP 115/80 | HR 82 | Temp 98.0°F | Resp 18 | Ht 67.0 in | Wt 180.0 lb

## 2018-10-12 DIAGNOSIS — C3490 Malignant neoplasm of unspecified part of unspecified bronchus or lung: Secondary | ICD-10-CM

## 2018-10-12 DIAGNOSIS — M545 Low back pain: Secondary | ICD-10-CM | POA: Diagnosis not present

## 2018-10-12 DIAGNOSIS — J441 Chronic obstructive pulmonary disease with (acute) exacerbation: Secondary | ICD-10-CM | POA: Diagnosis not present

## 2018-10-12 DIAGNOSIS — K219 Gastro-esophageal reflux disease without esophagitis: Secondary | ICD-10-CM

## 2018-10-12 DIAGNOSIS — Z79899 Other long term (current) drug therapy: Secondary | ICD-10-CM

## 2018-10-12 DIAGNOSIS — C7971 Secondary malignant neoplasm of right adrenal gland: Secondary | ICD-10-CM | POA: Diagnosis not present

## 2018-10-12 DIAGNOSIS — R05 Cough: Secondary | ICD-10-CM

## 2018-10-12 DIAGNOSIS — R110 Nausea: Secondary | ICD-10-CM

## 2018-10-12 DIAGNOSIS — R222 Localized swelling, mass and lump, trunk: Secondary | ICD-10-CM

## 2018-10-12 DIAGNOSIS — D630 Anemia in neoplastic disease: Secondary | ICD-10-CM

## 2018-10-12 DIAGNOSIS — F411 Generalized anxiety disorder: Secondary | ICD-10-CM

## 2018-10-12 DIAGNOSIS — E876 Hypokalemia: Secondary | ICD-10-CM | POA: Diagnosis not present

## 2018-10-12 DIAGNOSIS — C3431 Malignant neoplasm of lower lobe, right bronchus or lung: Secondary | ICD-10-CM

## 2018-10-12 DIAGNOSIS — R51 Headache: Secondary | ICD-10-CM | POA: Diagnosis not present

## 2018-10-12 DIAGNOSIS — R740 Nonspecific elevation of levels of transaminase and lactic acid dehydrogenase [LDH]: Secondary | ICD-10-CM

## 2018-10-12 DIAGNOSIS — Z9981 Dependence on supplemental oxygen: Secondary | ICD-10-CM

## 2018-10-12 DIAGNOSIS — G8929 Other chronic pain: Secondary | ICD-10-CM

## 2018-10-12 DIAGNOSIS — F329 Major depressive disorder, single episode, unspecified: Secondary | ICD-10-CM

## 2018-10-12 DIAGNOSIS — F419 Anxiety disorder, unspecified: Secondary | ICD-10-CM

## 2018-10-12 DIAGNOSIS — M25551 Pain in right hip: Secondary | ICD-10-CM

## 2018-10-12 DIAGNOSIS — Z95828 Presence of other vascular implants and grafts: Secondary | ICD-10-CM

## 2018-10-12 DIAGNOSIS — Z9221 Personal history of antineoplastic chemotherapy: Secondary | ICD-10-CM

## 2018-10-12 DIAGNOSIS — Z5111 Encounter for antineoplastic chemotherapy: Secondary | ICD-10-CM | POA: Diagnosis not present

## 2018-10-12 DIAGNOSIS — Z923 Personal history of irradiation: Secondary | ICD-10-CM

## 2018-10-12 LAB — CBC WITH DIFFERENTIAL (CANCER CENTER ONLY)
Abs Immature Granulocytes: 0.02 10*3/uL (ref 0.00–0.07)
Basophils Absolute: 0 10*3/uL (ref 0.0–0.1)
Basophils Relative: 0 %
Eosinophils Absolute: 0 10*3/uL (ref 0.0–0.5)
Eosinophils Relative: 1 %
HCT: 31.5 % — ABNORMAL LOW (ref 36.0–46.0)
Hemoglobin: 9.7 g/dL — ABNORMAL LOW (ref 12.0–15.0)
Immature Granulocytes: 0 %
Lymphocytes Relative: 16 %
Lymphs Abs: 0.8 10*3/uL (ref 0.7–4.0)
MCH: 29.1 pg (ref 26.0–34.0)
MCHC: 30.8 g/dL (ref 30.0–36.0)
MCV: 94.6 fL (ref 80.0–100.0)
Monocytes Absolute: 0.5 10*3/uL (ref 0.1–1.0)
Monocytes Relative: 9 %
Neutro Abs: 4 10*3/uL (ref 1.7–7.7)
Neutrophils Relative %: 74 %
Platelet Count: 230 10*3/uL (ref 150–400)
RBC: 3.33 MIL/uL — ABNORMAL LOW (ref 3.87–5.11)
RDW: 14.1 % (ref 11.5–15.5)
WBC Count: 5.4 10*3/uL (ref 4.0–10.5)
nRBC: 0 % (ref 0.0–0.2)

## 2018-10-12 LAB — CMP (CANCER CENTER ONLY)
ALT: 128 U/L — ABNORMAL HIGH (ref 0–44)
AST: 59 U/L — ABNORMAL HIGH (ref 15–41)
Albumin: 2.9 g/dL — ABNORMAL LOW (ref 3.5–5.0)
Alkaline Phosphatase: 321 U/L — ABNORMAL HIGH (ref 38–126)
Anion gap: 9 (ref 5–15)
BUN: 5 mg/dL — ABNORMAL LOW (ref 6–20)
CO2: 29 mmol/L (ref 22–32)
Calcium: 8.5 mg/dL — ABNORMAL LOW (ref 8.9–10.3)
Chloride: 102 mmol/L (ref 98–111)
Creatinine: 0.66 mg/dL (ref 0.44–1.00)
GFR, Est AFR Am: >60 mL/min (ref >60)
GFR, Estimated: >60 mL/min (ref >60)
Glucose, Bld: 102 mg/dL — ABNORMAL HIGH (ref 70–99)
Potassium: 3.7 mmol/L (ref 3.5–5.1)
Sodium: 140 mmol/L (ref 135–145)
Total Bilirubin: 0.3 mg/dL (ref 0.3–1.2)
Total Protein: 5.9 g/dL — ABNORMAL LOW (ref 6.5–8.1)

## 2018-10-12 LAB — FERRITIN: Ferritin: 256 ng/mL (ref 11–307)

## 2018-10-12 MED ORDER — PROCHLORPERAZINE MALEATE 10 MG PO TABS
10.0000 mg | ORAL_TABLET | Freq: Once | ORAL | Status: AC
  Administered 2018-10-12: 10 mg via ORAL

## 2018-10-12 MED ORDER — SODIUM CHLORIDE 0.9 % IV SOLN
1000.0000 mg/m2 | Freq: Once | INTRAVENOUS | Status: AC
  Administered 2018-10-12: 11:00:00 2014 mg via INTRAVENOUS
  Filled 2018-10-12: qty 52.97

## 2018-10-12 MED ORDER — SODIUM CHLORIDE 0.9% FLUSH
10.0000 mL | INTRAVENOUS | Status: DC | PRN
  Administered 2018-10-12: 10 mL
  Filled 2018-10-12: qty 10

## 2018-10-12 MED ORDER — HEPARIN SOD (PORK) LOCK FLUSH 100 UNIT/ML IV SOLN
500.0000 [IU] | Freq: Once | INTRAVENOUS | Status: AC | PRN
  Administered 2018-10-12: 500 [IU]
  Filled 2018-10-12: qty 5

## 2018-10-12 MED ORDER — SODIUM CHLORIDE 0.9 % IV SOLN
Freq: Once | INTRAVENOUS | Status: AC
  Administered 2018-10-12: 10:00:00 via INTRAVENOUS
  Filled 2018-10-12: qty 250

## 2018-10-12 MED ORDER — PROCHLORPERAZINE MALEATE 10 MG PO TABS
ORAL_TABLET | ORAL | Status: AC
  Filled 2018-10-12: qty 1

## 2018-10-12 NOTE — Progress Notes (Signed)
Faxed order to [facility] for Oxygen needs, fax [phone]

## 2018-10-12 NOTE — Progress Notes (Signed)
Okay to treat with ALT 128 per Dr. Burr Medico. No dose change to Gemzar per Dr. Burr Medico.

## 2018-10-12 NOTE — Patient Instructions (Signed)
[facility] °Discharge Instructions for Patients Receiving Chemotherapy ° °Today you received the following chemotherapy agents Gemzar ° °To help prevent nausea and vomiting after your treatment, we encourage you to take your nausea medication as directed. °  °If you develop nausea and vomiting that is not controlled by your nausea medication, call the clinic.  ° °BELOW ARE SYMPTOMS THAT SHOULD BE REPORTED IMMEDIATELY: °· *FEVER GREATER THAN 100.5 F °· *CHILLS WITH OR WITHOUT FEVER °· NAUSEA AND VOMITING THAT IS NOT CONTROLLED WITH YOUR NAUSEA MEDICATION °· *UNUSUAL SHORTNESS OF BREATH °· *UNUSUAL BRUISING OR BLEEDING °· TENDERNESS IN MOUTH AND THROAT WITH OR WITHOUT PRESENCE OF ULCERS °· *URINARY PROBLEMS °· *BOWEL PROBLEMS °· UNUSUAL RASH °Items with * indicate a potential emergency and should be followed up as soon as possible. ° °Feel free to call the clinic should you have any questions or concerns. The clinic phone number is [phone]. ° °Please show the CHEMO ALERT CARD at check-in to the Emergency Department and triage nurse. ° ° °

## 2018-10-13 ENCOUNTER — Telehealth: Payer: Self-pay | Admitting: Hematology

## 2018-10-13 NOTE — Telephone Encounter (Signed)
No los per 7/23.

## 2018-10-14 DIAGNOSIS — C3490 Malignant neoplasm of unspecified part of unspecified bronchus or lung: Secondary | ICD-10-CM | POA: Diagnosis not present

## 2018-10-16 ENCOUNTER — Telehealth: Payer: Self-pay

## 2018-10-16 NOTE — Telephone Encounter (Signed)
Refaxed form for oxygen needs to [facility] and asked them to reach out to the patient.

## 2018-10-18 ENCOUNTER — Encounter: Payer: Self-pay | Admitting: Hematology

## 2018-10-18 DIAGNOSIS — Z7189 Other specified counseling: Secondary | ICD-10-CM | POA: Insufficient documentation

## 2018-10-20 ENCOUNTER — Telehealth: Payer: Self-pay | Admitting: Nurse Practitioner

## 2018-10-20 NOTE — Telephone Encounter (Signed)
R/s appt per 7/30 sch message - spoke with patient and they are aware of appt date and time

## 2018-10-23 ENCOUNTER — Other Ambulatory Visit: Payer: Self-pay | Admitting: Hematology

## 2018-10-23 DIAGNOSIS — C3490 Malignant neoplasm of unspecified part of unspecified bronchus or lung: Secondary | ICD-10-CM | POA: Diagnosis not present

## 2018-10-24 ENCOUNTER — Other Ambulatory Visit: Payer: Self-pay | Admitting: Nurse Practitioner

## 2018-10-24 ENCOUNTER — Other Ambulatory Visit: Payer: Self-pay | Admitting: Hematology

## 2018-10-24 ENCOUNTER — Other Ambulatory Visit: Payer: Self-pay

## 2018-10-24 DIAGNOSIS — C3490 Malignant neoplasm of unspecified part of unspecified bronchus or lung: Secondary | ICD-10-CM

## 2018-10-25 DIAGNOSIS — M5136 Other intervertebral disc degeneration, lumbar region: Secondary | ICD-10-CM | POA: Diagnosis not present

## 2018-10-25 DIAGNOSIS — G894 Chronic pain syndrome: Secondary | ICD-10-CM | POA: Diagnosis not present

## 2018-10-25 DIAGNOSIS — C3490 Malignant neoplasm of unspecified part of unspecified bronchus or lung: Secondary | ICD-10-CM | POA: Diagnosis not present

## 2018-10-26 ENCOUNTER — Encounter: Payer: Self-pay | Admitting: Nurse Practitioner

## 2018-10-26 ENCOUNTER — Inpatient Hospital Stay (HOSPITAL_BASED_OUTPATIENT_CLINIC_OR_DEPARTMENT_OTHER): Payer: BC Managed Care – PPO | Admitting: Nurse Practitioner

## 2018-10-26 ENCOUNTER — Inpatient Hospital Stay: Payer: BC Managed Care – PPO

## 2018-10-26 ENCOUNTER — Inpatient Hospital Stay: Payer: BC Managed Care – PPO | Attending: Hematology

## 2018-10-26 ENCOUNTER — Other Ambulatory Visit: Payer: Self-pay

## 2018-10-26 VITALS — BP 132/84 | HR 95 | Temp 97.8°F | Resp 18 | Ht 67.0 in | Wt 178.9 lb

## 2018-10-26 DIAGNOSIS — M549 Dorsalgia, unspecified: Secondary | ICD-10-CM | POA: Insufficient documentation

## 2018-10-26 DIAGNOSIS — C3490 Malignant neoplasm of unspecified part of unspecified bronchus or lung: Secondary | ICD-10-CM | POA: Diagnosis not present

## 2018-10-26 DIAGNOSIS — R740 Nonspecific elevation of levels of transaminase and lactic acid dehydrogenase [LDH]: Secondary | ICD-10-CM | POA: Insufficient documentation

## 2018-10-26 DIAGNOSIS — R110 Nausea: Secondary | ICD-10-CM | POA: Insufficient documentation

## 2018-10-26 DIAGNOSIS — Z5111 Encounter for antineoplastic chemotherapy: Secondary | ICD-10-CM | POA: Diagnosis not present

## 2018-10-26 DIAGNOSIS — Z9049 Acquired absence of other specified parts of digestive tract: Secondary | ICD-10-CM | POA: Insufficient documentation

## 2018-10-26 DIAGNOSIS — M25551 Pain in right hip: Secondary | ICD-10-CM | POA: Diagnosis not present

## 2018-10-26 DIAGNOSIS — I700 Atherosclerosis of aorta: Secondary | ICD-10-CM | POA: Diagnosis not present

## 2018-10-26 DIAGNOSIS — E876 Hypokalemia: Secondary | ICD-10-CM | POA: Insufficient documentation

## 2018-10-26 DIAGNOSIS — R0902 Hypoxemia: Secondary | ICD-10-CM | POA: Insufficient documentation

## 2018-10-26 DIAGNOSIS — Z95828 Presence of other vascular implants and grafts: Secondary | ICD-10-CM

## 2018-10-26 DIAGNOSIS — F419 Anxiety disorder, unspecified: Secondary | ICD-10-CM | POA: Insufficient documentation

## 2018-10-26 DIAGNOSIS — F329 Major depressive disorder, single episode, unspecified: Secondary | ICD-10-CM | POA: Insufficient documentation

## 2018-10-26 DIAGNOSIS — J189 Pneumonia, unspecified organism: Secondary | ICD-10-CM | POA: Insufficient documentation

## 2018-10-26 DIAGNOSIS — R000 Tachycardia, unspecified: Secondary | ICD-10-CM | POA: Diagnosis not present

## 2018-10-26 DIAGNOSIS — R079 Chest pain, unspecified: Secondary | ICD-10-CM | POA: Diagnosis not present

## 2018-10-26 DIAGNOSIS — R42 Dizziness and giddiness: Secondary | ICD-10-CM | POA: Diagnosis not present

## 2018-10-26 DIAGNOSIS — M79629 Pain in unspecified upper arm: Secondary | ICD-10-CM | POA: Insufficient documentation

## 2018-10-26 DIAGNOSIS — C7971 Secondary malignant neoplasm of right adrenal gland: Secondary | ICD-10-CM | POA: Diagnosis not present

## 2018-10-26 DIAGNOSIS — J449 Chronic obstructive pulmonary disease, unspecified: Secondary | ICD-10-CM | POA: Insufficient documentation

## 2018-10-26 DIAGNOSIS — Z885 Allergy status to narcotic agent status: Secondary | ICD-10-CM | POA: Insufficient documentation

## 2018-10-26 DIAGNOSIS — C7989 Secondary malignant neoplasm of other specified sites: Secondary | ICD-10-CM | POA: Diagnosis not present

## 2018-10-26 DIAGNOSIS — J440 Chronic obstructive pulmonary disease with acute lower respiratory infection: Secondary | ICD-10-CM | POA: Diagnosis not present

## 2018-10-26 DIAGNOSIS — Z9221 Personal history of antineoplastic chemotherapy: Secondary | ICD-10-CM | POA: Insufficient documentation

## 2018-10-26 DIAGNOSIS — C3431 Malignant neoplasm of lower lobe, right bronchus or lung: Secondary | ICD-10-CM | POA: Diagnosis not present

## 2018-10-26 DIAGNOSIS — R0600 Dyspnea, unspecified: Secondary | ICD-10-CM | POA: Insufficient documentation

## 2018-10-26 DIAGNOSIS — I2510 Atherosclerotic heart disease of native coronary artery without angina pectoris: Secondary | ICD-10-CM | POA: Insufficient documentation

## 2018-10-26 DIAGNOSIS — J441 Chronic obstructive pulmonary disease with (acute) exacerbation: Secondary | ICD-10-CM | POA: Diagnosis not present

## 2018-10-26 DIAGNOSIS — Z923 Personal history of irradiation: Secondary | ICD-10-CM | POA: Insufficient documentation

## 2018-10-26 DIAGNOSIS — Z79899 Other long term (current) drug therapy: Secondary | ICD-10-CM | POA: Insufficient documentation

## 2018-10-26 DIAGNOSIS — G8929 Other chronic pain: Secondary | ICD-10-CM | POA: Insufficient documentation

## 2018-10-26 DIAGNOSIS — R51 Headache: Secondary | ICD-10-CM | POA: Insufficient documentation

## 2018-10-26 DIAGNOSIS — C787 Secondary malignant neoplasm of liver and intrahepatic bile duct: Secondary | ICD-10-CM | POA: Diagnosis not present

## 2018-10-26 DIAGNOSIS — Z9884 Bariatric surgery status: Secondary | ICD-10-CM | POA: Insufficient documentation

## 2018-10-26 DIAGNOSIS — R05 Cough: Secondary | ICD-10-CM | POA: Diagnosis not present

## 2018-10-26 LAB — CBC WITH DIFFERENTIAL (CANCER CENTER ONLY)
Abs Immature Granulocytes: 0.05 10*3/uL (ref 0.00–0.07)
Basophils Absolute: 0.1 10*3/uL (ref 0.0–0.1)
Basophils Relative: 1 %
Eosinophils Absolute: 0.1 10*3/uL (ref 0.0–0.5)
Eosinophils Relative: 1 %
HCT: 32.6 % — ABNORMAL LOW (ref 36.0–46.0)
Hemoglobin: 9.9 g/dL — ABNORMAL LOW (ref 12.0–15.0)
Immature Granulocytes: 1 %
Lymphocytes Relative: 7 %
Lymphs Abs: 0.7 10*3/uL (ref 0.7–4.0)
MCH: 30.3 pg (ref 26.0–34.0)
MCHC: 30.4 g/dL (ref 30.0–36.0)
MCV: 99.7 fL (ref 80.0–100.0)
Monocytes Absolute: 1.1 10*3/uL — ABNORMAL HIGH (ref 0.1–1.0)
Monocytes Relative: 12 %
Neutro Abs: 7.3 10*3/uL (ref 1.7–7.7)
Neutrophils Relative %: 78 %
Platelet Count: 756 10*3/uL — ABNORMAL HIGH (ref 150–400)
RBC: 3.27 MIL/uL — ABNORMAL LOW (ref 3.87–5.11)
RDW: 17.7 % — ABNORMAL HIGH (ref 11.5–15.5)
WBC Count: 9.2 10*3/uL (ref 4.0–10.5)
nRBC: 0 % (ref 0.0–0.2)

## 2018-10-26 LAB — CMP (CANCER CENTER ONLY)
ALT: 88 U/L — ABNORMAL HIGH (ref 0–44)
AST: 121 U/L — ABNORMAL HIGH (ref 15–41)
Albumin: 3 g/dL — ABNORMAL LOW (ref 3.5–5.0)
Alkaline Phosphatase: 343 U/L — ABNORMAL HIGH (ref 38–126)
Anion gap: 9 (ref 5–15)
BUN: 4 mg/dL — ABNORMAL LOW (ref 6–20)
CO2: 27 mmol/L (ref 22–32)
Calcium: 9.1 mg/dL (ref 8.9–10.3)
Chloride: 107 mmol/L (ref 98–111)
Creatinine: 0.71 mg/dL (ref 0.44–1.00)
GFR, Est AFR Am: >60 mL/min (ref >60)
GFR, Estimated: >60 mL/min (ref >60)
Glucose, Bld: 75 mg/dL (ref 70–99)
Potassium: 4.4 mmol/L (ref 3.5–5.1)
Sodium: 143 mmol/L (ref 135–145)
Total Bilirubin: <0.2 mg/dL — ABNORMAL LOW (ref 0.3–1.2)
Total Protein: 6.1 g/dL — ABNORMAL LOW (ref 6.5–8.1)

## 2018-10-26 MED ORDER — HEPARIN SOD (PORK) LOCK FLUSH 100 UNIT/ML IV SOLN
500.0000 [IU] | Freq: Once | INTRAVENOUS | Status: AC | PRN
  Administered 2018-10-26: 500 [IU]
  Filled 2018-10-26: qty 5

## 2018-10-26 MED ORDER — SODIUM CHLORIDE 0.9% FLUSH
10.0000 mL | INTRAVENOUS | Status: DC | PRN
  Administered 2018-10-26: 14:00:00 10 mL
  Filled 2018-10-26: qty 10

## 2018-10-26 MED ORDER — PROCHLORPERAZINE MALEATE 10 MG PO TABS
ORAL_TABLET | ORAL | Status: AC
  Filled 2018-10-26: qty 1

## 2018-10-26 MED ORDER — SODIUM CHLORIDE 0.9 % IV SOLN
Freq: Once | INTRAVENOUS | Status: AC
  Administered 2018-10-26: 16:00:00 via INTRAVENOUS
  Filled 2018-10-26: qty 250

## 2018-10-26 MED ORDER — SODIUM CHLORIDE 0.9 % IV SOLN
2000.0000 mg | Freq: Once | INTRAVENOUS | Status: AC
  Administered 2018-10-26: 2000 mg via INTRAVENOUS
  Filled 2018-10-26: qty 52.6

## 2018-10-26 MED ORDER — PROCHLORPERAZINE MALEATE 10 MG PO TABS
10.0000 mg | ORAL_TABLET | Freq: Once | ORAL | Status: AC
  Administered 2018-10-26: 10 mg via ORAL

## 2018-10-26 MED ORDER — SODIUM CHLORIDE 0.9% FLUSH
10.0000 mL | INTRAVENOUS | Status: DC | PRN
  Administered 2018-10-26: 10 mL
  Filled 2018-10-26: qty 10

## 2018-10-26 NOTE — Patient Instructions (Signed)
[facility] Discharge Instructions for Patients Receiving Chemotherapy  Today you received the following chemotherapy agents:  Gemcitabine  To help prevent nausea and vomiting after your treatment, we encourage you to take your nausea medication as prescribed.    If you develop nausea and vomiting that is not controlled by your nausea medication, call the clinic.   BELOW ARE SYMPTOMS THAT SHOULD BE REPORTED IMMEDIATELY:  *FEVER GREATER THAN 100.5 F  *CHILLS WITH OR WITHOUT FEVER  NAUSEA AND VOMITING THAT IS NOT CONTROLLED WITH YOUR NAUSEA MEDICATION  *UNUSUAL SHORTNESS OF BREATH  *UNUSUAL BRUISING OR BLEEDING  TENDERNESS IN MOUTH AND THROAT WITH OR WITHOUT PRESENCE OF ULCERS  *URINARY PROBLEMS  *BOWEL PROBLEMS  UNUSUAL RASH Items with * indicate a potential emergency and should be followed up as soon as possible.  Feel free to call the clinic should you have any questions or concerns. The clinic phone number is [phone].  Please show the CHEMO ALERT CARD at check-in to the Emergency Department and triage nurse.   

## 2018-10-26 NOTE — Patient Instructions (Signed)

## 2018-10-26 NOTE — Progress Notes (Signed)
Per Cira Rue, NP ok to treat with today's elevated LFT's.

## 2018-10-26 NOTE — Progress Notes (Addendum)
[place]   [phone]) [phone] Fax:(336) [phone]   Clinic Follow up Note   Patient Care Team: Redmond School, MD as PCP - General (Internal Medicine) 10/26/2018  CHIEF COMPLAINT: Follow-up metastatic non-small cell lung cancer  SUMMARY OF ONCOLOGIC HISTORY: Oncology History Overview Note  Cancer Staging Metastatic non-small cell lung cancer [facility]) Staging form: Lung, AJCC 8th Edition - Clinical stage from 10/17/2017: Stage IV (cT3, cN1, cM1b) - Signed by Truitt Merle, MD on 10/17/2017     Metastatic non-small cell lung cancer ([place])  09/20/2017 Imaging   US Breast Left 09/20/17  IMPRESSION Indeterminte palpable mass measuring 3.2x1.7x2.8 cm  inferior medial to the inframammary fold of the left breast.     09/20/2017 Initial Biopsy   Diagnosis 09/20/17 Soft Tissue Needle Core Biopsy, inferior medial to IMF - ADENOCARCINOMA. - SEE MICROSCOPIC DESCRIPTION.  Microscopic Comment Immunohistochemistry will be performed and reported as an addendum. (JDP:ah 09/23/17) ADDENDUM: Immunohistochemistry shows the tumor is strongly positive with cytokeratin AE1/AE3, cytokeratin 7 and shows moderate weak positivity with CDX-2. The tumor is negative with estrogen receptor, progesterone receptor, GCDFP, GATA-3, Napsin A, TTF-1, WT-1 and cytokeratin 20. The immunophenotype is consistent with metastatic carcinoma. The combination of CDX-2 and cytokeratin 7 positivity raises the possibility of an upper gastrointestinal primary. Clinical correlation is essential.   10/06/2017 Initial Diagnosis   Metastatic adenocarcinoma involving soft tissue with unknown primary site [facility])   10/10/2017 Procedure   Upper Endoscopy by Dr. Silverio Decamp 10/10/17  IMPRESSION - Normal esophagus. - Z-line regular, 35 cm from the incisors. - Gastric bypass with a normal-sized pouch and intact staple line. Gastrojejunal anastomosis characterized by healthy appearing mucosa. - No specimens collected.   10/12/2017  Imaging   CT CAP WO Contrast 10/12/17  IMPRESSION: 7 cm central right lung mass involving the right hilum, with postobstructive collapse of the right middle lobe, highly suspicious for primary bronchogenic carcinoma. 5 cm masslike opacity in the superior segment of the right lower lobe may represent carcinoma or postobstructive pneumonitis. 3.3 cm soft tissue mass in the lower anterior chest wall soft tissues, suspicious for metastatic disease. No evidence of abdominal or pelvic metastatic disease.    10/14/2017 Imaging   MRI Brain 10/14/17  IMPRESSION: 1. No metastatic disease or acute intracranial abnormality. Normal MRI appearance of the brain. 2. 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     10/17/2017 Cancer Staging   Staging form: Lung, AJCC 8th Edition - Clinical stage from 10/17/2017: Stage IV (cT3, cN1, cM1b) - Signed by Truitt Merle, MD on 10/17/2017   10/24/2017 - 11/04/2017 Radiation Therapy    The Right lung mass was treated to 30 Gy in 10 fractions of 3 Gy   11/09/2017 - 03/24/2018 Chemotherapy   -first line chemo with carboplatin, paclitaxel, Atezolizumab and bevacizumab every 3weeks starting 11/08/17.  -Switched to maintenance therapy with avastin and atezolizumab q3weeks on 03/24/18   01/10/2018 Imaging   01/10/2018 CT CA IMPRESSION: 1. Response to therapy of central right lower lobe lung mass. Re-expansion of the right middle lobe with significantly improved right lower lobe postobstructive pneumonitis. Residual geographic airspace and ground-glass opacity within the medial right lung, primarily favored to be radiation induced. 2. Near complete resolution of presternal soft tissue mass. 3. New right adrenal nodule since 10/11/2017, suspicious for metastatic disease. 4. Right-sided Port-A-Cath with probable nonocclusive thrombus along the catheter within the right brachiocephalic vein. 5. Age advanced coronary artery atherosclerosis. Recommend assessment  of coronary risk factors and consideration of medical  therapy. 6. Aortic atherosclerosis (ICD10-I70.0) and emphysema (ICD10-J43.9).   03/10/2018 Imaging   CT CAP W Contrast 03/10/18 IMPRESSION: 1. Radiation changes involving the right paramediastinal lung and right hilum but no findings suspicious for residual or recurrent tumor. 2. No evidence of pulmonary metastatic disease. Stable emphysematous changes and areas of pulmonary scarring. 3. Stable 11 mm right adrenal gland nodule. 4. No findings for metastatic disease involving the liver or bony structures.   03/24/2018 - 04/24/2018 Chemotherapy   The patient had bevacizumab (AVASTIN) 1,300 mg in sodium chloride 0.9 % 100 mL chemo infusion, 15.5 mg/kg = 1,275 mg, Intravenous,  Once, 1 of 4 cycles Administration: 1,300 mg (03/24/2018) atezolizumab (TECENTRIQ) 1,200 mg in sodium chloride 0.9 % 250 mL chemo infusion, 1,200 mg, Intravenous, Once, 1 of 4 cycles Administration: 1,200 mg (03/24/2018)  for chemotherapy treatment.    04/11/2018 Imaging   CT ANGIO CHEST PE W OR WO CONTRAST  IMPRESSION: 1. No evidence of acute pulmonary embolism. 2. Progressive radiation changes in the right perihilar region. Cavitary retro hilar mass and right paratracheal lymph node have enlarged, suspicious for local recurrence/disease progression. 3. Enlarging right adrenal nodule suspicious for metastatic disease. 4. New right pleural effusion with increased atelectasis at both lung bases. 5. These results will be called to the ordering clinician or representative by the Radiology Department at the imaging location.    04/21/2018 Imaging   NUCLEAR MEDICINE PET SKULL BASE TO THIGH IMPRESSION: 1. Marked hypermetabolism in the amorphous soft tissue involving the right hilum. This represents the central component of the apparent radiation scarring. 2. Hypermetabolic mediastinal nodal metastases with nodal metastases identified in the anterior right juxta  diaphragmatic fat. 3. Hypermetabolic right adrenal metastasis. 4. Interval progression of loculated right pleural effusion.    05/02/2018 - 07/25/2018 Chemotherapy   Second line Alimta 500mg /m2 every 3 weeks, started 05/02/2018. D/c due to disease pregression after 4 cycles     07/21/2018 Imaging   CT CAP 07/21/18  IMPRESSION: 1. Enlarging right adrenal metastatic lesion a mildly enlarging mediastinal adenopathy compatible with mild progression of disease. No new metastatic foci are identified. 2. Evolutionary findings along the right perihilar/paramediastinal radiation fibrosis including some improvement in aeration in the right upper lobe, but enlargement of the cavitary/centrally necrotic region in the superior segment right lower lobe, which is gas-filled and which probably connects to the otherwise focally occluded bronchus intermedius. 3. Other imaging findings of potential clinical significance: Probable left anterior descending coronary atherosclerosis. Mild to moderate intrahepatic biliary dilatation mild extrahepatic biliary dilatation, much of which may be a physiologic response to cholecystectomy. Prominent stool throughout the colon favors constipation. Aortic Atherosclerosis (ICD10-I70.0).   07/26/2018 - 08/17/2018 Chemotherapy   Third-line docetaxel and cyramza q3 weeks on 07/26/18. D/c after cycle 2 due to PNA and mild disease progression   09/07/2018 Imaging   CT Angio Chest 09/07/18  IMPRESSION: 1. Detection of pulmonary emboli is severely limited by motion artifact and suboptimal contrast bolus timing. Given this limitation, no PE was identified. The pulmonary arteries are dilated which can be seen in patients with elevated pulmonary artery pressures. 2. Diffuse ground-glass airspace opacities involving the right upper lobe, left upper lobe, and left lower lobe. Findings are concerning for multifocal pneumonia (viral or bacterial). 3. Persistent ill-defined soft tissue  in the right perihilar region with multiple pathologically enlarged mediastinal and hilar lymph nodes. There is a growing air-filled cavitary area within the right perihilar region which may be related to post treatment changes. 4. Small  right-sided pleural effusion. 5. Growing subcutaneous soft tissue nodule in the anterior midline left chest wall as detailed above. This could represent a metastatic deposit. Again noted is a right adrenal metastatic lesion as detailed above.   Aortic Atherosclerosis (ICD10-I70.0).   09/07/2018 - 09/14/2018 Hospital Admission   Admit date: 09/07/18 Admission diagnosis: COPD exacerbation, PNA Additional comments: After 3 cycles of third-line chemo she was admitted for COPD and PNA. She was placed on oxygen and treated with antibotics.    10/05/2018 -  Chemotherapy   Fourth-line Weekly Gemcitabine starting 10/05/18   10/11/2018 Imaging   CT AP W Contrast  IMPRESSION: 1. Enlarging right adrenal gland metastatic lesion. 2. Enlarging subcutaneous soft tissue lesions in the lower anterior chest wall and the anterior abdominal wall. 3. New soft tissue lesion noted in the lower aspect of the right ischial rectal fossa. 4. Stable intra and extrahepatic biliary dilatation. No findings for hepatic metastatic disease. 5. No acute abdominal/pelvic findings or lymphadenopathy. 6. No definite bone lesions are identified. Specifically I do not see a definite cause for the patient's right hip pain. MRI would be suggested for further evaluation.     CURRENT THERAPY: Fourth-line weekly Gemcitabine, 2 weeks on and one-week off, starting 10/05/18  INTERVAL HISTORY: Yvonne Lang returns for follow-up and treatment as scheduled.  She was last seen by Dr. Burr Medico on 10/12/2018 and completed cycle 1 day 8 gemcitabine. She is doing OK. She has struggled with prednisone taper, taking 5 mg every other day. On the off day she has more dyspnea and dry cough. Today she took 5 mg on  what should have been her day off. She is easily winded. Takes time and rest to complete ADLs. Using home O2 2 liters. She developed new left low chest and axillary pain that feels deep behind her breast. Her usual pain meds do not help very well. Has difficulty finding comfortable position. Has to sleep propped up at night. She has intermittent nausea. Denies vomiting, constipation, or diarrhea. Appetite is fair. Denies fever, chills. Denies neuropathy. On 2 separate occasions over 1 week she felt off balance and slightly dizzy, did not fall. Denies headache, vision change.    MEDICAL HISTORY:  Past Medical History:  Diagnosis Date  . Anxiety   . Bronchitis   . COPD (chronic obstructive pulmonary disease) ([place])   . Depression   . GERD (gastroesophageal reflux disease)   . H/O gastric bypass    1999  . Irritable bowel syndrome   . Lung cancer [facility])     SURGICAL HISTORY: Past Surgical History:  Procedure Laterality Date  . BACK SURGERY    . CHOLECYSTECTOMY    . COLON SURGERY     for a kink in her colon and 12 inchs removed  . COLONOSCOPY N/A 12/13/2013   Procedure: COLONOSCOPY;  Surgeon: Rogene Houston, MD;  Location: [facility];  Service: Endoscopy;  Laterality: N/A;  200  . ESOPHAGOGASTRODUODENOSCOPY N/A 12/13/2013   Procedure: ESOPHAGOGASTRODUODENOSCOPY (EGD);  Surgeon: Rogene Houston, MD;  Location: [facility];  Service: Endoscopy;  Laterality: N/A;  . Gastric Bypas  2000  . IR IMAGING GUIDED PORT INSERTION  11/14/2017    I have reviewed the social history and family history with the patient and they are unchanged from previous note.  ALLERGIES:  is allergic to codeine.  MEDICATIONS:  Current Outpatient Medications  Medication Sig Dispense Refill  . albuterol (PROVENTIL HFA) 108 (90 Base) MCG/ACT inhaler Inhale 2 puffs into the  lungs 2 (two) times daily. 0.006 g 1  . ALPRAZolam (XANAX) 0.25 MG tablet Take 1 tablet (0.25 mg total) by mouth at bedtime as needed for  anxiety or sleep. 30 tablet 0  . butalbital-acetaminophen-caffeine (FIORICET, ESGIC) 50-325-40 MG tablet Take 1-2 tablets by mouth every 6 (six) hours as needed for headache. 60 tablet 1  . CALCIUM-MAGNESIUM-ZINC PO Take 1 tablet by mouth daily.    Marland Kitchen dexamethasone (DECADRON) 4 MG tablet Take 2 tablets (8 mg total) by mouth 2 (two) times daily. Start the day before Taxotere, then take on day after chemo for 2 days 30 tablet 1  . dicyclomine (BENTYL) 20 MG tablet Take 1 tablet (20 mg total) by mouth every 6 (six) hours. 60 tablet 1  . FLUoxetine (PROZAC) 40 MG capsule Take 80 mg by mouth daily.    . furosemide (LASIX) 20 MG tablet Take 1 tablet (20 mg total) by mouth daily as needed. (Patient taking differently: Take 20 mg by mouth daily as needed for fluid or edema. ) 30 tablet 0  . gabapentin (NEURONTIN) 300 MG capsule Take 300 mg by mouth daily as needed (for pain).   4  . guaiFENesin (MUCINEX) 600 MG 12 hr tablet Take 1 tablet (600 mg total) by mouth 2 (two) times daily. 60 tablet 0  . HYDROcodone-acetaminophen (NORCO) 10-325 MG tablet Take 2 tablets by mouth every 6 (six) hours as needed (For pain.).     Marland Kitchen ipratropium-albuterol (DUONEB) 0.5-2.5 (3) MG/3ML SOLN Take 3 mLs by nebulization every 4 (four) hours as needed. (Patient taking differently: Take 3 mLs by nebulization every 4 (four) hours as needed (for shortness of breath). ) 360 mL 1  . lidocaine-prilocaine (EMLA) cream APPLY 1 APPLICATION TO PORT TOPICALLY AS NEEDED ONE HOUR PRIOR TO PORT ACCESS/CHEMO (Patient taking differently: Apply 1 application topically as needed (For port-a-cath.). ) 30 g 0  . magic mouthwash w/lidocaine SOLN Take 5 mLs by mouth 3 (three) times daily as needed for mouth pain. 240 mL 0  . Multiple Vitamin (MULTIVITAMIN) tablet Take 1 tablet by mouth daily.    Marland Kitchen nystatin (MYCOSTATIN) 100000 UNIT/ML suspension SWISH ORALLY THEN SPIT WITH 5 ML THREE TIMES DAILY (Patient taking differently: Use as directed 5 mLs in the  mouth or throat 3 (three) times daily. ) 240 mL 0  . omeprazole (PRILOSEC) 20 MG capsule Take 1 capsule (20 mg total) by mouth 2 (two) times daily before a meal. 60 capsule 0  . ondansetron (ZOFRAN) 8 MG tablet TAKE 1 TABLET BY MOUTH EVERY 8 HOURS AS NEEDED FOR NAUSEA OR VOMITING 30 tablet 0  . potassium chloride SA (K-DUR) 20 MEQ tablet Take 1 tablet by mouth once daily 30 tablet 0  . predniSONE (DELTASONE) 20 MG tablet Take 1 tablet (20 mg total) by mouth daily with breakfast. 30 tablet 0  . prochlorperazine (COMPAZINE) 10 MG tablet TAKE 1 TABLET BY MOUTH EVERY 6 HOURS AS NEEDED FOR NAUSEA FOR VOMITING 30 tablet 0  . rOPINIRole (REQUIP) 0.5 MG tablet Take 0.5 mg by mouth at bedtime as needed (For restless legs.).   11  . sucralfate (CARAFATE) 1 g tablet TAKE 1 TABLET(1 GRAM) BY MOUTH FOUR TIMES DAILY (Patient taking differently: Take 1 g by mouth 4 (four) times daily. ) 120 tablet 0  . TRELEGY ELLIPTA 100-62.5-25 MCG/INH AEPB Inhale 1 puff into the lungs daily.   5  . zolpidem (AMBIEN) 10 MG tablet Take 10 mg by mouth at bedtime.  No current facility-administered medications for this visit.    Facility-Administered Medications Ordered in Other Visits  Medication Dose Route Frequency Provider Last Rate Last Dose  . gemcitabine (GEMZAR) 2,000 mg in sodium chloride 0.9 % 250 mL chemo infusion  2,000 mg Intravenous Once Truitt Merle, MD 605 mL/hr at 10/26/18 1700 2,000 mg at 10/26/18 1700  . heparin lock flush 100 unit/mL  500 Units Intracatheter Once PRN Truitt Merle, MD      . sodium chloride flush (NS) 0.9 % injection 10 mL  10 mL Intracatheter PRN Truitt Merle, MD        PHYSICAL EXAMINATION: ECOG PERFORMANCE STATUS: 2 - Symptomatic, <50% confined to bed  Vitals:   10/26/18 1446  BP: 132/84  Pulse: 95  Resp: 18  Temp: 97.8 F (36.6 C)  SpO2: 95%   O2 sat 92% on 2 liters during ambulation   Filed Weights   10/26/18 1446  Weight: 178 lb 14.4 oz (81.1 kg)    GENERAL:alert, no  distress and comfortable SKIN: no rash. 2.5 cm substernal nodule EYES: sclera clear LUNGS: decreased, mild wheezing, normal breathing effort HEART: regular rate & rhythm, no lower extremity edema ABDOMEN:abdomen soft, non-tender and normal bowel sounds Musculoskeletal:no cyanosis of digits  NEURO: alert & oriented x 3 with fluent speech, no focal motor/sensory deficits PAC without erythema   LABORATORY DATA:  I have reviewed the data as listed CBC Latest Ref Rng & Units 10/26/2018 10/12/2018 10/05/2018  WBC 4.0 - 10.5 K/uL 9.2 5.4 16.7(H)  Hemoglobin 12.0 - 15.0 g/dL 9.9(L) 9.7(L) 10.6(L)  Hematocrit 36.0 - 46.0 % 32.6(L) 31.5(L) 34.7(L)  Platelets 150 - 400 K/uL 756(H) 230 351     CMP Latest Ref Rng & Units 10/26/2018 10/12/2018 10/05/2018  Glucose 70 - 99 mg/dL 75 102(H) 103(H)  BUN 6 - 20 mg/dL 4(L) 5(L) 9  Creatinine 0.44 - 1.00 mg/dL 0.71 0.66 0.66  Sodium 135 - 145 mmol/L 143 140 131(L)  Potassium 3.5 - 5.1 mmol/L 4.4 3.7 4.5  Chloride 98 - 111 mmol/L 107 102 98  CO2 22 - 32 mmol/L 27 29 25   Calcium 8.9 - 10.3 mg/dL 9.1 8.5(L) 8.2(L)  Total Protein 6.5 - 8.1 g/dL 6.1(L) 5.9(L) 6.1(L)  Total Bilirubin 0.3 - 1.2 mg/dL <0.2(L) 0.3 0.4  Alkaline Phos 38 - 126 U/L 343(H) 321(H) 174(H)  AST 15 - 41 U/L 121(H) 59(H) 19  ALT 0 - 44 U/L 88(H) 128(H) 34      RADIOGRAPHIC STUDIES: I have personally reviewed the radiological images as listed and agreed with the findings in the report. No results found.   ASSESSMENT & PLAN: Yvonne Lang a 53 y.o.femalewith   2. 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 -Diagnosed in 09/2017. Treated with chemo and radiation. She was initially onfirst linecarboplatin, paclitaxel, Atezolizumab and bevacizumab every 3weekss/p 6 cycles, with good partial response.Switched tomaintenance therapy withAvastinandAtezolizumabq3weeksstartedon 03/24/18. -Due to disease progression, chemoswitched  tosecond line alimta 500mg /m2 every 3 weekson 05/02/2018, shehas been tolerating well -CT CAP 5/1/20showing disease progression, Dr. Burr Medico recommended changing to third line treatmentdocetaxelandCyramza every 3 weeks; the goal is palliative -S/p cycle 2 on 08/17/18 -CTA noted disease progression, 3rd line treatment was discontinued -began 4th line gemcitabine day 1 and day 8 q21 days, s/p cycle 1 2. Acute hypoxic respiratory illness and PNA requiring hospitalization from 09/07/18 - 09/14/18  3.Dyspnea and dry cough-secondary to her COPD, right lung cancer and radiation changescontinuebronchodilator 4. Frontal Headaches- improved, followed by Dr. Mickeal Skinner  5. Anxiety and Depression -Currently on Xanax andProzac; mood improved after cymbalta was switched to prozac. 6. Chronic back pain-previously on tramadol, better controlled on norco 2 tabs q6Hr.Stable overall 7. Low appetite, Nausea-Currently on Mirtazapine, omeprazole, ondansetron,prochlorperazine, and sucralfate. 8. Hypokalemia- on K replacement34meq daily 9.Transaminitis- likely related to chemo, slightly increased today  10.Goal of care discussion- full code 11. Right posterior hip pain 12. New left lung/chest and axilla pain, new as of 10/26/18 13. Unsteadiness/dizziness, new as of 10/26/18   Dispo: Yvonne Lang appears stable. She completed cycle 1 single agent gemcitabine on days 1 and 8. She tolerated well overall. Her respiratory status is overall stable; she is not tolerating prednisone taper, has increased dyspnea and cough when she does not take it. She will continue low dose 5 mg every other day. She developed new left side pain deep in lung/chest and axilla. O2 sat 92-96% on 2 L during ambulation. She is not hypoxic. Could be related to malignancy. Substernal nodule is 2.5 cm, slightly larger. She also developed unsteady balance twice in the last week. Denies headaches, vision changes. Will monitor her closely.  If she has new or worsening neuro status changes, will image her brain. Plan to restage with CT CAP on 11/13/18. Labs reviewed today, Plt 756, could be reactive thrombocytosis related to her cancer. LFTs increased. Overall labs adequate for treatment. She will return for f/u and day 8 gemcitabine in 1 week. The patient was seen with Dr. Burr Medico.   Yvonne Lang is realistic about her disease, she knows she has aggressive cancer that is not responding well to treatment. She has her end of life wishes in place. She plans to review her affairs with her sister and family soon. We had a brief discussion about philosophy of palliative care vs hospice programs. She wants to continue cancer treatment as long as she is able, but in the end, she does not want to suffer.   All questions were answered. The patient knows to call the clinic with any problems, questions or concerns. No barriers to learning was detected.  Orders Placed This Encounter  Procedures  . CT Abdomen Pelvis W Contrast    Standing Status:   Future    Standing Expiration Date:   10/26/2019    Order Specific Question:   If indicated for the ordered procedure, I authorize the administration of contrast media per Radiology protocol    Answer:   Yes    Order Specific Question:   Is patient pregnant?    Answer:   No    Order Specific Question:   Preferred imaging location?    Answer:   [facility]    Order Specific Question:   Is Oral Contrast requested for this exam?    Answer:   Yes, Per Radiology protocol    Order Specific Question:   Radiology Contrast Protocol - do NOT remove file path    Answer:   \\[email or web address]  . CT Chest W Contrast    Standing Status:   Future    Standing Expiration Date:   10/26/2019    Order Specific Question:   If indicated for the ordered procedure, I authorize the administration of contrast media per Radiology protocol    Answer:   Yes    Order Specific Question:   Is patient  pregnant?    Answer:   No    Order Specific Question:   Preferred imaging location?    Answer:   [facility]  Order Specific Question:   Radiology Contrast Protocol - do NOT remove file path    Answer:   \\[email or web address]      Alla Feeling, NP 10/26/18   Addendum  I have seen the patient, examined her. I agree with the assessment and and plan and have edited the notes.   Yvonne Lang is clinically stable, has cut down prednisone to 5mg  daily but struggle to wean it off, will continue at current low dose. She also developed left side chest pain, her substernal subcutaneous metastasis has also slightly increased.  She has only received 2 doses of gemcitabine, lab reviewed, adequate for treatment, will proceed gemcitabine today and next week, and repeat scan in 2 to 3 weeks.  Truitt Merle  10/26/2018

## 2018-10-28 ENCOUNTER — Encounter: Payer: Self-pay | Admitting: Nurse Practitioner

## 2018-10-31 NOTE — Progress Notes (Signed)
[place]   [phone] [phone]   Clinic Follow up Note   Patient Care Team: Redmond School, MD as PCP - General (Internal Medicine) 11/01/2018  CHIEF COMPLAINT: f/u metastatic NSCLC  SUMMARY OF ONCOLOGIC HISTORY: Oncology History Overview Note  Cancer Staging Metastatic non-small cell lung cancer [facility]) Staging form: Lung, AJCC 8th Edition - Clinical stage from 10/17/2017: Stage IV (cT3, cN1, cM1b) - Signed by Truitt Merle, MD on 10/17/2017     Metastatic non-small cell lung cancer ([place])  09/20/2017 Imaging   US Breast Left 09/20/17  IMPRESSION Indeterminte palpable mass measuring 3.2x1.7x2.8 cm  inferior medial to the inframammary fold of the left breast.     09/20/2017 Initial Biopsy   Diagnosis 09/20/17 Soft Tissue Needle Core Biopsy, inferior medial to IMF - ADENOCARCINOMA. - SEE MICROSCOPIC DESCRIPTION.  Microscopic Comment Immunohistochemistry will be performed and reported as an addendum. (JDP:ah 09/23/17) ADDENDUM: Immunohistochemistry shows the tumor is strongly positive with cytokeratin AE1/AE3, cytokeratin 7 and shows moderate weak positivity with CDX-2. The tumor is negative with estrogen receptor, progesterone receptor, GCDFP, GATA-3, Napsin A, TTF-1, WT-1 and cytokeratin 20. The immunophenotype is consistent with metastatic carcinoma. The combination of CDX-2 and cytokeratin 7 positivity raises the possibility of an upper gastrointestinal primary. Clinical correlation is essential.   10/06/2017 Initial Diagnosis   Metastatic adenocarcinoma involving soft tissue with unknown primary site [facility])   10/10/2017 Procedure   Upper Endoscopy by Dr. Silverio Decamp 10/10/17  IMPRESSION - Normal esophagus. - Z-line regular, 35 cm from the incisors. - Gastric bypass with a normal-sized pouch and intact staple line. Gastrojejunal anastomosis characterized by healthy appearing mucosa. - No specimens collected.   10/12/2017 Imaging   CT CAP WO  Contrast 10/12/17  IMPRESSION: 7 cm central right lung mass involving the right hilum, with postobstructive collapse of the right middle lobe, highly suspicious for primary bronchogenic carcinoma. 5 cm masslike opacity in the superior segment of the right lower lobe may represent carcinoma or postobstructive pneumonitis. 3.3 cm soft tissue mass in the lower anterior chest wall soft tissues, suspicious for metastatic disease. No evidence of abdominal or pelvic metastatic disease.    10/14/2017 Imaging   MRI Brain 10/14/17  IMPRESSION: 1. No metastatic disease or acute intracranial abnormality. Normal MRI appearance of the brain. 2. 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     10/17/2017 Cancer Staging   Staging form: Lung, AJCC 8th Edition - Clinical stage from 10/17/2017: Stage IV (cT3, cN1, cM1b) - Signed by Truitt Merle, MD on 10/17/2017   10/24/2017 - 11/04/2017 Radiation Therapy    The Right lung mass was treated to 30 Gy in 10 fractions of 3 Gy   11/09/2017 - 03/24/2018 Chemotherapy   -first line chemo with carboplatin, paclitaxel, Atezolizumab and bevacizumab every 3weeks starting 11/08/17.  -Switched to maintenance therapy with avastin and atezolizumab q3weeks on 03/24/18   01/10/2018 Imaging   01/10/2018 CT CA IMPRESSION: 1. Response to therapy of central right lower lobe lung mass. Re-expansion of the right middle lobe with significantly improved right lower lobe postobstructive pneumonitis. Residual geographic airspace and ground-glass opacity within the medial right lung, primarily favored to be radiation induced. 2. Near complete resolution of presternal soft tissue mass. 3. New right adrenal nodule since 10/11/2017, suspicious for metastatic disease. 4. Right-sided Port-A-Cath with probable nonocclusive thrombus along the catheter within the right brachiocephalic vein. 5. Age advanced coronary artery atherosclerosis. Recommend assessment of coronary risk  factors and consideration of medical therapy. 6. Aortic  atherosclerosis (ICD10-I70.0) and emphysema (ICD10-J43.9).   03/10/2018 Imaging   CT CAP W Contrast 03/10/18 IMPRESSION: 1. Radiation changes involving the right paramediastinal lung and right hilum but no findings suspicious for residual or recurrent tumor. 2. No evidence of pulmonary metastatic disease. Stable emphysematous changes and areas of pulmonary scarring. 3. Stable 11 mm right adrenal gland nodule. 4. No findings for metastatic disease involving the liver or bony structures.   03/24/2018 - 04/24/2018 Chemotherapy   The patient had bevacizumab (AVASTIN) 1,300 mg in sodium chloride 0.9 % 100 mL chemo infusion, 15.5 mg/kg = 1,275 mg, Intravenous,  Once, 1 of 4 cycles Administration: 1,300 mg (03/24/2018) atezolizumab (TECENTRIQ) 1,200 mg in sodium chloride 0.9 % 250 mL chemo infusion, 1,200 mg, Intravenous, Once, 1 of 4 cycles Administration: 1,200 mg (03/24/2018)  for chemotherapy treatment.    04/11/2018 Imaging   CT ANGIO CHEST PE W OR WO CONTRAST  IMPRESSION: 1. No evidence of acute pulmonary embolism. 2. Progressive radiation changes in the right perihilar region. Cavitary retro hilar mass and right paratracheal lymph node have enlarged, suspicious for local recurrence/disease progression. 3. Enlarging right adrenal nodule suspicious for metastatic disease. 4. New right pleural effusion with increased atelectasis at both lung bases. 5. These results will be called to the ordering clinician or representative by the Radiology Department at the imaging location.    04/21/2018 Imaging   NUCLEAR MEDICINE PET SKULL BASE TO THIGH IMPRESSION: 1. Marked hypermetabolism in the amorphous soft tissue involving the right hilum. This represents the central component of the apparent radiation scarring. 2. Hypermetabolic mediastinal nodal metastases with nodal metastases identified in the anterior right juxta diaphragmatic fat.  3. Hypermetabolic right adrenal metastasis. 4. Interval progression of loculated right pleural effusion.    05/02/2018 - 07/25/2018 Chemotherapy   Second line Alimta 500mg /m2 every 3 weeks, started 05/02/2018. D/c due to disease pregression after 4 cycles     07/21/2018 Imaging   CT CAP 07/21/18  IMPRESSION: 1. Enlarging right adrenal metastatic lesion a mildly enlarging mediastinal adenopathy compatible with mild progression of disease. No new metastatic foci are identified. 2. Evolutionary findings along the right perihilar/paramediastinal radiation fibrosis including some improvement in aeration in the right upper lobe, but enlargement of the cavitary/centrally necrotic region in the superior segment right lower lobe, which is gas-filled and which probably connects to the otherwise focally occluded bronchus intermedius. 3. Other imaging findings of potential clinical significance: Probable left anterior descending coronary atherosclerosis. Mild to moderate intrahepatic biliary dilatation mild extrahepatic biliary dilatation, much of which may be a physiologic response to cholecystectomy. Prominent stool throughout the colon favors constipation. Aortic Atherosclerosis (ICD10-I70.0).   07/26/2018 - 08/17/2018 Chemotherapy   Third-line docetaxel and cyramza q3 weeks on 07/26/18. D/c after cycle 2 due to PNA and mild disease progression   09/07/2018 Imaging   CT Angio Chest 09/07/18  IMPRESSION: 1. Detection of pulmonary emboli is severely limited by motion artifact and suboptimal contrast bolus timing. Given this limitation, no PE was identified. The pulmonary arteries are dilated which can be seen in patients with elevated pulmonary artery pressures. 2. Diffuse ground-glass airspace opacities involving the right upper lobe, left upper lobe, and left lower lobe. Findings are concerning for multifocal pneumonia (viral or bacterial). 3. Persistent ill-defined soft tissue in the right  perihilar region with multiple pathologically enlarged mediastinal and hilar lymph nodes. There is a growing air-filled cavitary area within the right perihilar region which may be related to post treatment changes. 4. Small right-sided pleural effusion.  5. Growing subcutaneous soft tissue nodule in the anterior midline left chest wall as detailed above. This could represent a metastatic deposit. Again noted is a right adrenal metastatic lesion as detailed above.   Aortic Atherosclerosis (ICD10-I70.0).   09/07/2018 - 09/14/2018 Hospital Admission   Admit date: 09/07/18 Admission diagnosis: COPD exacerbation, PNA Additional comments: After 3 cycles of third-line chemo she was admitted for COPD and PNA. She was placed on oxygen and treated with antibotics.    10/05/2018 -  Chemotherapy   Fourth-line Weekly Gemcitabine starting 10/05/18   10/11/2018 Imaging   CT AP W Contrast  IMPRESSION: 1. Enlarging right adrenal gland metastatic lesion. 2. Enlarging subcutaneous soft tissue lesions in the lower anterior chest wall and the anterior abdominal wall. 3. New soft tissue lesion noted in the lower aspect of the right ischial rectal fossa. 4. Stable intra and extrahepatic biliary dilatation. No findings for hepatic metastatic disease. 5. No acute abdominal/pelvic findings or lymphadenopathy. 6. No definite bone lesions are identified. Specifically I do not see a definite cause for the patient's right hip pain. MRI would be suggested for further evaluation.     CURRENT THERAPY: Fourth-line weekly Gemcitabine, 2 weeks on and one-week off, starting7/16/20  INTERVAL HISTORY: Ms. Peyton returns for follow up and treatment as scheduled. She completed cycle 2 day 1 on 10/26/18. She is doing well. She feels less lethargic. Has ambulated short distances at home without oxygen lately. She continues to have cough and dyspnea, but stable. On 5 mg prednisone daily. She continues to have moderate to  severe pain at the substernal nodule and under left ribs. She takes hydrocodone q6. Pain is 3-4/10 after pain medicine, takes 25 minutes to work. Pain is 8/10 without meds and in the morning after a night wihtout medicine. Appetite is fair. Denies fever, chills. Intermittent nausea is stable. Denies constipation or diarrhea. Denies chest pain. She has dried blood in her O2 nasal tubing at times, no epistaxis or other bleeding. Has not been dizzy lately, and only had 1 headache since last week.   MEDICAL HISTORY:  Past Medical History:  Diagnosis Date  . Anxiety   . Bronchitis   . COPD (chronic obstructive pulmonary disease) ([place])   . Depression   . GERD (gastroesophageal reflux disease)   . H/O gastric bypass    1999  . Irritable bowel syndrome   . Lung cancer [facility])     SURGICAL HISTORY: Past Surgical History:  Procedure Laterality Date  . BACK SURGERY    . CHOLECYSTECTOMY    . COLON SURGERY     for a kink in her colon and 12 inchs removed  . COLONOSCOPY N/A 12/13/2013   Procedure: COLONOSCOPY;  Surgeon: Rogene Houston, MD;  Location: [facility];  Service: Endoscopy;  Laterality: N/A;  200  . ESOPHAGOGASTRODUODENOSCOPY N/A 12/13/2013   Procedure: ESOPHAGOGASTRODUODENOSCOPY (EGD);  Surgeon: Rogene Houston, MD;  Location: [facility] ENDO SUITE;  Service: Endoscopy;  Laterality: N/A;  . Gastric Bypas  2000  . IR IMAGING GUIDED PORT INSERTION  11/14/2017    I have reviewed the social history and family history with the patient and they are unchanged from previous note.  ALLERGIES:  is allergic to codeine.  MEDICATIONS:  Current Outpatient Medications  Medication Sig Dispense Refill  . albuterol (PROVENTIL HFA) 108 (90 Base) MCG/ACT inhaler Inhale 2 puffs into the lungs 2 (two) times daily. 0.006 g 1  . ALPRAZolam (XANAX) 0.25 MG tablet Take 1 tablet (0.25 mg total)  by mouth at bedtime as needed for anxiety or sleep. 30 tablet 0  . butalbital-acetaminophen-caffeine (FIORICET, ESGIC)  50-325-40 MG tablet Take 1-2 tablets by mouth every 6 (six) hours as needed for headache. 60 tablet 1  . CALCIUM-MAGNESIUM-ZINC PO Take 1 tablet by mouth daily.    Marland Kitchen dexamethasone (DECADRON) 4 MG tablet Take 2 tablets (8 mg total) by mouth 2 (two) times daily. Start the day before Taxotere, then take on day after chemo for 2 days 30 tablet 1  . dicyclomine (BENTYL) 20 MG tablet Take 1 tablet (20 mg total) by mouth every 6 (six) hours. 60 tablet 1  . FLUoxetine (PROZAC) 40 MG capsule Take 80 mg by mouth daily.    . furosemide (LASIX) 20 MG tablet Take 1 tablet (20 mg total) by mouth daily as needed. (Patient taking differently: Take 20 mg by mouth daily as needed for fluid or edema. ) 30 tablet 0  . gabapentin (NEURONTIN) 300 MG capsule Take 300 mg by mouth daily as needed (for pain).   4  . guaiFENesin (MUCINEX) 600 MG 12 hr tablet Take 1 tablet (600 mg total) by mouth 2 (two) times daily. 60 tablet 0  . HYDROcodone-acetaminophen (NORCO) 10-325 MG tablet Take 2 tablets by mouth every 6 (six) hours as needed (For pain.).     Marland Kitchen ipratropium-albuterol (DUONEB) 0.5-2.5 (3) MG/3ML SOLN Take 3 mLs by nebulization every 4 (four) hours as needed. (Patient taking differently: Take 3 mLs by nebulization every 4 (four) hours as needed (for shortness of breath). ) 360 mL 1  . lidocaine-prilocaine (EMLA) cream APPLY 1 APPLICATION TO PORT TOPICALLY AS NEEDED ONE HOUR PRIOR TO PORT ACCESS/CHEMO (Patient taking differently: Apply 1 application topically as needed (For port-a-cath.). ) 30 g 0  . magic mouthwash w/lidocaine SOLN Take 5 mLs by mouth 3 (three) times daily as needed for mouth pain. 240 mL 0  . Multiple Vitamin (MULTIVITAMIN) tablet Take 1 tablet by mouth daily.    Marland Kitchen nystatin (MYCOSTATIN) 100000 UNIT/ML suspension SWISH ORALLY THEN SPIT WITH 5 ML THREE TIMES DAILY (Patient taking differently: Use as directed 5 mLs in the mouth or throat 3 (three) times daily. ) 240 mL 0  . omeprazole (PRILOSEC) 20 MG capsule  Take 1 capsule (20 mg total) by mouth 2 (two) times daily before a meal. 60 capsule 0  . ondansetron (ZOFRAN) 8 MG tablet TAKE 1 TABLET BY MOUTH EVERY 8 HOURS AS NEEDED FOR NAUSEA OR VOMITING 30 tablet 0  . potassium chloride SA (K-DUR) 20 MEQ tablet Take 1 tablet by mouth once daily 30 tablet 0  . predniSONE (DELTASONE) 20 MG tablet Take 1 tablet (20 mg total) by mouth daily with breakfast. 30 tablet 0  . prochlorperazine (COMPAZINE) 10 MG tablet TAKE 1 TABLET BY MOUTH EVERY 6 HOURS AS NEEDED FOR NAUSEA FOR VOMITING 30 tablet 0  . rOPINIRole (REQUIP) 0.5 MG tablet Take 0.5 mg by mouth at bedtime as needed (For restless legs.).   11  . sucralfate (CARAFATE) 1 g tablet TAKE 1 TABLET(1 GRAM) BY MOUTH FOUR TIMES DAILY (Patient taking differently: Take 1 g by mouth 4 (four) times daily. ) 120 tablet 0  . TRELEGY ELLIPTA 100-62.5-25 MCG/INH AEPB Inhale 1 puff into the lungs daily.   5  . zolpidem (AMBIEN) 10 MG tablet Take 10 mg by mouth at bedtime.      No current facility-administered medications for this visit.    Facility-Administered Medications Ordered in Other Visits  Medication Dose  Route Frequency Provider Last Rate Last Dose  . gemcitabine (GEMZAR) 2,014 mg in sodium chloride 0.9 % 250 mL chemo infusion  1,000 mg/m2 (Order-Specific) Intravenous Once Truitt Merle, MD      . heparin lock flush 100 unit/mL  500 Units Intracatheter Once PRN Truitt Merle, MD      . sodium chloride flush (NS) 0.9 % injection 10 mL  10 mL Intracatheter PRN Truitt Merle, MD        PHYSICAL EXAMINATION: ECOG PERFORMANCE STATUS: 2 - Symptomatic, <50% confined to bed  Vitals:   11/01/18 1500  BP: 136/84  Pulse: 100  Resp: 18  Temp: 98 F (36.7 C)  SpO2: 97%   Filed Weights   11/01/18 1500  Weight: 179 lb 6.4 oz (81.4 kg)    GENERAL:alert, no distress and comfortable SKIN: no rash  EYES: sclera clear LUNGS: distant with normal breathing effort. On supplemental oxygen HEART: tachycardic, regular rhythm and no  lower extremity edema. Substernal nodule 2.5 x3 cm, tender Musculoskeletal:no cyanosis of digits and no clubbing  NEURO: alert & oriented x 3 with fluent speech, normal gait PAC without erythema   LABORATORY DATA:  I have reviewed the data as listed CBC Latest Ref Rng & Units 11/01/2018 10/26/2018 10/12/2018  WBC 4.0 - 10.5 K/uL 3.3(L) 9.2 5.4  Hemoglobin 12.0 - 15.0 g/dL 9.5(L) 9.9(L) 9.7(L)  Hematocrit 36.0 - 46.0 % 30.4(L) 32.6(L) 31.5(L)  Platelets 150 - 400 K/uL 501(H) 756(H) 230     CMP Latest Ref Rng & Units 11/01/2018 10/26/2018 10/12/2018  Glucose 70 - 99 mg/dL 116(H) 75 102(H)  BUN 6 - 20 mg/dL 6 4(L) 5(L)  Creatinine 0.44 - 1.00 mg/dL 0.74 0.71 0.66  Sodium 135 - 145 mmol/L 137 143 140  Potassium 3.5 - 5.1 mmol/L 4.2 4.4 3.7  Chloride 98 - 111 mmol/L 101 107 102  CO2 22 - 32 mmol/L 28 27 29   Calcium 8.9 - 10.3 mg/dL 8.3(L) 9.1 8.5(L)  Total Protein 6.5 - 8.1 g/dL 5.9(L) 6.1(L) 5.9(L)  Total Bilirubin 0.3 - 1.2 mg/dL 0.3 <0.2(L) 0.3  Alkaline Phos 38 - 126 U/L 299(H) 343(H) 321(H)  AST 15 - 41 U/L 85(H) 121(H) 59(H)  ALT 0 - 44 U/L 64(H) 88(H) 128(H)      RADIOGRAPHIC STUDIES: I have personally reviewed the radiological images as listed and agreed with the findings in the report. No results found.   ASSESSMENT & PLAN: Yvonne Lang a 53 y.o.femalewith   2. 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 -Diagnosed in 09/2017. Treated with chemo and radiation. She was initially onfirst linecarboplatin, paclitaxel, Atezolizumab and bevacizumab every 3weekss/p 6 cycles, with good partial response.Switched tomaintenance therapy withAvastinandAtezolizumabq3weeksstartedon 03/24/18. -Due to disease progression, chemoswitched tosecond line alimta 500mg /m2 every 3 weekson 05/02/2018, shehas been tolerating well -CT CAP 5/1/20showing disease progression, Dr. Burr Medico recommended changing to third line  treatmentdocetaxelandCyramza every 3 weeks; the goal is palliative -S/p cycle 2 on 08/17/18 -CTA noted disease progression, 3rd line treatment was discontinued -began 4th line gemcitabine day 1 and day 8 q21 days, s/p cycle 2 day 1 2.Acute hypoxic respiratory illness and PNA requiring hospitalization from 09/07/18 - 09/14/18 3.Dyspnea and dry cough-secondary to her COPD, right lung cancer and radiation changescontinuebronchodilator 4. Frontal Headaches-improved, followed by Dr. Mickeal Skinner 5. Anxiety and Depression -Currently on Xanax andProzac; mood improved after cymbalta was switched to prozac. 6. Chronic back pain-previously on tramadol, better controlled on norco 2 tabsq6Hr.Stableoverall 7. Low appetite, Nausea-Currently on Mirtazapine, omeprazole, ondansetron,prochlorperazine,  and sucralfate. 8. Hypokalemia- on K replacement84meq daily 9.Transaminitis- likely related to chemo, slightly increased today  10.Goal of care discussion- full code 11. Right posterior hip pain 12. New left lung/chest and axilla pain, new as of 10/26/18 13. Unsteadiness/dizziness, new as of 10/26/18   Dispo: Ms. Gottsch appears stable. She completed cycle 2 day 1 gemcitabine, tolerated well overall. She has increased pain in her substernal nodule and in left side behind ribs. She takes norco q6, which helps but pain is moderate to severe in the morning. We discussed rationale for long acting pain medication, she agrees. I recommend oxycodone 10 mg q12. She can use norco PRN for breakthrough. We reviewed potential side effects, such as drowsiness, constipation. She will monitor her BM. She continues prednisone, respiratory status stable to slightly improved. Will restage after cycle 2. Labs reviewed. Proceed with cycle 2 day 8 today. F/u in 2 weeks with CT results. I reviewed the plan with Dr. Burr Medico.  All questions were answered. The patient knows to call the clinic with any problems, questions or  concerns. No barriers to learning was detected. I spent 20 minutes counseling the patient face to face. The total time spent in the appointment was 25 minutes and more than 50% was on counseling and review of test results     Alla Feeling, NP 11/01/18

## 2018-11-01 ENCOUNTER — Other Ambulatory Visit: Payer: Self-pay | Admitting: Hematology

## 2018-11-01 ENCOUNTER — Encounter: Payer: Self-pay | Admitting: Nurse Practitioner

## 2018-11-01 ENCOUNTER — Other Ambulatory Visit: Payer: Self-pay

## 2018-11-01 ENCOUNTER — Telehealth: Payer: Self-pay | Admitting: Nurse Practitioner

## 2018-11-01 ENCOUNTER — Inpatient Hospital Stay: Payer: BC Managed Care – PPO

## 2018-11-01 ENCOUNTER — Inpatient Hospital Stay (HOSPITAL_BASED_OUTPATIENT_CLINIC_OR_DEPARTMENT_OTHER): Payer: BC Managed Care – PPO | Admitting: Nurse Practitioner

## 2018-11-01 VITALS — BP 136/84 | HR 100 | Temp 98.0°F | Resp 18 | Ht 67.0 in | Wt 179.4 lb

## 2018-11-01 DIAGNOSIS — J441 Chronic obstructive pulmonary disease with (acute) exacerbation: Secondary | ICD-10-CM | POA: Diagnosis not present

## 2018-11-01 DIAGNOSIS — C7971 Secondary malignant neoplasm of right adrenal gland: Secondary | ICD-10-CM | POA: Diagnosis not present

## 2018-11-01 DIAGNOSIS — C7989 Secondary malignant neoplasm of other specified sites: Secondary | ICD-10-CM | POA: Diagnosis not present

## 2018-11-01 DIAGNOSIS — Z95828 Presence of other vascular implants and grafts: Secondary | ICD-10-CM

## 2018-11-01 DIAGNOSIS — C3490 Malignant neoplasm of unspecified part of unspecified bronchus or lung: Secondary | ICD-10-CM

## 2018-11-01 DIAGNOSIS — R42 Dizziness and giddiness: Secondary | ICD-10-CM | POA: Diagnosis not present

## 2018-11-01 DIAGNOSIS — C3431 Malignant neoplasm of lower lobe, right bronchus or lung: Secondary | ICD-10-CM | POA: Diagnosis not present

## 2018-11-01 DIAGNOSIS — R079 Chest pain, unspecified: Secondary | ICD-10-CM | POA: Diagnosis not present

## 2018-11-01 DIAGNOSIS — J189 Pneumonia, unspecified organism: Secondary | ICD-10-CM | POA: Diagnosis not present

## 2018-11-01 DIAGNOSIS — I2510 Atherosclerotic heart disease of native coronary artery without angina pectoris: Secondary | ICD-10-CM | POA: Diagnosis not present

## 2018-11-01 DIAGNOSIS — R51 Headache: Secondary | ICD-10-CM | POA: Diagnosis not present

## 2018-11-01 DIAGNOSIS — M25551 Pain in right hip: Secondary | ICD-10-CM | POA: Diagnosis not present

## 2018-11-01 DIAGNOSIS — R000 Tachycardia, unspecified: Secondary | ICD-10-CM | POA: Diagnosis not present

## 2018-11-01 DIAGNOSIS — R110 Nausea: Secondary | ICD-10-CM | POA: Diagnosis not present

## 2018-11-01 DIAGNOSIS — C787 Secondary malignant neoplasm of liver and intrahepatic bile duct: Secondary | ICD-10-CM | POA: Diagnosis not present

## 2018-11-01 DIAGNOSIS — Z5111 Encounter for antineoplastic chemotherapy: Secondary | ICD-10-CM | POA: Diagnosis not present

## 2018-11-01 DIAGNOSIS — I700 Atherosclerosis of aorta: Secondary | ICD-10-CM | POA: Diagnosis not present

## 2018-11-01 DIAGNOSIS — M79629 Pain in unspecified upper arm: Secondary | ICD-10-CM | POA: Diagnosis not present

## 2018-11-01 LAB — CMP (CANCER CENTER ONLY)
ALT: 64 U/L — ABNORMAL HIGH (ref 0–44)
AST: 85 U/L — ABNORMAL HIGH (ref 15–41)
Albumin: 2.8 g/dL — ABNORMAL LOW (ref 3.5–5.0)
Alkaline Phosphatase: 299 U/L — ABNORMAL HIGH (ref 38–126)
Anion gap: 8 (ref 5–15)
BUN: 6 mg/dL (ref 6–20)
CO2: 28 mmol/L (ref 22–32)
Calcium: 8.3 mg/dL — ABNORMAL LOW (ref 8.9–10.3)
Chloride: 101 mmol/L (ref 98–111)
Creatinine: 0.74 mg/dL (ref 0.44–1.00)
GFR, Est AFR Am: >60 mL/min (ref >60)
GFR, Estimated: >60 mL/min (ref >60)
Glucose, Bld: 116 mg/dL — ABNORMAL HIGH (ref 70–99)
Potassium: 4.2 mmol/L (ref 3.5–5.1)
Sodium: 137 mmol/L (ref 135–145)
Total Bilirubin: 0.3 mg/dL (ref 0.3–1.2)
Total Protein: 5.9 g/dL — ABNORMAL LOW (ref 6.5–8.1)

## 2018-11-01 LAB — CBC WITH DIFFERENTIAL (CANCER CENTER ONLY)
Abs Immature Granulocytes: 0.04 10*3/uL (ref 0.00–0.07)
Basophils Absolute: 0 10*3/uL (ref 0.0–0.1)
Basophils Relative: 1 %
Eosinophils Absolute: 0 10*3/uL (ref 0.0–0.5)
Eosinophils Relative: 0 %
HCT: 30.4 % — ABNORMAL LOW (ref 36.0–46.0)
Hemoglobin: 9.5 g/dL — ABNORMAL LOW (ref 12.0–15.0)
Immature Granulocytes: 1 %
Lymphocytes Relative: 26 %
Lymphs Abs: 0.8 10*3/uL (ref 0.7–4.0)
MCH: 30.6 pg (ref 26.0–34.0)
MCHC: 31.3 g/dL (ref 30.0–36.0)
MCV: 98.1 fL (ref 80.0–100.0)
Monocytes Absolute: 0.3 10*3/uL (ref 0.1–1.0)
Monocytes Relative: 9 %
Neutro Abs: 2.1 10*3/uL (ref 1.7–7.7)
Neutrophils Relative %: 63 %
Platelet Count: 501 10*3/uL — ABNORMAL HIGH (ref 150–400)
RBC: 3.1 MIL/uL — ABNORMAL LOW (ref 3.87–5.11)
RDW: 16.8 % — ABNORMAL HIGH (ref 11.5–15.5)
WBC Count: 3.3 10*3/uL — ABNORMAL LOW (ref 4.0–10.5)
nRBC: 0.6 % — ABNORMAL HIGH (ref 0.0–0.2)

## 2018-11-01 MED ORDER — SODIUM CHLORIDE 0.9 % IV SOLN
1000.0000 mg/m2 | Freq: Once | INTRAVENOUS | Status: AC
  Administered 2018-11-01: 2014 mg via INTRAVENOUS
  Filled 2018-11-01: qty 52.97

## 2018-11-01 MED ORDER — SODIUM CHLORIDE 0.9% FLUSH
10.0000 mL | INTRAVENOUS | Status: DC | PRN
  Administered 2018-11-01: 10 mL
  Filled 2018-11-01: qty 10

## 2018-11-01 MED ORDER — SODIUM CHLORIDE 0.9 % IV SOLN
Freq: Once | INTRAVENOUS | Status: AC
  Administered 2018-11-01: 16:00:00 via INTRAVENOUS
  Filled 2018-11-01: qty 250

## 2018-11-01 MED ORDER — PROCHLORPERAZINE MALEATE 10 MG PO TABS
ORAL_TABLET | ORAL | Status: AC
  Filled 2018-11-01: qty 1

## 2018-11-01 MED ORDER — PROCHLORPERAZINE MALEATE 10 MG PO TABS
10.0000 mg | ORAL_TABLET | Freq: Once | ORAL | Status: AC
  Administered 2018-11-01: 10 mg via ORAL

## 2018-11-01 MED ORDER — OXYCODONE HCL ER 10 MG PO T12A: 10.0000 mg | EXTENDED_RELEASE_TABLET | Freq: Two times a day (BID) | ORAL | 0 refills | Status: DC

## 2018-11-01 MED ORDER — HEPARIN SOD (PORK) LOCK FLUSH 100 UNIT/ML IV SOLN
500.0000 [IU] | Freq: Once | INTRAVENOUS | Status: AC | PRN
  Administered 2018-11-01: 500 [IU]
  Filled 2018-11-01: qty 5

## 2018-11-01 NOTE — Patient Instructions (Signed)
[facility] Discharge Instructions for Patients Receiving Chemotherapy  Today you received the following chemotherapy agents:  Gemcitabine  To help prevent nausea and vomiting after your treatment, we encourage you to take your nausea medication as prescribed.    If you develop nausea and vomiting that is not controlled by your nausea medication, call the clinic.   BELOW ARE SYMPTOMS THAT SHOULD BE REPORTED IMMEDIATELY:  *FEVER GREATER THAN 100.5 F  *CHILLS WITH OR WITHOUT FEVER  NAUSEA AND VOMITING THAT IS NOT CONTROLLED WITH YOUR NAUSEA MEDICATION  *UNUSUAL SHORTNESS OF BREATH  *UNUSUAL BRUISING OR BLEEDING  TENDERNESS IN MOUTH AND THROAT WITH OR WITHOUT PRESENCE OF ULCERS  *URINARY PROBLEMS  *BOWEL PROBLEMS  UNUSUAL RASH Items with * indicate a potential emergency and should be followed up as soon as possible.  Feel free to call the clinic should you have any questions or concerns. The clinic phone number is [phone].  Please show the CHEMO ALERT CARD at check-in to the Emergency Department and triage nurse.   

## 2018-11-01 NOTE — Telephone Encounter (Signed)
Scheduled appt per 8/12 los.

## 2018-11-01 NOTE — Progress Notes (Signed)
Per Regan Rakers NP ok to tx with elevated AST/ALT

## 2018-11-02 ENCOUNTER — Other Ambulatory Visit: Payer: BC Managed Care – PPO

## 2018-11-02 ENCOUNTER — Encounter: Payer: Self-pay | Admitting: Nurse Practitioner

## 2018-11-02 ENCOUNTER — Ambulatory Visit: Payer: BC Managed Care – PPO | Admitting: Nurse Practitioner

## 2018-11-02 ENCOUNTER — Ambulatory Visit: Payer: BC Managed Care – PPO

## 2018-11-03 ENCOUNTER — Telehealth: Payer: Self-pay

## 2018-11-03 NOTE — Telephone Encounter (Signed)
Received TC from [facility] in regard to Cira Rue NP recent request for patients oxycodone 10 mg. They wanted to know if Lacie wanted IR or ER? Because if it was ER then she would need to fill out a different form. They also stated that they would be faxing over form. Sent message to [facility] NP so that she is aware of their clarification request.   [phone]

## 2018-11-05 ENCOUNTER — Encounter: Payer: Self-pay | Admitting: Nurse Practitioner

## 2018-11-06 ENCOUNTER — Other Ambulatory Visit: Payer: Self-pay | Admitting: *Deleted

## 2018-11-06 DIAGNOSIS — J9601 Acute respiratory failure with hypoxia: Secondary | ICD-10-CM

## 2018-11-06 MED ORDER — PREDNISONE 10 MG PO TABS: ORAL_TABLET | ORAL | 0 refills | Status: DC

## 2018-11-06 NOTE — Telephone Encounter (Signed)
Received phone call from Dennis with [employer] regarding rx for oxycontin.  Per Judson Roch prior auth form faxed to [phone].  Returned call to [facility] and requested fax be sent to [phone].  Call back number provided for additional questions.  Pt informed.

## 2018-11-13 ENCOUNTER — Ambulatory Visit (HOSPITAL_COMMUNITY)
Admission: RE | Admit: 2018-11-13 | Discharge: 2018-11-13 | Disposition: A | Discharge status: Home / Self Care | Payer: BC Managed Care – PPO | Source: Ambulatory Visit | Attending: Nurse Practitioner | Admitting: Nurse Practitioner

## 2018-11-13 ENCOUNTER — Other Ambulatory Visit: Payer: Self-pay

## 2018-11-13 DIAGNOSIS — C3491 Malignant neoplasm of unspecified part of right bronchus or lung: Secondary | ICD-10-CM | POA: Diagnosis not present

## 2018-11-13 DIAGNOSIS — C7989 Secondary malignant neoplasm of other specified sites: Secondary | ICD-10-CM | POA: Diagnosis not present

## 2018-11-13 DIAGNOSIS — C3490 Malignant neoplasm of unspecified part of unspecified bronchus or lung: Secondary | ICD-10-CM | POA: Diagnosis not present

## 2018-11-13 MED ORDER — HEPARIN SOD (PORK) LOCK FLUSH 100 UNIT/ML IV SOLN
INTRAVENOUS | Status: AC
  Filled 2018-11-13: qty 5

## 2018-11-13 MED ORDER — SODIUM CHLORIDE (PF) 0.9 % IJ SOLN
INTRAMUSCULAR | Status: AC
  Filled 2018-11-13: qty 50

## 2018-11-13 MED ORDER — IOHEXOL 300 MG/ML  SOLN
100.0000 mL | Freq: Once | INTRAMUSCULAR | Status: AC | PRN
  Administered 2018-11-13: 100 mL via INTRAVENOUS

## 2018-11-13 MED ORDER — HEPARIN SOD (PORK) LOCK FLUSH 100 UNIT/ML IV SOLN
500.0000 [IU] | Freq: Once | INTRAVENOUS | Status: AC
  Administered 2018-11-13: 500 [IU] via INTRAVENOUS

## 2018-11-14 DIAGNOSIS — C3490 Malignant neoplasm of unspecified part of unspecified bronchus or lung: Secondary | ICD-10-CM | POA: Diagnosis not present

## 2018-11-16 ENCOUNTER — Inpatient Hospital Stay: Payer: BC Managed Care – PPO

## 2018-11-16 ENCOUNTER — Other Ambulatory Visit: Payer: Self-pay

## 2018-11-16 ENCOUNTER — Inpatient Hospital Stay (HOSPITAL_BASED_OUTPATIENT_CLINIC_OR_DEPARTMENT_OTHER): Payer: BC Managed Care – PPO | Admitting: Nurse Practitioner

## 2018-11-16 ENCOUNTER — Encounter: Payer: Self-pay | Admitting: Nurse Practitioner

## 2018-11-16 VITALS — BP 104/73 | HR 90 | Temp 98.4°F | Resp 17 | Ht 67.0 in | Wt 177.6 lb

## 2018-11-16 DIAGNOSIS — R079 Chest pain, unspecified: Secondary | ICD-10-CM | POA: Diagnosis not present

## 2018-11-16 DIAGNOSIS — R51 Headache: Secondary | ICD-10-CM | POA: Diagnosis not present

## 2018-11-16 DIAGNOSIS — C7971 Secondary malignant neoplasm of right adrenal gland: Secondary | ICD-10-CM | POA: Diagnosis not present

## 2018-11-16 DIAGNOSIS — J189 Pneumonia, unspecified organism: Secondary | ICD-10-CM | POA: Diagnosis not present

## 2018-11-16 DIAGNOSIS — J441 Chronic obstructive pulmonary disease with (acute) exacerbation: Secondary | ICD-10-CM | POA: Diagnosis not present

## 2018-11-16 DIAGNOSIS — C3490 Malignant neoplasm of unspecified part of unspecified bronchus or lung: Secondary | ICD-10-CM

## 2018-11-16 DIAGNOSIS — M79629 Pain in unspecified upper arm: Secondary | ICD-10-CM | POA: Diagnosis not present

## 2018-11-16 DIAGNOSIS — M25551 Pain in right hip: Secondary | ICD-10-CM | POA: Diagnosis not present

## 2018-11-16 DIAGNOSIS — C7989 Secondary malignant neoplasm of other specified sites: Secondary | ICD-10-CM | POA: Diagnosis not present

## 2018-11-16 DIAGNOSIS — Z5111 Encounter for antineoplastic chemotherapy: Secondary | ICD-10-CM | POA: Diagnosis not present

## 2018-11-16 DIAGNOSIS — I700 Atherosclerosis of aorta: Secondary | ICD-10-CM | POA: Diagnosis not present

## 2018-11-16 DIAGNOSIS — Z95828 Presence of other vascular implants and grafts: Secondary | ICD-10-CM

## 2018-11-16 DIAGNOSIS — I2510 Atherosclerotic heart disease of native coronary artery without angina pectoris: Secondary | ICD-10-CM | POA: Diagnosis not present

## 2018-11-16 DIAGNOSIS — R110 Nausea: Secondary | ICD-10-CM | POA: Diagnosis not present

## 2018-11-16 DIAGNOSIS — C3431 Malignant neoplasm of lower lobe, right bronchus or lung: Secondary | ICD-10-CM | POA: Diagnosis not present

## 2018-11-16 DIAGNOSIS — R000 Tachycardia, unspecified: Secondary | ICD-10-CM | POA: Diagnosis not present

## 2018-11-16 DIAGNOSIS — R42 Dizziness and giddiness: Secondary | ICD-10-CM | POA: Diagnosis not present

## 2018-11-16 DIAGNOSIS — C787 Secondary malignant neoplasm of liver and intrahepatic bile duct: Secondary | ICD-10-CM | POA: Diagnosis not present

## 2018-11-16 LAB — CBC WITH DIFFERENTIAL (CANCER CENTER ONLY)
Abs Immature Granulocytes: 0.05 10*3/uL (ref 0.00–0.07)
Basophils Absolute: 0 10*3/uL (ref 0.0–0.1)
Basophils Relative: 0 %
Eosinophils Absolute: 0.1 10*3/uL (ref 0.0–0.5)
Eosinophils Relative: 1 %
HCT: 31.4 % — ABNORMAL LOW (ref 36.0–46.0)
Hemoglobin: 9.7 g/dL — ABNORMAL LOW (ref 12.0–15.0)
Immature Granulocytes: 1 %
Lymphocytes Relative: 9 %
Lymphs Abs: 0.9 10*3/uL (ref 0.7–4.0)
MCH: 31 pg (ref 26.0–34.0)
MCHC: 30.9 g/dL (ref 30.0–36.0)
MCV: 100.3 fL — ABNORMAL HIGH (ref 80.0–100.0)
Monocytes Absolute: 1.3 10*3/uL — ABNORMAL HIGH (ref 0.1–1.0)
Monocytes Relative: 13 %
Neutro Abs: 7.6 10*3/uL (ref 1.7–7.7)
Neutrophils Relative %: 76 %
Platelet Count: 707 10*3/uL — ABNORMAL HIGH (ref 150–400)
RBC: 3.13 MIL/uL — ABNORMAL LOW (ref 3.87–5.11)
RDW: 20.2 % — ABNORMAL HIGH (ref 11.5–15.5)
WBC Count: 10 10*3/uL (ref 4.0–10.5)
nRBC: 0 % (ref 0.0–0.2)

## 2018-11-16 LAB — CMP (CANCER CENTER ONLY)
ALT: 23 U/L (ref 0–44)
AST: 21 U/L (ref 15–41)
Albumin: 2.8 g/dL — ABNORMAL LOW (ref 3.5–5.0)
Alkaline Phosphatase: 221 U/L — ABNORMAL HIGH (ref 38–126)
Anion gap: 8 (ref 5–15)
BUN: 7 mg/dL (ref 6–20)
CO2: 29 mmol/L (ref 22–32)
Calcium: 8.3 mg/dL — ABNORMAL LOW (ref 8.9–10.3)
Chloride: 103 mmol/L (ref 98–111)
Creatinine: 0.72 mg/dL (ref 0.44–1.00)
GFR, Est AFR Am: >60 mL/min (ref >60)
GFR, Estimated: >60 mL/min (ref >60)
Glucose, Bld: 111 mg/dL — ABNORMAL HIGH (ref 70–99)
Potassium: 5.3 mmol/L — ABNORMAL HIGH (ref 3.5–5.1)
Sodium: 140 mmol/L (ref 135–145)
Total Bilirubin: 0.3 mg/dL (ref 0.3–1.2)
Total Protein: 5.6 g/dL — ABNORMAL LOW (ref 6.5–8.1)

## 2018-11-16 MED ORDER — SODIUM CHLORIDE 0.9 % IV SOLN
Freq: Once | INTRAVENOUS | Status: AC
  Administered 2018-11-16: 16:00:00 via INTRAVENOUS
  Filled 2018-11-16: qty 250

## 2018-11-16 MED ORDER — ALTEPLASE 2 MG IJ SOLR
INTRAMUSCULAR | Status: AC
  Filled 2018-11-16: qty 2

## 2018-11-16 MED ORDER — PROCHLORPERAZINE MALEATE 10 MG PO TABS
10.0000 mg | ORAL_TABLET | Freq: Once | ORAL | Status: AC
  Administered 2018-11-16: 10 mg via ORAL

## 2018-11-16 MED ORDER — ALTEPLASE 2 MG IJ SOLR
2.0000 mg | Freq: Once | INTRAMUSCULAR | Status: AC | PRN
  Administered 2018-11-16: 15:00:00 2 mg
  Filled 2018-11-16: qty 2

## 2018-11-16 MED ORDER — PROCHLORPERAZINE MALEATE 10 MG PO TABS
ORAL_TABLET | ORAL | Status: AC
  Filled 2018-11-16: qty 1

## 2018-11-16 MED ORDER — SODIUM CHLORIDE 0.9% FLUSH
10.0000 mL | INTRAVENOUS | Status: DC | PRN
  Administered 2018-11-16: 17:00:00 10 mL
  Filled 2018-11-16: qty 10

## 2018-11-16 MED ORDER — SODIUM CHLORIDE 0.9 % IV SOLN
1000.0000 mg/m2 | Freq: Once | INTRAVENOUS | Status: AC
  Administered 2018-11-16: 2014 mg via INTRAVENOUS
  Filled 2018-11-16: qty 52.97

## 2018-11-16 MED ORDER — OXYCODONE HCL ER 20 MG PO T12A: 20.0000 mg | EXTENDED_RELEASE_TABLET | Freq: Two times a day (BID) | ORAL | 0 refills | Status: AC

## 2018-11-16 MED ORDER — SODIUM CHLORIDE 0.9% FLUSH
10.0000 mL | INTRAVENOUS | Status: DC | PRN
  Administered 2018-11-16: 15:00:00 10 mL
  Filled 2018-11-16: qty 10

## 2018-11-16 MED ORDER — HEPARIN SOD (PORK) LOCK FLUSH 100 UNIT/ML IV SOLN
500.0000 [IU] | Freq: Once | INTRAVENOUS | Status: AC | PRN
  Administered 2018-11-16: 500 [IU]
  Filled 2018-11-16: qty 5

## 2018-11-16 NOTE — Progress Notes (Signed)
CATHFLO given by Vista Lawman, RN sat 1453, blood drawn peripherally by Murlean Hark, LPN

## 2018-11-16 NOTE — Patient Instructions (Signed)

## 2018-11-16 NOTE — Progress Notes (Addendum)
[place]   [phone] [phone]   Clinic Follow up Note   Patient Care Team: Redmond School, MD as PCP - General (Internal Medicine) 11/16/2018  CHIEF COMPLAINT: Follow-up metastatic non-small cell lung cancer  SUMMARY OF ONCOLOGIC HISTORY: Oncology History Overview Note  Cancer Staging Metastatic non-small cell lung cancer [facility]) Staging form: Lung, AJCC 8th Edition - Clinical stage from 10/17/2017: Stage IV (cT3, cN1, cM1b) - Signed by Truitt Merle, MD on 10/17/2017     Metastatic non-small cell lung cancer ([place])  09/20/2017 Imaging   US Breast Left 09/20/17  IMPRESSION Indeterminte palpable mass measuring 3.2x1.7x2.8 cm  inferior medial to the inframammary fold of the left breast.     09/20/2017 Initial Biopsy   Diagnosis 09/20/17 Soft Tissue Needle Core Biopsy, inferior medial to IMF - ADENOCARCINOMA. - SEE MICROSCOPIC DESCRIPTION.  Microscopic Comment Immunohistochemistry will be performed and reported as an addendum. (JDP:ah 09/23/17) ADDENDUM: Immunohistochemistry shows the tumor is strongly positive with cytokeratin AE1/AE3, cytokeratin 7 and shows moderate weak positivity with CDX-2. The tumor is negative with estrogen receptor, progesterone receptor, GCDFP, GATA-3, Napsin A, TTF-1, WT-1 and cytokeratin 20. The immunophenotype is consistent with metastatic carcinoma. The combination of CDX-2 and cytokeratin 7 positivity raises the possibility of an upper gastrointestinal primary. Clinical correlation is essential.   10/06/2017 Initial Diagnosis   Metastatic adenocarcinoma involving soft tissue with unknown primary site [facility])   10/10/2017 Procedure   Upper Endoscopy by Dr. Silverio Decamp 10/10/17  IMPRESSION - Normal esophagus. - Z-line regular, 35 cm from the incisors. - Gastric bypass with a normal-sized pouch and intact staple line. Gastrojejunal anastomosis characterized by healthy appearing mucosa. - No specimens collected.    10/12/2017 Imaging   CT CAP WO Contrast 10/12/17  IMPRESSION: 7 cm central right lung mass involving the right hilum, with postobstructive collapse of the right middle lobe, highly suspicious for primary bronchogenic carcinoma. 5 cm masslike opacity in the superior segment of the right lower lobe may represent carcinoma or postobstructive pneumonitis. 3.3 cm soft tissue mass in the lower anterior chest wall soft tissues, suspicious for metastatic disease. No evidence of abdominal or pelvic metastatic disease.    10/14/2017 Imaging   MRI Brain 10/14/17  IMPRESSION: 1. No metastatic disease or acute intracranial abnormality. Normal MRI appearance of the brain. 2. 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     10/17/2017 Cancer Staging   Staging form: Lung, AJCC 8th Edition - Clinical stage from 10/17/2017: Stage IV (cT3, cN1, cM1b) - Signed by Truitt Merle, MD on 10/17/2017   10/24/2017 - 11/04/2017 Radiation Therapy    The Right lung mass was treated to 30 Gy in 10 fractions of 3 Gy   11/09/2017 - 03/24/2018 Chemotherapy   -first line chemo with carboplatin, paclitaxel, Atezolizumab and bevacizumab every 3weeks starting 11/08/17.  -Switched to maintenance therapy with avastin and atezolizumab q3weeks on 03/24/18   01/10/2018 Imaging   01/10/2018 CT CA IMPRESSION: 1. Response to therapy of central right lower lobe lung mass. Re-expansion of the right middle lobe with significantly improved right lower lobe postobstructive pneumonitis. Residual geographic airspace and ground-glass opacity within the medial right lung, primarily favored to be radiation induced. 2. Near complete resolution of presternal soft tissue mass. 3. New right adrenal nodule since 10/11/2017, suspicious for metastatic disease. 4. Right-sided Port-A-Cath with probable nonocclusive thrombus along the catheter within the right brachiocephalic vein. 5. Age advanced coronary artery atherosclerosis. Recommend  assessment of coronary risk factors and consideration of medical  therapy. 6. Aortic atherosclerosis (ICD10-I70.0) and emphysema (ICD10-J43.9).   03/10/2018 Imaging   CT CAP W Contrast 03/10/18 IMPRESSION: 1. Radiation changes involving the right paramediastinal lung and right hilum but no findings suspicious for residual or recurrent tumor. 2. No evidence of pulmonary metastatic disease. Stable emphysematous changes and areas of pulmonary scarring. 3. Stable 11 mm right adrenal gland nodule. 4. No findings for metastatic disease involving the liver or bony structures.   03/24/2018 - 04/24/2018 Chemotherapy   The patient had bevacizumab (AVASTIN) 1,300 mg in sodium chloride 0.9 % 100 mL chemo infusion, 15.5 mg/kg = 1,275 mg, Intravenous,  Once, 1 of 4 cycles Administration: 1,300 mg (03/24/2018) atezolizumab (TECENTRIQ) 1,200 mg in sodium chloride 0.9 % 250 mL chemo infusion, 1,200 mg, Intravenous, Once, 1 of 4 cycles Administration: 1,200 mg (03/24/2018)  for chemotherapy treatment.    04/11/2018 Imaging   CT ANGIO CHEST PE W OR WO CONTRAST  IMPRESSION: 1. No evidence of acute pulmonary embolism. 2. Progressive radiation changes in the right perihilar region. Cavitary retro hilar mass and right paratracheal lymph node have enlarged, suspicious for local recurrence/disease progression. 3. Enlarging right adrenal nodule suspicious for metastatic disease. 4. New right pleural effusion with increased atelectasis at both lung bases. 5. These results will be called to the ordering clinician or representative by the Radiology Department at the imaging location.    04/21/2018 Imaging   NUCLEAR MEDICINE PET SKULL BASE TO THIGH IMPRESSION: 1. Marked hypermetabolism in the amorphous soft tissue involving the right hilum. This represents the central component of the apparent radiation scarring. 2. Hypermetabolic mediastinal nodal metastases with nodal metastases identified in the anterior  right juxta diaphragmatic fat. 3. Hypermetabolic right adrenal metastasis. 4. Interval progression of loculated right pleural effusion.    05/02/2018 - 07/25/2018 Chemotherapy   Second line Alimta 500mg /m2 every 3 weeks, started 05/02/2018. D/c due to disease pregression after 4 cycles     07/21/2018 Imaging   CT CAP 07/21/18  IMPRESSION: 1. Enlarging right adrenal metastatic lesion a mildly enlarging mediastinal adenopathy compatible with mild progression of disease. No new metastatic foci are identified. 2. Evolutionary findings along the right perihilar/paramediastinal radiation fibrosis including some improvement in aeration in the right upper lobe, but enlargement of the cavitary/centrally necrotic region in the superior segment right lower lobe, which is gas-filled and which probably connects to the otherwise focally occluded bronchus intermedius. 3. Other imaging findings of potential clinical significance: Probable left anterior descending coronary atherosclerosis. Mild to moderate intrahepatic biliary dilatation mild extrahepatic biliary dilatation, much of which may be a physiologic response to cholecystectomy. Prominent stool throughout the colon favors constipation. Aortic Atherosclerosis (ICD10-I70.0).   07/26/2018 - 08/17/2018 Chemotherapy   Third-line docetaxel and cyramza q3 weeks on 07/26/18. D/c after cycle 2 due to PNA and mild disease progression   09/07/2018 Imaging   CT Angio Chest 09/07/18  IMPRESSION: 1. Detection of pulmonary emboli is severely limited by motion artifact and suboptimal contrast bolus timing. Given this limitation, no PE was identified. The pulmonary arteries are dilated which can be seen in patients with elevated pulmonary artery pressures. 2. Diffuse ground-glass airspace opacities involving the right upper lobe, left upper lobe, and left lower lobe. Findings are concerning for multifocal pneumonia (viral or bacterial). 3. Persistent ill-defined  soft tissue in the right perihilar region with multiple pathologically enlarged mediastinal and hilar lymph nodes. There is a growing air-filled cavitary area within the right perihilar region which may be related to post treatment changes. 4. Small  right-sided pleural effusion. 5. Growing subcutaneous soft tissue nodule in the anterior midline left chest wall as detailed above. This could represent a metastatic deposit. Again noted is a right adrenal metastatic lesion as detailed above.   Aortic Atherosclerosis (ICD10-I70.0).   09/07/2018 - 09/14/2018 Hospital Admission   Admit date: 09/07/18 Admission diagnosis: COPD exacerbation, PNA Additional comments: After 3 cycles of third-line chemo she was admitted for COPD and PNA. She was placed on oxygen and treated with antibotics.    10/05/2018 -  Chemotherapy   Fourth-line Weekly Gemcitabine starting 10/05/18   10/11/2018 Imaging   CT AP W Contrast  IMPRESSION: 1. Enlarging right adrenal gland metastatic lesion. 2. Enlarging subcutaneous soft tissue lesions in the lower anterior chest wall and the anterior abdominal wall. 3. New soft tissue lesion noted in the lower aspect of the right ischial rectal fossa. 4. Stable intra and extrahepatic biliary dilatation. No findings for hepatic metastatic disease. 5. No acute abdominal/pelvic findings or lymphadenopathy. 6. No definite bone lesions are identified. Specifically I do not see a definite cause for the patient's right hip pain. MRI would be suggested for further evaluation.   11/13/2018 Imaging   CT CAP IMPRESSION: 1. Mild mixed interval changes. Mild right paratracheal adenopathy and small soft tissue metastasis in the supraumbilical right abdominal wall have mildly decreased. Right perihilar soft tissue, ventral left chest wall soft tissue metastasis and right adrenal metastasis have slightly increased. New 5 mm apical left upper lobe pulmonary nodule, metastasis not excluded. 2.  Small right pleural effusion, slightly increased. 3.  Aortic Atherosclerosis (ICD10-I70.0).     CURRENT THERAPY: Fourth-line weekly Gemcitabine,2 weeks on and one-week off,starting7/16/20  INTERVAL HISTORY: Ms. Corrie returns for follow-up and treatment as scheduled.  She completed cycle 2 gemcitabine on 11/01/2018. She underwent restaging CT on 11/13/2018. She is doing well overall. Left sternal nodule and LUQ/chest pain remains moderate to severe at times. Pain today is 6/10 after taking norco. She admits to trying oxycontin 10 mg BID x2 days, after no relief she stopped taking gabapentin and norco and started escalating dose of oxycontin, by 10 mg each day over 6 days up to 60 mg (once per day). She ultimately stopped oxycontin completely a few days ago and resumed gabapentin and norco. She had no AEs other than sleepiness. Her cough is improving. Takes O2 off at times for ADLs. Can walk short distances without it then develops dyspnea. Nausea is controlled. No vomiting. Bowels move once daily. Has been taking K once daily. Po intake is good. Denies fever, chills, swelling, rash.    MEDICAL HISTORY:  Past Medical History:  Diagnosis Date  . Anxiety   . Bronchitis   . COPD (chronic obstructive pulmonary disease) ([place])   . Depression   . GERD (gastroesophageal reflux disease)   . H/O gastric bypass    1999  . Irritable bowel syndrome   . Lung cancer [facility])     SURGICAL HISTORY: Past Surgical History:  Procedure Laterality Date  . BACK SURGERY    . CHOLECYSTECTOMY    . COLON SURGERY     for a kink in her colon and 12 inchs removed  . COLONOSCOPY N/A 12/13/2013   Procedure: COLONOSCOPY;  Surgeon: Rogene Houston, MD;  Location: [facility] ENDO SUITE;  Service: Endoscopy;  Laterality: N/A;  200  . ESOPHAGOGASTRODUODENOSCOPY N/A 12/13/2013   Procedure: ESOPHAGOGASTRODUODENOSCOPY (EGD);  Surgeon: Rogene Houston, MD;  Location: [facility] ENDO SUITE;  Service: Endoscopy;  Laterality: N/A;  . Gastric  Bypas  2000  . IR IMAGING GUIDED PORT INSERTION  11/14/2017    I have reviewed the social history and family history with the patient and they are unchanged from previous note.  ALLERGIES:  is allergic to codeine.  MEDICATIONS:  Current Outpatient Medications  Medication Sig Dispense Refill  . albuterol (PROVENTIL HFA) 108 (90 Base) MCG/ACT inhaler Inhale 2 puffs into the lungs 2 (two) times daily. 0.006 g 1  . ALPRAZolam (XANAX) 0.25 MG tablet Take 1 tablet (0.25 mg total) by mouth at bedtime as needed for anxiety or sleep. 30 tablet 0  . butalbital-acetaminophen-caffeine (FIORICET, ESGIC) 50-325-40 MG tablet Take 1-2 tablets by mouth every 6 (six) hours as needed for headache. 60 tablet 1  . CALCIUM-MAGNESIUM-ZINC PO Take 1 tablet by mouth daily.    Marland Kitchen dexamethasone (DECADRON) 4 MG tablet Take 2 tablets (8 mg total) by mouth 2 (two) times daily. Start the day before Taxotere, then take on day after chemo for 2 days 30 tablet 1  . dicyclomine (BENTYL) 20 MG tablet Take 1 tablet (20 mg total) by mouth every 6 (six) hours. 60 tablet 1  . FLUoxetine (PROZAC) 40 MG capsule Take 80 mg by mouth daily.    . furosemide (LASIX) 20 MG tablet Take 1 tablet (20 mg total) by mouth daily as needed. (Patient taking differently: Take 20 mg by mouth daily as needed for fluid or edema. ) 30 tablet 0  . gabapentin (NEURONTIN) 300 MG capsule Take 300 mg by mouth daily as needed (for pain).   4  . guaiFENesin (MUCINEX) 600 MG 12 hr tablet Take 1 tablet (600 mg total) by mouth 2 (two) times daily. 60 tablet 0  . HYDROcodone-acetaminophen (NORCO) 10-325 MG tablet Take 2 tablets by mouth every 6 (six) hours as needed (For pain.).     Marland Kitchen ipratropium-albuterol (DUONEB) 0.5-2.5 (3) MG/3ML SOLN Take 3 mLs by nebulization every 4 (four) hours as needed. (Patient taking differently: Take 3 mLs by nebulization every 4 (four) hours as needed (for shortness of breath). ) 360 mL 1  . lidocaine-prilocaine (EMLA) cream APPLY 1  APPLICATION TO PORT TOPICALLY AS NEEDED ONE HOUR PRIOR TO PORT ACCESS/CHEMO (Patient taking differently: Apply 1 application topically as needed (For port-a-cath.). ) 30 g 0  . magic mouthwash w/lidocaine SOLN Take 5 mLs by mouth 3 (three) times daily as needed for mouth pain. 240 mL 0  . Multiple Vitamin (MULTIVITAMIN) tablet Take 1 tablet by mouth daily.    Marland Kitchen nystatin (MYCOSTATIN) 100000 UNIT/ML suspension SWISH ORALLY THEN SPIT WITH 5 ML THREE TIMES DAILY (Patient taking differently: Use as directed 5 mLs in the mouth or throat 3 (three) times daily. ) 240 mL 0  . omeprazole (PRILOSEC) 20 MG capsule Take 1 capsule (20 mg total) by mouth 2 (two) times daily before a meal. 60 capsule 0  . ondansetron (ZOFRAN) 8 MG tablet TAKE 1 TABLET BY MOUTH EVERY 8 HOURS AS NEEDED FOR NAUSEA OR VOMITING 30 tablet 0  . oxyCODONE (OXYCONTIN) 20 mg 12 hr tablet Take 1 tablet (20 mg total) by mouth every 12 (twelve) hours. 60 tablet 0  . potassium chloride SA (K-DUR) 20 MEQ tablet Take 1 tablet by mouth once daily 30 tablet 0  . predniSONE (DELTASONE) 10 MG tablet Take 0.5 tablets daily 30 tablet 0  . prochlorperazine (COMPAZINE) 10 MG tablet TAKE 1 TABLET BY MOUTH EVERY 6 HOURS AS NEEDED FOR NAUSEA FOR VOMITING 30 tablet 0  . rOPINIRole (REQUIP) 0.5  MG tablet Take 0.5 mg by mouth at bedtime as needed (For restless legs.).   11  . sucralfate (CARAFATE) 1 g tablet TAKE 1 TABLET(1 GRAM) BY MOUTH FOUR TIMES DAILY (Patient taking differently: Take 1 g by mouth 4 (four) times daily. ) 120 tablet 0  . TRELEGY ELLIPTA 100-62.5-25 MCG/INH AEPB Inhale 1 puff into the lungs daily.   5  . zolpidem (AMBIEN) 10 MG tablet Take 10 mg by mouth at bedtime.      No current facility-administered medications for this visit.    Facility-Administered Medications Ordered in Other Visits  Medication Dose Route Frequency Provider Last Rate Last Dose  . gemcitabine (GEMZAR) 2,014 mg in sodium chloride 0.9 % 250 mL chemo infusion  1,000  mg/m2 (Order-Specific) Intravenous Once Truitt Merle, MD      . heparin lock flush 100 unit/mL  500 Units Intracatheter Once PRN Truitt Merle, MD      . sodium chloride flush (NS) 0.9 % injection 10 mL  10 mL Intracatheter PRN Truitt Merle, MD        PHYSICAL EXAMINATION: ECOG PERFORMANCE STATUS: 2 - Symptomatic, <50% confined to bed  Vitals:   11/16/18 1515 11/16/18 1516  BP: 96/74 104/73  Pulse: 83 90  Resp: 17   Temp: 98.4 F (36.9 C) 98.4 F (36.9 C)  SpO2: 97% 99%   Filed Weights   11/16/18 1515 11/16/18 1516  Weight: 177 lb 9.6 oz (80.6 kg) 177 lb 9.6 oz (80.6 kg)    GENERAL:alert, no distress and comfortable SKIN: no rash. Mild erythema over sub-sternal nodule. No skin breakdown EYES: sclera clear LUNGS: wheezing, more on right side. Normal breathing effort HEART: regular rate & rhythm, no lower extremity edema Musculoskeletal:no cyanosis of digits and no clubbing  NEURO: alert & oriented x 3 with fluent speech, no focal motor/sensory deficits PAC without erythema   LABORATORY DATA:  I have reviewed the data as listed CBC Latest Ref Rng & Units 11/16/2018 11/01/2018 10/26/2018  WBC 4.0 - 10.5 K/uL 10.0 3.3(L) 9.2  Hemoglobin 12.0 - 15.0 g/dL 9.7(L) 9.5(L) 9.9(L)  Hematocrit 36.0 - 46.0 % 31.4(L) 30.4(L) 32.6(L)  Platelets 150 - 400 K/uL 707(H) 501(H) 756(H)     CMP Latest Ref Rng & Units 11/16/2018 11/01/2018 10/26/2018  Glucose 70 - 99 mg/dL 111(H) 116(H) 75  BUN 6 - 20 mg/dL 7 6 4(L)  Creatinine 0.44 - 1.00 mg/dL 0.72 0.74 0.71  Sodium 135 - 145 mmol/L 140 137 143  Potassium 3.5 - 5.1 mmol/L 5.3(H) 4.2 4.4  Chloride 98 - 111 mmol/L 103 101 107  CO2 22 - 32 mmol/L 29 28 27   Calcium 8.9 - 10.3 mg/dL 8.3(L) 8.3(L) 9.1  Total Protein 6.5 - 8.1 g/dL 5.6(L) 5.9(L) 6.1(L)  Total Bilirubin 0.3 - 1.2 mg/dL 0.3 0.3 <0.2(L)  Alkaline Phos 38 - 126 U/L 221(H) 299(H) 343(H)  AST 15 - 41 U/L 21 85(H) 121(H)  ALT 0 - 44 U/L 23 64(H) 88(H)      RADIOGRAPHIC STUDIES: I have  personally reviewed the radiological images as listed and agreed with the findings in the report. No results found.   ASSESSMENT & PLAN: Ajane Novella a 53 y.o.femalewith   2. 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 -Diagnosed in 09/2017. Treated with chemo and radiation. She was initially onfirst linecarboplatin, paclitaxel, Atezolizumab and bevacizumab every 3weekss/p 6 cycles, with good partial response.Switched tomaintenance therapy withAvastinandAtezolizumabq3weeksstartedon 03/24/18. -Due to disease progression, chemoswitched tosecond line alimta 500mg /m2 every  3 weekson 05/02/2018, shehas been tolerating well -CT CAP 5/1/20showing disease progression, Dr. Burr Medico recommended changing to third line treatmentdocetaxelandCyramza every 3 weeks; the goal is palliative -S/p cycle 2 on 08/17/18 -CTA noted disease progression, 3rd line treatment was discontinued -began 4th linegemcitabine day 1 and day 8 q21 days,s/p 2 cycles  -Restaging CT CAP on 824 shows mixed interval changes.  Mild right paratracheal adenopathy and small soft tissue metastasis in the supraumbilical right abdominal wall, mildly decreased.  Right perihilar soft tissue, ventral chest wall soft tissue metastasis-adrenal metastasis is slightly improved.  There is 5 mm apical left upper lobe pulmonary nodule, metastasis not excluded. I reviewed these results with Dr. Burr Medico and the patient.  2.Acute hypoxic respiratory illness and PNA requiring hospitalization from 09/07/18 - 09/14/18- on prednisone and supplemental O2 3.Dyspnea and dry cough-secondary to her COPD, right lung cancer and radiation changescontinuebronchodilator 4. Frontal Headaches-improved, followed by Dr. Mickeal Skinner 5. Anxiety and Depression -Currently on Xanax andProzac; mood improved after cymbalta was switched to prozac. 6. Chronic back pain-previously on tramadol, better controlled on norco 2  tabsq6Hr.Stableoverall 7. Low appetite, Nausea-Currently on Mirtazapine, omeprazole, ondansetron,prochlorperazine, and sucralfate. 8. Hypokalemia- on K replacementonce daily  9.Transaminitis- likely related to chemo,slightly increased today 10.Goal of care discussion- full code 11. Right posterior hip pain 12. New left lung/chest and axilla pain, new as of 10/26/18. Started oxycontin 11/01/2018 13. Unsteadiness/dizziness, new as of 10/26/18   Disposition: Ms. Betty appears stable. She completed 2 cycles single agent gemcitabine on days 1 and 8 every 21 days. She tolerates treatment well with intermittent nausea. I reviewed her restaging CT with Dr. Burr Medico and the patient which is felt to be stable overall. Ms. Mauceri was seen with Dr. Burr Medico who recommends to continue current chemotherapy. Labs reviewed, adequate for treatment. K 5.3, she will hold her potassium supplement. She has a BM daily. She will take miralax if she does not have a BM. We discussed her pain regimen. I recommend to increase oxycontin to 20 mg q12h and continue norco PRN for breakthrough. New prescription given. I reviewed safety measures on opioids. She agrees to take medication as prescribed. She knows I will no longer prescribe this medication if she is noncompliant.   She will proceed with cycle 3 day 1 gemcitabine today, return in 1 week for f/u and day 8.   All questions were answered. The patient knows to call the clinic with any problems, questions or concerns. No barriers to learning was detected.     Alla Feeling, NP 11/16/18   Addendum  I have seen the patient, examined her. I agree with the assessment and and plan and have edited the notes.   I personally reviewed her restaging CT scan. Although her subcutaneous left lower chest wall nodule has increased from September 07, 2018, it has been stable since that she started gemcitabine in mid July.  Other lesions are overall stable.  She is tolerating  gemcitabine well, I recommend to continue for disease control.   Her previous diffuse patchy infiltrative change in lungs have resolved, she is on low dose prednisone, will continue.   Patient understands the overall poor prognosis, and is very limited more treatment options.  She is coping with her terminal disease better lately, appears to be less anxious. Will continue supportive care.   Truitt Merle  11/16/2018

## 2018-11-16 NOTE — Patient Instructions (Signed)
[facility] Discharge Instructions for Patients Receiving Chemotherapy  Today you received the following chemotherapy agents:  Gemcitabine  To help prevent nausea and vomiting after your treatment, we encourage you to take your nausea medication as prescribed.    If you develop nausea and vomiting that is not controlled by your nausea medication, call the clinic.   BELOW ARE SYMPTOMS THAT SHOULD BE REPORTED IMMEDIATELY:  *FEVER GREATER THAN 100.5 F  *CHILLS WITH OR WITHOUT FEVER  NAUSEA AND VOMITING THAT IS NOT CONTROLLED WITH YOUR NAUSEA MEDICATION  *UNUSUAL SHORTNESS OF BREATH  *UNUSUAL BRUISING OR BLEEDING  TENDERNESS IN MOUTH AND THROAT WITH OR WITHOUT PRESENCE OF ULCERS  *URINARY PROBLEMS  *BOWEL PROBLEMS  UNUSUAL RASH Items with * indicate a potential emergency and should be followed up as soon as possible.  Feel free to call the clinic should you have any questions or concerns. The clinic phone number is [phone].  Please show the CHEMO ALERT CARD at check-in to the Emergency Department and triage nurse.   

## 2018-11-17 ENCOUNTER — Telehealth: Payer: Self-pay | Admitting: Nurse Practitioner

## 2018-11-17 NOTE — Telephone Encounter (Signed)
Scheduled appt per 8/27 los.  Patient will get a new updated calendar at her next appt.

## 2018-11-20 NOTE — Progress Notes (Signed)
[place]   [phone] [phone]   Clinic Follow up Note   Patient Care Team: Redmond School, MD as PCP - General (Internal Medicine)  Date of Service:  11/23/2018  CHIEF COMPLAINT: Metastatic non-small cell lung cancer  SUMMARY OF ONCOLOGIC HISTORY: Oncology History Overview Note  Cancer Staging Metastatic non-small cell lung cancer [facility]) Staging form: Lung, AJCC 8th Edition - Clinical stage from 10/17/2017: Stage IV (cT3, cN1, cM1b) - Signed by Truitt Merle, MD on 10/17/2017     Metastatic non-small cell lung cancer ([place])  09/20/2017 Imaging   US Breast Left 09/20/17  IMPRESSION Indeterminte palpable mass measuring 3.2x1.7x2.8 cm  inferior medial to the inframammary fold of the left breast.     09/20/2017 Initial Biopsy   Diagnosis 09/20/17 Soft Tissue Needle Core Biopsy, inferior medial to IMF - ADENOCARCINOMA. - SEE MICROSCOPIC DESCRIPTION.  Microscopic Comment Immunohistochemistry will be performed and reported as an addendum. (JDP:ah 09/23/17) ADDENDUM: Immunohistochemistry shows the tumor is strongly positive with cytokeratin AE1/AE3, cytokeratin 7 and shows moderate weak positivity with CDX-2. The tumor is negative with estrogen receptor, progesterone receptor, GCDFP, GATA-3, Napsin A, TTF-1, WT-1 and cytokeratin 20. The immunophenotype is consistent with metastatic carcinoma. The combination of CDX-2 and cytokeratin 7 positivity raises the possibility of an upper gastrointestinal primary. Clinical correlation is essential.   10/06/2017 Initial Diagnosis   Metastatic adenocarcinoma involving soft tissue with unknown primary site [facility])   10/10/2017 Procedure   Upper Endoscopy by Dr. Silverio Decamp 10/10/17  IMPRESSION - Normal esophagus. - Z-line regular, 35 cm from the incisors. - Gastric bypass with a normal-sized pouch and intact staple line. Gastrojejunal anastomosis characterized by healthy appearing mucosa. - No specimens collected.    10/12/2017 Imaging   CT CAP WO Contrast 10/12/17  IMPRESSION: 7 cm central right lung mass involving the right hilum, with postobstructive collapse of the right middle lobe, highly suspicious for primary bronchogenic carcinoma. 5 cm masslike opacity in the superior segment of the right lower lobe may represent carcinoma or postobstructive pneumonitis. 3.3 cm soft tissue mass in the lower anterior chest wall soft tissues, suspicious for metastatic disease. No evidence of abdominal or pelvic metastatic disease.    10/14/2017 Imaging   MRI Brain 10/14/17  IMPRESSION: 1. No metastatic disease or acute intracranial abnormality. Normal MRI appearance of the brain. 2. 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     10/17/2017 Cancer Staging   Staging form: Lung, AJCC 8th Edition - Clinical stage from 10/17/2017: Stage IV (cT3, cN1, cM1b) - Signed by Truitt Merle, MD on 10/17/2017   10/24/2017 - 11/04/2017 Radiation Therapy    The Right lung mass was treated to 30 Gy in 10 fractions of 3 Gy   11/09/2017 - 03/24/2018 Chemotherapy   -first line chemo with carboplatin, paclitaxel, Atezolizumab and bevacizumab every 3weeks starting 11/08/17.  -Switched to maintenance therapy with avastin and atezolizumab q3weeks on 03/24/18   01/10/2018 Imaging   01/10/2018 CT CA IMPRESSION: 1. Response to therapy of central right lower lobe lung mass. Re-expansion of the right middle lobe with significantly improved right lower lobe postobstructive pneumonitis. Residual geographic airspace and ground-glass opacity within the medial right lung, primarily favored to be radiation induced. 2. Near complete resolution of presternal soft tissue mass. 3. New right adrenal nodule since 10/11/2017, suspicious for metastatic disease. 4. Right-sided Port-A-Cath with probable nonocclusive thrombus along the catheter within the right brachiocephalic vein. 5. Age advanced coronary artery atherosclerosis. Recommend  assessment of coronary risk factors  and consideration of medical therapy. 6. Aortic atherosclerosis (ICD10-I70.0) and emphysema (ICD10-J43.9).   03/10/2018 Imaging   CT CAP W Contrast 03/10/18 IMPRESSION: 1. Radiation changes involving the right paramediastinal lung and right hilum but no findings suspicious for residual or recurrent tumor. 2. No evidence of pulmonary metastatic disease. Stable emphysematous changes and areas of pulmonary scarring. 3. Stable 11 mm right adrenal gland nodule. 4. No findings for metastatic disease involving the liver or bony structures.   03/24/2018 - 04/24/2018 Chemotherapy   The patient had bevacizumab (AVASTIN) 1,300 mg in sodium chloride 0.9 % 100 mL chemo infusion, 15.5 mg/kg = 1,275 mg, Intravenous,  Once, 1 of 4 cycles Administration: 1,300 mg (03/24/2018) atezolizumab (TECENTRIQ) 1,200 mg in sodium chloride 0.9 % 250 mL chemo infusion, 1,200 mg, Intravenous, Once, 1 of 4 cycles Administration: 1,200 mg (03/24/2018)  for chemotherapy treatment.    04/11/2018 Imaging   CT ANGIO CHEST PE W OR WO CONTRAST  IMPRESSION: 1. No evidence of acute pulmonary embolism. 2. Progressive radiation changes in the right perihilar region. Cavitary retro hilar mass and right paratracheal lymph node have enlarged, suspicious for local recurrence/disease progression. 3. Enlarging right adrenal nodule suspicious for metastatic disease. 4. New right pleural effusion with increased atelectasis at both lung bases. 5. These results will be called to the ordering clinician or representative by the Radiology Department at the imaging location.    04/21/2018 Imaging   NUCLEAR MEDICINE PET SKULL BASE TO THIGH IMPRESSION: 1. Marked hypermetabolism in the amorphous soft tissue involving the right hilum. This represents the central component of the apparent radiation scarring. 2. Hypermetabolic mediastinal nodal metastases with nodal metastases identified in the anterior  right juxta diaphragmatic fat. 3. Hypermetabolic right adrenal metastasis. 4. Interval progression of loculated right pleural effusion.    05/02/2018 - 07/25/2018 Chemotherapy   Second line Alimta 579m/m2 every 3 weeks, started 05/02/2018. D/c due to disease pregression after 4 cycles     07/21/2018 Imaging   CT CAP 07/21/18  IMPRESSION: 1. Enlarging right adrenal metastatic lesion a mildly enlarging mediastinal adenopathy compatible with mild progression of disease. No new metastatic foci are identified. 2. Evolutionary findings along the right perihilar/paramediastinal radiation fibrosis including some improvement in aeration in the right upper lobe, but enlargement of the cavitary/centrally necrotic region in the superior segment right lower lobe, which is gas-filled and which probably connects to the otherwise focally occluded bronchus intermedius. 3. Other imaging findings of potential clinical significance: Probable left anterior descending coronary atherosclerosis. Mild to moderate intrahepatic biliary dilatation mild extrahepatic biliary dilatation, much of which may be a physiologic response to cholecystectomy. Prominent stool throughout the colon favors constipation. Aortic Atherosclerosis (ICD10-I70.0).   07/26/2018 - 08/17/2018 Chemotherapy   Third-line docetaxel and cyramza q3 weeks on 07/26/18. D/c after cycle 2 due to PNA and mild disease progression   09/07/2018 Imaging   CT Angio Chest 09/07/18  IMPRESSION: 1. Detection of pulmonary emboli is severely limited by motion artifact and suboptimal contrast bolus timing. Given this limitation, no PE was identified. The pulmonary arteries are dilated which can be seen in patients with elevated pulmonary artery pressures. 2. Diffuse ground-glass airspace opacities involving the right upper lobe, left upper lobe, and left lower lobe. Findings are concerning for multifocal pneumonia (viral or bacterial). 3. Persistent ill-defined  soft tissue in the right perihilar region with multiple pathologically enlarged mediastinal and hilar lymph nodes. There is a growing air-filled cavitary area within the right perihilar region which may be related to post  treatment changes. 4. Small right-sided pleural effusion. 5. Growing subcutaneous soft tissue nodule in the anterior midline left chest wall as detailed above. This could represent a metastatic deposit. Again noted is a right adrenal metastatic lesion as detailed above.   Aortic Atherosclerosis (ICD10-I70.0).   09/07/2018 - 09/14/2018 Hospital Admission   Admit date: 09/07/18 Admission diagnosis: COPD exacerbation, PNA Additional comments: After 3 cycles of third-line chemo she was admitted for COPD and PNA. She was placed on oxygen and treated with antibotics.    10/05/2018 -  Chemotherapy   Fourth-line Weekly Gemcitabine starting 10/05/18   10/11/2018 Imaging   CT AP W Contrast  IMPRESSION: 1. Enlarging right adrenal gland metastatic lesion. 2. Enlarging subcutaneous soft tissue lesions in the lower anterior chest wall and the anterior abdominal wall. 3. New soft tissue lesion noted in the lower aspect of the right ischial rectal fossa. 4. Stable intra and extrahepatic biliary dilatation. No findings for hepatic metastatic disease. 5. No acute abdominal/pelvic findings or lymphadenopathy. 6. No definite bone lesions are identified. Specifically I do not see a definite cause for the patient's right hip pain. MRI would be suggested for further evaluation.   11/13/2018 Imaging   CT CAP IMPRESSION: 1. Mild mixed interval changes. Mild right paratracheal adenopathy and small soft tissue metastasis in the supraumbilical right abdominal wall have mildly decreased. Right perihilar soft tissue, ventral left chest wall soft tissue metastasis and right adrenal metastasis have slightly increased. New 5 mm apical left upper lobe pulmonary nodule, metastasis not excluded. 2.  Small right pleural effusion, slightly increased. 3.  Aortic Atherosclerosis (ICD10-I70.0).      CURRENT THERAPY:  Fourth-line weekly Gemcitabine starting 10/05/18  INTERVAL HISTORY:  Yvonne Lang is here for a follow up and treatment. She presents to the clinic alone. She is doing well. She is currently hot. She notes she tried long acting OxyContin but did not work for her. She is currently just taking hydrocodone 2 tabs every 6 hours which works for her and managed by her PCP. She notes she is does not have an appetite but she makes herself eat and maintains weight. She is stable on her breathing and treatment.    REVIEW OF SYSTEMS:   Constitutional: Denies fevers, chills or abnormal weight loss Eyes: Denies blurriness of vision Ears, nose, mouth, throat, and face: Denies mucositis or sore throat Respiratory: Denies cough, dyspnea or wheezes (+) On oxygen canula  Cardiovascular: Denies palpitation, chest discomfort or lower extremity swelling Gastrointestinal:  Denies nausea, heartburn or change in bowel habits Skin: Denies abnormal skin rashes Lymphatics: Denies new lymphadenopathy or easy bruising Neurological:Denies numbness, tingling or new weaknesses Behavioral/Psych: Mood is stable, no new changes  All other systems were reviewed with the patient and are negative.  MEDICAL HISTORY:  Past Medical History:  Diagnosis Date  . Anxiety   . Bronchitis   . COPD (chronic obstructive pulmonary disease) ([place])   . Depression   . GERD (gastroesophageal reflux disease)   . H/O gastric bypass    1999  . Irritable bowel syndrome   . Lung cancer [facility])     SURGICAL HISTORY: Past Surgical History:  Procedure Laterality Date  . BACK SURGERY    . CHOLECYSTECTOMY    . COLON SURGERY     for a kink in her colon and 12 inchs removed  . COLONOSCOPY N/A 12/13/2013   Procedure: COLONOSCOPY;  Surgeon: Rogene Houston, MD;  Location: [facility];  Service: Endoscopy;  Laterality: N/A;  200  . ESOPHAGOGASTRODUODENOSCOPY N/A 12/13/2013   Procedure: ESOPHAGOGASTRODUODENOSCOPY (EGD);  Surgeon: Rogene Houston, MD;  Location: [facility];  Service: Endoscopy;  Laterality: N/A;  . Gastric Bypas  2000  . IR IMAGING GUIDED PORT INSERTION  11/14/2017    I have reviewed the social history and family history with the patient and they are unchanged from previous note.  ALLERGIES:  is allergic to codeine.  MEDICATIONS:  Current Outpatient Medications  Medication Sig Dispense Refill  . albuterol (PROVENTIL HFA) 108 (90 Base) MCG/ACT inhaler Inhale 2 puffs into the lungs 2 (two) times daily. 0.006 g 1  . ALPRAZolam (XANAX) 0.25 MG tablet Take 1 tablet (0.25 mg total) by mouth at bedtime as needed for anxiety or sleep. 30 tablet 0  . butalbital-acetaminophen-caffeine (FIORICET, ESGIC) 50-325-40 MG tablet Take 1-2 tablets by mouth every 6 (six) hours as needed for headache. 60 tablet 1  . CALCIUM-MAGNESIUM-ZINC PO Take 1 tablet by mouth daily.    Marland Kitchen dexamethasone (DECADRON) 4 MG tablet Take 2 tablets (8 mg total) by mouth 2 (two) times daily. Start the day before Taxotere, then take on day after chemo for 2 days 30 tablet 1  . dicyclomine (BENTYL) 20 MG tablet Take 1 tablet (20 mg total) by mouth every 6 (six) hours. 60 tablet 1  . FLUoxetine (PROZAC) 40 MG capsule Take 80 mg by mouth daily.    . furosemide (LASIX) 20 MG tablet Take 1 tablet (20 mg total) by mouth daily as needed. (Patient taking differently: Take 20 mg by mouth daily as needed for fluid or edema. ) 30 tablet 0  . gabapentin (NEURONTIN) 300 MG capsule Take 300 mg by mouth daily as needed (for pain).   4  . guaiFENesin (MUCINEX) 600 MG 12 hr tablet Take 1 tablet (600 mg total) by mouth 2 (two) times daily. 60 tablet 0  . HYDROcodone-acetaminophen (NORCO) 10-325 MG tablet Take 2 tablets by mouth every 6 (six) hours as needed (For pain.).     Marland Kitchen ipratropium-albuterol (DUONEB) 0.5-2.5 (3) MG/3ML SOLN Take 3 mLs by nebulization  every 4 (four) hours as needed. (Patient taking differently: Take 3 mLs by nebulization every 4 (four) hours as needed (for shortness of breath). ) 360 mL 1  . lidocaine-prilocaine (EMLA) cream APPLY 1 APPLICATION TO PORT TOPICALLY AS NEEDED ONE HOUR PRIOR TO PORT ACCESS/CHEMO (Patient taking differently: Apply 1 application topically as needed (For port-a-cath.). ) 30 g 0  . magic mouthwash w/lidocaine SOLN Take 5 mLs by mouth 3 (three) times daily as needed for mouth pain. 240 mL 0  . Multiple Vitamin (MULTIVITAMIN) tablet Take 1 tablet by mouth daily.    Marland Kitchen nystatin (MYCOSTATIN) 100000 UNIT/ML suspension SWISH ORALLY THEN SPIT WITH 5 ML THREE TIMES DAILY (Patient taking differently: Use as directed 5 mLs in the mouth or throat 3 (three) times daily. ) 240 mL 0  . omeprazole (PRILOSEC) 20 MG capsule Take 1 capsule (20 mg total) by mouth 2 (two) times daily before a meal. 60 capsule 0  . ondansetron (ZOFRAN) 8 MG tablet TAKE 1 TABLET BY MOUTH EVERY 8 HOURS AS NEEDED FOR NAUSEA OR VOMITING 30 tablet 0  . potassium chloride SA (K-DUR) 20 MEQ tablet Take 1 tablet by mouth once daily 30 tablet 0  . predniSONE (DELTASONE) 10 MG tablet Take 0.5 tablets daily 30 tablet 0  . prochlorperazine (COMPAZINE) 10 MG tablet TAKE 1 TABLET BY MOUTH EVERY 6 HOURS AS NEEDED FOR NAUSEA FOR VOMITING 30  tablet 0  . rOPINIRole (REQUIP) 0.5 MG tablet Take 0.5 mg by mouth at bedtime as needed (For restless legs.).   11  . sucralfate (CARAFATE) 1 g tablet TAKE 1 TABLET(1 GRAM) BY MOUTH FOUR TIMES DAILY 120 tablet 0  . TRELEGY ELLIPTA 100-62.5-25 MCG/INH AEPB Inhale 1 puff into the lungs daily.   5  . zolpidem (AMBIEN) 10 MG tablet Take 10 mg by mouth at bedtime.     Marland Kitchen oxyCODONE (OXYCONTIN) 20 mg 12 hr tablet Take 1 tablet (20 mg total) by mouth every 12 (twelve) hours. (Patient not taking: Reported on 11/23/2018) 60 tablet 0   No current facility-administered medications for this visit.    Facility-Administered Medications  Ordered in Other Visits  Medication Dose Route Frequency Provider Last Rate Last Dose  . gemcitabine (GEMZAR) 2,000 mg in sodium chloride 0.9 % 250 mL chemo infusion  2,000 mg Intravenous Once Truitt Merle, MD 605 mL/hr at 11/23/18 1630 2,000 mg at 11/23/18 1630  . heparin lock flush 100 unit/mL  500 Units Intracatheter Once PRN Truitt Merle, MD      . sodium chloride flush (NS) 0.9 % injection 10 mL  10 mL Intracatheter PRN Truitt Merle, MD        PHYSICAL EXAMINATION: ECOG PERFORMANCE STATUS: 2 - Symptomatic, <50% confined to bed  Vitals:   11/23/18 1421  BP: (!) 141/82  Pulse: (!) 121  Resp: 17  Temp: 98.7 F (37.1 C)  SpO2: 98%   Filed Weights   11/23/18 1421  Weight: 179 lb 8 oz (81.4 kg)    GENERAL:alert, no distress and comfortable SKIN: skin color, texture, turgor are normal, no rashes or significant lesions EYES: normal, Conjunctiva are pink and non-injected, sclera clear  NECK: supple, thyroid normal size, non-tender, without nodularity LUNGS: clear to auscultation and percussion with normal breathing effort HEART: regular rate & rhythm and no murmurs and no lower extremity edema ABDOMEN:abdomen soft, non-tender and normal bowel sounds Musculoskeletal:no cyanosis of digits and no clubbing  NEURO: alert & oriented x 3 with fluent speech, no focal motor/sensory deficits  LABORATORY DATA:  I have reviewed the data as listed CBC Latest Ref Rng & Units 11/23/2018 11/16/2018 11/01/2018  WBC 4.0 - 10.5 K/uL 5.1 10.0 3.3(L)  Hemoglobin 12.0 - 15.0 g/dL 10.5(L) 9.7(L) 9.5(L)  Hematocrit 36.0 - 46.0 % 33.9(L) 31.4(L) 30.4(L)  Platelets 150 - 400 K/uL 498(H) 707(H) 501(H)     CMP Latest Ref Rng & Units 11/23/2018 11/16/2018 11/01/2018  Glucose 70 - 99 mg/dL 107(H) 111(H) 116(H)  BUN 6 - 20 mg/dL <4(L) 7 6  Creatinine 0.44 - 1.00 mg/dL 0.63 0.72 0.74  Sodium 135 - 145 mmol/L 133(L) 140 137  Potassium 3.5 - 5.1 mmol/L 4.4 5.3(H) 4.2  Chloride 98 - 111 mmol/L 101 103 101  CO2 22 - 32  mmol/L _0 Calcium 8.9 - 10.3 mg/dL 8.1(L) 8.3(L) 8.3(L)  Total Protein 6.5 - 8.1 g/dL 6.1(L) 5.6(L) 5.9(L)  Total Bilirubin 0.3 - 1.2 mg/dL 0.3 0.3 0.3  Alkaline Phos 38 - 126 U/L 334(H) 221(H) 299(H)  AST 15 - 41 U/L 67(H) 21 85(H)  ALT 0 - 44 U/L 49(H) 23 64(H)      RADIOGRAPHIC STUDIES: I have personally reviewed the radiological images as listed and agreed with the findings in the report. No results found.   ASSESSMENT & PLAN:  Yvonne Lang is a 53 y.o. female with   1. 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 -Diagnosed in 09/2017. Treated with chemo and radiation. She was initially onfirst linecarboplatin, paclitaxel, Atezolizumab and bevacizumab every 3weekss/p 6 cycles, with good partial response.Switched tomaintenance therapy withAvastinandAtezolizumabq3weeksstartedon 03/24/18. -Due to disease progression, chemoswitched tosecond line alimta 539m/m2 every 3 weekson 05/02/2018, shehas been tolerating well -Based onCT CAP from 5/1/20she hadfurtherdisease progression. Second-line treatment was d/c after 4 cycles.  -She started third-linedocetaxel and cyramza q3 weeks on 07/26/18.She toleratesmoderately well with moderate fatigue and N&V.  -S/p 3 cycles, she had recent COPDexacerbationandPNA in 08/2018.Her CT angio chest duringhospitalstay showed mild progression of chest LN and soft tissue nodule of anterior midline left chest wall.  -Given mild disease progression and possible relationof docetaxelto hermultifocalPNA I d/c her third-line treatment.  -I started her on fourth line weekly Gemcitabine on 10/05/18. She tolerated first cycle well with mild nausea, manageable.  -Based on her 11/13/18 CT CAP, although her subcutaneous left lower chest wall nodule has increased from September 07, 2018, it has been stable since that she started gemcitabine in mid July. Other lesions are overall stable. She is tolerating  gemcitabine well, I recommend to continue for disease control. She agrees.  -Labs reviewed, CBC and CMP WNL except Hg 10.5, PLT 498K, BG 107, Ca 8.1, Protein 6.1, albumin 2.9, AST 67, ALT 49, Alk phos 334. Overall adequate to proceed with Gemcitabine -F/u in 2 weeks   2.Dyspnea and dry cough secondary to her COPD,right lung cancer and radiation changes  -Overall stable, she will continuebronchodilator -In 08/2017 she had recent COPD exacerbation andPneumoniainfection. She was treated inpatient with oxygen, antibiotics and steroids. -She was discharged on 3L oxygen canula and 411mprednisone daily and breathing treatment TID.  -Starting 09/22/18, I will have her reduce prednisone to 3093mnd continue to very slowly ween her off.  -She still has Dyspnea upon mild activities. Dry cough persists. -Her breathing is stable on 5mg64mednisone and she was able to reduce her oxygen from 2L. She has struggled to wean off prednisone, will continue 5mg 36me.  -Breathing has been stable on supplemental oxygen and 5mg p71mnisone.  -for her recent left sided chest pain from mild progression she will continue to Hydrocodone she is on.   3. Frontal Headaches -f/u with Dr. VaslowMickeal Skinnerall stableand improved  4. Anxiety and Depression  -Currently on Xanax and Cymbalta.Stable  5. Chronic back pain  -previously on Tramadol. Better controlled on Hydrocodone 2 tabs q6hours.  -she previously tried long acting OxyContin but this did not improve her pain. She will continue with Hydrocodone which is managed by her PCP.   6. Low appetite, Nausea -Currently on Mirtazapine, omeprazole, ondansetron,prochlorperazine, and sucralfate. -Nutritional supplement -Her appetite remains low but still eats adequately and maintains weight.   7. Hypokalemia  -OnKCL 40meq 25my -Potassium normal today   8.Transaminitis -Probably related to chemotherapy, will continue monitoring. -AST 67, ALT 49, Alk Phos  334 today (11/23/18), mostly stable   9.Goal of care discussion, DNR/DNI -The patient understands the goal of care is palliative. -Wepreviouslydiscussed overall poor prognosis due to the disease progression,she has had multiple lines ofchemo, the response from future treatment will be low -We discussed DNR/DNIwhen she was recently in hospital, and she agreed DNR/DNI   Plan -Labs reviewed and adequate to proceed with Gemcitabine today, off next week  -Lab, flush, f/u and Gemcitabine in 2 weeks    No problem-specific Assessment & Plan notes found for this encounter.   No orders of the defined types were placed in this encounter.  All  questions were answered. The patient knows to call the clinic with any problems, questions or concerns. No barriers to learning was detected. I spent 15 minutes counseling the patient face to face. The total time spent in the appointment was 20 minutes and more than 50% was on counseling and review of test results     Truitt Merle, MD 11/23/2018   I, Joslyn Devon, am acting as scribe for Truitt Merle, MD.   I have reviewed the above documentation for accuracy and completeness, and I agree with the above.

## 2018-11-21 ENCOUNTER — Other Ambulatory Visit: Payer: Self-pay | Admitting: Hematology

## 2018-11-21 NOTE — Telephone Encounter (Signed)
Refill requested

## 2018-11-22 DIAGNOSIS — Z6825 Body mass index (BMI) 25.0-25.9, adult: Secondary | ICD-10-CM | POA: Diagnosis not present

## 2018-11-22 DIAGNOSIS — G894 Chronic pain syndrome: Secondary | ICD-10-CM | POA: Diagnosis not present

## 2018-11-22 DIAGNOSIS — J449 Chronic obstructive pulmonary disease, unspecified: Secondary | ICD-10-CM | POA: Diagnosis not present

## 2018-11-22 DIAGNOSIS — M1991 Primary osteoarthritis, unspecified site: Secondary | ICD-10-CM | POA: Diagnosis not present

## 2018-11-23 ENCOUNTER — Other Ambulatory Visit: Payer: Self-pay

## 2018-11-23 ENCOUNTER — Telehealth: Payer: Self-pay | Admitting: Hematology

## 2018-11-23 ENCOUNTER — Inpatient Hospital Stay: Payer: BC Managed Care – PPO

## 2018-11-23 ENCOUNTER — Inpatient Hospital Stay (HOSPITAL_BASED_OUTPATIENT_CLINIC_OR_DEPARTMENT_OTHER): Payer: BC Managed Care – PPO | Admitting: Hematology

## 2018-11-23 ENCOUNTER — Encounter: Payer: Self-pay | Admitting: Hematology

## 2018-11-23 ENCOUNTER — Inpatient Hospital Stay: Payer: BC Managed Care – PPO | Attending: Hematology

## 2018-11-23 VITALS — HR 77

## 2018-11-23 VITALS — BP 141/82 | HR 121 | Temp 98.7°F | Resp 17 | Ht 67.0 in | Wt 179.5 lb

## 2018-11-23 DIAGNOSIS — R0600 Dyspnea, unspecified: Secondary | ICD-10-CM | POA: Diagnosis not present

## 2018-11-23 DIAGNOSIS — C7989 Secondary malignant neoplasm of other specified sites: Secondary | ICD-10-CM | POA: Diagnosis not present

## 2018-11-23 DIAGNOSIS — R740 Nonspecific elevation of levels of transaminase and lactic acid dehydrogenase [LDH]: Secondary | ICD-10-CM | POA: Diagnosis not present

## 2018-11-23 DIAGNOSIS — R222 Localized swelling, mass and lump, trunk: Secondary | ICD-10-CM | POA: Diagnosis not present

## 2018-11-23 DIAGNOSIS — F329 Major depressive disorder, single episode, unspecified: Secondary | ICD-10-CM | POA: Diagnosis not present

## 2018-11-23 DIAGNOSIS — Z9884 Bariatric surgery status: Secondary | ICD-10-CM | POA: Insufficient documentation

## 2018-11-23 DIAGNOSIS — R110 Nausea: Secondary | ICD-10-CM | POA: Insufficient documentation

## 2018-11-23 DIAGNOSIS — C3490 Malignant neoplasm of unspecified part of unspecified bronchus or lung: Secondary | ICD-10-CM

## 2018-11-23 DIAGNOSIS — I700 Atherosclerosis of aorta: Secondary | ICD-10-CM | POA: Diagnosis not present

## 2018-11-23 DIAGNOSIS — E876 Hypokalemia: Secondary | ICD-10-CM | POA: Insufficient documentation

## 2018-11-23 DIAGNOSIS — Z23 Encounter for immunization: Secondary | ICD-10-CM | POA: Diagnosis not present

## 2018-11-23 DIAGNOSIS — F419 Anxiety disorder, unspecified: Secondary | ICD-10-CM | POA: Insufficient documentation

## 2018-11-23 DIAGNOSIS — R51 Headache: Secondary | ICD-10-CM | POA: Insufficient documentation

## 2018-11-23 DIAGNOSIS — Z923 Personal history of irradiation: Secondary | ICD-10-CM | POA: Diagnosis not present

## 2018-11-23 DIAGNOSIS — Z5111 Encounter for antineoplastic chemotherapy: Secondary | ICD-10-CM | POA: Insufficient documentation

## 2018-11-23 DIAGNOSIS — R05 Cough: Secondary | ICD-10-CM | POA: Diagnosis not present

## 2018-11-23 DIAGNOSIS — C7971 Secondary malignant neoplasm of right adrenal gland: Secondary | ICD-10-CM | POA: Diagnosis not present

## 2018-11-23 DIAGNOSIS — M549 Dorsalgia, unspecified: Secondary | ICD-10-CM | POA: Diagnosis not present

## 2018-11-23 DIAGNOSIS — Z885 Allergy status to narcotic agent status: Secondary | ICD-10-CM | POA: Insufficient documentation

## 2018-11-23 DIAGNOSIS — Z95828 Presence of other vascular implants and grafts: Secondary | ICD-10-CM

## 2018-11-23 DIAGNOSIS — I2510 Atherosclerotic heart disease of native coronary artery without angina pectoris: Secondary | ICD-10-CM | POA: Insufficient documentation

## 2018-11-23 DIAGNOSIS — C3431 Malignant neoplasm of lower lobe, right bronchus or lung: Secondary | ICD-10-CM | POA: Diagnosis not present

## 2018-11-23 DIAGNOSIS — J441 Chronic obstructive pulmonary disease with (acute) exacerbation: Secondary | ICD-10-CM | POA: Diagnosis not present

## 2018-11-23 DIAGNOSIS — G8929 Other chronic pain: Secondary | ICD-10-CM | POA: Diagnosis not present

## 2018-11-23 DIAGNOSIS — Z79899 Other long term (current) drug therapy: Secondary | ICD-10-CM | POA: Insufficient documentation

## 2018-11-23 DIAGNOSIS — C787 Secondary malignant neoplasm of liver and intrahepatic bile duct: Secondary | ICD-10-CM | POA: Insufficient documentation

## 2018-11-23 DIAGNOSIS — Z9221 Personal history of antineoplastic chemotherapy: Secondary | ICD-10-CM | POA: Diagnosis not present

## 2018-11-23 DIAGNOSIS — Z9049 Acquired absence of other specified parts of digestive tract: Secondary | ICD-10-CM | POA: Insufficient documentation

## 2018-11-23 LAB — CMP (CANCER CENTER ONLY)
ALT: 49 U/L — ABNORMAL HIGH (ref 0–44)
AST: 67 U/L — ABNORMAL HIGH (ref 15–41)
Albumin: 2.9 g/dL — ABNORMAL LOW (ref 3.5–5.0)
Alkaline Phosphatase: 334 U/L — ABNORMAL HIGH (ref 38–126)
Anion gap: 5 (ref 5–15)
BUN: <4 mg/dL — ABNORMAL LOW (ref 6–20)
CO2: 27 mmol/L (ref 22–32)
Calcium: 8.1 mg/dL — ABNORMAL LOW (ref 8.9–10.3)
Chloride: 101 mmol/L (ref 98–111)
Creatinine: 0.63 mg/dL (ref 0.44–1.00)
GFR, Est AFR Am: >60 mL/min (ref >60)
GFR, Estimated: >60 mL/min (ref >60)
Glucose, Bld: 107 mg/dL — ABNORMAL HIGH (ref 70–99)
Potassium: 4.4 mmol/L (ref 3.5–5.1)
Sodium: 133 mmol/L — ABNORMAL LOW (ref 135–145)
Total Bilirubin: 0.3 mg/dL (ref 0.3–1.2)
Total Protein: 6.1 g/dL — ABNORMAL LOW (ref 6.5–8.1)

## 2018-11-23 LAB — CBC WITH DIFFERENTIAL (CANCER CENTER ONLY)
Abs Immature Granulocytes: 0.04 10*3/uL (ref 0.00–0.07)
Basophils Absolute: 0 10*3/uL (ref 0.0–0.1)
Basophils Relative: 1 %
Eosinophils Absolute: 0 10*3/uL (ref 0.0–0.5)
Eosinophils Relative: 0 %
HCT: 33.9 % — ABNORMAL LOW (ref 36.0–46.0)
Hemoglobin: 10.5 g/dL — ABNORMAL LOW (ref 12.0–15.0)
Immature Granulocytes: 1 %
Lymphocytes Relative: 22 %
Lymphs Abs: 1.1 10*3/uL (ref 0.7–4.0)
MCH: 31.2 pg (ref 26.0–34.0)
MCHC: 31 g/dL (ref 30.0–36.0)
MCV: 100.6 fL — ABNORMAL HIGH (ref 80.0–100.0)
Monocytes Absolute: 0.9 10*3/uL (ref 0.1–1.0)
Monocytes Relative: 17 %
Neutro Abs: 3 10*3/uL (ref 1.7–7.7)
Neutrophils Relative %: 59 %
Platelet Count: 498 10*3/uL — ABNORMAL HIGH (ref 150–400)
RBC: 3.37 MIL/uL — ABNORMAL LOW (ref 3.87–5.11)
RDW: 18.1 % — ABNORMAL HIGH (ref 11.5–15.5)
WBC Count: 5.1 10*3/uL (ref 4.0–10.5)
nRBC: 1.2 % — ABNORMAL HIGH (ref 0.0–0.2)

## 2018-11-23 MED ORDER — SODIUM CHLORIDE 0.9 % IV SOLN
Freq: Once | INTRAVENOUS | Status: AC
  Administered 2018-11-23: 15:00:00 via INTRAVENOUS
  Filled 2018-11-23: qty 250

## 2018-11-23 MED ORDER — SODIUM CHLORIDE 0.9% FLUSH
10.0000 mL | INTRAVENOUS | Status: DC | PRN
  Administered 2018-11-23: 10 mL
  Filled 2018-11-23: qty 10

## 2018-11-23 MED ORDER — HEPARIN SOD (PORK) LOCK FLUSH 100 UNIT/ML IV SOLN
500.0000 [IU] | Freq: Once | INTRAVENOUS | Status: DC | PRN
  Filled 2018-11-23: qty 5

## 2018-11-23 MED ORDER — PROCHLORPERAZINE MALEATE 10 MG PO TABS
10.0000 mg | ORAL_TABLET | Freq: Once | ORAL | Status: AC
  Administered 2018-11-23: 15:00:00 10 mg via ORAL

## 2018-11-23 MED ORDER — PROCHLORPERAZINE MALEATE 10 MG PO TABS
ORAL_TABLET | ORAL | Status: AC
  Filled 2018-11-23: qty 1

## 2018-11-23 MED ORDER — SODIUM CHLORIDE 0.9 % IV SOLN
2000.0000 mg | Freq: Once | INTRAVENOUS | Status: AC
  Administered 2018-11-23: 17:00:00 2000 mg via INTRAVENOUS
  Filled 2018-11-23: qty 52.6

## 2018-11-23 MED ORDER — HEPARIN SOD (PORK) LOCK FLUSH 100 UNIT/ML IV SOLN
500.0000 [IU] | Freq: Once | INTRAVENOUS | Status: AC | PRN
  Administered 2018-11-23: 500 [IU]
  Filled 2018-11-23: qty 5

## 2018-11-23 MED ORDER — SODIUM CHLORIDE 0.9% FLUSH
10.0000 mL | INTRAVENOUS | Status: DC | PRN
  Administered 2018-11-23: 14:00:00 10 mL
  Filled 2018-11-23: qty 10

## 2018-11-23 NOTE — Telephone Encounter (Signed)
Scheduled appt per 9/3 los.  Per the los the 3 weeks was already scheduled, per the 8/27 los.

## 2018-11-23 NOTE — Patient Instructions (Signed)
Coronavirus (COVID-19) Are you at risk?  Are you at risk for the Coronavirus (COVID-19)?  To be considered HIGH RISK for Coronavirus (COVID-19), you have to meet the following criteria:  . Traveled to [place], [place], [place], [place] or [place]; or in the [place] to [place], [place], [place], or [place]; and have fever, cough, and shortness of breath within the last 2 weeks of travel OR . Been in close contact with a person diagnosed with COVID-19 within the last 2 weeks and have fever, cough, and shortness of breath . IF YOU DO NOT MEET THESE CRITERIA, YOU ARE CONSIDERED LOW RISK FOR COVID-19.  What to do if you are HIGH RISK for COVID-19?  Marland Kitchen If you are having a medical emergency, call 911. . Seek medical care right away. Before you go to a doctor's office, urgent care or emergency department, call ahead and tell them about your recent travel, contact with someone diagnosed with COVID-19, and your symptoms. You should receive instructions from your physician's office regarding next steps of care.  . When you arrive at healthcare provider, tell the healthcare staff immediately you have returned from visiting [place], [place], [place], [place] or [place]; or traveled in the [place] to [place], [place], [place], or [place]; in the last two weeks or you have been in close contact with a person diagnosed with COVID-19 in the last 2 weeks.   . Tell the health care staff about your symptoms: fever, cough and shortness of breath. . After you have been seen by a medical provider, you will be either: o Tested for (COVID-19) and discharged home on quarantine except to seek medical care if symptoms worsen, and asked to  - Stay home and avoid contact with others until you get your results (4-5 days)  - Avoid travel on public transportation if possible (such as bus, train, or airplane) or o Sent to the Emergency Department by EMS for evaluation, COVID-19 testing, and possible  admission depending on your condition and test results.  What to do if you are LOW RISK for COVID-19?  Reduce your risk of any infection by using the same precautions used for avoiding the common cold or flu:  Marland Kitchen Wash your hands often with soap and warm water for at least 20 seconds.  If soap and water are not readily available, use an alcohol-based hand sanitizer with at least 60% alcohol.  . If coughing or sneezing, cover your mouth and nose by coughing or sneezing into the elbow areas of your shirt or coat, into a tissue or into your sleeve (not your hands). . Avoid shaking hands with others and consider head nods or verbal greetings only. . Avoid touching your eyes, nose, or mouth with unwashed hands.  . Avoid close contact with people who are sick. . Avoid places or events with large numbers of people in one location, like concerts or sporting events. . Carefully consider travel plans you have or are making. . If you are planning any travel outside or inside the [place], visit the CDC's Travelers' Health webpage for the latest health notices. . If you have some symptoms but not all symptoms, continue to monitor at home and seek medical attention if your symptoms worsen. . If you are having a medical emergency, call 911.   [place] / e-Visit: [email or web address]         [facility]: [place]  [facility]: [facility]: [place] Discharge Instructions for Patients Receiving Chemotherapy  Today you received the following chemotherapy agents Gemcitabine  To help prevent nausea and vomiting after your treatment, we encourage you to take your nausea medication as directed.    If you develop nausea and vomiting that is not controlled by your nausea medication, call the clinic.   BELOW  ARE SYMPTOMS THAT SHOULD BE REPORTED IMMEDIATELY:  *FEVER GREATER THAN 100.5 F  *CHILLS WITH OR WITHOUT FEVER  NAUSEA AND VOMITING THAT IS NOT CONTROLLED WITH YOUR NAUSEA MEDICATION  *UNUSUAL SHORTNESS OF BREATH  *UNUSUAL BRUISING OR BLEEDING  TENDERNESS IN MOUTH AND THROAT WITH OR WITHOUT PRESENCE OF ULCERS  *URINARY PROBLEMS  *BOWEL PROBLEMS  UNUSUAL RASH Items with * indicate a potential emergency and should be followed up as soon as possible.  Feel free to call the clinic should you have any questions or concerns. The clinic phone number is [phone].  Please show the [facility] at check-in to the Emergency Department and triage nurse.

## 2018-11-24 ENCOUNTER — Other Ambulatory Visit: Payer: Self-pay

## 2018-11-24 ENCOUNTER — Encounter: Payer: Self-pay | Admitting: Hematology

## 2018-11-24 DIAGNOSIS — C3490 Malignant neoplasm of unspecified part of unspecified bronchus or lung: Secondary | ICD-10-CM

## 2018-11-24 MED ORDER — ONDANSETRON HCL 8 MG PO TABS: ORAL_TABLET | ORAL | 2 refills | Status: DC

## 2018-11-25 ENCOUNTER — Encounter: Payer: Self-pay | Admitting: Hematology

## 2018-12-04 NOTE — Progress Notes (Signed)
[place]   [phone] [phone]   Clinic Follow up Note   Patient Care Team: Redmond School, MD as PCP - General (Internal Medicine)  Date of Service:  12/07/2018  CHIEF COMPLAINT: Metastatic non-small cell lung cancer  SUMMARY OF ONCOLOGIC HISTORY: Oncology History Overview Note  Cancer Staging Metastatic non-small cell lung cancer [facility]) Staging form: Lung, AJCC 8th Edition - Clinical stage from 10/17/2017: Stage IV (cT3, cN1, cM1b) - Signed by Truitt Merle, MD on 10/17/2017     Metastatic non-small cell lung cancer ([place])  09/20/2017 Imaging   US Breast Left 09/20/17  IMPRESSION Indeterminte palpable mass measuring 3.2x1.7x2.8 cm  inferior medial to the inframammary fold of the left breast.     09/20/2017 Initial Biopsy   Diagnosis 09/20/17 Soft Tissue Needle Core Biopsy, inferior medial to IMF - ADENOCARCINOMA. - SEE MICROSCOPIC DESCRIPTION.  Microscopic Comment Immunohistochemistry will be performed and reported as an addendum. (JDP:ah 09/23/17) ADDENDUM: Immunohistochemistry shows the tumor is strongly positive with cytokeratin AE1/AE3, cytokeratin 7 and shows moderate weak positivity with CDX-2. The tumor is negative with estrogen receptor, progesterone receptor, GCDFP, GATA-3, Napsin A, TTF-1, WT-1 and cytokeratin 20. The immunophenotype is consistent with metastatic carcinoma. The combination of CDX-2 and cytokeratin 7 positivity raises the possibility of an upper gastrointestinal primary. Clinical correlation is essential.   10/06/2017 Initial Diagnosis   Metastatic adenocarcinoma involving soft tissue with unknown primary site [facility])   10/10/2017 Procedure   Upper Endoscopy by Dr. Silverio Decamp 10/10/17  IMPRESSION - Normal esophagus. - Z-line regular, 35 cm from the incisors. - Gastric bypass with a normal-sized pouch and intact staple line. Gastrojejunal anastomosis characterized by healthy appearing mucosa. - No specimens collected.  10/12/2017 Imaging   CT CAP WO Contrast 10/12/17  IMPRESSION: 7 cm central right lung mass involving the right hilum, with postobstructive collapse of the right middle lobe, highly suspicious for primary bronchogenic carcinoma. 5 cm masslike opacity in the superior segment of the right lower lobe may represent carcinoma or postobstructive pneumonitis. 3.3 cm soft tissue mass in the lower anterior chest wall soft tissues, suspicious for metastatic disease. No evidence of abdominal or pelvic metastatic disease.    10/14/2017 Imaging   MRI Brain 10/14/17  IMPRESSION: 1. No metastatic disease or acute intracranial abnormality. Normal MRI appearance of the brain. 2. 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     10/17/2017 Cancer Staging   Staging form: Lung, AJCC 8th Edition - Clinical stage from 10/17/2017: Stage IV (cT3, cN1, cM1b) - Signed by Truitt Merle, MD on 10/17/2017   10/24/2017 - 11/04/2017 Radiation Therapy    The Right lung mass was treated to 30 Gy in 10 fractions of 3 Gy   11/09/2017 - 03/24/2018 Chemotherapy   -first line chemo with carboplatin, paclitaxel, Atezolizumab and bevacizumab every 3weeks starting 11/08/17.  -Switched to maintenance therapy with avastin and atezolizumab q3weeks on 03/24/18   01/10/2018 Imaging   01/10/2018 CT CA IMPRESSION: 1. Response to therapy of central right lower lobe lung mass. Re-expansion of the right middle lobe with significantly improved right lower lobe postobstructive pneumonitis. Residual geographic airspace and ground-glass opacity within the medial right lung, primarily favored to be radiation induced. 2. Near complete resolution of presternal soft tissue mass. 3. New right adrenal nodule since 10/11/2017, suspicious for metastatic disease. 4. Right-sided Port-A-Cath with probable nonocclusive thrombus along the catheter within the right brachiocephalic vein. 5. Age advanced coronary artery atherosclerosis. Recommend  assessment of coronary risk factors and consideration  of medical therapy. 6. Aortic atherosclerosis (ICD10-I70.0) and emphysema (ICD10-J43.9).   03/10/2018 Imaging   CT CAP W Contrast 03/10/18 IMPRESSION: 1. Radiation changes involving the right paramediastinal lung and right hilum but no findings suspicious for residual or recurrent tumor. 2. No evidence of pulmonary metastatic disease. Stable emphysematous changes and areas of pulmonary scarring. 3. Stable 11 mm right adrenal gland nodule. 4. No findings for metastatic disease involving the liver or bony structures.   03/24/2018 - 04/24/2018 Chemotherapy   The patient had bevacizumab (AVASTIN) 1,300 mg in sodium chloride 0.9 % 100 mL chemo infusion, 15.5 mg/kg = 1,275 mg, Intravenous,  Once, 1 of 4 cycles Administration: 1,300 mg (03/24/2018) atezolizumab (TECENTRIQ) 1,200 mg in sodium chloride 0.9 % 250 mL chemo infusion, 1,200 mg, Intravenous, Once, 1 of 4 cycles Administration: 1,200 mg (03/24/2018)  for chemotherapy treatment.    04/11/2018 Imaging   CT ANGIO CHEST PE W OR WO CONTRAST  IMPRESSION: 1. No evidence of acute pulmonary embolism. 2. Progressive radiation changes in the right perihilar region. Cavitary retro hilar mass and right paratracheal lymph node have enlarged, suspicious for local recurrence/disease progression. 3. Enlarging right adrenal nodule suspicious for metastatic disease. 4. New right pleural effusion with increased atelectasis at both lung bases. 5. These results will be called to the ordering clinician or representative by the Radiology Department at the imaging location.    04/21/2018 Imaging   NUCLEAR MEDICINE PET SKULL BASE TO THIGH IMPRESSION: 1. Marked hypermetabolism in the amorphous soft tissue involving the right hilum. This represents the central component of the apparent radiation scarring. 2. Hypermetabolic mediastinal nodal metastases with nodal metastases identified in the anterior  right juxta diaphragmatic fat. 3. Hypermetabolic right adrenal metastasis. 4. Interval progression of loculated right pleural effusion.    05/02/2018 - 07/25/2018 Chemotherapy   Second line Alimta 541m/m2 every 3 weeks, started 05/02/2018. D/c due to disease pregression after 4 cycles     07/21/2018 Imaging   CT CAP 07/21/18  IMPRESSION: 1. Enlarging right adrenal metastatic lesion a mildly enlarging mediastinal adenopathy compatible with mild progression of disease. No new metastatic foci are identified. 2. Evolutionary findings along the right perihilar/paramediastinal radiation fibrosis including some improvement in aeration in the right upper lobe, but enlargement of the cavitary/centrally necrotic region in the superior segment right lower lobe, which is gas-filled and which probably connects to the otherwise focally occluded bronchus intermedius. 3. Other imaging findings of potential clinical significance: Probable left anterior descending coronary atherosclerosis. Mild to moderate intrahepatic biliary dilatation mild extrahepatic biliary dilatation, much of which may be a physiologic response to cholecystectomy. Prominent stool throughout the colon favors constipation. Aortic Atherosclerosis (ICD10-I70.0).   07/26/2018 - 08/17/2018 Chemotherapy   Third-line docetaxel and cyramza q3 weeks on 07/26/18. D/c after cycle 2 due to PNA and mild disease progression   09/07/2018 Imaging   CT Angio Chest 09/07/18  IMPRESSION: 1. Detection of pulmonary emboli is severely limited by motion artifact and suboptimal contrast bolus timing. Given this limitation, no PE was identified. The pulmonary arteries are dilated which can be seen in patients with elevated pulmonary artery pressures. 2. Diffuse ground-glass airspace opacities involving the right upper lobe, left upper lobe, and left lower lobe. Findings are concerning for multifocal pneumonia (viral or bacterial). 3. Persistent ill-defined  soft tissue in the right perihilar region with multiple pathologically enlarged mediastinal and hilar lymph nodes. There is a growing air-filled cavitary area within the right perihilar region which may be related to post treatment changes.  4. Small right-sided pleural effusion. 5. Growing subcutaneous soft tissue nodule in the anterior midline left chest wall as detailed above. This could represent a metastatic deposit. Again noted is a right adrenal metastatic lesion as detailed above.   Aortic Atherosclerosis (ICD10-I70.0).   09/07/2018 - 09/14/2018 Hospital Admission   Admit date: 09/07/18 Admission diagnosis: COPD exacerbation, PNA Additional comments: After 3 cycles of third-line chemo she was admitted for COPD and PNA. She was placed on oxygen and treated with antibotics.    10/05/2018 -  Chemotherapy   Fourth-line Weekly Gemcitabine starting 10/05/18   10/11/2018 Imaging   CT AP W Contrast  IMPRESSION: 1. Enlarging right adrenal gland metastatic lesion. 2. Enlarging subcutaneous soft tissue lesions in the lower anterior chest wall and the anterior abdominal wall. 3. New soft tissue lesion noted in the lower aspect of the right ischial rectal fossa. 4. Stable intra and extrahepatic biliary dilatation. No findings for hepatic metastatic disease. 5. No acute abdominal/pelvic findings or lymphadenopathy. 6. No definite bone lesions are identified. Specifically I do not see a definite cause for the patient's right hip pain. MRI would be suggested for further evaluation.   11/13/2018 Imaging   CT CAP IMPRESSION: 1. Mild mixed interval changes. Mild right paratracheal adenopathy and small soft tissue metastasis in the supraumbilical right abdominal wall have mildly decreased. Right perihilar soft tissue, ventral left chest wall soft tissue metastasis and right adrenal metastasis have slightly increased. New 5 mm apical left upper lobe pulmonary nodule, metastasis not excluded. 2.  Small right pleural effusion, slightly increased. 3.  Aortic Atherosclerosis (ICD10-I70.0).      CURRENT THERAPY:  Fourth-line Gemcitabine 2 weeks on/1 week off starting7/16/20  INTERVAL HISTORY:  Yvonne Lang is here for a follow up and treatment. She presents to the clinic alone. She feels her chest nodule is getting bigger. She is still on '5mg'$  Prednisone. She notes she makes sure she eats daily. She also feels a lump on her groin which is sore.    REVIEW OF SYSTEMS:   Constitutional: Denies fevers, chills or abnormal weight loss Eyes: Denies blurriness of vision Ears, nose, mouth, throat, and face: Denies mucositis or sore throat Respiratory: Denies cough, dyspnea or wheezes Cardiovascular: Denies palpitation, chest discomfort or lower extremity swelling Gastrointestinal:  Denies nausea, heartburn or change in bowel habits Skin: Denies abnormal skin rashes (+) Mid chest nodule tenderness and bigger (+) Right groin nodule tender Lymphatics: Denies new lymphadenopathy or easy bruising Neurological:Denies numbness, tingling or new weaknesses Behavioral/Psych: Mood is stable, no new changes  All other systems were reviewed with the patient and are negative.  MEDICAL HISTORY:  Past Medical History:  Diagnosis Date  . Anxiety   . Bronchitis   . COPD (chronic obstructive pulmonary disease) ([place])   . Depression   . GERD (gastroesophageal reflux disease)   . H/O gastric bypass    1999  . Irritable bowel syndrome   . Lung cancer [facility])     SURGICAL HISTORY: Past Surgical History:  Procedure Laterality Date  . BACK SURGERY    . CHOLECYSTECTOMY    . COLON SURGERY     for a kink in her colon and 12 inchs removed  . COLONOSCOPY N/A 12/13/2013   Procedure: COLONOSCOPY;  Surgeon: Rogene Houston, MD;  Location: [facility];  Service: Endoscopy;  Laterality: N/A;  200  . ESOPHAGOGASTRODUODENOSCOPY N/A 12/13/2013   Procedure: ESOPHAGOGASTRODUODENOSCOPY (EGD);  Surgeon: Rogene Houston, MD;  Location: [facility];  Service:  Endoscopy;  Laterality: N/A;  . Gastric Bypas  2000  . IR IMAGING GUIDED PORT INSERTION  11/14/2017    I have reviewed the social history and family history with the patient and they are unchanged from previous note.  ALLERGIES:  is allergic to codeine.  MEDICATIONS:  Current Outpatient Medications  Medication Sig Dispense Refill  . albuterol (PROVENTIL HFA) 108 (90 Base) MCG/ACT inhaler Inhale 2 puffs into the lungs 2 (two) times daily. 0.006 g 1  . ALPRAZolam (XANAX) 0.25 MG tablet Take 1 tablet (0.25 mg total) by mouth at bedtime as needed for anxiety or sleep. 30 tablet 0  . butalbital-acetaminophen-caffeine (FIORICET, ESGIC) 50-325-40 MG tablet Take 1-2 tablets by mouth every 6 (six) hours as needed for headache. 60 tablet 1  . CALCIUM-MAGNESIUM-ZINC PO Take 1 tablet by mouth daily.    Marland Kitchen dexamethasone (DECADRON) 4 MG tablet Take 2 tablets (8 mg total) by mouth 2 (two) times daily. Start the day before Taxotere, then take on day after chemo for 2 days 30 tablet 1  . dicyclomine (BENTYL) 20 MG tablet Take 1 tablet (20 mg total) by mouth every 6 (six) hours. 60 tablet 1  . FLUoxetine (PROZAC) 40 MG capsule Take 80 mg by mouth daily.    . furosemide (LASIX) 20 MG tablet Take 1 tablet (20 mg total) by mouth daily as needed. (Patient taking differently: Take 20 mg by mouth daily as needed for fluid or edema. ) 30 tablet 0  . gabapentin (NEURONTIN) 300 MG capsule Take 300 mg by mouth daily as needed (for pain).   4  . guaiFENesin (MUCINEX) 600 MG 12 hr tablet Take 1 tablet (600 mg total) by mouth 2 (two) times daily. 60 tablet 0  . HYDROcodone-acetaminophen (NORCO) 10-325 MG tablet Take 2 tablets by mouth every 6 (six) hours as needed (For pain.).     Marland Kitchen ipratropium-albuterol (DUONEB) 0.5-2.5 (3) MG/3ML SOLN Take 3 mLs by nebulization every 4 (four) hours as needed. (Patient taking differently: Take 3 mLs by nebulization every 4 (four) hours as  needed (for shortness of breath). ) 360 mL 1  . lidocaine-prilocaine (EMLA) cream APPLY 1 APPLICATION TO PORT TOPICALLY AS NEEDED ONE HOUR PRIOR TO PORT ACCESS/CHEMO (Patient taking differently: Apply 1 application topically as needed (For port-a-cath.). ) 30 g 0  . magic mouthwash w/lidocaine SOLN Take 5 mLs by mouth 3 (three) times daily as needed for mouth pain. 240 mL 0  . Multiple Vitamin (MULTIVITAMIN) tablet Take 1 tablet by mouth daily.    Marland Kitchen nystatin (MYCOSTATIN) 100000 UNIT/ML suspension SWISH ORALLY THEN SPIT WITH 5 ML THREE TIMES DAILY (Patient taking differently: Use as directed 5 mLs in the mouth or throat 3 (three) times daily. ) 240 mL 0  . omeprazole (PRILOSEC) 20 MG capsule Take 1 capsule (20 mg total) by mouth 2 (two) times daily before a meal. 60 capsule 0  . ondansetron (ZOFRAN) 8 MG tablet Take one tablet every 8 hours as needed for nause 30 tablet 2  . oxyCODONE (OXYCONTIN) 20 mg 12 hr tablet Take 1 tablet (20 mg total) by mouth every 12 (twelve) hours. 60 tablet 0  . potassium chloride SA (K-DUR) 20 MEQ tablet Take 1 tablet by mouth once daily 30 tablet 0  . predniSONE (DELTASONE) 10 MG tablet Take 0.5 tablets daily 30 tablet 0  . prochlorperazine (COMPAZINE) 10 MG tablet TAKE 1 TABLET BY MOUTH EVERY 6 HOURS AS NEEDED FOR NAUSEA FOR VOMITING 30 tablet 0  .  rOPINIRole (REQUIP) 0.5 MG tablet Take 0.5 mg by mouth at bedtime as needed (For restless legs.).   11  . sucralfate (CARAFATE) 1 g tablet TAKE 1 TABLET(1 GRAM) BY MOUTH FOUR TIMES DAILY 120 tablet 0  . TRELEGY ELLIPTA 100-62.5-25 MCG/INH AEPB Inhale 1 puff into the lungs daily.   5  . zolpidem (AMBIEN) 10 MG tablet Take 10 mg by mouth at bedtime.      Current Facility-Administered Medications  Medication Dose Route Frequency Provider Last Rate Last Dose  . influenza vac split quadrivalent PF (FLUARIX) injection 0.5 mL  0.5 mL Intramuscular Once Truitt Merle, MD        PHYSICAL EXAMINATION: ECOG PERFORMANCE STATUS: 2 -  Symptomatic, <50% confined to bed  Vitals:   12/07/18 1436  BP: 135/81  Pulse: 93  Resp: 18  Temp: 98 F (36.7 C)  SpO2: 94%   Filed Weights   12/07/18 1436  Weight: 184 lb 6.4 oz (83.6 kg)    GENERAL:alert, no distress and comfortable SKIN: skin color, texture, turgor are normal, no rashes or significant lesions (+) subcutaneous 3x3cm mid left lower chest nodule (previously 2x2.5cm) with tenderness (+) 1cm Subcutaneous nodule at right suprapubic area, tender EYES: normal, Conjunctiva are pink and non-injected, sclera clear  NECK: supple, thyroid normal size, non-tender, without nodularity LYMPH:  no palpable lymphadenopathy in the cervical, axillary or inguinal LUNGS: clear to percussion (+) B/l wheezing (+) On oxygen canula  HEART: regular rate & rhythm and no murmurs and no lower extremity edema ABDOMEN:abdomen soft, non-tender and normal bowel sounds Musculoskeletal:no cyanosis of digits and no clubbing  NEURO: alert & oriented x 3 with fluent speech, no focal motor/sensory deficits  LABORATORY DATA:  I have reviewed the data as listed CBC Latest Ref Rng & Units 12/07/2018 11/23/2018 11/16/2018  WBC 4.0 - 10.5 K/uL 10.0 5.1 10.0  Hemoglobin 12.0 - 15.0 g/dL 9.7(L) 10.5(L) 9.7(L)  Hematocrit 36.0 - 46.0 % 31.4(L) 33.9(L) 31.4(L)  Platelets 150 - 400 K/uL 553(H) 498(H) 707(H)     CMP Latest Ref Rng & Units 12/07/2018 11/23/2018 11/16/2018  Glucose 70 - 99 mg/dL 99 107(H) 111(H)  BUN 6 - 20 mg/dL 4(L) <4(L) 7  Creatinine 0.44 - 1.00 mg/dL 0.59 0.63 0.72  Sodium 135 - 145 mmol/L 142 133(L) 140  Potassium 3.5 - 5.1 mmol/L 3.6 4.4 5.3(H)  Chloride 98 - 111 mmol/L 104 101 103  CO2 22 - 32 mmol/L '30 27 29  '$ Calcium 8.9 - 10.3 mg/dL 7.8(L) 8.1(L) 8.3(L)  Total Protein 6.5 - 8.1 g/dL 5.1(L) 6.1(L) 5.6(L)  Total Bilirubin 0.3 - 1.2 mg/dL <0.2(L) 0.3 0.3  Alkaline Phos 38 - 126 U/L 281(H) 334(H) 221(H)  AST 15 - 41 U/L 30 67(H) 21  ALT 0 - 44 U/L 24 49(H) 23      RADIOGRAPHIC  STUDIES: I have personally reviewed the radiological images as listed and agreed with the findings in the report. No results found.   ASSESSMENT & PLAN:  Markeesha Char is a 53 y.o. female with   1. 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 -Diagnosed in 09/2017. Treated with chemo and radiation.  -she has progressed through multiple line chemo, including immunotherapy  -I started her on fourth line Gemcitabine 2 weeks on/1 week off on 10/05/18.She tolerating moderately well with mild nausea, manageable.  -Based on her 11/13/18 CT CAP, althoughher subcutaneous left lower chest wall nodule has increased from September 07, 2018, it has been stable since that  she started gemcitabine in mid July.Other lesions are overall stable. She is tolerating gemcitabine well, I recommend tocontinuefor disease control.She agrees.  -Physical exam today shows mild increase in her subcutaneous mid chest nodule, now 3cm. She also has 1cm right suprapubic subcutaneous nodule. Will continue to monitor.  -Labs reviewed, CBC and CMP WNL except Hg 9.7, plt 553, Ca 7.8, protein 5.1, albumin 2.5, alk phos 281. Overall adequate to proceed with cycle 4 Gemcitabine today -Will set date for next scan at next visit, probably after next cycle chemo  -She opted to receive flu shot today   2.Dyspnea and dry cough secondary to her COPD,right lung cancer and radiation changes  -Overall stable, she will continuebronchodilator -In 08/2017 she had recent COPD exacerbation andPneumoniainfection. She was treated inpatient with oxygen, antibiotics and steroids. -She was discharged on 3L oxygen canula and '40mg'$  prednisone daily and breathing treatment TID.  -Starting 09/22/18, I will have her reduce prednisone to '30mg'$  and continue to very slowly ween her off.  -She still has Dyspnea upon mild activities. Dry cough persists. -Her breathing is stable on'5mg'$  prednisone and she was able to reduce  her oxygen from 2L.She has struggled to wean off prednisone, will continue '5mg'$  dose.  -Breathing has been stable on supplemental oxygen and '5mg'$  prednisone.  -for her recent left sided chest pain from mild progression she will continue to Hydrocodone she is on.  -She continues to have b/l wheezes on exam (12/07/18). I encouraged her to continue breathing treatment at home.   3. Frontal Headaches -f/u with Dr. Mickeal Skinner -overall stableand improved  4. Anxiety and Depression  -Currently on Xanax and Cymbalta.Stable  5. Chronic back pain  -previously on Tramadol. Better controlled on Hydrocodone 2 tabs q6hours.  -she previously tried long acting OxyContin but this did not improve her pain. She will continue with Hydrocodone which is managed by her PCP.   6. Low appetite, Nausea -Currently on Mirtazapine, omeprazole, ondansetron,prochlorperazine, and sucralfate. -Nutritional supplement -Her appetite remains low but still eats adequately and has been able to maintain or gain weight.  -protein 5.1 and albumin 2.5 today (12/07/18)  7. Hypokalemia  -OnKCL 12mq daily -Potassium normal today   8.Transaminitis -Probably related to chemotherapy, will continue monitoring. -Much improved today, AST and ALT normal, Alk Phos 281.    9.Goal of care discussion, DNR/DNI -The patient understands the goal of care is palliative. -Wepreviouslydiscussed overall poor prognosis due to the disease progression,she has had multiple lines ofchemo, the response from future treatment will be low -We discussed DNR/DNIwhen she was recently in hospital, and she agreed DNR/DNI  Plan -Flu shot today  -I refilled Zofran today  -Labs reviewed and adequate to proceed with cycle 4 Gemcitabine today and next week -Lab, flush, f/u and Gemcitabinein 1 week   No problem-specific Assessment & Plan notes found for this encounter.   No orders of the defined types were placed in this encounter.   All questions were answered. The patient knows to call the clinic with any problems, questions or concerns. No barriers to learning was detected. I spent 20 minutes counseling the patient face to face. The total time spent in the appointment was 25 minutes and more than 50% was on counseling and review of test results     YTruitt Merle MD 12/07/2018   I, AJoslyn Devon am acting as scribe for YTruitt Merle MD.   I have reviewed the above documentation for accuracy and completeness, and I agree with the above.

## 2018-12-07 ENCOUNTER — Encounter: Payer: Self-pay | Admitting: Hematology

## 2018-12-07 ENCOUNTER — Other Ambulatory Visit: Payer: Self-pay

## 2018-12-07 ENCOUNTER — Inpatient Hospital Stay: Payer: BC Managed Care – PPO

## 2018-12-07 ENCOUNTER — Telehealth: Payer: Self-pay | Admitting: Hematology

## 2018-12-07 ENCOUNTER — Inpatient Hospital Stay (HOSPITAL_BASED_OUTPATIENT_CLINIC_OR_DEPARTMENT_OTHER): Payer: BC Managed Care – PPO | Admitting: Hematology

## 2018-12-07 VITALS — BP 135/81 | HR 93 | Temp 98.0°F | Resp 18 | Ht 67.0 in | Wt 184.4 lb

## 2018-12-07 DIAGNOSIS — Z95828 Presence of other vascular implants and grafts: Secondary | ICD-10-CM

## 2018-12-07 DIAGNOSIS — C3490 Malignant neoplasm of unspecified part of unspecified bronchus or lung: Secondary | ICD-10-CM

## 2018-12-07 DIAGNOSIS — R0600 Dyspnea, unspecified: Secondary | ICD-10-CM | POA: Diagnosis not present

## 2018-12-07 DIAGNOSIS — Z5111 Encounter for antineoplastic chemotherapy: Secondary | ICD-10-CM | POA: Diagnosis not present

## 2018-12-07 DIAGNOSIS — G8929 Other chronic pain: Secondary | ICD-10-CM | POA: Diagnosis not present

## 2018-12-07 DIAGNOSIS — C7989 Secondary malignant neoplasm of other specified sites: Secondary | ICD-10-CM | POA: Diagnosis not present

## 2018-12-07 DIAGNOSIS — R51 Headache: Secondary | ICD-10-CM | POA: Diagnosis not present

## 2018-12-07 DIAGNOSIS — Z23 Encounter for immunization: Secondary | ICD-10-CM

## 2018-12-07 DIAGNOSIS — E876 Hypokalemia: Secondary | ICD-10-CM | POA: Diagnosis not present

## 2018-12-07 DIAGNOSIS — C3431 Malignant neoplasm of lower lobe, right bronchus or lung: Secondary | ICD-10-CM | POA: Diagnosis not present

## 2018-12-07 DIAGNOSIS — M549 Dorsalgia, unspecified: Secondary | ICD-10-CM | POA: Diagnosis not present

## 2018-12-07 DIAGNOSIS — R05 Cough: Secondary | ICD-10-CM | POA: Diagnosis not present

## 2018-12-07 DIAGNOSIS — I700 Atherosclerosis of aorta: Secondary | ICD-10-CM | POA: Diagnosis not present

## 2018-12-07 DIAGNOSIS — C7971 Secondary malignant neoplasm of right adrenal gland: Secondary | ICD-10-CM | POA: Diagnosis not present

## 2018-12-07 DIAGNOSIS — R740 Nonspecific elevation of levels of transaminase and lactic acid dehydrogenase [LDH]: Secondary | ICD-10-CM | POA: Diagnosis not present

## 2018-12-07 DIAGNOSIS — R222 Localized swelling, mass and lump, trunk: Secondary | ICD-10-CM | POA: Diagnosis not present

## 2018-12-07 DIAGNOSIS — R110 Nausea: Secondary | ICD-10-CM | POA: Diagnosis not present

## 2018-12-07 DIAGNOSIS — C787 Secondary malignant neoplasm of liver and intrahepatic bile duct: Secondary | ICD-10-CM | POA: Diagnosis not present

## 2018-12-07 LAB — CMP (CANCER CENTER ONLY)
ALT: 24 U/L (ref 0–44)
AST: 30 U/L (ref 15–41)
Albumin: 2.5 g/dL — ABNORMAL LOW (ref 3.5–5.0)
Alkaline Phosphatase: 281 U/L — ABNORMAL HIGH (ref 38–126)
Anion gap: 8 (ref 5–15)
BUN: 4 mg/dL — ABNORMAL LOW (ref 6–20)
CO2: 30 mmol/L (ref 22–32)
Calcium: 7.8 mg/dL — ABNORMAL LOW (ref 8.9–10.3)
Chloride: 104 mmol/L (ref 98–111)
Creatinine: 0.59 mg/dL (ref 0.44–1.00)
GFR, Est AFR Am: >60 mL/min (ref >60)
GFR, Estimated: >60 mL/min (ref >60)
Glucose, Bld: 99 mg/dL (ref 70–99)
Potassium: 3.6 mmol/L (ref 3.5–5.1)
Sodium: 142 mmol/L (ref 135–145)
Total Bilirubin: <0.2 mg/dL — ABNORMAL LOW (ref 0.3–1.2)
Total Protein: 5.1 g/dL — ABNORMAL LOW (ref 6.5–8.1)

## 2018-12-07 LAB — CBC WITH DIFFERENTIAL (CANCER CENTER ONLY)
Abs Immature Granulocytes: 0.04 10*3/uL (ref 0.00–0.07)
Basophils Absolute: 0 10*3/uL (ref 0.0–0.1)
Basophils Relative: 0 %
Eosinophils Absolute: 0.1 10*3/uL (ref 0.0–0.5)
Eosinophils Relative: 1 %
HCT: 31.4 % — ABNORMAL LOW (ref 36.0–46.0)
Hemoglobin: 9.7 g/dL — ABNORMAL LOW (ref 12.0–15.0)
Immature Granulocytes: 0 %
Lymphocytes Relative: 10 %
Lymphs Abs: 1 10*3/uL (ref 0.7–4.0)
MCH: 32 pg (ref 26.0–34.0)
MCHC: 30.9 g/dL (ref 30.0–36.0)
MCV: 103.6 fL — ABNORMAL HIGH (ref 80.0–100.0)
Monocytes Absolute: 1.5 10*3/uL — ABNORMAL HIGH (ref 0.1–1.0)
Monocytes Relative: 15 %
Neutro Abs: 7.3 10*3/uL (ref 1.7–7.7)
Neutrophils Relative %: 74 %
Platelet Count: 553 10*3/uL — ABNORMAL HIGH (ref 150–400)
RBC: 3.03 MIL/uL — ABNORMAL LOW (ref 3.87–5.11)
RDW: 19.9 % — ABNORMAL HIGH (ref 11.5–15.5)
WBC Count: 10 10*3/uL (ref 4.0–10.5)
nRBC: 0 % (ref 0.0–0.2)

## 2018-12-07 MED ORDER — SODIUM CHLORIDE 0.9 % IV SOLN
2000.0000 mg | Freq: Once | INTRAVENOUS | Status: AC
  Administered 2018-12-07: 2000 mg via INTRAVENOUS
  Filled 2018-12-07: qty 52.6

## 2018-12-07 MED ORDER — SODIUM CHLORIDE 0.9 % IV SOLN
Freq: Once | INTRAVENOUS | Status: AC
  Administered 2018-12-07: 15:00:00 via INTRAVENOUS
  Filled 2018-12-07: qty 250

## 2018-12-07 MED ORDER — INFLUENZA VAC SPLIT QUAD 0.5 ML IM SUSY: 0.5000 mL | PREFILLED_SYRINGE | Freq: Once | INTRAMUSCULAR | Status: DC

## 2018-12-07 MED ORDER — SODIUM CHLORIDE 0.9% FLUSH
10.0000 mL | INTRAVENOUS | Status: DC | PRN
  Administered 2018-12-07: 14:00:00 10 mL
  Filled 2018-12-07: qty 10

## 2018-12-07 MED ORDER — PROCHLORPERAZINE MALEATE 10 MG PO TABS
10.0000 mg | ORAL_TABLET | Freq: Once | ORAL | Status: AC
  Administered 2018-12-07: 10 mg via ORAL

## 2018-12-07 MED ORDER — HEPARIN SOD (PORK) LOCK FLUSH 100 UNIT/ML IV SOLN
500.0000 [IU] | Freq: Once | INTRAVENOUS | Status: AC | PRN
  Administered 2018-12-07: 17:00:00 500 [IU]
  Filled 2018-12-07: qty 5

## 2018-12-07 MED ORDER — PROCHLORPERAZINE MALEATE 10 MG PO TABS
ORAL_TABLET | ORAL | Status: AC
  Filled 2018-12-07: qty 1

## 2018-12-07 MED ORDER — SODIUM CHLORIDE 0.9% FLUSH
10.0000 mL | INTRAVENOUS | Status: DC | PRN
  Administered 2018-12-07: 17:00:00 10 mL
  Filled 2018-12-07: qty 10

## 2018-12-07 NOTE — Patient Instructions (Signed)
[facility] Discharge Instructions for Patients Receiving Chemotherapy  Today you received the following chemotherapy agents:  Gemcitabine  To help prevent nausea and vomiting after your treatment, we encourage you to take your nausea medication as prescribed.    If you develop nausea and vomiting that is not controlled by your nausea medication, call the clinic.   BELOW ARE SYMPTOMS THAT SHOULD BE REPORTED IMMEDIATELY:  *FEVER GREATER THAN 100.5 F  *CHILLS WITH OR WITHOUT FEVER  NAUSEA AND VOMITING THAT IS NOT CONTROLLED WITH YOUR NAUSEA MEDICATION  *UNUSUAL SHORTNESS OF BREATH  *UNUSUAL BRUISING OR BLEEDING  TENDERNESS IN MOUTH AND THROAT WITH OR WITHOUT PRESENCE OF ULCERS  *URINARY PROBLEMS  *BOWEL PROBLEMS  UNUSUAL RASH Items with * indicate a potential emergency and should be followed up as soon as possible.  Feel free to call the clinic should you have any questions or concerns. The clinic phone number is [phone].  Please show the CHEMO ALERT CARD at check-in to the Emergency Department and triage nurse.   

## 2018-12-07 NOTE — Telephone Encounter (Signed)
Scheduled appt per 9/17 los.

## 2018-12-11 NOTE — Progress Notes (Signed)
[place]   [phone] Fax:([phone]   Clinic Follow up Note   Patient Care Team: Redmond School, MD as PCP - General (Internal Medicine)  Date of Service:  12/14/2018  CHIEF COMPLAINT:  Metastatic non-small cell lung cancer  SUMMARY OF ONCOLOGIC HISTORY: Oncology History Overview Note  Cancer Staging Metastatic non-small cell lung cancer [facility]) Staging form: Lung, AJCC 8th Edition - Clinical stage from 10/17/2017: Stage IV (cT3, cN1, cM1b) - Signed by Truitt Merle, MD on 10/17/2017     Metastatic non-small cell lung cancer ([place])  09/20/2017 Imaging   US Breast Left 09/20/17  IMPRESSION Indeterminte palpable mass measuring 3.2x1.7x2.8 cm  inferior medial to the inframammary fold of the left breast.     09/20/2017 Initial Biopsy   Diagnosis 09/20/17 Soft Tissue Needle Core Biopsy, inferior medial to IMF - ADENOCARCINOMA. - SEE MICROSCOPIC DESCRIPTION.  Microscopic Comment Immunohistochemistry will be performed and reported as an addendum. (JDP:ah 09/23/17) ADDENDUM: Immunohistochemistry shows the tumor is strongly positive with cytokeratin AE1/AE3, cytokeratin 7 and shows moderate weak positivity with CDX-2. The tumor is negative with estrogen receptor, progesterone receptor, GCDFP, GATA-3, Napsin A, TTF-1, WT-1 and cytokeratin 20. The immunophenotype is consistent with metastatic carcinoma. The combination of CDX-2 and cytokeratin 7 positivity raises the possibility of an upper gastrointestinal primary. Clinical correlation is essential.   10/06/2017 Initial Diagnosis   Metastatic adenocarcinoma involving soft tissue with unknown primary site [facility])   10/10/2017 Procedure   Upper Endoscopy by Dr. Silverio Decamp 10/10/17  IMPRESSION - Normal esophagus. - Z-line regular, 35 cm from the incisors. - Gastric bypass with a normal-sized pouch and intact staple line. Gastrojejunal anastomosis characterized by healthy appearing mucosa. - No specimens collected.   10/12/2017 Imaging   CT CAP WO Contrast 10/12/17  IMPRESSION: 7 cm central right lung mass involving the right hilum, with postobstructive collapse of the right middle lobe, highly suspicious for primary bronchogenic carcinoma. 5 cm masslike opacity in the superior segment of the right lower lobe may represent carcinoma or postobstructive pneumonitis. 3.3 cm soft tissue mass in the lower anterior chest wall soft tissues, suspicious for metastatic disease. No evidence of abdominal or pelvic metastatic disease.    10/14/2017 Imaging   MRI Brain 10/14/17  IMPRESSION: 1. No metastatic disease or acute intracranial abnormality. Normal MRI appearance of the brain. 2. 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     10/17/2017 Cancer Staging   Staging form: Lung, AJCC 8th Edition - Clinical stage from 10/17/2017: Stage IV (cT3, cN1, cM1b) - Signed by Truitt Merle, MD on 10/17/2017   10/24/2017 - 11/04/2017 Radiation Therapy    The Right lung mass was treated to 30 Gy in 10 fractions of 3 Gy   11/09/2017 - 03/24/2018 Chemotherapy   -first line chemo with carboplatin, paclitaxel, Atezolizumab and bevacizumab every 3weeks starting 11/08/17.  -Switched to maintenance therapy with avastin and atezolizumab q3weeks on 03/24/18   01/10/2018 Imaging   01/10/2018 CT CA IMPRESSION: 1. Response to therapy of central right lower lobe lung mass. Re-expansion of the right middle lobe with significantly improved right lower lobe postobstructive pneumonitis. Residual geographic airspace and ground-glass opacity within the medial right lung, primarily favored to be radiation induced. 2. Near complete resolution of presternal soft tissue mass. 3. New right adrenal nodule since 10/11/2017, suspicious for metastatic disease. 4. Right-sided Port-A-Cath with probable nonocclusive thrombus along the catheter within the right brachiocephalic vein. 5. Age advanced coronary artery atherosclerosis. Recommend  assessment of coronary risk factors  and consideration of medical therapy. 6. Aortic atherosclerosis (ICD10-I70.0) and emphysema (ICD10-J43.9).   03/10/2018 Imaging   CT CAP W Contrast 03/10/18 IMPRESSION: 1. Radiation changes involving the right paramediastinal lung and right hilum but no findings suspicious for residual or recurrent tumor. 2. No evidence of pulmonary metastatic disease. Stable emphysematous changes and areas of pulmonary scarring. 3. Stable 11 mm right adrenal gland nodule. 4. No findings for metastatic disease involving the liver or bony structures.   03/24/2018 - 04/24/2018 Chemotherapy   The patient had bevacizumab (AVASTIN) 1,300 mg in sodium chloride 0.9 % 100 mL chemo infusion, 15.5 mg/kg = 1,275 mg, Intravenous,  Once, 1 of 4 cycles Administration: 1,300 mg (03/24/2018) atezolizumab (TECENTRIQ) 1,200 mg in sodium chloride 0.9 % 250 mL chemo infusion, 1,200 mg, Intravenous, Once, 1 of 4 cycles Administration: 1,200 mg (03/24/2018)  for chemotherapy treatment.    04/11/2018 Imaging   CT ANGIO CHEST PE W OR WO CONTRAST  IMPRESSION: 1. No evidence of acute pulmonary embolism. 2. Progressive radiation changes in the right perihilar region. Cavitary retro hilar mass and right paratracheal lymph node have enlarged, suspicious for local recurrence/disease progression. 3. Enlarging right adrenal nodule suspicious for metastatic disease. 4. New right pleural effusion with increased atelectasis at both lung bases. 5. These results will be called to the ordering clinician or representative by the Radiology Department at the imaging location.    04/21/2018 Imaging   NUCLEAR MEDICINE PET SKULL BASE TO THIGH IMPRESSION: 1. Marked hypermetabolism in the amorphous soft tissue involving the right hilum. This represents the central component of the apparent radiation scarring. 2. Hypermetabolic mediastinal nodal metastases with nodal metastases identified in the anterior  right juxta diaphragmatic fat. 3. Hypermetabolic right adrenal metastasis. 4. Interval progression of loculated right pleural effusion.    05/02/2018 - 07/25/2018 Chemotherapy   Second line Alimta 500mg /m2 every 3 weeks, started 05/02/2018. D/c due to disease pregression after 4 cycles     07/21/2018 Imaging   CT CAP 07/21/18  IMPRESSION: 1. Enlarging right adrenal metastatic lesion a mildly enlarging mediastinal adenopathy compatible with mild progression of disease. No new metastatic foci are identified. 2. Evolutionary findings along the right perihilar/paramediastinal radiation fibrosis including some improvement in aeration in the right upper lobe, but enlargement of the cavitary/centrally necrotic region in the superior segment right lower lobe, which is gas-filled and which probably connects to the otherwise focally occluded bronchus intermedius. 3. Other imaging findings of potential clinical significance: Probable left anterior descending coronary atherosclerosis. Mild to moderate intrahepatic biliary dilatation mild extrahepatic biliary dilatation, much of which may be a physiologic response to cholecystectomy. Prominent stool throughout the colon favors constipation. Aortic Atherosclerosis (ICD10-I70.0).   07/26/2018 - 08/17/2018 Chemotherapy   Third-line docetaxel and cyramza q3 weeks on 07/26/18. D/c after cycle 2 due to PNA and mild disease progression   09/07/2018 Imaging   CT Angio Chest 09/07/18  IMPRESSION: 1. Detection of pulmonary emboli is severely limited by motion artifact and suboptimal contrast bolus timing. Given this limitation, no PE was identified. The pulmonary arteries are dilated which can be seen in patients with elevated pulmonary artery pressures. 2. Diffuse ground-glass airspace opacities involving the right upper lobe, left upper lobe, and left lower lobe. Findings are concerning for multifocal pneumonia (viral or bacterial). 3. Persistent ill-defined  soft tissue in the right perihilar region with multiple pathologically enlarged mediastinal and hilar lymph nodes. There is a growing air-filled cavitary area within the right perihilar region which may be related to post  treatment changes. 4. Small right-sided pleural effusion. 5. Growing subcutaneous soft tissue nodule in the anterior midline left chest wall as detailed above. This could represent a metastatic deposit. Again noted is a right adrenal metastatic lesion as detailed above.   Aortic Atherosclerosis (ICD10-I70.0).   09/07/2018 - 09/14/2018 Hospital Admission   Admit date: 09/07/18 Admission diagnosis: COPD exacerbation, PNA Additional comments: After 3 cycles of third-line chemo she was admitted for COPD and PNA. She was placed on oxygen and treated with antibotics.    10/05/2018 -  Chemotherapy   Fourth-line Weekly Gemcitabine starting 10/05/18   10/11/2018 Imaging   CT AP W Contrast  IMPRESSION: 1. Enlarging right adrenal gland metastatic lesion. 2. Enlarging subcutaneous soft tissue lesions in the lower anterior chest wall and the anterior abdominal wall. 3. New soft tissue lesion noted in the lower aspect of the right ischial rectal fossa. 4. Stable intra and extrahepatic biliary dilatation. No findings for hepatic metastatic disease. 5. No acute abdominal/pelvic findings or lymphadenopathy. 6. No definite bone lesions are identified. Specifically I do not see a definite cause for the patient's right hip pain. MRI would be suggested for further evaluation.   11/13/2018 Imaging   CT CAP IMPRESSION: 1. Mild mixed interval changes. Mild right paratracheal adenopathy and small soft tissue metastasis in the supraumbilical right abdominal wall have mildly decreased. Right perihilar soft tissue, ventral left chest wall soft tissue metastasis and right adrenal metastasis have slightly increased. New 5 mm apical left upper lobe pulmonary nodule, metastasis not excluded. 2.  Small right pleural effusion, slightly increased. 3.  Aortic Atherosclerosis (ICD10-I70.0).      CURRENT THERAPY:  Fourth-line Gemcitabine 2 weeks on/1 week off starting7/16/20  INTERVAL HISTORY:  Yvonne Lang is here for a follow up and treatment. She .presents to the clinic alone. She notes her mid chest nodule is more red today. She tried not to scratch it. She does nebulizer treatment 1-2 times a day. She denies fever or cough. She notes she struggles to eat some days so her weight fluctuates. She will have to make herself eat and food will stay down.    REVIEW OF SYSTEMS:   Constitutional: Denies fevers, chills (+) taste change, low food intake, weight fluctuates.  Eyes: Denies blurriness of vision Ears, nose, mouth, throat, and face: Denies mucositis or sore throat Respiratory: Denies cough, dyspnea or wheezes Cardiovascular: Denies palpitation, chest discomfort or lower extremity swelling Gastrointestinal:  Denies nausea, heartburn or change in bowel habits Skin: Denies abnormal skin rashes Lymphatics: Denies new lymphadenopathy or easy bruising Neurological:Denies numbness, tingling or new weaknesses Behavioral/Psych: Mood is stable, no new changes  All other systems were reviewed with the patient and are negative.  MEDICAL HISTORY:  Past Medical History:  Diagnosis Date  . Anxiety   . Bronchitis   . COPD (chronic obstructive pulmonary disease) ([place])   . Depression   . GERD (gastroesophageal reflux disease)   . H/O gastric bypass    1999  . Irritable bowel syndrome   . Lung cancer [facility])     SURGICAL HISTORY: Past Surgical History:  Procedure Laterality Date  . BACK SURGERY    . CHOLECYSTECTOMY    . COLON SURGERY     for a kink in her colon and 12 inchs removed  . COLONOSCOPY N/A 12/13/2013   Procedure: COLONOSCOPY;  Surgeon: Rogene Houston, MD;  Location: [facility];  Service: Endoscopy;  Laterality: N/A;  200  . ESOPHAGOGASTRODUODENOSCOPY N/A  12/13/2013   Procedure:  ESOPHAGOGASTRODUODENOSCOPY (EGD);  Surgeon: Rogene Houston, MD;  Location: [facility]O SUITE;  Service: Endoscopy;  Laterality: N/A;  . Gastric Bypas  2000  . IR IMAGING GUIDED PORT INSERTION  11/14/2017    I have reviewed the social history and family history with the patient and they are unchanged from previous note.  ALLERGIES:  is allergic to codeine.  MEDICATIONS:  Current Outpatient Medications  Medication Sig Dispense Refill  . albuterol (PROVENTIL HFA) 108 (90 Base) MCG/ACT inhaler Inhale 2 puffs into the lungs 2 (two) times daily. 0.006 g 1  . ALPRAZolam (XANAX) 0.25 MG tablet Take 1 tablet (0.25 mg total) by mouth at bedtime as needed for anxiety or sleep. 30 tablet 0  . butalbital-acetaminophen-caffeine (FIORICET, ESGIC) 50-325-40 MG tablet Take 1-2 tablets by mouth every 6 (six) hours as needed for headache. 60 tablet 1  . CALCIUM-MAGNESIUM-ZINC PO Take 1 tablet by mouth daily.    Marland Kitchen dexamethasone (DECADRON) 4 MG tablet Take 2 tablets (8 mg total) by mouth 2 (two) times daily. Start the day before Taxotere, then take on day after chemo for 2 days 30 tablet 1  . dicyclomine (BENTYL) 20 MG tablet Take 1 tablet (20 mg total) by mouth every 6 (six) hours. 60 tablet 1  . FLUoxetine (PROZAC) 40 MG capsule Take 80 mg by mouth daily.    . furosemide (LASIX) 20 MG tablet Take 1 tablet (20 mg total) by mouth daily as needed. (Patient taking differently: Take 20 mg by mouth daily as needed for fluid or edema. ) 30 tablet 0  . gabapentin (NEURONTIN) 300 MG capsule Take 300 mg by mouth daily as needed (for pain).   4  . guaiFENesin (MUCINEX) 600 MG 12 hr tablet Take 1 tablet (600 mg total) by mouth 2 (two) times daily. 60 tablet 0  . HYDROcodone-acetaminophen (NORCO) 10-325 MG tablet Take 2 tablets by mouth every 6 (six) hours as needed (For pain.).     Marland Kitchen ipratropium-albuterol (DUONEB) 0.5-2.5 (3) MG/3ML SOLN Take 3 mLs by nebulization every 4 (four) hours as needed. (Patient  taking differently: Take 3 mLs by nebulization every 4 (four) hours as needed (for shortness of breath). ) 360 mL 1  . lidocaine-prilocaine (EMLA) cream APPLY 1 APPLICATION TO PORT TOPICALLY AS NEEDED ONE HOUR PRIOR TO PORT ACCESS/CHEMO (Patient taking differently: Apply 1 application topically as needed (For port-a-cath.). ) 30 g 0  . magic mouthwash w/lidocaine SOLN Take 5 mLs by mouth 3 (three) times daily as needed for mouth pain. 240 mL 0  . Multiple Vitamin (MULTIVITAMIN) tablet Take 1 tablet by mouth daily.    Marland Kitchen nystatin (MYCOSTATIN) 100000 UNIT/ML suspension SWISH ORALLY THEN SPIT WITH 5 ML THREE TIMES DAILY (Patient taking differently: Use as directed 5 mLs in the mouth or throat 3 (three) times daily. ) 240 mL 0  . omeprazole (PRILOSEC) 20 MG capsule Take 1 capsule (20 mg total) by mouth 2 (two) times daily before a meal. 60 capsule 0  . ondansetron (ZOFRAN) 8 MG tablet Take one tablet every 8 hours as needed for nause 30 tablet 2  . oxyCODONE (OXYCONTIN) 20 mg 12 hr tablet Take 1 tablet (20 mg total) by mouth every 12 (twelve) hours. 60 tablet 0  . potassium chloride SA (K-DUR) 20 MEQ tablet Take 1 tablet by mouth once daily 30 tablet 0  . predniSONE (DELTASONE) 10 MG tablet Take 1 tablets daily 30 tablet 1  . prochlorperazine (COMPAZINE) 10 MG tablet TAKE 1 TABLET BY  MOUTH EVERY 6 HOURS AS NEEDED FOR NAUSEA FOR VOMITING 30 tablet 0  . rOPINIRole (REQUIP) 0.5 MG tablet Take 0.5 mg by mouth at bedtime as needed (For restless legs.).   11  . sucralfate (CARAFATE) 1 g tablet TAKE 1 TABLET(1 GRAM) BY MOUTH FOUR TIMES DAILY 120 tablet 0  . TRELEGY ELLIPTA 100-62.5-25 MCG/INH AEPB Inhale 1 puff into the lungs daily.   5  . zolpidem (AMBIEN) 10 MG tablet Take 10 mg by mouth at bedtime.      No current facility-administered medications for this visit.     PHYSICAL EXAMINATION: ECOG PERFORMANCE STATUS: 2 - Symptomatic, <50% confined to bed  Vitals:   12/14/18 1436  BP: 118/86  Pulse: (!)  107  Resp: 18  Temp: 98.3 F (36.8 C)  SpO2: 97%   Filed Weights   12/14/18 1436  Weight: 175 lb 14.4 oz (79.8 kg)    GENERAL:alert, no distress and comfortable SKIN: skin color, texture, turgor are normal, no rashes (+) subcutaneous 2.7x3cm mid left lower chest nodule (previously 3x3cm) with tenderness (+) 1cm Subcutaneous nodule at right suprapubic area, tender EYES: normal, Conjunctiva are pink and non-injected, sclera clear  NECK: supple, thyroid normal size, non-tender, without nodularity LYMPH:  no palpable lymphadenopathy in the cervical, axillary  LUNGS: clear to auscultation and percussion with normal breathing effort (+) bilateral diffuse wheezing  HEART: regular rate & rhythm and no murmurs and no lower extremity edema ABDOMEN:abdomen soft, non-tender and normal bowel sounds Musculoskeletal:no cyanosis of digits and no clubbing  NEURO: alert & oriented x 3 with fluent speech, no focal motor/sensory deficits  LABORATORY DATA:  I have reviewed the data as listed CBC Latest Ref Rng & Units 12/14/2018 12/07/2018 11/23/2018  WBC 4.0 - 10.5 K/uL 5.8 10.0 5.1  Hemoglobin 12.0 - 15.0 g/dL 9.9(L) 9.7(L) 10.5(L)  Hematocrit 36.0 - 46.0 % 31.5(L) 31.4(L) 33.9(L)  Platelets 150 - 400 K/uL 336 553(H) 498(H)     CMP Latest Ref Rng & Units 12/14/2018 12/07/2018 11/23/2018  Glucose 70 - 99 mg/dL 109(H) 99 107(H)  BUN 6 - 20 mg/dL 4(L) 4(L) <4(L)  Creatinine 0.44 - 1.00 mg/dL 0.66 0.59 0.63  Sodium 135 - 145 mmol/L 139 142 133(L)  Potassium 3.5 - 5.1 mmol/L 5.0 3.6 4.4  Chloride 98 - 111 mmol/L 104 104 101  CO2 22 - 32 mmol/L 29 30 27   Calcium 8.9 - 10.3 mg/dL 8.0(L) 7.8(L) 8.1(L)  Total Protein 6.5 - 8.1 g/dL 5.5(L) 5.1(L) 6.1(L)  Total Bilirubin 0.3 - 1.2 mg/dL 0.3 <0.2(L) 0.3  Alkaline Phos 38 - 126 U/L 357(H) 281(H) 334(H)  AST 15 - 41 U/L 113(H) 30 67(H)  ALT 0 - 44 U/L 49(H) 24 49(H)      RADIOGRAPHIC STUDIES: I have personally reviewed the radiological images as listed and  agreed with the findings in the report. No results found.   ASSESSMENT & PLAN:  Azarie Coriz is a 53 y.o. female with   1. 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 -Diagnosed in 09/2017. Treated with chemo and radiation.  -she has progressed through multiple line chemo, including immunotherapy  -I started her on fourth line Gemcitabine 2 weeks on/1 week off on 10/05/18.She tolerating moderately well with mild nausea, manageable. -Based on her 11/13/18 CT CAP, althoughher subcutaneous left lower chest wall nodule has increased from September 07, 2018, it has been stable since that she started gemcitabine in mid July.Other lesions are overall stable. She is tolerating gemcitabine  well, I recommend tocontinuefor disease control.She agrees.  -She has had recent mild increase in her subcutaneous mid chest nodule, now 3cm. She also has 1cm right suprapubic subcutaneous nodule. Will continue to monitor.  -She is mostly clinically stable. Her mid chest nodule overall stable. Labs reviewed, CBC and CMP WNL except hg 9.9, ALT 49, AST 119. Ferritin still pending. Overall adequate to proceed with C4D8 Gemcitabine.  -Plan to scan her in 3-4 weeks.  -f/u in 2 weeks  -She opted to receive flu shot today    2.Dyspnea and dry cough secondary to her COPD,right lung cancer and radiation changes  -Overall stable, she will continuebronchodilator -In 08/2017 she had recent COPD exacerbation andPneumoniainfection. She was treated inpatient with oxygen, antibiotics and steroids. -She was discharged on 3L oxygen [place] and 40mg  prednisone daily and breathing treatment TID.  -Starting 09/22/18, I will have her reduce prednisone to 30mg  and continue to very slowly ween her off.  -She still has Dyspnea upon mild activities. Dry cough persists. -Her breathing is stable on5mg  prednisone and she was able to reduce her oxygen from 2L.She has struggled to wean off prednisone,  will continue 5mg  dose. -Breathing has been stable on supplemental oxygen and 5mg  prednisone.  -for her recent left sided chest pain from mild progression she will continue to Hydrocodone she is on. -Her wheezing has worsened this week. I encouraged her to increase nebulizer breathing treatment at least twice a day (12/04/18).  -To help her breathing and her appetite will increase her prednisone to 10mg  daily (12/14/18)  3. Frontal Headaches -f/u with Dr. Mickeal Skinner -overall stableand improved  4. Anxiety and Depression  -Currently on Xanax and Cymbalta.Stable  5. Chronic back pain -previouslyon Tramadol. Better controlled on Hydrocodone 2 tabs q6hours.  -shepreviouslytried long acting OxyContin but this did not improve her pain. She will continue with Hydrocodone which is managed by her PCP.  6. Low appetite, Nausea -Currently on Mirtazapine, omeprazole, ondansetron,prochlorperazine, and sucralfate. -Nutritional supplement -Due to occasional taste change, some days she will have poor food intake. Her weight has been fluctuating.  -I encouraged her to start high calorie, high protein diet. She can take supplemental protein or clear ensure on days she has lower appetite.  -Will increase prednisone to 10mg  to help with diet and her breathing (12/14/18). If not enough I will call in appetite stimulant Megace.  -I will also set up dietician consult for better diet management.   7. Hypokalemia  -OnKCL 46meq daily -Potassium normal today  8.Transaminitis -Probably related to chemotherapy, will continue monitoring. -slightly worse today, will monitor   9.Goal of care discussion, DNR/DNI -The patient understands the goal of care is palliative. -Wepreviouslydiscussed overall poor prognosis due to the disease progression,she has had multiple lines ofchemo, the response from future treatment will be low -We discussed DNR/DNIwhen she was recently in hospital, and she  agreed DNR/DNI  Plan -Flu shot today  -Set up dietitian consult  -I refilled and increased prednisone to 10mg  daily today for her low appetite and wheezing  -Labs reviewed and adequate to proceed with C4D8 Gemcitabine today  -Lab, flush, f/u and Gemcitabinein 2 weeks   No problem-specific Assessment & Plan notes found for this encounter.   No orders of the defined types were placed in this encounter.  All questions were answered. The patient knows to call the clinic with any problems, questions or concerns. No barriers to learning was detected. I spent 20 minutes counseling the patient face to face. The total  time spent in the appointment was 25 minutes and more than 50% was on counseling and review of test results     Truitt Merle, MD 12/14/2018   I, Yvonne Lang, am acting as scribe for Truitt Merle, MD.   I have reviewed the above documentation for accuracy and completeness, and I agree with the above.

## 2018-12-14 ENCOUNTER — Inpatient Hospital Stay: Payer: BC Managed Care – PPO

## 2018-12-14 ENCOUNTER — Encounter: Payer: BC Managed Care – PPO | Admitting: Nutrition

## 2018-12-14 ENCOUNTER — Encounter: Payer: Self-pay | Admitting: Hematology

## 2018-12-14 ENCOUNTER — Other Ambulatory Visit: Payer: Self-pay

## 2018-12-14 ENCOUNTER — Other Ambulatory Visit: Payer: Self-pay | Admitting: Hematology

## 2018-12-14 ENCOUNTER — Inpatient Hospital Stay (HOSPITAL_BASED_OUTPATIENT_CLINIC_OR_DEPARTMENT_OTHER): Payer: BC Managed Care – PPO | Admitting: Hematology

## 2018-12-14 VITALS — HR 98

## 2018-12-14 VITALS — BP 118/86 | HR 107 | Temp 98.3°F | Resp 18 | Ht 67.0 in | Wt 175.9 lb

## 2018-12-14 DIAGNOSIS — C7971 Secondary malignant neoplasm of right adrenal gland: Secondary | ICD-10-CM | POA: Diagnosis not present

## 2018-12-14 DIAGNOSIS — C7989 Secondary malignant neoplasm of other specified sites: Secondary | ICD-10-CM | POA: Diagnosis not present

## 2018-12-14 DIAGNOSIS — R110 Nausea: Secondary | ICD-10-CM | POA: Diagnosis not present

## 2018-12-14 DIAGNOSIS — R05 Cough: Secondary | ICD-10-CM | POA: Diagnosis not present

## 2018-12-14 DIAGNOSIS — R0600 Dyspnea, unspecified: Secondary | ICD-10-CM | POA: Diagnosis not present

## 2018-12-14 DIAGNOSIS — C3490 Malignant neoplasm of unspecified part of unspecified bronchus or lung: Secondary | ICD-10-CM

## 2018-12-14 DIAGNOSIS — G8929 Other chronic pain: Secondary | ICD-10-CM | POA: Diagnosis not present

## 2018-12-14 DIAGNOSIS — R740 Nonspecific elevation of levels of transaminase and lactic acid dehydrogenase [LDH]: Secondary | ICD-10-CM | POA: Diagnosis not present

## 2018-12-14 DIAGNOSIS — Z23 Encounter for immunization: Secondary | ICD-10-CM

## 2018-12-14 DIAGNOSIS — E876 Hypokalemia: Secondary | ICD-10-CM | POA: Diagnosis not present

## 2018-12-14 DIAGNOSIS — R222 Localized swelling, mass and lump, trunk: Secondary | ICD-10-CM | POA: Diagnosis not present

## 2018-12-14 DIAGNOSIS — C3431 Malignant neoplasm of lower lobe, right bronchus or lung: Secondary | ICD-10-CM | POA: Diagnosis not present

## 2018-12-14 DIAGNOSIS — M549 Dorsalgia, unspecified: Secondary | ICD-10-CM | POA: Diagnosis not present

## 2018-12-14 DIAGNOSIS — D630 Anemia in neoplastic disease: Secondary | ICD-10-CM

## 2018-12-14 DIAGNOSIS — Z95828 Presence of other vascular implants and grafts: Secondary | ICD-10-CM

## 2018-12-14 DIAGNOSIS — I700 Atherosclerosis of aorta: Secondary | ICD-10-CM | POA: Diagnosis not present

## 2018-12-14 DIAGNOSIS — R51 Headache: Secondary | ICD-10-CM | POA: Diagnosis not present

## 2018-12-14 DIAGNOSIS — J9601 Acute respiratory failure with hypoxia: Secondary | ICD-10-CM

## 2018-12-14 DIAGNOSIS — C787 Secondary malignant neoplasm of liver and intrahepatic bile duct: Secondary | ICD-10-CM | POA: Diagnosis not present

## 2018-12-14 DIAGNOSIS — Z5111 Encounter for antineoplastic chemotherapy: Secondary | ICD-10-CM | POA: Diagnosis not present

## 2018-12-14 LAB — CBC WITH DIFFERENTIAL (CANCER CENTER ONLY)
Abs Immature Granulocytes: 0.09 10*3/uL — ABNORMAL HIGH (ref 0.00–0.07)
Basophils Absolute: 0 10*3/uL (ref 0.0–0.1)
Basophils Relative: 1 %
Eosinophils Absolute: 0 10*3/uL (ref 0.0–0.5)
Eosinophils Relative: 0 %
HCT: 31.5 % — ABNORMAL LOW (ref 36.0–46.0)
Hemoglobin: 9.9 g/dL — ABNORMAL LOW (ref 12.0–15.0)
Immature Granulocytes: 2 %
Lymphocytes Relative: 19 %
Lymphs Abs: 1.1 10*3/uL (ref 0.7–4.0)
MCH: 32.1 pg (ref 26.0–34.0)
MCHC: 31.4 g/dL (ref 30.0–36.0)
MCV: 102.3 fL — ABNORMAL HIGH (ref 80.0–100.0)
Monocytes Absolute: 0.7 10*3/uL (ref 0.1–1.0)
Monocytes Relative: 12 %
Neutro Abs: 3.9 10*3/uL (ref 1.7–7.7)
Neutrophils Relative %: 66 %
Platelet Count: 336 10*3/uL (ref 150–400)
RBC: 3.08 MIL/uL — ABNORMAL LOW (ref 3.87–5.11)
RDW: 18 % — ABNORMAL HIGH (ref 11.5–15.5)
WBC Count: 5.8 10*3/uL (ref 4.0–10.5)
nRBC: 0.7 % — ABNORMAL HIGH (ref 0.0–0.2)

## 2018-12-14 LAB — CMP (CANCER CENTER ONLY)
ALT: 49 U/L — ABNORMAL HIGH (ref 0–44)
AST: 113 U/L — ABNORMAL HIGH (ref 15–41)
Albumin: 2.5 g/dL — ABNORMAL LOW (ref 3.5–5.0)
Alkaline Phosphatase: 357 U/L — ABNORMAL HIGH (ref 38–126)
Anion gap: 6 (ref 5–15)
BUN: 4 mg/dL — ABNORMAL LOW (ref 6–20)
CO2: 29 mmol/L (ref 22–32)
Calcium: 8 mg/dL — ABNORMAL LOW (ref 8.9–10.3)
Chloride: 104 mmol/L (ref 98–111)
Creatinine: 0.66 mg/dL (ref 0.44–1.00)
GFR, Est AFR Am: >60 mL/min (ref >60)
GFR, Estimated: >60 mL/min (ref >60)
Glucose, Bld: 109 mg/dL — ABNORMAL HIGH (ref 70–99)
Potassium: 5 mmol/L (ref 3.5–5.1)
Sodium: 139 mmol/L (ref 135–145)
Total Bilirubin: 0.3 mg/dL (ref 0.3–1.2)
Total Protein: 5.5 g/dL — ABNORMAL LOW (ref 6.5–8.1)

## 2018-12-14 LAB — FERRITIN: Ferritin: 279 ng/mL (ref 11–307)

## 2018-12-14 MED ORDER — PROCHLORPERAZINE MALEATE 10 MG PO TABS
10.0000 mg | ORAL_TABLET | Freq: Once | ORAL | Status: AC
  Administered 2018-12-14: 10 mg via ORAL

## 2018-12-14 MED ORDER — PROCHLORPERAZINE MALEATE 10 MG PO TABS
ORAL_TABLET | ORAL | Status: AC
  Filled 2018-12-14: qty 1

## 2018-12-14 MED ORDER — INFLUENZA VAC SPLIT QUAD 0.5 ML IM SUSY
PREFILLED_SYRINGE | INTRAMUSCULAR | Status: AC
  Filled 2018-12-14: qty 0.5

## 2018-12-14 MED ORDER — SODIUM CHLORIDE 0.9% FLUSH
10.0000 mL | INTRAVENOUS | Status: DC | PRN
  Administered 2018-12-14: 10 mL
  Filled 2018-12-14: qty 10

## 2018-12-14 MED ORDER — SODIUM CHLORIDE 0.9% FLUSH
10.0000 mL | INTRAVENOUS | Status: DC | PRN
  Administered 2018-12-14: 16:00:00 10 mL
  Filled 2018-12-14: qty 10

## 2018-12-14 MED ORDER — SODIUM CHLORIDE 0.9 % IV SOLN
Freq: Once | INTRAVENOUS | Status: AC
  Administered 2018-12-14: 15:00:00 via INTRAVENOUS
  Filled 2018-12-14: qty 250

## 2018-12-14 MED ORDER — PREDNISONE 10 MG PO TABS: ORAL_TABLET | ORAL | 1 refills | Status: DC

## 2018-12-14 MED ORDER — INFLUENZA VAC SPLIT QUAD 0.5 ML IM SUSY
0.5000 mL | PREFILLED_SYRINGE | Freq: Once | INTRAMUSCULAR | Status: AC
  Administered 2018-12-14: 0.5 mL via INTRAMUSCULAR

## 2018-12-14 MED ORDER — SODIUM CHLORIDE 0.9 % IV SOLN
2000.0000 mg | Freq: Once | INTRAVENOUS | Status: AC
  Administered 2018-12-14: 16:00:00 2000 mg via INTRAVENOUS
  Filled 2018-12-14: qty 52.6

## 2018-12-14 MED ORDER — HEPARIN SOD (PORK) LOCK FLUSH 100 UNIT/ML IV SOLN
500.0000 [IU] | Freq: Once | INTRAVENOUS | Status: AC | PRN
  Administered 2018-12-14: 500 [IU]
  Filled 2018-12-14: qty 5

## 2018-12-14 NOTE — Progress Notes (Signed)
Ok to treat with lab results today per MD Burr Medico

## 2018-12-14 NOTE — Patient Instructions (Signed)
Coronavirus (COVID-19) Are you at risk?  Are you at risk for the Coronavirus (COVID-19)?  To be considered HIGH RISK for Coronavirus (COVID-19), you have to meet the following criteria:  . Traveled to [place], [place], [place], [place] or [place]; or in the [place] to [place], [place], [place], or [place]; and have fever, cough, and shortness of breath within the last 2 weeks of travel OR . Been in close contact with a person diagnosed with COVID-19 within the last 2 weeks and have fever, cough, and shortness of breath . IF YOU DO NOT MEET THESE CRITERIA, YOU ARE CONSIDERED LOW RISK FOR COVID-19.  What to do if you are HIGH RISK for COVID-19?  Marland Kitchen If you are having a medical emergency, call 911. . Seek medical care right away. Before you go to a doctor's office, urgent care or emergency department, call ahead and tell them about your recent travel, contact with someone diagnosed with COVID-19, and your symptoms. You should receive instructions from your physician's office regarding next steps of care.  . When you arrive at healthcare provider, tell the healthcare staff immediately you have returned from visiting [place], [place], [place], [place] or [place]; or traveled in the [place] to [place], [place], [place], or [place]; in the last two weeks or you have been in close contact with a person diagnosed with COVID-19 in the last 2 weeks.   . Tell the health care staff about your symptoms: fever, cough and shortness of breath. . After you have been seen by a medical provider, you will be either: o Tested for (COVID-19) and discharged home on quarantine except to seek medical care if symptoms worsen, and asked to  - Stay home and avoid contact with others until you get your results (4-5 days)  - Avoid travel on public transportation if possible (such as bus, train, or airplane) or o Sent to the Emergency Department by EMS for evaluation, COVID-19 testing, and possible  admission depending on your condition and test results.  What to do if you are LOW RISK for COVID-19?  Reduce your risk of any infection by using the same precautions used for avoiding the common cold or flu:  Marland Kitchen Wash your hands often with soap and warm water for at least 20 seconds.  If soap and water are not readily available, use an alcohol-based hand sanitizer with at least 60% alcohol.  . If coughing or sneezing, cover your mouth and nose by coughing or sneezing into the elbow areas of your shirt or coat, into a tissue or into your sleeve (not your hands). . Avoid shaking hands with others and consider head nods or verbal greetings only. . Avoid touching your eyes, nose, or mouth with unwashed hands.  . Avoid close contact with people who are sick. . Avoid places or events with large numbers of people in one location, like concerts or sporting events. . Carefully consider travel plans you have or are making. . If you are planning any travel outside or inside the [place], visit the CDC's Travelers' Health webpage for the latest health notices. . If you have some symptoms but not all symptoms, continue to monitor at home and seek medical attention if your symptoms worsen. . If you are having a medical emergency, call 911.   [place] / e-Visit: [email or web address]         [facility]: [place]  [facility]: [facility]: [place] Discharge Instructions for Patients Receiving Chemotherapy  Today you received the following chemotherapy agents Gemzar  To help prevent nausea and vomiting after your treatment, we encourage you to take your nausea medication as directed.   If you develop nausea and vomiting that is not controlled by your nausea medication, call the clinic.   BELOW ARE  SYMPTOMS THAT SHOULD BE REPORTED IMMEDIATELY:  *FEVER GREATER THAN 100.5 F  *CHILLS WITH OR WITHOUT FEVER  NAUSEA AND VOMITING THAT IS NOT CONTROLLED WITH YOUR NAUSEA MEDICATION  *UNUSUAL SHORTNESS OF BREATH  *UNUSUAL BRUISING OR BLEEDING  TENDERNESS IN MOUTH AND THROAT WITH OR WITHOUT PRESENCE OF ULCERS  *URINARY PROBLEMS  *BOWEL PROBLEMS  UNUSUAL RASH Items with * indicate a potential emergency and should be followed up as soon as possible.  Feel free to call the clinic should you have any questions or concerns. The clinic phone number is [phone].  Please show the [facility] at check-in to the Emergency Department and triage nurse.

## 2018-12-15 ENCOUNTER — Telehealth: Payer: Self-pay | Admitting: Hematology

## 2018-12-15 DIAGNOSIS — C3490 Malignant neoplasm of unspecified part of unspecified bronchus or lung: Secondary | ICD-10-CM | POA: Diagnosis not present

## 2018-12-15 NOTE — Telephone Encounter (Signed)
No los per 9/24. °

## 2018-12-17 ENCOUNTER — Other Ambulatory Visit: Payer: Self-pay | Admitting: Hematology

## 2018-12-20 DIAGNOSIS — M159 Polyosteoarthritis, unspecified: Secondary | ICD-10-CM | POA: Diagnosis not present

## 2018-12-20 DIAGNOSIS — C3490 Malignant neoplasm of unspecified part of unspecified bronchus or lung: Secondary | ICD-10-CM | POA: Diagnosis not present

## 2018-12-20 DIAGNOSIS — G894 Chronic pain syndrome: Secondary | ICD-10-CM | POA: Diagnosis not present

## 2018-12-20 DIAGNOSIS — F419 Anxiety disorder, unspecified: Secondary | ICD-10-CM | POA: Diagnosis not present

## 2018-12-21 ENCOUNTER — Ambulatory Visit: Payer: BC Managed Care – PPO

## 2018-12-21 ENCOUNTER — Other Ambulatory Visit: Payer: BC Managed Care – PPO

## 2018-12-22 ENCOUNTER — Encounter (INDEPENDENT_AMBULATORY_CARE_PROVIDER_SITE_OTHER): Payer: Self-pay | Admitting: *Deleted

## 2018-12-23 DIAGNOSIS — C3490 Malignant neoplasm of unspecified part of unspecified bronchus or lung: Secondary | ICD-10-CM | POA: Diagnosis not present

## 2018-12-25 ENCOUNTER — Encounter: Payer: Self-pay | Admitting: Hematology

## 2018-12-25 NOTE — Progress Notes (Signed)
[place]   [phone] [phone]   Clinic Follow up Note   Patient Care Team: Redmond School, MD as PCP - General (Internal Medicine)  Date of Service:  12/28/2018  CHIEF COMPLAINT: Metastatic non-small cell lung cancer  SUMMARY OF ONCOLOGIC HISTORY: Oncology History Overview Note  Cancer Staging Metastatic non-small cell lung cancer [facility]) Staging form: Lung, AJCC 8th Edition - Clinical stage from 10/17/2017: Stage IV (cT3, cN1, cM1b) - Signed by Truitt Merle, MD on 10/17/2017     Metastatic non-small cell lung cancer ([place])  09/20/2017 Imaging   US Breast Left 09/20/17  IMPRESSION Indeterminte palpable mass measuring 3.2x1.7x2.8 cm  inferior medial to the inframammary fold of the left breast.     09/20/2017 Initial Biopsy   Diagnosis 09/20/17 Soft Tissue Needle Core Biopsy, inferior medial to IMF - ADENOCARCINOMA. - SEE MICROSCOPIC DESCRIPTION.  Microscopic Comment Immunohistochemistry will be performed and reported as an addendum. (JDP:ah 09/23/17) ADDENDUM: Immunohistochemistry shows the tumor is strongly positive with cytokeratin AE1/AE3, cytokeratin 7 and shows moderate weak positivity with CDX-2. The tumor is negative with estrogen receptor, progesterone receptor, GCDFP, GATA-3, Napsin A, TTF-1, WT-1 and cytokeratin 20. The immunophenotype is consistent with metastatic carcinoma. The combination of CDX-2 and cytokeratin 7 positivity raises the possibility of an upper gastrointestinal primary. Clinical correlation is essential.   10/06/2017 Initial Diagnosis   Metastatic adenocarcinoma involving soft tissue with unknown primary site [facility])   10/10/2017 Procedure   Upper Endoscopy by Dr. Silverio Decamp 10/10/17  IMPRESSION - Normal esophagus. - Z-line regular, 35 cm from the incisors. - Gastric bypass with a normal-sized pouch and intact staple line. Gastrojejunal anastomosis characterized by healthy appearing mucosa. - No specimens collected.  10/12/2017 Imaging   CT CAP WO Contrast 10/12/17  IMPRESSION: 7 cm central right lung mass involving the right hilum, with postobstructive collapse of the right middle lobe, highly suspicious for primary bronchogenic carcinoma. 5 cm masslike opacity in the superior segment of the right lower lobe may represent carcinoma or postobstructive pneumonitis. 3.3 cm soft tissue mass in the lower anterior chest wall soft tissues, suspicious for metastatic disease. No evidence of abdominal or pelvic metastatic disease.    10/14/2017 Imaging   MRI Brain 10/14/17  IMPRESSION: 1. No metastatic disease or acute intracranial abnormality. Normal MRI appearance of the brain. 2. 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     10/17/2017 Cancer Staging   Staging form: Lung, AJCC 8th Edition - Clinical stage from 10/17/2017: Stage IV (cT3, cN1, cM1b) - Signed by Truitt Merle, MD on 10/17/2017   10/24/2017 - 11/04/2017 Radiation Therapy    The Right lung mass was treated to 30 Gy in 10 fractions of 3 Gy   11/09/2017 - 03/24/2018 Chemotherapy   -first line chemo with carboplatin, paclitaxel, Atezolizumab and bevacizumab every 3weeks starting 11/08/17.  -Switched to maintenance therapy with avastin and atezolizumab q3weeks on 03/24/18   01/10/2018 Imaging   01/10/2018 CT CA IMPRESSION: 1. Response to therapy of central right lower lobe lung mass. Re-expansion of the right middle lobe with significantly improved right lower lobe postobstructive pneumonitis. Residual geographic airspace and ground-glass opacity within the medial right lung, primarily favored to be radiation induced. 2. Near complete resolution of presternal soft tissue mass. 3. New right adrenal nodule since 10/11/2017, suspicious for metastatic disease. 4. Right-sided Port-A-Cath with probable nonocclusive thrombus along the catheter within the right brachiocephalic vein. 5. Age advanced coronary artery atherosclerosis. Recommend  assessment of coronary risk factors and consideration  of medical therapy. 6. Aortic atherosclerosis (ICD10-I70.0) and emphysema (ICD10-J43.9).   03/10/2018 Imaging   CT CAP W Contrast 03/10/18 IMPRESSION: 1. Radiation changes involving the right paramediastinal lung and right hilum but no findings suspicious for residual or recurrent tumor. 2. No evidence of pulmonary metastatic disease. Stable emphysematous changes and areas of pulmonary scarring. 3. Stable 11 mm right adrenal gland nodule. 4. No findings for metastatic disease involving the liver or bony structures.   03/24/2018 - 04/24/2018 Chemotherapy   The patient had bevacizumab (AVASTIN) 1,300 mg in sodium chloride 0.9 % 100 mL chemo infusion, 15.5 mg/kg = 1,275 mg, Intravenous,  Once, 1 of 4 cycles Administration: 1,300 mg (03/24/2018) atezolizumab (TECENTRIQ) 1,200 mg in sodium chloride 0.9 % 250 mL chemo infusion, 1,200 mg, Intravenous, Once, 1 of 4 cycles Administration: 1,200 mg (03/24/2018)  for chemotherapy treatment.    04/11/2018 Imaging   CT ANGIO CHEST PE W OR WO CONTRAST  IMPRESSION: 1. No evidence of acute pulmonary embolism. 2. Progressive radiation changes in the right perihilar region. Cavitary retro hilar mass and right paratracheal lymph node have enlarged, suspicious for local recurrence/disease progression. 3. Enlarging right adrenal nodule suspicious for metastatic disease. 4. New right pleural effusion with increased atelectasis at both lung bases. 5. These results will be called to the ordering clinician or representative by the Radiology Department at the imaging location.    04/21/2018 Imaging   NUCLEAR MEDICINE PET SKULL BASE TO THIGH IMPRESSION: 1. Marked hypermetabolism in the amorphous soft tissue involving the right hilum. This represents the central component of the apparent radiation scarring. 2. Hypermetabolic mediastinal nodal metastases with nodal metastases identified in the anterior  right juxta diaphragmatic fat. 3. Hypermetabolic right adrenal metastasis. 4. Interval progression of loculated right pleural effusion.    05/02/2018 - 07/25/2018 Chemotherapy   Second line Alimta 541m/m2 every 3 weeks, started 05/02/2018. D/c due to disease pregression after 4 cycles     07/21/2018 Imaging   CT CAP 07/21/18  IMPRESSION: 1. Enlarging right adrenal metastatic lesion a mildly enlarging mediastinal adenopathy compatible with mild progression of disease. No new metastatic foci are identified. 2. Evolutionary findings along the right perihilar/paramediastinal radiation fibrosis including some improvement in aeration in the right upper lobe, but enlargement of the cavitary/centrally necrotic region in the superior segment right lower lobe, which is gas-filled and which probably connects to the otherwise focally occluded bronchus intermedius. 3. Other imaging findings of potential clinical significance: Probable left anterior descending coronary atherosclerosis. Mild to moderate intrahepatic biliary dilatation mild extrahepatic biliary dilatation, much of which may be a physiologic response to cholecystectomy. Prominent stool throughout the colon favors constipation. Aortic Atherosclerosis (ICD10-I70.0).   07/26/2018 - 08/17/2018 Chemotherapy   Third-line docetaxel and cyramza q3 weeks on 07/26/18. D/c after cycle 2 due to PNA and mild disease progression   09/07/2018 Imaging   CT Angio Chest 09/07/18  IMPRESSION: 1. Detection of pulmonary emboli is severely limited by motion artifact and suboptimal contrast bolus timing. Given this limitation, no PE was identified. The pulmonary arteries are dilated which can be seen in patients with elevated pulmonary artery pressures. 2. Diffuse ground-glass airspace opacities involving the right upper lobe, left upper lobe, and left lower lobe. Findings are concerning for multifocal pneumonia (viral or bacterial). 3. Persistent ill-defined  soft tissue in the right perihilar region with multiple pathologically enlarged mediastinal and hilar lymph nodes. There is a growing air-filled cavitary area within the right perihilar region which may be related to post treatment changes.  4. Small right-sided pleural effusion. 5. Growing subcutaneous soft tissue nodule in the anterior midline left chest wall as detailed above. This could represent a metastatic deposit. Again noted is a right adrenal metastatic lesion as detailed above.   Aortic Atherosclerosis (ICD10-I70.0).   09/07/2018 - 09/14/2018 Hospital Admission   Admit date: 09/07/18 Admission diagnosis: COPD exacerbation, PNA Additional comments: After 3 cycles of third-line chemo she was admitted for COPD and PNA. She was placed on oxygen and treated with antibotics.    10/05/2018 -  Chemotherapy   Fourth-line Weekly Gemcitabine starting 10/05/18   10/11/2018 Imaging   CT AP W Contrast  IMPRESSION: 1. Enlarging right adrenal gland metastatic lesion. 2. Enlarging subcutaneous soft tissue lesions in the lower anterior chest wall and the anterior abdominal wall. 3. New soft tissue lesion noted in the lower aspect of the right ischial rectal fossa. 4. Stable intra and extrahepatic biliary dilatation. No findings for hepatic metastatic disease. 5. No acute abdominal/pelvic findings or lymphadenopathy. 6. No definite bone lesions are identified. Specifically I do not see a definite cause for the patient's right hip pain. MRI would be suggested for further evaluation.   11/13/2018 Imaging   CT CAP IMPRESSION: 1. Mild mixed interval changes. Mild right paratracheal adenopathy and small soft tissue metastasis in the supraumbilical right abdominal wall have mildly decreased. Right perihilar soft tissue, ventral left chest wall soft tissue metastasis and right adrenal metastasis have slightly increased. New 5 mm apical left upper lobe pulmonary nodule, metastasis not excluded. 2.  Small right pleural effusion, slightly increased. 3.  Aortic Atherosclerosis (ICD10-I70.0).      CURRENT THERAPY:  Fourth-line Gemcitabine2 weeks on/1 week offstarting7/16/20  INTERVAL HISTORY:  Yvonne Lang is here for a follow up and treatment. She presents to the clinic alone. She notes she is had to put her dog to sleep recently. She denies any other changes. She notes she has not been called to schedule her CT scan. She feels her mid chest nodule is stable and that groin nodule is getting bigger.  She is on 71m Prednisone a day. She notes she has been eating more and gaining weight. She denies phlegm with cough.     REVIEW OF SYSTEMS:   Constitutional: Denies fevers, chills or abnormal weight loss Eyes: Denies blurriness of vision Ears, nose, mouth, throat, and face: Denies mucositis or sore throat Respiratory: Denies cough, dyspnea or wheezes Cardiovascular: Denies palpitation, chest discomfort or lower extremity swelling Gastrointestinal:  Denies nausea, heartburn or change in bowel habits Skin: Denies abnormal skin rashes Lymphatics: Denies new lymphadenopathy or easy bruising Neurological:Denies numbness, tingling or new weaknesses Behavioral/Psych: Mood is stable, no new changes  All other systems were reviewed with the patient and are negative.  MEDICAL HISTORY:  Past Medical History:  Diagnosis Date  . Anxiety   . Bronchitis   . COPD (chronic obstructive pulmonary disease) ([place]   . Depression   . GERD (gastroesophageal reflux disease)   . H/O gastric bypass    1999  . Irritable bowel syndrome   . Lung cancer ([facility]     SURGICAL HISTORY: Past Surgical History:  Procedure Laterality Date  . BACK SURGERY    . CHOLECYSTECTOMY    . COLON SURGERY     for a kink in her colon and 12 inchs removed  . COLONOSCOPY N/A 12/13/2013   Procedure: COLONOSCOPY;  Surgeon: NRogene Houston MD;  Location: [facility];  Service: Endoscopy;  Laterality: N/A;  200  .  ESOPHAGOGASTRODUODENOSCOPY N/A 12/13/2013   Procedure: ESOPHAGOGASTRODUODENOSCOPY (EGD);  Surgeon: Rogene Houston, MD;  Location: [facility];  Service: Endoscopy;  Laterality: N/A;  . Gastric Bypas  2000  . IR IMAGING GUIDED PORT INSERTION  11/14/2017    I have reviewed the social history and family history with the patient and they are unchanged from previous note.  ALLERGIES:  is allergic to codeine.  MEDICATIONS:  Current Outpatient Medications  Medication Sig Dispense Refill  . albuterol (PROVENTIL HFA) 108 (90 Base) MCG/ACT inhaler Inhale 2 puffs into the lungs 2 (two) times daily. 0.006 g 1  . ALPRAZolam (XANAX) 0.25 MG tablet Take 1 tablet (0.25 mg total) by mouth at bedtime as needed for anxiety or sleep. 30 tablet 0  . butalbital-acetaminophen-caffeine (FIORICET, ESGIC) 50-325-40 MG tablet Take 1-2 tablets by mouth every 6 (six) hours as needed for headache. 60 tablet 1  . CALCIUM-MAGNESIUM-ZINC PO Take 1 tablet by mouth daily.    Marland Kitchen dexamethasone (DECADRON) 4 MG tablet Take 2 tablets (8 mg total) by mouth 2 (two) times daily. Start the day before Taxotere, then take on day after chemo for 2 days 30 tablet 1  . dicyclomine (BENTYL) 20 MG tablet Take 1 tablet (20 mg total) by mouth every 6 (six) hours. 60 tablet 1  . FLUoxetine (PROZAC) 40 MG capsule Take 80 mg by mouth daily.    . furosemide (LASIX) 20 MG tablet Take 1 tablet (20 mg total) by mouth daily as needed. (Patient taking differently: Take 20 mg by mouth daily as needed for fluid or edema. ) 30 tablet 0  . gabapentin (NEURONTIN) 300 MG capsule Take 300 mg by mouth daily as needed (for pain).   4  . guaiFENesin (MUCINEX) 600 MG 12 hr tablet Take 1 tablet (600 mg total) by mouth 2 (two) times daily. 60 tablet 0  . HYDROcodone-acetaminophen (NORCO) 10-325 MG tablet Take 2 tablets by mouth every 6 (six) hours as needed (For pain.).     Marland Kitchen ipratropium-albuterol (DUONEB) 0.5-2.5 (3) MG/3ML SOLN Take 3 mLs by nebulization every 4  (four) hours as needed. (Patient taking differently: Take 3 mLs by nebulization every 4 (four) hours as needed (for shortness of breath). ) 360 mL 1  . lidocaine-prilocaine (EMLA) cream APPLY 1 APPLICATION TO PORT TOPICALLY AS NEEDED ONE HOUR PRIOR TO PORT ACCESS/CHEMO (Patient taking differently: Apply 1 application topically as needed (For port-a-cath.). ) 30 g 0  . magic mouthwash w/lidocaine SOLN Take 5 mLs by mouth 3 (three) times daily as needed for mouth pain. 240 mL 0  . Multiple Vitamin (MULTIVITAMIN) tablet Take 1 tablet by mouth daily.    Marland Kitchen nystatin (MYCOSTATIN) 100000 UNIT/ML suspension SWISH ORALLY THEN SPIT WITH 5 ML THREE TIMES DAILY (Patient taking differently: Use as directed 5 mLs in the mouth or throat 3 (three) times daily. ) 240 mL 0  . omeprazole (PRILOSEC) 20 MG capsule Take 1 capsule (20 mg total) by mouth 2 (two) times daily before a meal. 60 capsule 0  . ondansetron (ZOFRAN) 8 MG tablet Take one tablet every 8 hours as needed for nause 30 tablet 2  . oxyCODONE (OXYCONTIN) 20 mg 12 hr tablet Take 1 tablet (20 mg total) by mouth every 12 (twelve) hours. 60 tablet 0  . potassium chloride SA (K-DUR) 20 MEQ tablet Take 1 tablet by mouth once daily 30 tablet 0  . predniSONE (DELTASONE) 10 MG tablet Take 1 tablets daily 30 tablet 1  . prochlorperazine (COMPAZINE) 10  MG tablet TAKE 1 TABLET BY MOUTH EVERY 6 HOURS AS NEEDED FOR NAUSEA FOR VOMITING 30 tablet 0  . rOPINIRole (REQUIP) 0.5 MG tablet Take 0.5 mg by mouth at bedtime as needed (For restless legs.).   11  . sucralfate (CARAFATE) 1 g tablet TAKE 1 TABLET(1 GRAM) BY MOUTH FOUR TIMES DAILY 120 tablet 0  . TRELEGY ELLIPTA 100-62.5-25 MCG/INH AEPB Inhale 1 puff into the lungs daily.   5  . zolpidem (AMBIEN) 10 MG tablet Take 10 mg by mouth at bedtime.      No current facility-administered medications for this visit.     PHYSICAL EXAMINATION: ECOG PERFORMANCE STATUS: 2 - Symptomatic, <50% confined to bed  Vitals:   12/28/18  1434  BP: 115/82  Pulse: 96  Resp: 18  Temp: 98 F (36.7 C)  SpO2: 99%   Filed Weights   12/28/18 1434  Weight: 183 lb 9.6 oz (83.3 kg)    GENERAL:alert, no distress and comfortable, on [place] oxygen SKIN: skin color, texture, turgor are normal, no rashes or significant lesions EYES: normal, Conjunctiva are pink and non-injected, sclera clear  NECK: supple, thyroid normal size, non-tender, without nodularity LYMPH:  no palpable lymphadenopathy in the cervical, axillary  LUNGS: clear to auscultation and percussion (+) Wheezing of bilateral lungs.  HEART: regular rate & rhythm and no murmurs and no lower extremity edema ABDOMEN:abdomen soft, non-tender and normal bowel sounds Musculoskeletal:no cyanosis of digits and no clubbing  NEURO: alert & oriented x 3 with fluent speech, no focal motor/sensory deficits  LABORATORY DATA:  I have reviewed the data as listed CBC Latest Ref Rng & Units 12/28/2018 12/14/2018 12/07/2018  WBC 4.0 - 10.5 K/uL 9.2 5.8 10.0  Hemoglobin 12.0 - 15.0 g/dL 9.4(L) 9.9(L) 9.7(L)  Hematocrit 36.0 - 46.0 % 30.5(L) 31.5(L) 31.4(L)  Platelets 150 - 400 K/uL 551(H) 336 553(H)     CMP Latest Ref Rng & Units 12/28/2018 12/14/2018 12/07/2018  Glucose 70 - 99 mg/dL 102(H) 109(H) 99  BUN 6 - 20 mg/dL 9 4(L) 4(L)  Creatinine 0.44 - 1.00 mg/dL 0.61 0.66 0.59  Sodium 135 - 145 mmol/L 142 139 142  Potassium 3.5 - 5.1 mmol/L 3.9 5.0 3.6  Chloride 98 - 111 mmol/L 105 104 104  CO2 22 - 32 mmol/L _0 Calcium 8.9 - 10.3 mg/dL 7.6(L) 8.0(L) 7.8(L)  Total Protein 6.5 - 8.1 g/dL 5.1(L) 5.5(L) 5.1(L)  Total Bilirubin 0.3 - 1.2 mg/dL 0.2(L) 0.3 <0.2(L)  Alkaline Phos 38 - 126 U/L 246(H) 357(H) 281(H)  AST 15 - 41 U/L 26 113(H) 30  ALT 0 - 44 U/L 20 49(H) 24      RADIOGRAPHIC STUDIES: I have personally reviewed the radiological images as listed and agreed with the findings in the report. No results found.   ASSESSMENT & PLAN:  Yvonne Lang is a 53 y.o. female  with   1. 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 -Diagnosed in 09/2017. Treated with chemo and radiation. -she has progressed through multiple line chemo, including immunotherapy -I started her on fourth line Gemcitabine2 weeks on/1 week offon 10/05/18.She tolerating moderately wellwith mild nausea, manageable. -Based on her 11/13/18 CT CAP, althoughher subcutaneous left lower chest wall nodule has increased from September 07, 2018, it has been stable since that she started gemcitabine in mid July.Other lesions are overall stable. She is tolerating gemcitabine well, I recommend tocontinuefor disease control.She agrees. -She has had recent mild increase in her subcutaneous mid chest  -  She is clinically stable. Breathing stable on 42m Prednisone. Labs reviewed, CBC and CMP WNL except Hg 9.4, PLT 551K, BG 102, Ca 7.6, Protein 5.1, albumin 4.1, Alk Phos 246. Overall adequate proceed with C5D1 Gemcitabine today. -Will schedule CT scan in 2-3 weeks. Will do virtual visit afterward to discuss results.    2.Dyspnea and dry cough secondary to her COPD,right lung cancer and radiation changes  -Overall stable, she will continuebronchodilator -In 08/2017 she had recent COPD exacerbation andPneumoniainfection. She was treated inpatient with oxygen, antibiotics and steroids. -She was discharged on 3L oxygen [place] and 476mprednisone daily and breathing treatment TID.  -Starting 09/22/18, I will have her reduce prednisone to 3069mnd continue to very slowly ween her off.  -She still has Dyspnea upon mild activities. Dry cough persists. -She has struggled to wean off prednisone, will continue 5mg92mse.She was able to reduce oxygen to 2L. -Breathing has been stable on supplemental oxygen and 5mg 70mdnisone.  -for her recent left sided chest pain from mild progression she will continue to Hydrocodone she is on. -Her wheezing has worsened recently. I  encouraged her to increase nebulizer breathing treatment at least twice a day and increase her prednisone to 10mg 29my on 12/14/18.  -Her wheezing is mild on exam today. She will continue 10mg P11misone and nebulizer treatment.   3. Frontal Headaches -f/u with Dr. Vaslow Mickeal Skinnerll stableand improved  4. Anxiety and Depression  -Currently on Xanax and Cymbalta.Stable  5. Chronic back pain -previouslyon Tramadol. Better controlled on Hydrocodone 2 tabs q6hours.  -shepreviouslytried long acting OxyContin but this did not improve her pain. She will continue with Hydrocodone which is managed by her PCP.  6. Low appetite, Nausea -Currently on Mirtazapine, omeprazole, ondansetron,prochlorperazine, and sucralfate. -Due to occasional taste change, some days she will have poor food intake. Her weight has been fluctuating.  -I previously encouraged her to start high calorie, high protein diet. She can take supplemental protein or clear ensure on days she has lower appetite.  -On 12/14/18 I increased prednisone to 10mg to64mp with diet and her breathing. If not enough I will call in appetite stimulant Megace.  -With prednisone increase her appetite has increased and she is able to gain weight.   7. Hypokalemia  -OnKCL 40meq da21m-Potassium normal today  8.Transaminitis -Probably related to chemotherapy, will continue monitoring. -LFTs normal. Alk Phos 246 (12/28/18)  9.Goal of care discussion, DNR/DNI -The patient understands the goal of care is palliative. -Wepreviouslydiscussed overall poor prognosis due to the disease progression,she has had multiple lines ofchemo, the response from future treatment will be low -We discussed DNR/DNIwhen she was recently in hospital, and she agreed DNR/DNI  Plan -Labs reviewed and adequate to proceed withC5D1 Gemcitabine today  -Lab, flush, and Gemcitabinein1 weeks -CT AP W Contrast week of 10/19 with virtual visit  afterward.    No problem-specific Assessment & Plan notes found for this encounter.   No orders of the defined types were placed in this encounter.  All questions were answered. The patient knows to call the clinic with any problems, questions or concerns. No barriers to learning was detected. I spent 15 minutes counseling the patient face to face. The total time spent in the appointment was 20 minutes and more than 50% was on counseling and review of test results     Soriyah Osberg,Truitt Merle/2020   I, Amoya BenJoslyn Devonng as scribe for Yalissa Fink,Truitt Merle have reviewed the above documentation for  accuracy and completeness, and I agree with the above.

## 2018-12-28 ENCOUNTER — Inpatient Hospital Stay: Payer: BC Managed Care – PPO

## 2018-12-28 ENCOUNTER — Inpatient Hospital Stay: Payer: BC Managed Care – PPO | Attending: Hematology

## 2018-12-28 ENCOUNTER — Encounter: Payer: Self-pay | Admitting: Hematology

## 2018-12-28 ENCOUNTER — Inpatient Hospital Stay (HOSPITAL_BASED_OUTPATIENT_CLINIC_OR_DEPARTMENT_OTHER): Payer: BC Managed Care – PPO | Admitting: Hematology

## 2018-12-28 ENCOUNTER — Other Ambulatory Visit: Payer: Self-pay

## 2018-12-28 VITALS — BP 115/82 | HR 96 | Temp 98.0°F | Resp 18 | Ht 67.0 in | Wt 183.6 lb

## 2018-12-28 DIAGNOSIS — F419 Anxiety disorder, unspecified: Secondary | ICD-10-CM | POA: Diagnosis not present

## 2018-12-28 DIAGNOSIS — C3490 Malignant neoplasm of unspecified part of unspecified bronchus or lung: Secondary | ICD-10-CM

## 2018-12-28 DIAGNOSIS — R519 Headache, unspecified: Secondary | ICD-10-CM | POA: Diagnosis not present

## 2018-12-28 DIAGNOSIS — R222 Localized swelling, mass and lump, trunk: Secondary | ICD-10-CM | POA: Diagnosis not present

## 2018-12-28 DIAGNOSIS — J189 Pneumonia, unspecified organism: Secondary | ICD-10-CM | POA: Insufficient documentation

## 2018-12-28 DIAGNOSIS — C7989 Secondary malignant neoplasm of other specified sites: Secondary | ICD-10-CM | POA: Insufficient documentation

## 2018-12-28 DIAGNOSIS — G8929 Other chronic pain: Secondary | ICD-10-CM | POA: Insufficient documentation

## 2018-12-28 DIAGNOSIS — Z20828 Contact with and (suspected) exposure to other viral communicable diseases: Secondary | ICD-10-CM | POA: Insufficient documentation

## 2018-12-28 DIAGNOSIS — Z9884 Bariatric surgery status: Secondary | ICD-10-CM | POA: Insufficient documentation

## 2018-12-28 DIAGNOSIS — J440 Chronic obstructive pulmonary disease with acute lower respiratory infection: Secondary | ICD-10-CM | POA: Diagnosis not present

## 2018-12-28 DIAGNOSIS — R110 Nausea: Secondary | ICD-10-CM | POA: Diagnosis not present

## 2018-12-28 DIAGNOSIS — Z885 Allergy status to narcotic agent status: Secondary | ICD-10-CM | POA: Insufficient documentation

## 2018-12-28 DIAGNOSIS — R635 Abnormal weight gain: Secondary | ICD-10-CM | POA: Diagnosis not present

## 2018-12-28 DIAGNOSIS — J441 Chronic obstructive pulmonary disease with (acute) exacerbation: Secondary | ICD-10-CM | POA: Diagnosis not present

## 2018-12-28 DIAGNOSIS — Z9049 Acquired absence of other specified parts of digestive tract: Secondary | ICD-10-CM | POA: Insufficient documentation

## 2018-12-28 DIAGNOSIS — Z79899 Other long term (current) drug therapy: Secondary | ICD-10-CM | POA: Insufficient documentation

## 2018-12-28 DIAGNOSIS — C787 Secondary malignant neoplasm of liver and intrahepatic bile duct: Secondary | ICD-10-CM | POA: Diagnosis not present

## 2018-12-28 DIAGNOSIS — E876 Hypokalemia: Secondary | ICD-10-CM | POA: Insufficient documentation

## 2018-12-28 DIAGNOSIS — Z66 Do not resuscitate: Secondary | ICD-10-CM | POA: Insufficient documentation

## 2018-12-28 DIAGNOSIS — Z923 Personal history of irradiation: Secondary | ICD-10-CM | POA: Insufficient documentation

## 2018-12-28 DIAGNOSIS — Z9221 Personal history of antineoplastic chemotherapy: Secondary | ICD-10-CM | POA: Diagnosis not present

## 2018-12-28 DIAGNOSIS — R7401 Elevation of levels of liver transaminase levels: Secondary | ICD-10-CM | POA: Diagnosis not present

## 2018-12-28 DIAGNOSIS — C3431 Malignant neoplasm of lower lobe, right bronchus or lung: Secondary | ICD-10-CM | POA: Insufficient documentation

## 2018-12-28 DIAGNOSIS — C7971 Secondary malignant neoplasm of right adrenal gland: Secondary | ICD-10-CM | POA: Insufficient documentation

## 2018-12-28 DIAGNOSIS — F329 Major depressive disorder, single episode, unspecified: Secondary | ICD-10-CM | POA: Diagnosis not present

## 2018-12-28 DIAGNOSIS — R0600 Dyspnea, unspecified: Secondary | ICD-10-CM | POA: Diagnosis not present

## 2018-12-28 DIAGNOSIS — R05 Cough: Secondary | ICD-10-CM | POA: Diagnosis not present

## 2018-12-28 DIAGNOSIS — I2510 Atherosclerotic heart disease of native coronary artery without angina pectoris: Secondary | ICD-10-CM | POA: Diagnosis not present

## 2018-12-28 DIAGNOSIS — I700 Atherosclerosis of aorta: Secondary | ICD-10-CM | POA: Diagnosis not present

## 2018-12-28 DIAGNOSIS — Z5111 Encounter for antineoplastic chemotherapy: Secondary | ICD-10-CM | POA: Insufficient documentation

## 2018-12-28 DIAGNOSIS — Z95828 Presence of other vascular implants and grafts: Secondary | ICD-10-CM

## 2018-12-28 LAB — CMP (CANCER CENTER ONLY)
ALT: 20 U/L (ref 0–44)
AST: 26 U/L (ref 15–41)
Albumin: 2.4 g/dL — ABNORMAL LOW (ref 3.5–5.0)
Alkaline Phosphatase: 246 U/L — ABNORMAL HIGH (ref 38–126)
Anion gap: 7 (ref 5–15)
BUN: 9 mg/dL (ref 6–20)
CO2: 30 mmol/L (ref 22–32)
Calcium: 7.6 mg/dL — ABNORMAL LOW (ref 8.9–10.3)
Chloride: 105 mmol/L (ref 98–111)
Creatinine: 0.61 mg/dL (ref 0.44–1.00)
GFR, Est AFR Am: >60 mL/min (ref >60)
GFR, Estimated: >60 mL/min (ref >60)
Glucose, Bld: 102 mg/dL — ABNORMAL HIGH (ref 70–99)
Potassium: 3.9 mmol/L (ref 3.5–5.1)
Sodium: 142 mmol/L (ref 135–145)
Total Bilirubin: 0.2 mg/dL — ABNORMAL LOW (ref 0.3–1.2)
Total Protein: 5.1 g/dL — ABNORMAL LOW (ref 6.5–8.1)

## 2018-12-28 LAB — CBC WITH DIFFERENTIAL (CANCER CENTER ONLY)
Abs Immature Granulocytes: 0.05 10*3/uL (ref 0.00–0.07)
Basophils Absolute: 0 10*3/uL (ref 0.0–0.1)
Basophils Relative: 0 %
Eosinophils Absolute: 0 10*3/uL (ref 0.0–0.5)
Eosinophils Relative: 0 %
HCT: 30.5 % — ABNORMAL LOW (ref 36.0–46.0)
Hemoglobin: 9.4 g/dL — ABNORMAL LOW (ref 12.0–15.0)
Immature Granulocytes: 1 %
Lymphocytes Relative: 8 %
Lymphs Abs: 0.7 10*3/uL (ref 0.7–4.0)
MCH: 33.1 pg (ref 26.0–34.0)
MCHC: 30.8 g/dL (ref 30.0–36.0)
MCV: 107.4 fL — ABNORMAL HIGH (ref 80.0–100.0)
Monocytes Absolute: 1 10*3/uL (ref 0.1–1.0)
Monocytes Relative: 11 %
Neutro Abs: 7.4 10*3/uL (ref 1.7–7.7)
Neutrophils Relative %: 80 %
Platelet Count: 551 10*3/uL — ABNORMAL HIGH (ref 150–400)
RBC: 2.84 MIL/uL — ABNORMAL LOW (ref 3.87–5.11)
RDW: 20.1 % — ABNORMAL HIGH (ref 11.5–15.5)
WBC Count: 9.2 10*3/uL (ref 4.0–10.5)
nRBC: 0 % (ref 0.0–0.2)

## 2018-12-28 MED ORDER — SODIUM CHLORIDE 0.9 % IV SOLN
Freq: Once | INTRAVENOUS | Status: AC
  Administered 2018-12-28: 15:00:00 via INTRAVENOUS
  Filled 2018-12-28: qty 250

## 2018-12-28 MED ORDER — PROCHLORPERAZINE MALEATE 10 MG PO TABS
10.0000 mg | ORAL_TABLET | Freq: Once | ORAL | Status: AC
  Administered 2018-12-28: 10 mg via ORAL

## 2018-12-28 MED ORDER — SODIUM CHLORIDE 0.9% FLUSH
10.0000 mL | INTRAVENOUS | Status: DC | PRN
  Administered 2018-12-28: 10 mL
  Filled 2018-12-28: qty 10

## 2018-12-28 MED ORDER — HEPARIN SOD (PORK) LOCK FLUSH 100 UNIT/ML IV SOLN
500.0000 [IU] | Freq: Once | INTRAVENOUS | Status: AC | PRN
  Administered 2018-12-28: 500 [IU]
  Filled 2018-12-28: qty 5

## 2018-12-28 MED ORDER — SODIUM CHLORIDE 0.9 % IV SOLN
2000.0000 mg | Freq: Once | INTRAVENOUS | Status: AC
  Administered 2018-12-28: 2000 mg via INTRAVENOUS
  Filled 2018-12-28: qty 52.6

## 2018-12-28 MED ORDER — PROCHLORPERAZINE MALEATE 10 MG PO TABS
ORAL_TABLET | ORAL | Status: AC
  Filled 2018-12-28: qty 1

## 2018-12-28 NOTE — Patient Instructions (Signed)
[facility] °Discharge Instructions for Patients Receiving Chemotherapy ° °Today you received the following chemotherapy agents Gemzar ° °To help prevent nausea and vomiting after your treatment, we encourage you to take your nausea medication as directed. °  °If you develop nausea and vomiting that is not controlled by your nausea medication, call the clinic.  ° °BELOW ARE SYMPTOMS THAT SHOULD BE REPORTED IMMEDIATELY: °· *FEVER GREATER THAN 100.5 F °· *CHILLS WITH OR WITHOUT FEVER °· NAUSEA AND VOMITING THAT IS NOT CONTROLLED WITH YOUR NAUSEA MEDICATION °· *UNUSUAL SHORTNESS OF BREATH °· *UNUSUAL BRUISING OR BLEEDING °· TENDERNESS IN MOUTH AND THROAT WITH OR WITHOUT PRESENCE OF ULCERS °· *URINARY PROBLEMS °· *BOWEL PROBLEMS °· UNUSUAL RASH °Items with * indicate a potential emergency and should be followed up as soon as possible. ° °Feel free to call the clinic should you have any questions or concerns. The clinic phone number is [phone]. ° °Please show the CHEMO ALERT CARD at check-in to the Emergency Department and triage nurse. ° ° °

## 2018-12-29 ENCOUNTER — Telehealth: Payer: Self-pay | Admitting: Hematology

## 2018-12-29 ENCOUNTER — Encounter: Payer: Self-pay | Admitting: Hematology

## 2018-12-29 NOTE — Telephone Encounter (Signed)
Scheduled appt per 10/8 los.  Left a vm of the appt date and time...i also left central radiology number on the voice message also.

## 2019-01-03 ENCOUNTER — Telehealth: Payer: Self-pay | Admitting: Nurse Practitioner

## 2019-01-03 NOTE — Telephone Encounter (Signed)
Per YF changed time of 10/21 telephone visit with LB. Confirmed with patient.

## 2019-01-04 ENCOUNTER — Inpatient Hospital Stay: Payer: BC Managed Care – PPO

## 2019-01-04 ENCOUNTER — Encounter: Payer: Self-pay | Admitting: Nurse Practitioner

## 2019-01-04 ENCOUNTER — Ambulatory Visit (HOSPITAL_COMMUNITY)
Admission: RE | Admit: 2019-01-04 | Discharge: 2019-01-04 | Disposition: A | Discharge status: Home / Self Care | Payer: BC Managed Care – PPO | Source: Ambulatory Visit | Attending: Nurse Practitioner | Admitting: Nurse Practitioner

## 2019-01-04 ENCOUNTER — Inpatient Hospital Stay (HOSPITAL_BASED_OUTPATIENT_CLINIC_OR_DEPARTMENT_OTHER): Payer: BC Managed Care – PPO | Admitting: Nurse Practitioner

## 2019-01-04 ENCOUNTER — Other Ambulatory Visit: Payer: Self-pay

## 2019-01-04 VITALS — BP 123/80 | HR 105 | Temp 98.7°F | Resp 17 | Ht 67.0 in | Wt 184.0 lb

## 2019-01-04 VITALS — HR 102

## 2019-01-04 DIAGNOSIS — C3490 Malignant neoplasm of unspecified part of unspecified bronchus or lung: Secondary | ICD-10-CM | POA: Insufficient documentation

## 2019-01-04 DIAGNOSIS — Z95828 Presence of other vascular implants and grafts: Secondary | ICD-10-CM

## 2019-01-04 DIAGNOSIS — R110 Nausea: Secondary | ICD-10-CM | POA: Diagnosis not present

## 2019-01-04 DIAGNOSIS — C7989 Secondary malignant neoplasm of other specified sites: Secondary | ICD-10-CM | POA: Diagnosis not present

## 2019-01-04 DIAGNOSIS — R7401 Elevation of levels of liver transaminase levels: Secondary | ICD-10-CM | POA: Diagnosis not present

## 2019-01-04 DIAGNOSIS — R0600 Dyspnea, unspecified: Secondary | ICD-10-CM | POA: Diagnosis not present

## 2019-01-04 DIAGNOSIS — J90 Pleural effusion, not elsewhere classified: Secondary | ICD-10-CM | POA: Diagnosis not present

## 2019-01-04 DIAGNOSIS — J441 Chronic obstructive pulmonary disease with (acute) exacerbation: Secondary | ICD-10-CM | POA: Diagnosis not present

## 2019-01-04 DIAGNOSIS — R222 Localized swelling, mass and lump, trunk: Secondary | ICD-10-CM | POA: Diagnosis not present

## 2019-01-04 DIAGNOSIS — C787 Secondary malignant neoplasm of liver and intrahepatic bile duct: Secondary | ICD-10-CM | POA: Diagnosis not present

## 2019-01-04 DIAGNOSIS — J440 Chronic obstructive pulmonary disease with acute lower respiratory infection: Secondary | ICD-10-CM | POA: Diagnosis not present

## 2019-01-04 DIAGNOSIS — R519 Headache, unspecified: Secondary | ICD-10-CM | POA: Diagnosis not present

## 2019-01-04 DIAGNOSIS — I700 Atherosclerosis of aorta: Secondary | ICD-10-CM | POA: Diagnosis not present

## 2019-01-04 DIAGNOSIS — R05 Cough: Secondary | ICD-10-CM | POA: Diagnosis not present

## 2019-01-04 DIAGNOSIS — Z20828 Contact with and (suspected) exposure to other viral communicable diseases: Secondary | ICD-10-CM | POA: Diagnosis not present

## 2019-01-04 DIAGNOSIS — Z66 Do not resuscitate: Secondary | ICD-10-CM | POA: Diagnosis not present

## 2019-01-04 DIAGNOSIS — C3431 Malignant neoplasm of lower lobe, right bronchus or lung: Secondary | ICD-10-CM | POA: Diagnosis not present

## 2019-01-04 DIAGNOSIS — Z5111 Encounter for antineoplastic chemotherapy: Secondary | ICD-10-CM | POA: Diagnosis not present

## 2019-01-04 DIAGNOSIS — J189 Pneumonia, unspecified organism: Secondary | ICD-10-CM | POA: Diagnosis not present

## 2019-01-04 LAB — CMP (CANCER CENTER ONLY)
ALT: 65 U/L — ABNORMAL HIGH (ref 0–44)
AST: 236 U/L (ref 15–41)
Albumin: 2.5 g/dL — ABNORMAL LOW (ref 3.5–5.0)
Alkaline Phosphatase: 351 U/L — ABNORMAL HIGH (ref 38–126)
Anion gap: 7 (ref 5–15)
BUN: <4 mg/dL — ABNORMAL LOW (ref 6–20)
CO2: 29 mmol/L (ref 22–32)
Calcium: 8.2 mg/dL — ABNORMAL LOW (ref 8.9–10.3)
Chloride: 99 mmol/L (ref 98–111)
Creatinine: 0.66 mg/dL (ref 0.44–1.00)
GFR, Est AFR Am: >60 mL/min (ref >60)
GFR, Estimated: >60 mL/min (ref >60)
Glucose, Bld: 111 mg/dL — ABNORMAL HIGH (ref 70–99)
Potassium: 4.1 mmol/L (ref 3.5–5.1)
Sodium: 135 mmol/L (ref 135–145)
Total Bilirubin: 0.5 mg/dL (ref 0.3–1.2)
Total Protein: 5.4 g/dL — ABNORMAL LOW (ref 6.5–8.1)

## 2019-01-04 LAB — CBC WITH DIFFERENTIAL (CANCER CENTER ONLY)
Abs Immature Granulocytes: 0.38 10*3/uL — ABNORMAL HIGH (ref 0.00–0.07)
Basophils Absolute: 0 10*3/uL (ref 0.0–0.1)
Basophils Relative: 1 %
Eosinophils Absolute: 0 10*3/uL (ref 0.0–0.5)
Eosinophils Relative: 0 %
HCT: 31.5 % — ABNORMAL LOW (ref 36.0–46.0)
Hemoglobin: 9.9 g/dL — ABNORMAL LOW (ref 12.0–15.0)
Immature Granulocytes: 5 %
Lymphocytes Relative: 9 %
Lymphs Abs: 0.7 10*3/uL (ref 0.7–4.0)
MCH: 33.3 pg (ref 26.0–34.0)
MCHC: 31.4 g/dL (ref 30.0–36.0)
MCV: 106.1 fL — ABNORMAL HIGH (ref 80.0–100.0)
Monocytes Absolute: 1 10*3/uL (ref 0.1–1.0)
Monocytes Relative: 12 %
Neutro Abs: 5.7 10*3/uL (ref 1.7–7.7)
Neutrophils Relative %: 73 %
Platelet Count: 462 10*3/uL — ABNORMAL HIGH (ref 150–400)
RBC: 2.97 MIL/uL — ABNORMAL LOW (ref 3.87–5.11)
RDW: 18 % — ABNORMAL HIGH (ref 11.5–15.5)
WBC Count: 7.8 10*3/uL (ref 4.0–10.5)
nRBC: 2.2 % — ABNORMAL HIGH (ref 0.0–0.2)

## 2019-01-04 MED ORDER — HEPARIN SOD (PORK) LOCK FLUSH 100 UNIT/ML IV SOLN
500.0000 [IU] | Freq: Once | INTRAVENOUS | Status: AC | PRN
  Administered 2019-01-04: 500 [IU]
  Filled 2019-01-04: qty 5

## 2019-01-04 MED ORDER — SODIUM CHLORIDE 0.9% FLUSH
10.0000 mL | INTRAVENOUS | Status: DC | PRN
  Administered 2019-01-04: 10 mL
  Filled 2019-01-04: qty 10

## 2019-01-04 MED ORDER — SODIUM CHLORIDE 0.9% FLUSH
10.0000 mL | INTRAVENOUS | Status: DC | PRN
  Administered 2019-01-04: 17:00:00 10 mL
  Filled 2019-01-04: qty 10

## 2019-01-04 MED ORDER — PROCHLORPERAZINE MALEATE 10 MG PO TABS
10.0000 mg | ORAL_TABLET | Freq: Once | ORAL | Status: AC
  Administered 2019-01-04: 16:00:00 10 mg via ORAL

## 2019-01-04 MED ORDER — SODIUM CHLORIDE 0.9 % IV SOLN
2000.0000 mg | Freq: Once | INTRAVENOUS | Status: AC
  Administered 2019-01-04: 17:00:00 2000 mg via INTRAVENOUS
  Filled 2019-01-04: qty 52.6

## 2019-01-04 MED ORDER — PROCHLORPERAZINE MALEATE 10 MG PO TABS
ORAL_TABLET | ORAL | Status: AC
  Filled 2019-01-04: qty 1

## 2019-01-04 MED ORDER — SODIUM CHLORIDE 0.9 % IV SOLN
Freq: Once | INTRAVENOUS | Status: AC
  Administered 2019-01-04: 16:00:00 via INTRAVENOUS
  Filled 2019-01-04: qty 250

## 2019-01-04 NOTE — Progress Notes (Signed)
CRITICAL VALUE STICKER  CRITICAL VALUE:  AST 236  RECEIVER (on-site recipient of call): Valda Favia RN  [place] NOTIFIED: 01/04/2019 1530  MESSENGER (representative from lab):Verdis Frederickson  MD NOTIFIED: Cira Rue NP   TIME OF NOTIFICATION:1530  RESPONSE: No new orders received at this time.

## 2019-01-04 NOTE — Progress Notes (Signed)
Per Cira Rue, NP, okay to treat with AST 236 and HR 102.

## 2019-01-04 NOTE — Patient Instructions (Signed)

## 2019-01-04 NOTE — Patient Instructions (Signed)
[facility] °Discharge Instructions for Patients Receiving Chemotherapy ° °Today you received the following chemotherapy agents Gemzar ° °To help prevent nausea and vomiting after your treatment, we encourage you to take your nausea medication as directed. °  °If you develop nausea and vomiting that is not controlled by your nausea medication, call the clinic.  ° °BELOW ARE SYMPTOMS THAT SHOULD BE REPORTED IMMEDIATELY: °· *FEVER GREATER THAN 100.5 F °· *CHILLS WITH OR WITHOUT FEVER °· NAUSEA AND VOMITING THAT IS NOT CONTROLLED WITH YOUR NAUSEA MEDICATION °· *UNUSUAL SHORTNESS OF BREATH °· *UNUSUAL BRUISING OR BLEEDING °· TENDERNESS IN MOUTH AND THROAT WITH OR WITHOUT PRESENCE OF ULCERS °· *URINARY PROBLEMS °· *BOWEL PROBLEMS °· UNUSUAL RASH °Items with * indicate a potential emergency and should be followed up as soon as possible. ° °Feel free to call the clinic should you have any questions or concerns. The clinic phone number is [phone]. ° °Please show the CHEMO ALERT CARD at check-in to the Emergency Department and triage nurse. ° ° °

## 2019-01-04 NOTE — Progress Notes (Signed)
Yvonne Lang   [phone] Fax:([phone]) [phone]   Clinic Follow up Note   Patient Care Team: Redmond School, MD as PCP - General (Internal Medicine) 01/04/2019  CHIEF COMPLAINT: f/u metastatic non-small cell lung cancer   SUMMARY OF ONCOLOGIC HISTORY: Oncology History Overview Note  Cancer Staging Metastatic non-small cell lung cancer [facility]) Staging form: Lung, AJCC 8th Edition - Clinical stage from 10/17/2017: Stage IV (cT3, cN1, cM1b) - Signed by Truitt Merle, MD on 10/17/2017     Metastatic non-small cell lung cancer ([place])  09/20/2017 Imaging   US Breast Left 09/20/17  IMPRESSION Indeterminte palpable mass measuring 3.2x1.7x2.8 cm  inferior medial to the inframammary fold of the left breast.     09/20/2017 Initial Biopsy   Diagnosis 09/20/17 Soft Tissue Needle Core Biopsy, inferior medial to IMF - ADENOCARCINOMA. - SEE MICROSCOPIC DESCRIPTION.  Microscopic Comment Immunohistochemistry will be performed and reported as an addendum. (JDP:ah 09/23/17) ADDENDUM: Immunohistochemistry shows the tumor is strongly positive with cytokeratin AE1/AE3, cytokeratin 7 and shows moderate weak positivity with CDX-2. The tumor is negative with estrogen receptor, progesterone receptor, GCDFP, GATA-3, Napsin A, TTF-1, WT-1 and cytokeratin 20. The immunophenotype is consistent with metastatic carcinoma. The combination of CDX-2 and cytokeratin 7 positivity raises the possibility of an upper gastrointestinal primary. Clinical correlation is essential.   10/06/2017 Initial Diagnosis   Metastatic adenocarcinoma involving soft tissue with unknown primary site [facility])   10/10/2017 Procedure   Upper Endoscopy by Dr. Silverio Decamp 10/10/17  IMPRESSION - Normal esophagus. - Z-line regular, 35 cm from the incisors. - Gastric bypass with a normal-sized pouch and intact staple line. Gastrojejunal anastomosis characterized by healthy appearing mucosa. - No specimens collected.   10/12/2017  Imaging   CT CAP WO Contrast 10/12/17  IMPRESSION: 7 cm central right lung mass involving the right hilum, with postobstructive collapse of the right middle lobe, highly suspicious for primary bronchogenic carcinoma. 5 cm masslike opacity in the superior segment of the right lower lobe may represent carcinoma or postobstructive pneumonitis. 3.3 cm soft tissue mass in the lower anterior chest wall soft tissues, suspicious for metastatic disease. No evidence of abdominal or pelvic metastatic disease.    10/14/2017 Imaging   MRI Brain 10/14/17  IMPRESSION: 1. No metastatic disease or acute intracranial abnormality. Normal MRI appearance of the brain. 2. 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     10/17/2017 Cancer Staging   Staging form: Lung, AJCC 8th Edition - Clinical stage from 10/17/2017: Stage IV (cT3, cN1, cM1b) - Signed by Truitt Merle, MD on 10/17/2017   10/24/2017 - 11/04/2017 Radiation Therapy    The Right lung mass was treated to 30 Gy in 10 fractions of 3 Gy   11/09/2017 - 03/24/2018 Chemotherapy   -first line chemo with carboplatin, paclitaxel, Atezolizumab and bevacizumab every 3weeks starting 11/08/17.  -Switched to maintenance therapy with avastin and atezolizumab q3weeks on 03/24/18   01/10/2018 Imaging   01/10/2018 CT CA IMPRESSION: 1. Response to therapy of central right lower lobe lung mass. Re-expansion of the right middle lobe with significantly improved right lower lobe postobstructive pneumonitis. Residual geographic airspace and ground-glass opacity within the medial right lung, primarily favored to be radiation induced. 2. Near complete resolution of presternal soft tissue mass. 3. New right adrenal nodule since 10/11/2017, suspicious for metastatic disease. 4. Right-sided Port-A-Cath with probable nonocclusive thrombus along the catheter within the right brachiocephalic vein. 5. Age advanced coronary artery atherosclerosis. Recommend assessment  of coronary risk factors and consideration of  medical therapy. 6. Aortic atherosclerosis (ICD10-I70.0) and emphysema (ICD10-J43.9).   03/10/2018 Imaging   CT CAP W Contrast 03/10/18 IMPRESSION: 1. Radiation changes involving the right paramediastinal lung and right hilum but no findings suspicious for residual or recurrent tumor. 2. No evidence of pulmonary metastatic disease. Stable emphysematous changes and areas of pulmonary scarring. 3. Stable 11 mm right adrenal gland nodule. 4. No findings for metastatic disease involving the liver or bony structures.   03/24/2018 - 04/24/2018 Chemotherapy   The patient had bevacizumab (AVASTIN) 1,300 mg in sodium chloride 0.9 % 100 mL chemo infusion, 15.5 mg/kg = 1,275 mg, Intravenous,  Once, 1 of 4 cycles Administration: 1,300 mg (03/24/2018) atezolizumab (TECENTRIQ) 1,200 mg in sodium chloride 0.9 % 250 mL chemo infusion, 1,200 mg, Intravenous, Once, 1 of 4 cycles Administration: 1,200 mg (03/24/2018)  for chemotherapy treatment.    04/11/2018 Imaging   CT ANGIO CHEST PE W OR WO CONTRAST  IMPRESSION: 1. No evidence of acute pulmonary embolism. 2. Progressive radiation changes in the right perihilar region. Cavitary retro hilar mass and right paratracheal lymph node have enlarged, suspicious for local recurrence/disease progression. 3. Enlarging right adrenal nodule suspicious for metastatic disease. 4. New right pleural effusion with increased atelectasis at both lung bases. 5. These results will be called to the ordering clinician or representative by the Radiology Department at the imaging location.    04/21/2018 Imaging   NUCLEAR MEDICINE PET SKULL BASE TO THIGH IMPRESSION: 1. Marked hypermetabolism in the amorphous soft tissue involving the right hilum. This represents the central component of the apparent radiation scarring. 2. Hypermetabolic mediastinal nodal metastases with nodal metastases identified in the anterior right juxta  diaphragmatic fat. 3. Hypermetabolic right adrenal metastasis. 4. Interval progression of loculated right pleural effusion.    05/02/2018 - 07/25/2018 Chemotherapy   Second line Alimta 566m/m2 every 3 weeks, started 05/02/2018. D/c due to disease pregression after 4 cycles     07/21/2018 Imaging   CT CAP 07/21/18  IMPRESSION: 1. Enlarging right adrenal metastatic lesion a mildly enlarging mediastinal adenopathy compatible with mild progression of disease. No new metastatic foci are identified. 2. Evolutionary findings along the right perihilar/paramediastinal radiation fibrosis including some improvement in aeration in the right upper lobe, but enlargement of the cavitary/centrally necrotic region in the superior segment right lower lobe, which is gas-filled and which probably connects to the otherwise focally occluded bronchus intermedius. 3. Other imaging findings of potential clinical significance: Probable left anterior descending coronary atherosclerosis. Mild to moderate intrahepatic biliary dilatation mild extrahepatic biliary dilatation, much of which may be a physiologic response to cholecystectomy. Prominent stool throughout the colon favors constipation. Aortic Atherosclerosis (ICD10-I70.0).   07/26/2018 - 08/17/2018 Chemotherapy   Third-line docetaxel and cyramza q3 weeks on 07/26/18. D/c after cycle 2 due to PNA and mild disease progression   09/07/2018 Imaging   CT Angio Chest 09/07/18  IMPRESSION: 1. Detection of pulmonary emboli is severely limited by motion artifact and suboptimal contrast bolus timing. Given this limitation, no PE was identified. The pulmonary arteries are dilated which can be seen in patients with elevated pulmonary artery pressures. 2. Diffuse ground-glass airspace opacities involving the right upper lobe, left upper lobe, and left lower lobe. Findings are concerning for multifocal pneumonia (viral or bacterial). 3. Persistent ill-defined soft tissue  in the right perihilar region with multiple pathologically enlarged mediastinal and hilar lymph nodes. There is a growing air-filled cavitary area within the right perihilar region which may be related to post treatment changes. 4.  Small right-sided pleural effusion. 5. Growing subcutaneous soft tissue nodule in the anterior midline left chest wall as detailed above. This could represent a metastatic deposit. Again noted is a right adrenal metastatic lesion as detailed above.   Aortic Atherosclerosis (ICD10-I70.0).   09/07/2018 - 09/14/2018 Hospital Admission   Admit date: 09/07/18 Admission diagnosis: COPD exacerbation, PNA Additional comments: After 3 cycles of third-line chemo she was admitted for COPD and PNA. She was placed on oxygen and treated with antibotics.    10/05/2018 -  Chemotherapy   Fourth-line Weekly Gemcitabine starting 10/05/18   10/11/2018 Imaging   CT AP W Contrast  IMPRESSION: 1. Enlarging right adrenal gland metastatic lesion. 2. Enlarging subcutaneous soft tissue lesions in the lower anterior chest wall and the anterior abdominal wall. 3. New soft tissue lesion noted in the lower aspect of the right ischial rectal fossa. 4. Stable intra and extrahepatic biliary dilatation. No findings for hepatic metastatic disease. 5. No acute abdominal/pelvic findings or lymphadenopathy. 6. No definite bone lesions are identified. Specifically I do not see a definite cause for the patient's right hip pain. MRI would be suggested for further evaluation.   11/13/2018 Imaging   CT CAP IMPRESSION: 1. Mild mixed interval changes. Mild right paratracheal adenopathy and small soft tissue metastasis in the supraumbilical right abdominal wall have mildly decreased. Right perihilar soft tissue, ventral left chest wall soft tissue metastasis and right adrenal metastasis have slightly increased. New 5 mm apical left upper lobe pulmonary nodule, metastasis not excluded. 2. Small right  pleural effusion, slightly increased. 3.  Aortic Atherosclerosis (ICD10-I70.0).     CURRENT THERAPY: Fourth-line Gemcitabine2 weeks on/1 week offstarting7/16/20  INTERVAL HISTORY: Yvonne Lang returns for f/u and treatment as scheduled. She completed cycle 5 day 1 gemcitabine on 10/8. Last week she felt "suffocated" and needed to increase oxygen to 3 liters. She can't take O2 off as long as she used to, and activity level is decreasing due to dyspnea. Dyspnea on exertion is worse She gets very winded during shower and helping mother unload groceries. She is bothered because she feels like she can't help out like she wants to. Dry cough is at baseline. Denies chest pain. No recent fever or chills. Chest and pelvic nodules are enlarging and painful. She had some panicking and depression this week; had to put her dog down who was like a best friend. She is tearful. Headaches are infrequent, she occasionally takes tylenol for breakthrough in addition to 2 tabs norco q6 for other pain. Otherwise she is doing OK, stable appetite and intermittent nausea. No constipation, diarrhea, vomiting. No signs of thrombosis.   MEDICAL HISTORY:  Past Medical History:  Diagnosis Date  . Anxiety   . Bronchitis   . COPD (chronic obstructive pulmonary disease) ([place])   . Depression   . GERD (gastroesophageal reflux disease)   . H/O gastric bypass    1999  . Irritable bowel syndrome   . Lung cancer [facility])     SURGICAL HISTORY: Past Surgical History:  Procedure Laterality Date  . BACK SURGERY    . CHOLECYSTECTOMY    . COLON SURGERY     for a kink in her colon and 12 inchs removed  . COLONOSCOPY N/A 12/13/2013   Procedure: COLONOSCOPY;  Surgeon: Rogene Houston, MD;  Location: [facility];  Service: Endoscopy;  Laterality: N/A;  200  . ESOPHAGOGASTRODUODENOSCOPY N/A 12/13/2013   Procedure: ESOPHAGOGASTRODUODENOSCOPY (EGD);  Surgeon: Rogene Houston, MD;  Location: [facility] ENDO SUITE;  Service: Endoscopy;   Laterality: N/A;  . Gastric Bypas  2000  . IR IMAGING GUIDED PORT INSERTION  11/14/2017    I have reviewed the social history and family history with the patient and they are unchanged from previous note.  ALLERGIES:  is allergic to codeine.  MEDICATIONS:  Current Outpatient Medications  Medication Sig Dispense Refill  . albuterol (PROVENTIL HFA) 108 (90 Base) MCG/ACT inhaler Inhale 2 puffs into the lungs 2 (two) times daily. 0.006 g 1  . ALPRAZolam (XANAX) 0.25 MG tablet Take 1 tablet (0.25 mg total) by mouth at bedtime as needed for anxiety or sleep. 30 tablet 0  . butalbital-acetaminophen-caffeine (FIORICET, ESGIC) 50-325-40 MG tablet Take 1-2 tablets by mouth every 6 (six) hours as needed for headache. 60 tablet 1  . CALCIUM-MAGNESIUM-ZINC PO Take 1 tablet by mouth daily.    Marland Kitchen dexamethasone (DECADRON) 4 MG tablet Take 2 tablets (8 mg total) by mouth 2 (two) times daily. Start the day before Taxotere, then take on day after chemo for 2 days 30 tablet 1  . dicyclomine (BENTYL) 20 MG tablet Take 1 tablet (20 mg total) by mouth every 6 (six) hours. 60 tablet 1  . FLUoxetine (PROZAC) 40 MG capsule Take 80 mg by mouth daily.    . furosemide (LASIX) 20 MG tablet Take 1 tablet (20 mg total) by mouth daily as needed. (Patient taking differently: Take 20 mg by mouth daily as needed for fluid or edema. ) 30 tablet 0  . gabapentin (NEURONTIN) 300 MG capsule Take 300 mg by mouth daily as needed (for pain).   4  . guaiFENesin (MUCINEX) 600 MG 12 hr tablet Take 1 tablet (600 mg total) by mouth 2 (two) times daily. 60 tablet 0  . HYDROcodone-acetaminophen (NORCO) 10-325 MG tablet Take 2 tablets by mouth every 6 (six) hours as needed (For pain.).     Marland Kitchen ipratropium-albuterol (DUONEB) 0.5-2.5 (3) MG/3ML SOLN Take 3 mLs by nebulization every 4 (four) hours as needed. (Patient taking differently: Take 3 mLs by nebulization every 4 (four) hours as needed (for shortness of breath). ) 360 mL 1  .  lidocaine-prilocaine (EMLA) cream APPLY 1 APPLICATION TO PORT TOPICALLY AS NEEDED ONE HOUR PRIOR TO PORT ACCESS/CHEMO (Patient taking differently: Apply 1 application topically as needed (For port-a-cath.). ) 30 g 0  . magic mouthwash w/lidocaine SOLN Take 5 mLs by mouth 3 (three) times daily as needed for mouth pain. 240 mL 0  . Multiple Vitamin (MULTIVITAMIN) tablet Take 1 tablet by mouth daily.    Marland Kitchen nystatin (MYCOSTATIN) 100000 UNIT/ML suspension SWISH ORALLY THEN SPIT WITH 5 ML THREE TIMES DAILY (Patient taking differently: Use as directed 5 mLs in the mouth or throat 3 (three) times daily. ) 240 mL 0  . omeprazole (PRILOSEC) 20 MG capsule Take 1 capsule (20 mg total) by mouth 2 (two) times daily before a meal. 60 capsule 0  . ondansetron (ZOFRAN) 8 MG tablet Take one tablet every 8 hours as needed for nause 30 tablet 2  . oxyCODONE (OXYCONTIN) 20 mg 12 hr tablet Take 1 tablet (20 mg total) by mouth every 12 (twelve) hours. 60 tablet 0  . potassium chloride SA (K-DUR) 20 MEQ tablet Take 1 tablet by mouth once daily 30 tablet 0  . predniSONE (DELTASONE) 10 MG tablet Take 1 tablets daily 30 tablet 1  . prochlorperazine (COMPAZINE) 10 MG tablet TAKE 1 TABLET BY MOUTH EVERY 6 HOURS AS NEEDED FOR NAUSEA FOR VOMITING 30 tablet 0  .  rOPINIRole (REQUIP) 0.5 MG tablet Take 0.5 mg by mouth at bedtime as needed (For restless legs.).   11  . sucralfate (CARAFATE) 1 g tablet TAKE 1 TABLET(1 GRAM) BY MOUTH FOUR TIMES DAILY 120 tablet 0  . TRELEGY ELLIPTA 100-62.5-25 MCG/INH AEPB Inhale 1 puff into the lungs daily.   5  . zolpidem (AMBIEN) 10 MG tablet Take 10 mg by mouth at bedtime.      No current facility-administered medications for this visit.    Facility-Administered Medications Ordered in Other Visits  Medication Dose Route Frequency Provider Last Rate Last Dose  . gemcitabine (GEMZAR) 2,000 mg in sodium chloride 0.9 % 250 mL chemo infusion  2,000 mg Intravenous Once Truitt Merle, MD      . heparin lock  flush 100 unit/mL  500 Units Intracatheter Once PRN Truitt Merle, MD      . sodium chloride flush (NS) 0.9 % injection 10 mL  10 mL Intracatheter PRN Truitt Merle, MD        PHYSICAL EXAMINATION: ECOG PERFORMANCE STATUS: 2 - Symptomatic, <50% confined to bed  Vitals:   01/04/19 1447  BP: 123/80  Pulse: (!) 105  Resp: 17  Temp: 98.7 F (37.1 C)  SpO2: 98%   Filed Weights   01/04/19 1447  Weight: 184 lb (83.5 kg)    GENERAL:alert, no distress and comfortable SKIN: no rash  EYES:  sclera clear LYMPH:  no palpable cervical or supraclavicular lymphadenopathy LUNGS: wheezing throughout, normal breathing effort on 3 liters O2 CHEST: approx 3 cm erythematous chest nodule  HEART: tachycardic, regular rhythm, no lower extremity edema ABDOMEN: abdomen soft, non-tender and normal bowel sounds. Palpable nodule in the low pelvis, approx 1 cm Musculoskeletal:no cyanosis of digits  NEURO: alert & oriented x 3 with fluent speech, normal gait PAC without erythema   LABORATORY DATA:  I have reviewed the data as listed CBC Latest Ref Rng & Units 01/04/2019 12/28/2018 12/14/2018  WBC 4.0 - 10.5 K/uL 7.8 9.2 5.8  Hemoglobin 12.0 - 15.0 g/dL 9.9(L) 9.4(L) 9.9(L)  Hematocrit 36.0 - 46.0 % 31.5(L) 30.5(L) 31.5(L)  Platelets 150 - 400 K/uL 462(H) 551(H) 336     CMP Latest Ref Rng & Units 01/04/2019 12/28/2018 12/14/2018  Glucose 70 - 99 mg/dL 111(H) 102(H) 109(H)  BUN 6 - 20 mg/dL <4(L) 9 4(L)  Creatinine 0.44 - 1.00 mg/dL 0.66 0.61 0.66  Sodium 135 - 145 mmol/L 135 142 139  Potassium 3.5 - 5.1 mmol/L 4.1 3.9 5.0  Chloride 98 - 111 mmol/L 99 105 104  CO2 22 - 32 mmol/L _0 Calcium 8.9 - 10.3 mg/dL 8.2(L) 7.6(L) 8.0(L)  Total Protein 6.5 - 8.1 g/dL 5.4(L) 5.1(L) 5.5(L)  Total Bilirubin 0.3 - 1.2 mg/dL 0.5 0.2(L) 0.3  Alkaline Phos 38 - 126 U/L 351(H) 246(H) 357(H)  AST 15 - 41 U/L 236(HH) 26 113(H)  ALT 0 - 44 U/L 65(H) 20 49(H)      RADIOGRAPHIC STUDIES: I have personally reviewed the  radiological images as listed and agreed with the findings in the report. No results found.   ASSESSMENT & PLAN: Yvonne Lang is a 53 y.o. female with   1. 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 -Diagnosed in 09/2017. Treated with chemo and radiation. -she has progressed through multiple line chemo, including immunotherapy -She started fourth line Gemcitabine2 weeks on/1 week offon 10/05/18.She tolerating moderately wellwith mild nausea, she is able to recover well . -11/13/18 CT CAP showed stable  chest nodule since starting chemo in 09/2018, other lesions stable and no new metastasis. She continues treatment -Ms. Medine appears stable today. She completed cycle 5 day 1 chemo, she tolerates moderately well with controlled nausea.  -She developed worsening dyspnea and needed more O2, now up to 3 liters. No change in her cough or fever. I do not think she has acute infection -will r/o effusion, if so will schedule thoracentesis. If there is not pleural effusion then this is likely related to disease progression -Restaging CT scheduled on 10/19.  -Labs reviewed. CBC is stable. She has worsening transaminitis. Possibly related to chemo/medication vs metastasis. She had no liver lesion on previous scans.  -OK to treat without dose reduction -F/u next week phone call to review her scan. -I reviewed the plan with Dr. Burr Medico who agrees   2.Dyspnea and dry cough secondary to her COPD,right lung cancer and radiation changes  -On continuebronchodilator -In 08/2017 she had recent COPD exacerbation andPneumoniainfection. She was treated inpatient with oxygen, antibiotics and steroids. -She was discharged on 3L oxygen [place] and 23m prednisone daily and breathing treatment TID.  -she was able to taper down to 5 mg prednisone but could not discontinue altogether, she is now back up to 10 mg -Dyspnea was well controlled on 2 L O2 for several months,  however this week she has increased dyspnea and more O2 requirement. Now up to 3 L  -She can go up 1-2 liters if needed during activity/ADL. If she requires more than 4-5 liters, I encouraged her to call me to determine if she needs to be seen or needs additional work up -She is wheezing on exam -I obtained chest xray today; image shows consolidation vs effusion in the right lung. Will await the written report.  -symptoms could also be related to disease progression.  -If she has pleural effusion I will refer her for thoracentesis  3. Frontal Headaches -f/u with Dr. VMickeal Skinner-overall stableand improved -headaches are infrequent, uses tylenol PRN  4. Anxiety and Depression  -Currently on Xanax and Cymbalta. -she had more depression this week when she had to put her dog down, she is tearful today.  -she continues to have positive but realistic outlook  5. Chronic back pain -previouslyon Tramadol. Better controlled on Hydrocodone 2 tabs q6hours.  -shepreviouslytried long acting OxyContin but this did not improve her pain. She will continue with Hydrocodone which is managed by her PCP. -stable  6. Low appetite, Nausea -Currently on Mirtazapine, omeprazole, ondansetron,prochlorperazine, and sucralfate. -improved with prednisone 10 mg   7. Hypokalemia  -OnKCL 257m daily -normal  8.Transaminitis -Likely related to chemotherapy -Alk Phos with stable elevation -AST acutely elevated to 236 today, ALT 65, alk phos 351 -Although could be related to chemotherapy vs medication, I am concerned about disease progression.  -I encouraged her to limit tylenol for now, she does not use alcohol. Will monitor on CT scan on 10/19.  9.Goal of care discussion, DNR/DNI -she understands the incurable nature of her disease and her treatment goal is palliative -she previously agreed DNR/DNI  PLAN: -Labs reviewed -Proceed with cycle 5 day 8 gemcitabine today, no dosage adjustment  -Chest xray today, if pleural effusion is present will refer for thoracentesis  -Restaging CT on 10/19 -Phone f/u next week to review scans   No problem-specific Assessment & Plan notes found for this encounter.   Orders Placed This Encounter  Procedures  . DG Chest 2 View    Standing Status:   Future  Number of Occurrences:   1    Standing Expiration Date:   01/04/2020    Order Specific Question:   Reason for Exam (SYMPTOM  OR DIAGNOSIS REQUIRED)    Answer:   worsening dyspnea, increased o2 needs; metastatic lung cancer on chemo    Order Specific Question:   Is patient pregnant?    Answer:   No    Order Specific Question:   Preferred imaging location?    Answer:   [facility]    Order Specific Question:   Radiology Contrast Protocol - do NOT remove file path    Answer:   _0 [email or web address]   All questions were answered. The patient knows to call the clinic with any problems, questions or concerns. No barriers to learning was detected. I spent 20 minutes counseling the patient face to face. The total time spent in the appointment was 25 minutes and more than 50% was on counseling and review of test results     Alla Feeling, NP 01/04/19

## 2019-01-05 ENCOUNTER — Other Ambulatory Visit: Payer: Self-pay | Admitting: Nurse Practitioner

## 2019-01-05 ENCOUNTER — Telehealth: Payer: Self-pay | Admitting: Nurse Practitioner

## 2019-01-05 ENCOUNTER — Inpatient Hospital Stay (HOSPITAL_BASED_OUTPATIENT_CLINIC_OR_DEPARTMENT_OTHER): Payer: BC Managed Care – PPO | Admitting: Medical

## 2019-01-05 ENCOUNTER — Other Ambulatory Visit: Payer: Self-pay | Admitting: Medical

## 2019-01-05 DIAGNOSIS — J441 Chronic obstructive pulmonary disease with (acute) exacerbation: Secondary | ICD-10-CM | POA: Diagnosis not present

## 2019-01-05 DIAGNOSIS — J189 Pneumonia, unspecified organism: Secondary | ICD-10-CM | POA: Diagnosis not present

## 2019-01-05 DIAGNOSIS — J90 Pleural effusion, not elsewhere classified: Secondary | ICD-10-CM

## 2019-01-05 DIAGNOSIS — R7401 Elevation of levels of liver transaminase levels: Secondary | ICD-10-CM | POA: Diagnosis not present

## 2019-01-05 DIAGNOSIS — C787 Secondary malignant neoplasm of liver and intrahepatic bile duct: Secondary | ICD-10-CM | POA: Diagnosis not present

## 2019-01-05 DIAGNOSIS — Z66 Do not resuscitate: Secondary | ICD-10-CM | POA: Diagnosis not present

## 2019-01-05 DIAGNOSIS — C3431 Malignant neoplasm of lower lobe, right bronchus or lung: Secondary | ICD-10-CM | POA: Diagnosis not present

## 2019-01-05 DIAGNOSIS — I700 Atherosclerosis of aorta: Secondary | ICD-10-CM | POA: Diagnosis not present

## 2019-01-05 DIAGNOSIS — R110 Nausea: Secondary | ICD-10-CM | POA: Diagnosis not present

## 2019-01-05 DIAGNOSIS — R0600 Dyspnea, unspecified: Secondary | ICD-10-CM | POA: Diagnosis not present

## 2019-01-05 DIAGNOSIS — C7989 Secondary malignant neoplasm of other specified sites: Secondary | ICD-10-CM | POA: Diagnosis not present

## 2019-01-05 DIAGNOSIS — C3490 Malignant neoplasm of unspecified part of unspecified bronchus or lung: Secondary | ICD-10-CM

## 2019-01-05 DIAGNOSIS — R05 Cough: Secondary | ICD-10-CM | POA: Diagnosis not present

## 2019-01-05 DIAGNOSIS — J9601 Acute respiratory failure with hypoxia: Secondary | ICD-10-CM

## 2019-01-05 DIAGNOSIS — R222 Localized swelling, mass and lump, trunk: Secondary | ICD-10-CM | POA: Diagnosis not present

## 2019-01-05 DIAGNOSIS — Z5111 Encounter for antineoplastic chemotherapy: Secondary | ICD-10-CM | POA: Diagnosis not present

## 2019-01-05 DIAGNOSIS — J440 Chronic obstructive pulmonary disease with acute lower respiratory infection: Secondary | ICD-10-CM | POA: Diagnosis not present

## 2019-01-05 DIAGNOSIS — Z20828 Contact with and (suspected) exposure to other viral communicable diseases: Secondary | ICD-10-CM | POA: Diagnosis not present

## 2019-01-05 DIAGNOSIS — R519 Headache, unspecified: Secondary | ICD-10-CM | POA: Diagnosis not present

## 2019-01-05 LAB — SARS CORONAVIRUS 2 (TAT 6-24 HRS): SARS Coronavirus 2: NEGATIVE

## 2019-01-05 NOTE — Telephone Encounter (Signed)
No los per 10/15.

## 2019-01-05 NOTE — Telephone Encounter (Signed)
Refill request. Please note dosage. Is pt to take 5mg  or 10 mg?

## 2019-01-07 ENCOUNTER — Other Ambulatory Visit: Payer: Self-pay | Admitting: Nurse Practitioner

## 2019-01-07 DIAGNOSIS — J9601 Acute respiratory failure with hypoxia: Secondary | ICD-10-CM

## 2019-01-07 MED ORDER — PREDNISONE 10 MG PO TABS: ORAL_TABLET | ORAL | 3 refills | Status: DC

## 2019-01-08 ENCOUNTER — Ambulatory Visit (HOSPITAL_COMMUNITY)
Admission: RE | Admit: 2019-01-08 | Discharge: 2019-01-08 | Disposition: A | Discharge status: Home / Self Care | Payer: BC Managed Care – PPO | Source: Ambulatory Visit | Attending: Medical | Admitting: Medical

## 2019-01-08 ENCOUNTER — Ambulatory Visit (HOSPITAL_COMMUNITY)
Admission: RE | Admit: 2019-01-08 | Discharge: 2019-01-08 | Disposition: A | Discharge status: Home / Self Care | Payer: BC Managed Care – PPO | Source: Ambulatory Visit | Attending: Hematology | Admitting: Hematology

## 2019-01-08 ENCOUNTER — Other Ambulatory Visit: Payer: Self-pay

## 2019-01-08 ENCOUNTER — Ambulatory Visit (HOSPITAL_COMMUNITY)
Admission: RE | Admit: 2019-01-08 | Discharge: 2019-01-08 | Disposition: A | Discharge status: Home / Self Care | Payer: BC Managed Care – PPO | Source: Ambulatory Visit | Attending: Diagnostic Radiology | Admitting: Diagnostic Radiology

## 2019-01-08 ENCOUNTER — Encounter (HOSPITAL_COMMUNITY): Payer: Self-pay

## 2019-01-08 ENCOUNTER — Other Ambulatory Visit: Payer: Self-pay | Admitting: Hematology

## 2019-01-08 ENCOUNTER — Ambulatory Visit (HOSPITAL_COMMUNITY): Payer: BC Managed Care – PPO

## 2019-01-08 ENCOUNTER — Encounter: Payer: Self-pay | Admitting: Hematology

## 2019-01-08 DIAGNOSIS — C3490 Malignant neoplasm of unspecified part of unspecified bronchus or lung: Secondary | ICD-10-CM

## 2019-01-08 DIAGNOSIS — Z9889 Other specified postprocedural states: Secondary | ICD-10-CM

## 2019-01-08 DIAGNOSIS — J90 Pleural effusion, not elsewhere classified: Secondary | ICD-10-CM | POA: Insufficient documentation

## 2019-01-08 DIAGNOSIS — J948 Other specified pleural conditions: Secondary | ICD-10-CM | POA: Diagnosis not present

## 2019-01-08 DIAGNOSIS — R918 Other nonspecific abnormal finding of lung field: Secondary | ICD-10-CM | POA: Diagnosis not present

## 2019-01-08 DIAGNOSIS — C3491 Malignant neoplasm of unspecified part of right bronchus or lung: Secondary | ICD-10-CM | POA: Diagnosis not present

## 2019-01-08 MED ORDER — ONDANSETRON HCL 8 MG PO TABS: ORAL_TABLET | ORAL | 2 refills | Status: DC

## 2019-01-08 MED ORDER — IOHEXOL 300 MG/ML  SOLN
100.0000 mL | Freq: Once | INTRAMUSCULAR | Status: AC | PRN
  Administered 2019-01-08: 100 mL via INTRAVENOUS

## 2019-01-08 NOTE — Progress Notes (Signed)
These preliminary result these preliminary results were noted.  Awaiting final report.

## 2019-01-08 NOTE — Progress Notes (Signed)
Patient had x-ray completed yesterday that showed a moderate right pleural effusion.  She came to clinic today for collection of a COVID-19 test.  A thoracentesis is scheduled for her on Monday at [place], [facility], PA-C Physician Assistant

## 2019-01-08 NOTE — Procedures (Signed)
PreOperative Dx: RT pleural effusion, NSCLC Postoperative Dx: RT pleural effusion, NSCLC Procedure:   US guided RT thoracentesis Radiologist:  Thornton Papas Anesthesia:  10 ml of 1% lidocaine Specimen:  4 mL of clear yellow colored fluid EBL:   < 1 ml Complications: None

## 2019-01-09 LAB — CYTOLOGY - NON PAP

## 2019-01-09 NOTE — Progress Notes (Signed)
These preliminary result these preliminary results were noted.  Awaiting final report.

## 2019-01-10 ENCOUNTER — Telehealth: Payer: Self-pay | Admitting: Nurse Practitioner

## 2019-01-10 ENCOUNTER — Inpatient Hospital Stay (HOSPITAL_BASED_OUTPATIENT_CLINIC_OR_DEPARTMENT_OTHER): Payer: BC Managed Care – PPO | Admitting: Hematology

## 2019-01-10 ENCOUNTER — Encounter: Payer: Self-pay | Admitting: Hematology

## 2019-01-10 DIAGNOSIS — C3490 Malignant neoplasm of unspecified part of unspecified bronchus or lung: Secondary | ICD-10-CM

## 2019-01-10 NOTE — Progress Notes (Signed)
[facility]   [phone] Fax:(336) [phone]   Clinic Follow up Note   Patient Care Team: Redmond School, MD as PCP - General (Internal Medicine)   I connected with Yvonne Lang on 01/10/2019 at  1:45 PM EDT by telephone visit and verified that I am speaking with the correct person using two identifiers.  I discussed the limitations, risks, security and privacy concerns of performing an evaluation and management service by telephone and the availability of in person appointments. I also discussed with the patient that there may be a patient responsible charge related to this service. The patient expressed understanding and agreed to proceed.   Patient's location:  Her home  Provider's location:  My office  CHIEF COMPLAINT: F/u of Metastatic non-small cell lung cancer  SUMMARY OF ONCOLOGIC HISTORY: Oncology History Overview Note  Cancer Staging Metastatic non-small cell lung cancer ([place]) Staging form: Lung, AJCC 8th Edition - Clinical stage from 10/17/2017: Stage IV (cT3, cN1, cM1b) - Signed by Truitt Merle, MD on 10/17/2017     Metastatic non-small cell lung cancer ([place])  09/20/2017 Imaging   US Breast Left 09/20/17  IMPRESSION Indeterminte palpable mass measuring 3.2x1.7x2.8 cm  inferior medial to the inframammary fold of the left breast.     09/20/2017 Initial Biopsy   Diagnosis 09/20/17 Soft Tissue Needle Core Biopsy, inferior medial to IMF - ADENOCARCINOMA. - SEE MICROSCOPIC DESCRIPTION.  Microscopic Comment Immunohistochemistry will be performed and reported as an addendum. (JDP:ah 09/23/17) ADDENDUM: Immunohistochemistry shows the tumor is strongly positive with cytokeratin AE1/AE3, cytokeratin 7 and shows moderate weak positivity with CDX-2. The tumor is negative with estrogen receptor, progesterone receptor, GCDFP, GATA-3, Napsin A, TTF-1, WT-1 and cytokeratin 20. The immunophenotype is consistent with metastatic carcinoma. The combination of  CDX-2 and cytokeratin 7 positivity raises the possibility of an upper gastrointestinal primary. Clinical correlation is essential.   10/06/2017 Initial Diagnosis   Metastatic adenocarcinoma involving soft tissue with unknown primary site [facility])   10/10/2017 Procedure   Upper Endoscopy by Dr. Silverio Decamp 10/10/17  IMPRESSION - Normal esophagus. - Z-line regular, 35 cm from the incisors. - Gastric bypass with a normal-sized pouch and intact staple line. Gastrojejunal anastomosis characterized by healthy appearing mucosa. - No specimens collected.   10/12/2017 Imaging   CT CAP WO Contrast 10/12/17  IMPRESSION: 7 cm central right lung mass involving the right hilum, with postobstructive collapse of the right middle lobe, highly suspicious for primary bronchogenic carcinoma. 5 cm masslike opacity in the superior segment of the right lower lobe may represent carcinoma or postobstructive pneumonitis. 3.3 cm soft tissue mass in the lower anterior chest wall soft tissues, suspicious for metastatic disease. No evidence of abdominal or pelvic metastatic disease.    10/14/2017 Imaging   MRI Brain 10/14/17  IMPRESSION: 1. No metastatic disease or acute intracranial abnormality. Normal MRI appearance of the brain. 2. 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     10/17/2017 Cancer Staging   Staging form: Lung, AJCC 8th Edition - Clinical stage from 10/17/2017: Stage IV (cT3, cN1, cM1b) - Signed by Truitt Merle, MD on 10/17/2017   10/24/2017 - 11/04/2017 Radiation Therapy    The Right lung mass was treated to 30 Gy in 10 fractions of 3 Gy   11/09/2017 - 03/24/2018 Chemotherapy   -first line chemo with carboplatin, paclitaxel, Atezolizumab and bevacizumab every 3weeks starting 11/08/17.  -Switched to maintenance therapy with avastin and atezolizumab q3weeks on 03/24/18   01/10/2018 Imaging   01/10/2018 CT CA  IMPRESSION: 1. Response to therapy of central right lower lobe lung  mass. Re-expansion of the right middle lobe with significantly improved right lower lobe postobstructive pneumonitis. Residual geographic airspace and ground-glass opacity within the medial right lung, primarily favored to be radiation induced. 2. Near complete resolution of presternal soft tissue mass. 3. New right adrenal nodule since 10/11/2017, suspicious for metastatic disease. 4. Right-sided Port-A-Cath with probable nonocclusive thrombus along the catheter within the right brachiocephalic vein. 5. Age advanced coronary artery atherosclerosis. Recommend assessment of coronary risk factors and consideration of medical therapy. 6. Aortic atherosclerosis (ICD10-I70.0) and emphysema (ICD10-J43.9).   03/10/2018 Imaging   CT CAP W Contrast 03/10/18 IMPRESSION: 1. Radiation changes involving the right paramediastinal lung and right hilum but no findings suspicious for residual or recurrent tumor. 2. No evidence of pulmonary metastatic disease. Stable emphysematous changes and areas of pulmonary scarring. 3. Stable 11 mm right adrenal gland nodule. 4. No findings for metastatic disease involving the liver or bony structures.   03/24/2018 - 04/24/2018 Chemotherapy   The patient had bevacizumab (AVASTIN) 1,300 mg in sodium chloride 0.9 % 100 mL chemo infusion, 15.5 mg/kg = 1,275 mg, Intravenous,  Once, 1 of 4 cycles Administration: 1,300 mg (03/24/2018) atezolizumab (TECENTRIQ) 1,200 mg in sodium chloride 0.9 % 250 mL chemo infusion, 1,200 mg, Intravenous, Once, 1 of 4 cycles Administration: 1,200 mg (03/24/2018)  for chemotherapy treatment.    04/11/2018 Imaging   CT ANGIO CHEST PE W OR WO CONTRAST  IMPRESSION: 1. No evidence of acute pulmonary embolism. 2. Progressive radiation changes in the right perihilar region. Cavitary retro hilar mass and right paratracheal lymph node have enlarged, suspicious for local recurrence/disease progression. 3. Enlarging right adrenal nodule suspicious  for metastatic disease. 4. New right pleural effusion with increased atelectasis at both lung bases. 5. These results will be called to the ordering clinician or representative by the Radiology Department at the imaging location.    04/21/2018 Imaging   NUCLEAR MEDICINE PET SKULL BASE TO THIGH IMPRESSION: 1. Marked hypermetabolism in the amorphous soft tissue involving the right hilum. This represents the central component of the apparent radiation scarring. 2. Hypermetabolic mediastinal nodal metastases with nodal metastases identified in the anterior right juxta diaphragmatic fat. 3. Hypermetabolic right adrenal metastasis. 4. Interval progression of loculated right pleural effusion.    05/02/2018 - 07/25/2018 Chemotherapy   Second line Alimta 500mg /m2 every 3 weeks, started 05/02/2018. D/c due to disease pregression after 4 cycles     07/21/2018 Imaging   CT CAP 07/21/18  IMPRESSION: 1. Enlarging right adrenal metastatic lesion a mildly enlarging mediastinal adenopathy compatible with mild progression of disease. No new metastatic foci are identified. 2. Evolutionary findings along the right perihilar/paramediastinal radiation fibrosis including some improvement in aeration in the right upper lobe, but enlargement of the cavitary/centrally necrotic region in the superior segment right lower lobe, which is gas-filled and which probably connects to the otherwise focally occluded bronchus intermedius. 3. Other imaging findings of potential clinical significance: Probable left anterior descending coronary atherosclerosis. Mild to moderate intrahepatic biliary dilatation mild extrahepatic biliary dilatation, much of which may be a physiologic response to cholecystectomy. Prominent stool throughout the colon favors constipation. Aortic Atherosclerosis (ICD10-I70.0).   07/26/2018 - 08/17/2018 Chemotherapy   Third-line docetaxel and cyramza q3 weeks on 07/26/18. D/c after cycle 2 due to PNA  and mild disease progression   09/07/2018 Imaging   CT Angio Chest 09/07/18  IMPRESSION: 1. Detection of pulmonary emboli is severely limited by motion artifact  and suboptimal contrast bolus timing. Given this limitation, no PE was identified. The pulmonary arteries are dilated which can be seen in patients with elevated pulmonary artery pressures. 2. Diffuse ground-glass airspace opacities involving the right upper lobe, left upper lobe, and left lower lobe. Findings are concerning for multifocal pneumonia (viral or bacterial). 3. Persistent ill-defined soft tissue in the right perihilar region with multiple pathologically enlarged mediastinal and hilar lymph nodes. There is a growing air-filled cavitary area within the right perihilar region which may be related to post treatment changes. 4. Small right-sided pleural effusion. 5. Growing subcutaneous soft tissue nodule in the anterior midline left chest wall as detailed above. This could represent a metastatic deposit. Again noted is a right adrenal metastatic lesion as detailed above.   Aortic Atherosclerosis (ICD10-I70.0).   09/07/2018 - 09/14/2018 Hospital Admission   Admit date: 09/07/18 Admission diagnosis: COPD exacerbation, PNA Additional comments: After 3 cycles of third-line chemo she was admitted for COPD and PNA. She was placed on oxygen and treated with antibotics.    10/05/2018 -  Chemotherapy   Fourth-line Weekly Gemcitabine starting 10/05/18   10/11/2018 Imaging   CT AP W Contrast  IMPRESSION: 1. Enlarging right adrenal gland metastatic lesion. 2. Enlarging subcutaneous soft tissue lesions in the lower anterior chest wall and the anterior abdominal wall. 3. New soft tissue lesion noted in the lower aspect of the right ischial rectal fossa. 4. Stable intra and extrahepatic biliary dilatation. No findings for hepatic metastatic disease. 5. No acute abdominal/pelvic findings or lymphadenopathy. 6. No definite bone  lesions are identified. Specifically I do not see a definite cause for the patient's right hip pain. MRI would be suggested for further evaluation.   11/13/2018 Imaging   CT CAP IMPRESSION: 1. Mild mixed interval changes. Mild right paratracheal adenopathy and small soft tissue metastasis in the supraumbilical right abdominal wall have mildly decreased. Right perihilar soft tissue, ventral left chest wall soft tissue metastasis and right adrenal metastasis have slightly increased. New 5 mm apical left upper lobe pulmonary nodule, metastasis not excluded. 2. Small right pleural effusion, slightly increased. 3.  Aortic Atherosclerosis (ICD10-I70.0).   01/08/2019 Procedure   US Thoracentesis 01/08/19  Limited ultrasound guided RIGHT thoracentesis yielding 4 mL of pleural fluid as discussed above. CYROLOGY WAS BENIGN   01/08/2019 Imaging   CT AP W Contrast 01/08/19  IMPRESSION: 1. New right pleural effusion with complete volume loss of the right middle lobe and right lower lobe. 2. Stable to slight decrease in size of left adrenal gland metastasis. 3. Enlarging soft tissue nodule within the right inguinal region concerning for metastasis. Additional subcutaneous soft tissue nodules within the ventral chest wall, abdominal wall and right ischial anal fossa are stable.   Aortic Atherosclerosis (ICD10-I70.0).      CURRENT THERAPY:  Fourth-line Gemcitabine2 weeks on/1 week offstarting7/16/20  INTERVAL HISTORY:  Jamey Demchak is here for a follow up. She notes since her Thoracentesis, her breathing has not really improved. She only had 58ml removed/ She notes IR had issues removing the fluid. She notes she can walk 10-20 feet without stopping. She feels she cannot really do physical housework. She notes she is on Predisone 10 mg currently.   REVIEW OF SYSTEMS:   Constitutional: Denies fevers, chills or abnormal weight loss Eyes: Denies blurriness of vision Ears, nose, mouth,  throat, and face: Denies mucositis or sore throat Respiratory: (+) Dyspenia and wheezing  Cardiovascular: Denies palpitation, chest discomfort or lower extremity swelling Gastrointestinal:  Denies nausea,  heartburn or change in bowel habits Skin: Denies abnormal skin rashes Lymphatics: Denies new lymphadenopathy or easy bruising Neurological:Denies numbness, tingling or new weaknesses Behavioral/Psych: Mood is stable, no new changes  All other systems were reviewed with the patient and are negative.  MEDICAL HISTORY:  Past Medical History:  Diagnosis Date   Anxiety    Bronchitis    COPD (chronic obstructive pulmonary disease) (HCC)    Depression    GERD (gastroesophageal reflux disease)    H/O gastric bypass    1999   Irritable bowel syndrome    Lung cancer ([place])     SURGICAL HISTORY: Past Surgical History:  Procedure Laterality Date   BACK SURGERY     CHOLECYSTECTOMY     COLON SURGERY     for a kink in her colon and 12 inchs removed   COLONOSCOPY N/A 12/13/2013   Procedure: COLONOSCOPY;  Surgeon: Rogene Houston, MD;  [facility];  Service: Endoscopy;  Laterality: N/A;  200   ESOPHAGOGASTRODUODENOSCOPY N/A 12/13/2013   Procedure: ESOPHAGOGASTRODUODENOSCOPY (EGD);  Surgeon: Rogene Houston, MD;  Location: [facility];  Service: Endoscopy;  Laterality: N/A;   Gastric Bypas  2000   IR IMAGING GUIDED PORT INSERTION  11/14/2017    I have reviewed the social history and family history with the patient and they are unchanged from previous note.  ALLERGIES:  is allergic to codeine.  MEDICATIONS:  Current Outpatient Medications  Medication Sig Dispense Refill   albuterol (VENTOLIN HFA) 108 (90 Base) MCG/ACT inhaler INHALE 2 PUFFS INTO THE LUNGS TWICE DAILY 8.5 g 1   ALPRAZolam (XANAX) 0.25 MG tablet Take 1 tablet (0.25 mg total) by mouth at bedtime as needed for anxiety or sleep. 30 tablet 0   butalbital-acetaminophen-caffeine (FIORICET, ESGIC)  50-325-40 MG tablet Take 1-2 tablets by mouth every 6 (six) hours as needed for headache. 60 tablet 1   CALCIUM-MAGNESIUM-ZINC PO Take 1 tablet by mouth daily.     dexamethasone (DECADRON) 4 MG tablet Take 2 tablets (8 mg total) by mouth 2 (two) times daily. Start the day before Taxotere, then take on day after chemo for 2 days 30 tablet 1   dicyclomine (BENTYL) 20 MG tablet Take 1 tablet (20 mg total) by mouth every 6 (six) hours. 60 tablet 1   FLUoxetine (PROZAC) 40 MG capsule Take 80 mg by mouth daily.     furosemide (LASIX) 20 MG tablet Take 1 tablet (20 mg total) by mouth daily as needed. (Patient taking differently: Take 20 mg by mouth daily as needed for fluid or edema. ) 30 tablet 0   gabapentin (NEURONTIN) 300 MG capsule Take 300 mg by mouth daily as needed (for pain).   4   guaiFENesin (MUCINEX) 600 MG 12 hr tablet Take 1 tablet (600 mg total) by mouth 2 (two) times daily. 60 tablet 0   HYDROcodone-acetaminophen (NORCO) 10-325 MG tablet Take 2 tablets by mouth every 6 (six) hours as needed (For pain.).      ipratropium-albuterol (DUONEB) 0.5-2.5 (3) MG/3ML SOLN Take 3 mLs by nebulization every 4 (four) hours as needed. (Patient taking differently: Take 3 mLs by nebulization every 4 (four) hours as needed (for shortness of breath). ) 360 mL 1   lidocaine-prilocaine (EMLA) cream APPLY 1 APPLICATION TO PORT TOPICALLY AS NEEDED ONE HOUR PRIOR TO PORT ACCESS/CHEMO (Patient taking differently: Apply 1 application topically as needed (For port-a-cath.). ) 30 g 0   magic mouthwash w/lidocaine SOLN Take 5 mLs by mouth  3 (three) times daily as needed for mouth pain. 240 mL 0   Multiple Vitamin (MULTIVITAMIN) tablet Take 1 tablet by mouth daily.     nystatin (MYCOSTATIN) 100000 UNIT/ML suspension SWISH ORALLY THEN SPIT WITH 5 ML THREE TIMES DAILY (Patient taking differently: Use as directed 5 mLs in the mouth or throat 3 (three) times daily. ) 240 mL 0   omeprazole (PRILOSEC) 20 MG capsule  Take 1 capsule (20 mg total) by mouth 2 (two) times daily before a meal. 60 capsule 0   ondansetron (ZOFRAN) 8 MG tablet Take one tablet every 8 hours as needed for nause 30 tablet 2   oxyCODONE (OXYCONTIN) 20 mg 12 hr tablet Take 1 tablet (20 mg total) by mouth every 12 (twelve) hours. 60 tablet 0   potassium chloride SA (K-DUR) 20 MEQ tablet Take 1 tablet by mouth once daily 30 tablet 0   predniSONE (DELTASONE) 10 MG tablet Take 1 tablets daily 30 tablet 3   prochlorperazine (COMPAZINE) 10 MG tablet TAKE 1 TABLET BY MOUTH EVERY 6 HOURS AS NEEDED FOR NAUSEA FOR VOMITING 30 tablet 0   rOPINIRole (REQUIP) 0.5 MG tablet Take 0.5 mg by mouth at bedtime as needed (For restless legs.).   11   sucralfate (CARAFATE) 1 g tablet TAKE 1 TABLET(1 GRAM) BY MOUTH FOUR TIMES DAILY 120 tablet 0   TRELEGY ELLIPTA 100-62.5-25 MCG/INH AEPB Inhale 1 puff into the lungs daily.   5   zolpidem (AMBIEN) 10 MG tablet Take 10 mg by mouth at bedtime.      No current facility-administered medications for this visit.     PHYSICAL EXAMINATION: ECOG PERFORMANCE STATUS: 3 - Symptomatic, >50% confined to bed  No vitals taken today, Exam not performed today   LABORATORY DATA:  I have reviewed the data as listed CBC Latest Ref Rng & Units 01/04/2019 12/28/2018 12/14/2018  WBC 4.0 - 10.5 K/uL 7.8 9.2 5.8  Hemoglobin 12.0 - 15.0 g/dL 9.9(L) 9.4(L) 9.9(L)  Hematocrit 36.0 - 46.0 % 31.5(L) 30.5(L) 31.5(L)  Platelets 150 - 400 K/uL 462(H) 551(H) 336     CMP Latest Ref Rng & Units 01/04/2019 12/28/2018 12/14/2018  Glucose 70 - 99 mg/dL 111(H) 102(H) 109(H)  BUN 6 - 20 mg/dL <4(L) 9 4(L)  Creatinine 0.44 - 1.00 mg/dL 0.66 0.61 0.66  Sodium 135 - 145 mmol/L 135 142 139  Potassium 3.5 - 5.1 mmol/L 4.1 3.9 5.0  Chloride 98 - 111 mmol/L 99 105 104  CO2 22 - 32 mmol/L 29 30 29   Calcium 8.9 - 10.3 mg/dL 8.2(L) 7.6(L) 8.0(L)  Total Protein 6.5 - 8.1 g/dL 5.4(L) 5.1(L) 5.5(L)  Total Bilirubin 0.3 - 1.2 mg/dL 0.5 0.2(L)  0.3  Alkaline Phos 38 - 126 U/L 351(H) 246(H) 357(H)  AST 15 - 41 U/L 236(HH) 26 113(H)  ALT 0 - 44 U/L 65(H) 20 49(H)      RADIOGRAPHIC STUDIES: I have personally reviewed the radiological images as listed and agreed with the findings in the report. No results found.   ASSESSMENT & PLAN:  Yvonne Lang is a 53 y.o. female with   1. 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 -Diagnosed in 09/2017. Treated with chemo and radiation. -she has progressed through multiple line chemo, including immunotherapy -I started her on fourth line Gemcitabine2 weeks on/1 week offon 10/05/18.She tolerating moderately wellwith mild nausea, manageable. -I personally reviewed and discussed her CT AP from 01/08/19 which showed new right pleural effusion, stable to slight decrease  in size of left adrenal glans metastasis and enlarging soft tissue nodule within the right inguinal region concenring for metastasis. Her chest nodule is stable. Cytology from thoracentesis was negative for malignant cells  -I discussed Gemcitabine is moderately controlling her disease, but I am concerned about her recurrent pleural effusion which is effected her breathing and her new inguinal soft tissue mass.  -I looked into clinical research trail but she is not eligible.  -I discussed our limited treatment options including adding Immunotherapy Nivolumab to her Gemcitabine or Nivo alone. However her PS has declined lately due to her dyspnea.  Will only offer Nivo if her breathing improves and she is able to come to clinic. She understands and agrees.  -I discussed as her cancer worsens and she may not be able to tolerate more treatment, I would recommend home hospice if she is not able to conitnue on systemic treamtent. She understands and will discuss this with her family. Hospice has been reviewed with her before, and she is open to it.  -F/u in 2 weeks   2.Dyspnea and dry cough  secondary to her COPD,right lung cancer and radiation changes, Pleural Effusion  -Overall stable, she will continuebronchodilator -In 08/2017 she had recent COPD exacerbation andPneumoniainfection. She was treated inpatient with oxygen, antibiotics and steroids. -She was discharged on 3L oxygen [place] and 40mg  prednisone daily and breathing treatment TID.  -Starting 09/22/18, I will have her reduce prednisone to 30mg  and continue to very slowly ween her off.  -She still has Dyspnea upon mild activities. Dry cough persists. -She has struggled to wean off prednisone, will continue 5mg  dose.She was able to reduce oxygen to 2L. -Breathing has been stable on supplemental oxygen and 5mg  prednisone.  -for her recent left sided chest pain from mild progression she will continue to Hydrocodone she is on. -Her wheezing has worsened recently.I encouraged her toincrease nebulizer breathing treatment at least twice a day and increase her prednisone to 10mg  daily on 12/14/18.  -As seen on her 01/08/19 CT Scan she has recurrent right peural effusion. She underwent thoracentesis on 01/08/19 but only 73ml was removed. Cytology was benignI will contact IR about another thoracentesis to help her breathing.  -She has been having worsening Dyspnia and Wheezing and can only walk 10-20 feet and not able to do much housework.  -To help her breathing, will increase her Prednisone to 40mg  daily for 3 days and then every 2 days she will reduce her prednisone by 1 tab to taper back down to 10mg  if tolerable. I discussed high dose prednisone can cause side effects as well, will monitor.  -She has increased Oxygen to 4 L [place]. Will continue Nebulizer treatment as well.  -I spoke with IR Dr. Francena Hanly and will give one more try of thoracentesis   3.Goal of care discussion, DNR/DNI -The patient understands the goal of care is palliative. -Wepreviouslydiscussed overall poor prognosis due to the disease progression,she has  had multiple lines ofchemo, the response from future treatment will be low -We discussed DNR/DNIwhen she was recently in hospital, and she agreed DNR/DNI  Plan -Increase Prednisone to 40mg  daily for at least 3 days, then gradually decrease to 10mg  if she can tolerate  -CT AP reviewed, mild disease progression  -phone visit next week  -Will contact IR about another Thoracentesis   No problem-specific Assessment & Plan notes found for this encounter.   No orders of the defined types were placed in this encounter.  I discussed the assessment and  treatment plan with the patient. The patient was provided an opportunity to ask questions and all were answered. The patient agreed with the plan and demonstrated an understanding of the instructions.  The patient was advised to call back or seek an in-person evaluation if the symptoms worsen or if the condition fails to improve as anticipated.  I provided 22 minutes of non face-to-face telephone visit time during this encounter, and > 50% was spent counseling as documented under my assessment & plan.    Truitt Merle, MD 01/10/2019   I, Joslyn Devon, am acting as scribe for Truitt Merle, MD.   I have reviewed the above documentation for accuracy and completeness, and I agree with the above.

## 2019-01-10 NOTE — Telephone Encounter (Signed)
I called Yvonne Lang early this morning to let her know briefly that her scan is overall stable. I woke her up with my call and could hear her wheezing on the phone but she is otherwise doing OK. She notes thoracentesis only drained small amount of fluid and her breathing has not much improved. Her phone visit is scheduled for later today, but will require treatment discussion for future options and therefore was decided to move her to Dr. Ernestina Penna schedule. MD will call her later today to review CT in more detail and discuss treatment. Yvonne Lang understands and appreciated the call.  Yvonne Rue, NP 01/10/19

## 2019-01-11 ENCOUNTER — Telehealth: Payer: Self-pay | Admitting: Hematology

## 2019-01-11 ENCOUNTER — Telehealth: Payer: Self-pay

## 2019-01-11 ENCOUNTER — Encounter: Payer: Self-pay | Admitting: Hematology

## 2019-01-11 ENCOUNTER — Other Ambulatory Visit: Payer: Self-pay

## 2019-01-11 DIAGNOSIS — C3490 Malignant neoplasm of unspecified part of unspecified bronchus or lung: Secondary | ICD-10-CM

## 2019-01-11 NOTE — Telephone Encounter (Signed)
Let voice message for patient per Dr. Burr Medico  1) Would she like to try another thoracentesis next week?  And   2) Does she want to come in as scheduled on 10/29 or would she prefer a phone visit?  I asked that the patient call back and leave a voice message with her preferences.

## 2019-01-11 NOTE — Telephone Encounter (Signed)
Scheduled appt per 10/21 los.  Spoke with pt and she is aware of the appt date and time.

## 2019-01-12 ENCOUNTER — Other Ambulatory Visit: Payer: Self-pay | Admitting: Hematology

## 2019-01-13 ENCOUNTER — Other Ambulatory Visit (HOSPITAL_COMMUNITY)
Admission: RE | Admit: 2019-01-13 | Discharge: 2019-01-13 | Disposition: A | Discharge status: Home / Self Care | Payer: BC Managed Care – PPO | Source: Ambulatory Visit | Attending: Hematology | Admitting: Hematology

## 2019-01-13 DIAGNOSIS — Z20828 Contact with and (suspected) exposure to other viral communicable diseases: Secondary | ICD-10-CM | POA: Insufficient documentation

## 2019-01-13 DIAGNOSIS — Z01812 Encounter for preprocedural laboratory examination: Secondary | ICD-10-CM | POA: Insufficient documentation

## 2019-01-14 ENCOUNTER — Encounter: Payer: Self-pay | Admitting: Hematology

## 2019-01-14 DIAGNOSIS — C3490 Malignant neoplasm of unspecified part of unspecified bronchus or lung: Secondary | ICD-10-CM | POA: Diagnosis not present

## 2019-01-14 LAB — NOVEL CORONAVIRUS, NAA (HOSP ORDER, SEND-OUT TO REF LAB; TAT 18-24 HRS): SARS-CoV-2, NAA: NOT DETECTED

## 2019-01-15 ENCOUNTER — Telehealth: Payer: Self-pay

## 2019-01-15 NOTE — Telephone Encounter (Signed)
Spoke with patient to make sure she knows her thoracentesis appointment did get moved up to tomorrow at 11:00 at [facility].  She was aware.  She states the cough she is having is still non-productive, no fever.  Explained we would see if they can draw any fluid off tomorrow first and go from there.  Cira Rue NP will not be sending in an antibiotic at this time.  She verbalized an understanding.

## 2019-01-16 ENCOUNTER — Ambulatory Visit (HOSPITAL_COMMUNITY)
Admission: RE | Admit: 2019-01-16 | Discharge: 2019-01-16 | Disposition: A | Discharge status: Home / Self Care | Payer: BC Managed Care – PPO | Source: Ambulatory Visit | Attending: Hematology | Admitting: Hematology

## 2019-01-16 ENCOUNTER — Ambulatory Visit (HOSPITAL_COMMUNITY)
Admission: RE | Admit: 2019-01-16 | Discharge: 2019-01-16 | Disposition: A | Discharge status: Home / Self Care | Payer: BC Managed Care – PPO | Source: Ambulatory Visit | Attending: Radiology | Admitting: Radiology

## 2019-01-16 ENCOUNTER — Inpatient Hospital Stay (HOSPITAL_BASED_OUTPATIENT_CLINIC_OR_DEPARTMENT_OTHER): Payer: BC Managed Care – PPO | Admitting: Nurse Practitioner

## 2019-01-16 ENCOUNTER — Other Ambulatory Visit: Payer: Self-pay

## 2019-01-16 ENCOUNTER — Encounter: Payer: Self-pay | Admitting: Nurse Practitioner

## 2019-01-16 VITALS — BP 134/91 | HR 93 | Temp 98.5°F | Resp 20 | Ht 67.0 in | Wt 189.7 lb

## 2019-01-16 DIAGNOSIS — J441 Chronic obstructive pulmonary disease with (acute) exacerbation: Secondary | ICD-10-CM | POA: Diagnosis not present

## 2019-01-16 DIAGNOSIS — R0600 Dyspnea, unspecified: Secondary | ICD-10-CM | POA: Diagnosis not present

## 2019-01-16 DIAGNOSIS — Z5111 Encounter for antineoplastic chemotherapy: Secondary | ICD-10-CM | POA: Diagnosis not present

## 2019-01-16 DIAGNOSIS — J189 Pneumonia, unspecified organism: Secondary | ICD-10-CM | POA: Diagnosis not present

## 2019-01-16 DIAGNOSIS — C3431 Malignant neoplasm of lower lobe, right bronchus or lung: Secondary | ICD-10-CM | POA: Diagnosis not present

## 2019-01-16 DIAGNOSIS — Z20828 Contact with and (suspected) exposure to other viral communicable diseases: Secondary | ICD-10-CM | POA: Diagnosis not present

## 2019-01-16 DIAGNOSIS — J90 Pleural effusion, not elsewhere classified: Secondary | ICD-10-CM | POA: Insufficient documentation

## 2019-01-16 DIAGNOSIS — R110 Nausea: Secondary | ICD-10-CM | POA: Diagnosis not present

## 2019-01-16 DIAGNOSIS — C3490 Malignant neoplasm of unspecified part of unspecified bronchus or lung: Secondary | ICD-10-CM | POA: Diagnosis not present

## 2019-01-16 DIAGNOSIS — R7401 Elevation of levels of liver transaminase levels: Secondary | ICD-10-CM | POA: Diagnosis not present

## 2019-01-16 DIAGNOSIS — J440 Chronic obstructive pulmonary disease with acute lower respiratory infection: Secondary | ICD-10-CM | POA: Diagnosis not present

## 2019-01-16 DIAGNOSIS — R222 Localized swelling, mass and lump, trunk: Secondary | ICD-10-CM | POA: Diagnosis not present

## 2019-01-16 DIAGNOSIS — I700 Atherosclerosis of aorta: Secondary | ICD-10-CM | POA: Diagnosis not present

## 2019-01-16 DIAGNOSIS — R519 Headache, unspecified: Secondary | ICD-10-CM | POA: Diagnosis not present

## 2019-01-16 DIAGNOSIS — J948 Other specified pleural conditions: Secondary | ICD-10-CM | POA: Diagnosis not present

## 2019-01-16 DIAGNOSIS — Z9889 Other specified postprocedural states: Secondary | ICD-10-CM | POA: Insufficient documentation

## 2019-01-16 DIAGNOSIS — C787 Secondary malignant neoplasm of liver and intrahepatic bile duct: Secondary | ICD-10-CM | POA: Diagnosis not present

## 2019-01-16 DIAGNOSIS — C7989 Secondary malignant neoplasm of other specified sites: Secondary | ICD-10-CM | POA: Diagnosis not present

## 2019-01-16 DIAGNOSIS — R05 Cough: Secondary | ICD-10-CM | POA: Diagnosis not present

## 2019-01-16 DIAGNOSIS — Z66 Do not resuscitate: Secondary | ICD-10-CM | POA: Diagnosis not present

## 2019-01-16 MED ORDER — LIDOCAINE HCL 1 % IJ SOLN
INTRAMUSCULAR | Status: AC
  Filled 2019-01-16: qty 20

## 2019-01-16 NOTE — Progress Notes (Signed)
[place]   [phone]) [phone] Fax:(336) [phone]   Clinic Follow up Note   Patient Care Team: Redmond School, MD as PCP - General (Internal Medicine) 01/16/2019  CHIEF COMPLAINT: F/u dyspnea, cough  SUMMARY OF ONCOLOGIC HISTORY: Oncology History Overview Note  Cancer Staging Metastatic non-small cell lung cancer [facility]) Staging form: Lung, AJCC 8th Edition - Clinical stage from 10/17/2017: Stage IV (cT3, cN1, cM1b) - Signed by Truitt Merle, MD on 10/17/2017     Metastatic non-small cell lung cancer ([place])  09/20/2017 Imaging   US Breast Left 09/20/17  IMPRESSION Indeterminte palpable mass measuring 3.2x1.7x2.8 cm  inferior medial to the inframammary fold of the left breast.     09/20/2017 Initial Biopsy   Diagnosis 09/20/17 Soft Tissue Needle Core Biopsy, inferior medial to IMF - ADENOCARCINOMA. - SEE MICROSCOPIC DESCRIPTION.  Microscopic Comment Immunohistochemistry will be performed and reported as an addendum. (JDP:ah 09/23/17) ADDENDUM: Immunohistochemistry shows the tumor is strongly positive with cytokeratin AE1/AE3, cytokeratin 7 and shows moderate weak positivity with CDX-2. The tumor is negative with estrogen receptor, progesterone receptor, GCDFP, GATA-3, Napsin A, TTF-1, WT-1 and cytokeratin 20. The immunophenotype is consistent with metastatic carcinoma. The combination of CDX-2 and cytokeratin 7 positivity raises the possibility of an upper gastrointestinal primary. Clinical correlation is essential.   10/06/2017 Initial Diagnosis   Metastatic adenocarcinoma involving soft tissue with unknown primary site [facility])   10/10/2017 Procedure   Upper Endoscopy by Dr. Silverio Decamp 10/10/17  IMPRESSION - Normal esophagus. - Z-line regular, 35 cm from the incisors. - Gastric bypass with a normal-sized pouch and intact staple line. Gastrojejunal anastomosis characterized by healthy appearing mucosa. - No specimens collected.   10/12/2017 Imaging   CT CAP WO  Contrast 10/12/17  IMPRESSION: 7 cm central right lung mass involving the right hilum, with postobstructive collapse of the right middle lobe, highly suspicious for primary bronchogenic carcinoma. 5 cm masslike opacity in the superior segment of the right lower lobe may represent carcinoma or postobstructive pneumonitis. 3.3 cm soft tissue mass in the lower anterior chest wall soft tissues, suspicious for metastatic disease. No evidence of abdominal or pelvic metastatic disease.    10/14/2017 Imaging   MRI Brain 10/14/17  IMPRESSION: 1. No metastatic disease or acute intracranial abnormality. Normal MRI appearance of the brain. 2. 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     10/17/2017 Cancer Staging   Staging form: Lung, AJCC 8th Edition - Clinical stage from 10/17/2017: Stage IV (cT3, cN1, cM1b) - Signed by Truitt Merle, MD on 10/17/2017   10/24/2017 - 11/04/2017 Radiation Therapy    The Right lung mass was treated to 30 Gy in 10 fractions of 3 Gy   11/09/2017 - 03/24/2018 Chemotherapy   -first line chemo with carboplatin, paclitaxel, Atezolizumab and bevacizumab every 3weeks starting 11/08/17.  -Switched to maintenance therapy with avastin and atezolizumab q3weeks on 03/24/18   01/10/2018 Imaging   01/10/2018 CT CA IMPRESSION: 1. Response to therapy of central right lower lobe lung mass. Re-expansion of the right middle lobe with significantly improved right lower lobe postobstructive pneumonitis. Residual geographic airspace and ground-glass opacity within the medial right lung, primarily favored to be radiation induced. 2. Near complete resolution of presternal soft tissue mass. 3. New right adrenal nodule since 10/11/2017, suspicious for metastatic disease. 4. Right-sided Port-A-Cath with probable nonocclusive thrombus along the catheter within the right brachiocephalic vein. 5. Age advanced coronary artery atherosclerosis. Recommend assessment of coronary risk  factors and consideration of medical therapy. 6. Aortic  atherosclerosis (ICD10-I70.0) and emphysema (ICD10-J43.9).   03/10/2018 Imaging   CT CAP W Contrast 03/10/18 IMPRESSION: 1. Radiation changes involving the right paramediastinal lung and right hilum but no findings suspicious for residual or recurrent tumor. 2. No evidence of pulmonary metastatic disease. Stable emphysematous changes and areas of pulmonary scarring. 3. Stable 11 mm right adrenal gland nodule. 4. No findings for metastatic disease involving the liver or bony structures.   03/24/2018 - 04/24/2018 Chemotherapy   The patient had bevacizumab (AVASTIN) 1,300 mg in sodium chloride 0.9 % 100 mL chemo infusion, 15.5 mg/kg = 1,275 mg, Intravenous,  Once, 1 of 4 cycles Administration: 1,300 mg (03/24/2018) atezolizumab (TECENTRIQ) 1,200 mg in sodium chloride 0.9 % 250 mL chemo infusion, 1,200 mg, Intravenous, Once, 1 of 4 cycles Administration: 1,200 mg (03/24/2018)  for chemotherapy treatment.    04/11/2018 Imaging   CT ANGIO CHEST PE W OR WO CONTRAST  IMPRESSION: 1. No evidence of acute pulmonary embolism. 2. Progressive radiation changes in the right perihilar region. Cavitary retro hilar mass and right paratracheal lymph node have enlarged, suspicious for local recurrence/disease progression. 3. Enlarging right adrenal nodule suspicious for metastatic disease. 4. New right pleural effusion with increased atelectasis at both lung bases. 5. These results will be called to the ordering clinician or representative by the Radiology Department at the imaging location.    04/21/2018 Imaging   NUCLEAR MEDICINE PET SKULL BASE TO THIGH IMPRESSION: 1. Marked hypermetabolism in the amorphous soft tissue involving the right hilum. This represents the central component of the apparent radiation scarring. 2. Hypermetabolic mediastinal nodal metastases with nodal metastases identified in the anterior right juxta diaphragmatic  fat. 3. Hypermetabolic right adrenal metastasis. 4. Interval progression of loculated right pleural effusion.    05/02/2018 - 07/25/2018 Chemotherapy   Second line Alimta 533m/m2 every 3 weeks, started 05/02/2018. D/c due to disease pregression after 4 cycles     07/21/2018 Imaging   CT CAP 07/21/18  IMPRESSION: 1. Enlarging right adrenal metastatic lesion a mildly enlarging mediastinal adenopathy compatible with mild progression of disease. No new metastatic foci are identified. 2. Evolutionary findings along the right perihilar/paramediastinal radiation fibrosis including some improvement in aeration in the right upper lobe, but enlargement of the cavitary/centrally necrotic region in the superior segment right lower lobe, which is gas-filled and which probably connects to the otherwise focally occluded bronchus intermedius. 3. Other imaging findings of potential clinical significance: Probable left anterior descending coronary atherosclerosis. Mild to moderate intrahepatic biliary dilatation mild extrahepatic biliary dilatation, much of which may be a physiologic response to cholecystectomy. Prominent stool throughout the colon favors constipation. Aortic Atherosclerosis (ICD10-I70.0).   07/26/2018 - 08/17/2018 Chemotherapy   Third-line docetaxel and cyramza q3 weeks on 07/26/18. D/c after cycle 2 due to PNA and mild disease progression   09/07/2018 Imaging   CT Angio Chest 09/07/18  IMPRESSION: 1. Detection of pulmonary emboli is severely limited by motion artifact and suboptimal contrast bolus timing. Given this limitation, no PE was identified. The pulmonary arteries are dilated which can be seen in patients with elevated pulmonary artery pressures. 2. Diffuse ground-glass airspace opacities involving the right upper lobe, left upper lobe, and left lower lobe. Findings are concerning for multifocal pneumonia (viral or bacterial). 3. Persistent ill-defined soft tissue in the right  perihilar region with multiple pathologically enlarged mediastinal and hilar lymph nodes. There is a growing air-filled cavitary area within the right perihilar region which may be related to post treatment changes. 4. Small right-sided pleural effusion.  5. Growing subcutaneous soft tissue nodule in the anterior midline left chest wall as detailed above. This could represent a metastatic deposit. Again noted is a right adrenal metastatic lesion as detailed above.   Aortic Atherosclerosis (ICD10-I70.0).   09/07/2018 - 09/14/2018 Hospital Admission   Admit date: 09/07/18 Admission diagnosis: COPD exacerbation, PNA Additional comments: After 3 cycles of third-line chemo she was admitted for COPD and PNA. She was placed on oxygen and treated with antibotics.    10/05/2018 -  Chemotherapy   Fourth-line Weekly Gemcitabine starting 10/05/18   10/11/2018 Imaging   CT AP W Contrast  IMPRESSION: 1. Enlarging right adrenal gland metastatic lesion. 2. Enlarging subcutaneous soft tissue lesions in the lower anterior chest wall and the anterior abdominal wall. 3. New soft tissue lesion noted in the lower aspect of the right ischial rectal fossa. 4. Stable intra and extrahepatic biliary dilatation. No findings for hepatic metastatic disease. 5. No acute abdominal/pelvic findings or lymphadenopathy. 6. No definite bone lesions are identified. Specifically I do not see a definite cause for the patient's right hip pain. MRI would be suggested for further evaluation.   11/13/2018 Imaging   CT CAP IMPRESSION: 1. Mild mixed interval changes. Mild right paratracheal adenopathy and small soft tissue metastasis in the supraumbilical right abdominal wall have mildly decreased. Right perihilar soft tissue, ventral left chest wall soft tissue metastasis and right adrenal metastasis have slightly increased. New 5 mm apical left upper lobe pulmonary nodule, metastasis not excluded. 2. Small right pleural effusion,  slightly increased. 3.  Aortic Atherosclerosis (ICD10-I70.0).   01/08/2019 Procedure   US Thoracentesis 01/08/19  Limited ultrasound guided RIGHT thoracentesis yielding 4 mL of pleural fluid as discussed above. CYROLOGY WAS BENIGN   01/08/2019 Imaging   CT AP W Contrast 01/08/19  IMPRESSION: 1. New right pleural effusion with complete volume loss of the right middle lobe and right lower lobe. 2. Stable to slight decrease in size of left adrenal gland metastasis. 3. Enlarging soft tissue nodule within the right inguinal region concerning for metastasis. Additional subcutaneous soft tissue nodules within the ventral chest wall, abdominal wall and right ischial anal fossa are stable.   Aortic Atherosclerosis (ICD10-I70.0).     CURRENT THERAPY: Fourth-line Gemcitabine2 weeks on/1 week offstarting7/16/20  INTERVAL HISTORY: Yvonne Lang returns for f/u s/p thoracentesis today. They were able to remove 55 cc of fluid. Prior to thora, she had severe dyspnea and increased O2 to 6 liters. She remains on 40 mg prednisone. Could not ambulate to bathroom at home without getting extremely winded. She developed worsening cough over the last few days which seemed different from her usual dry cough. She feels like it would be productive if she could just get the phlegm out. Started mucinex. She called PCP who prescribed amoxicilin. Denies chest pain, fever, or chills. PCP also increased xanax to 0.5 mg TID PRN. She has had panic attacks when her dyspnea worsens. After thora, her dyspnea is much improved, now mild to moderate on exertion. She immediately reduced O2 to 3 liters. Cough also improved immediately. She has no other concerns with appetite, pain, n/v, bowels or new issues.    MEDICAL HISTORY:  Past Medical History:  Diagnosis Date   Anxiety    Bronchitis    COPD (chronic obstructive pulmonary disease) (HCC)    Depression    GERD (gastroesophageal reflux disease)    H/O gastric  bypass    1999   Irritable bowel syndrome    Lung cancer ([place])  SURGICAL HISTORY: Past Surgical History:  Procedure Laterality Date   BACK SURGERY     CHOLECYSTECTOMY     COLON SURGERY     for a kink in her colon and 12 inchs removed   COLONOSCOPY N/A 12/13/2013   Procedure: COLONOSCOPY;  Surgeon: Rogene Houston, MD;  Location: [facility];  Service: Endoscopy;  Laterality: N/A;  200   ESOPHAGOGASTRODUODENOSCOPY N/A 12/13/2013   Procedure: ESOPHAGOGASTRODUODENOSCOPY (EGD);  Surgeon: Rogene Houston, MD;  Location: [facility];  Service: Endoscopy;  Laterality: N/A;   Gastric Bypas  2000   IR IMAGING GUIDED PORT INSERTION  11/14/2017    I have reviewed the social history and family history with the patient and they are unchanged from previous note.  ALLERGIES:  is allergic to codeine.  MEDICATIONS:  Current Outpatient Medications  Medication Sig Dispense Refill   albuterol (VENTOLIN HFA) 108 (90 Base) MCG/ACT inhaler INHALE 2 PUFFS INTO THE LUNGS TWICE DAILY 8.5 g 1   ALPRAZolam (XANAX) 0.25 MG tablet Take 1 tablet (0.25 mg total) by mouth at bedtime as needed for anxiety or sleep. 30 tablet 0   butalbital-acetaminophen-caffeine (FIORICET, ESGIC) 50-325-40 MG tablet Take 1-2 tablets by mouth every 6 (six) hours as needed for headache. 60 tablet 1   CALCIUM-MAGNESIUM-ZINC PO Take 1 tablet by mouth daily.     dexamethasone (DECADRON) 4 MG tablet Take 2 tablets (8 mg total) by mouth 2 (two) times daily. Start the day before Taxotere, then take on day after chemo for 2 days 30 tablet 1   dicyclomine (BENTYL) 20 MG tablet Take 1 tablet (20 mg total) by mouth every 6 (six) hours. 60 tablet 1   FLUoxetine (PROZAC) 40 MG capsule Take 80 mg by mouth daily.     furosemide (LASIX) 20 MG tablet Take 1 tablet (20 mg total) by mouth daily as needed. (Patient taking differently: Take 20 mg by mouth daily as needed for fluid or edema. ) 30 tablet 0   gabapentin (NEURONTIN)  300 MG capsule Take 300 mg by mouth daily as needed (for pain).   4   guaiFENesin (MUCINEX) 600 MG 12 hr tablet Take 1 tablet (600 mg total) by mouth 2 (two) times daily. 60 tablet 0   HYDROcodone-acetaminophen (NORCO) 10-325 MG tablet Take 2 tablets by mouth every 6 (six) hours as needed (For pain.).      ipratropium-albuterol (DUONEB) 0.5-2.5 (3) MG/3ML SOLN Take 3 mLs by nebulization every 4 (four) hours as needed. (Patient taking differently: Take 3 mLs by nebulization every 4 (four) hours as needed (for shortness of breath). ) 360 mL 1   lidocaine-prilocaine (EMLA) cream APPLY 1 APPLICATION TO PORT TOPICALLY AS NEEDED ONE HOUR PRIOR TO PORT ACCESS/CHEMO (Patient taking differently: Apply 1 application topically as needed (For port-a-cath.). ) 30 g 0   magic mouthwash w/lidocaine SOLN Take 5 mLs by mouth 3 (three) times daily as needed for mouth pain. 240 mL 0   Multiple Vitamin (MULTIVITAMIN) tablet Take 1 tablet by mouth daily.     nystatin (MYCOSTATIN) 100000 UNIT/ML suspension SWISH ORALLY THEN SPIT WITH 5 ML THREE TIMES DAILY (Patient taking differently: Use as directed 5 mLs in the mouth or throat 3 (three) times daily. ) 240 mL 0   omeprazole (PRILOSEC) 20 MG capsule Take 1 capsule (20 mg total) by mouth 2 (two) times daily before a meal. 60 capsule 0   ondansetron (ZOFRAN) 8 MG tablet Take one tablet every 8 hours as needed for  nause 30 tablet 2   oxyCODONE (OXYCONTIN) 20 mg 12 hr tablet Take 1 tablet (20 mg total) by mouth every 12 (twelve) hours. 60 tablet 0   potassium chloride SA (K-DUR) 20 MEQ tablet Take 1 tablet by mouth once daily 30 tablet 0   predniSONE (DELTASONE) 10 MG tablet Take 1 tablets daily 30 tablet 3   prochlorperazine (COMPAZINE) 10 MG tablet TAKE 1 TABLET BY MOUTH EVERY 6 HOURS AS NEEDED FOR NAUSEA FOR VOMITING 30 tablet 0   rOPINIRole (REQUIP) 0.5 MG tablet Take 0.5 mg by mouth at bedtime as needed (For restless legs.).   11   sucralfate (CARAFATE) 1 g  tablet TAKE 1 TABLET(1 GRAM) BY MOUTH FOUR TIMES DAILY 120 tablet 0   TRELEGY ELLIPTA 100-62.5-25 MCG/INH AEPB Inhale 1 puff into the lungs daily.   5   zolpidem (AMBIEN) 10 MG tablet Take 10 mg by mouth at bedtime.      No current facility-administered medications for this visit.    Facility-Administered Medications Ordered in Other Visits  Medication Dose Route Frequency Provider Last Rate Last Dose   lidocaine (XYLOCAINE) 1 % (with pres) injection             PHYSICAL EXAMINATION:  Vitals:   01/16/19 1328  BP: (!) 134/91  Pulse: 93  Resp: 20  Temp: 98.5 F (36.9 C)  SpO2: 99%   Filed Weights   01/16/19 1328  Weight: 189 lb 11.2 oz (86 kg)    GENERAL:alert, no distress and comfortable SKIN: no rash  EYES:  sclera clear LUNGS: absent breath sounds on right, clear on left. Normal breathing effort on supplemental O2 3 liters HEART: regular rate & rhythm    LABORATORY DATA:  I have reviewed the data as listed CBC Latest Ref Rng & Units 01/04/2019 12/28/2018 12/14/2018  WBC 4.0 - 10.5 K/uL 7.8 9.2 5.8  Hemoglobin 12.0 - 15.0 g/dL 9.9(L) 9.4(L) 9.9(L)  Hematocrit 36.0 - 46.0 % 31.5(L) 30.5(L) 31.5(L)  Platelets 150 - 400 K/uL 462(H) 551(H) 336     CMP Latest Ref Rng & Units 01/04/2019 12/28/2018 12/14/2018  Glucose 70 - 99 mg/dL 111(H) 102(H) 109(H)  BUN 6 - 20 mg/dL <4(L) 9 4(L)  Creatinine 0.44 - 1.00 mg/dL 0.66 0.61 0.66  Sodium 135 - 145 mmol/L 135 142 139  Potassium 3.5 - 5.1 mmol/L 4.1 3.9 5.0  Chloride 98 - 111 mmol/L 99 105 104  CO2 22 - 32 mmol/L '29 30 29  ' Calcium 8.9 - 10.3 mg/dL 8.2(L) 7.6(L) 8.0(L)  Total Protein 6.5 - 8.1 g/dL 5.4(L) 5.1(L) 5.5(L)  Total Bilirubin 0.3 - 1.2 mg/dL 0.5 0.2(L) 0.3  Alkaline Phos 38 - 126 U/L 351(H) 246(H) 357(H)  AST 15 - 41 U/L 236(HH) 26 113(H)  ALT 0 - 44 U/L 65(H) 20 49(H)      RADIOGRAPHIC STUDIES: I have personally reviewed the radiological images as listed and agreed with the findings in the report. Dg Chest  1 View  Result Date: 01/16/2019 CLINICAL DATA:  Post thoracentesis EXAM: CHEST  1 VIEW COMPARISON:  01/08/2019 FINDINGS: No pneumothorax following right thoracentesis. Moderate right pleural effusion. Stable masslike opacity in the right hilar/infrahilar region. Left lung clear. IMPRESSION: No pneumothorax following thoracentesis. Moderate residual right pleural effusion. Electronically Signed   By: Rolm Baptise M.D.   On: 01/16/2019 12:48   US Thoracentesis Asp Pleural Space W/img Guide  Result Date: 01/16/2019 INDICATION: Patient with history of metastatic non-small cell lung cancer, recurrent right pleural effusion.  Request made for diagnostic and therapeutic right thoracentesis. EXAM: ULTRASOUND GUIDED DIAGNOSTIC AND THERAPEUTIC RIGHT THORACENTESIS MEDICATIONS: None COMPLICATIONS: None immediate. PROCEDURE: An ultrasound guided thoracentesis was thoroughly discussed with the patient and questions answered. The benefits, risks, alternatives and complications were also discussed. The patient understands and wishes to proceed with the procedure. Written consent was obtained. Ultrasound was performed to localize and mark an adequate pocket of fluid in the right chest. The area was then prepped and draped in the normal sterile fashion. 1% Lidocaine was used for local anesthesia. Under ultrasound guidance a 6 Fr Safe-T-Centesis catheter was introduced. Thoracentesis was performed. The catheter was removed and a dressing applied. FINDINGS: A total of approximately 50 cc of light yellow fluid was removed. Samples were sent to the laboratory as requested by the clinical team. The pleural fluid also contained a few fragments of fibrinous tissue which precluded removal of additional fluid at this time. Despite catheter manipulation as well as flushing only the above amount of fluid could be removed today. IMPRESSION: Successful ultrasound guided diagnostic and therapeutic right thoracentesis yielding 50 cc of  pleural fluid. Few fragments of fibrinous material noted in fluid which precluded removal of additional fluid. If effusion persists consider TCTS consult for VATS. Read by: Rowe Robert, PA-C Electronically Signed   By: Jacqulynn Cadet M.D.   On: 01/16/2019 12:57     ASSESSMENT & PLAN: Yvonne Lang a 52 y.o.femalewith   1. 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 -Diagnosed in 09/2017. Treated with chemo and radiation. -she has progressed through multiple line chemo, including immunotherapy -She started fourth line Gemcitabine2 weeks on/1 week offon 10/05/18.She tolerating moderately wellwith mild nausea, she is able to recover well . -11/13/18 CT CAP showed stable chest nodule since starting chemo in 09/2018, other lesions stable and no new metastasis. She continues treatment -s/p 5 cycles, restaging CT showed new right side pleural effusion and inguinal soft tissue mass, Dr. Burr Medico discussed her regimen is partially controlling her disease.  -she will have phone visit to discuss further options.  -we discussed her goals of care, I answered her questions about Hospice. While she wants to continue chemo as she is able, she also is focused on maximizing family quality time and quality of life. She has space set aside for a hospital bed when the time comes.  -she does not want hospital admission given family covid restrictions, she wants to stay at home if at all possible.  -pain is controlled   2.Dyspnea and dry cough secondary to her COPD,right lung cancer and radiation changes  -On continuebronchodilator -In 08/2017 she had recent COPD exacerbation andPneumoniainfection. She was treated inpatient with oxygen, antibiotics and steroids. -She was discharged on 3L oxygen [place] and 50m prednisone daily and breathing treatment TID.  -she was able to taper down to 5 mg prednisone but could not discontinue altogether, she is now back up to 10  mg -dyspnea worsened and imaging showed new right side pleural effusion, she required up to 6 liters O2 -first thoracentesis attempt did not yield any fluid, repeated today with 50 cc drawn off.  -she was immediately able to decrease O2 back to 3 liters, dyspnea and cough improved -At last visit Dr. FBurr Medicoincreased prednisone to 40 mg, she has not been able to wean yet -I recommend to try 30 mg daily, she agrees   3. Frontal Headaches -f/u with Dr. VMickeal Skinner-overall stableand improved -headaches are infrequent, uses tylenol PRN  4. Anxiety  and Depression  -Currently on Xanax and Cymbalta. -she had more depression this week when she had to put her dog down, she is tearful today.  -she continues to have positive but realistic outlook  5. Chronic back pain -previouslyon Tramadol. Better controlled on Hydrocodone 2 tabs q6hours.  -shepreviouslytried long acting OxyContin but this did not improve her pain. She will continue with Hydrocodone which is managed by her PCP. -stable  6. Low appetite, Nausea -Currently on Mirtazapine, omeprazole, ondansetron,prochlorperazine, and sucralfate. -improved with prednisone    7. Hypokalemia  -OnKCL 23mq daily  8.Transaminitis -Likely related to chemotherapy -Alk Phos with stable elevation -no labs today  9.Goal of care discussion, DNR/DNI -she understands the incurable nature of her disease and her treatment goal is palliative -she previously agreed DNR/DNI  Disposition:  Ms. WEischeidappears stable. Her dyspnea improved after 50 cc thoracentesis, fluid sent for cytology. She was able to decrease O2 from 6 L to 3 L immediately after the procedure. She remains of 40 mg prednisone. I recommend to wean to 30 mg daily if she tolerates. Her PCP prescribed amoxicillin for her cough, she complete the course.   Chest xray shows residual moderate pleural effusion, there was fibrinous fluid which was not amenable to thoracentesis. I  discussed referral to Dr. GServando Snarefor further recommendations and palliative management. Patient agrees.   She will have a phone visit later this week with Dr. FBurr Medicoto discuss treatment.    Orders Placed This Encounter  Procedures   Ambulatory referral to Cardiothoracic Surgery    Referral Priority:   Routine    Referral Type:   Surgical    Referral Reason:   Specialty Services Required    Referred to Provider:   GGrace Isaac MD    Requested Specialty:   Cardiothoracic Surgery    Number of Visits Requested:   1    All questions were answered. The patient knows to call the clinic with any problems, questions or concerns. No barriers to learning was detected.     LAlla Feeling NP 01/16/19

## 2019-01-16 NOTE — Procedures (Signed)
Ultrasound-guided diagnostic and therapeutic right thoracentesis performed yielding 50 cc of light yellow fluid with few fragments of fibrinous tissue which precluded removal of more fluid. The fluid was sent to the lab for cytology. Despite cath manipulation/flushing only the above amount of fluid could be removed. If fluid persists consider TCTS consult for possible VATS.  No immediate complications. Follow-up chest x-ray pending. EBL <2 cc.

## 2019-01-17 ENCOUNTER — Encounter: Payer: Self-pay | Admitting: Nurse Practitioner

## 2019-01-17 ENCOUNTER — Telehealth: Payer: Self-pay | Admitting: Medical Oncology

## 2019-01-17 ENCOUNTER — Telehealth: Payer: Self-pay | Admitting: Nurse Practitioner

## 2019-01-17 ENCOUNTER — Ambulatory Visit (HOSPITAL_COMMUNITY): Payer: BC Managed Care – PPO

## 2019-01-17 ENCOUNTER — Telehealth: Payer: Self-pay | Admitting: Hematology

## 2019-01-17 LAB — CYTOLOGY - NON PAP

## 2019-01-17 NOTE — Telephone Encounter (Signed)
No los per 10/27.

## 2019-01-17 NOTE — Telephone Encounter (Signed)
I called pt, spoke with her, her mother and sister in length. Pt has been having worsening dyspnea, and fatigue lately, did feel better after thoracentesis yesterday, but got worse again last night. She is on 5L [place] oxygen now, and spends most time in bed. I recommended hospice, pt and her sister are in complete agreement, her mother was not ready for hospice, but stated she will support [name]'s decision. I will make the hospice referral first thing in the morning. I explained the hospice service in detail to them.   I spent a total of 35 mins on the phone with pt and her family.  Truitt Merle  01/17/2019 5:45 PM

## 2019-01-17 NOTE — Telephone Encounter (Addendum)
I called pt after receiving her e-mail message. Thoracentesis yesterday of 50 ml . She says she is more comfortable now with  her breathing because her oxygen is up to 5.5 liters . She is considering hospice now because she wants  to have everything in place and not go to Hospice at the last minute. Lacie aware. I told pt that Hospice does not cover chemotherapy and that Lacie will call her back.   I call [facility] , Amy, and told her I will call back tomorrow.

## 2019-01-17 NOTE — Telephone Encounter (Signed)
1550: Provider provided this patient information, says she will call patient.

## 2019-01-18 ENCOUNTER — Inpatient Hospital Stay: Payer: BC Managed Care – PPO

## 2019-01-18 ENCOUNTER — Other Ambulatory Visit: Payer: Self-pay

## 2019-01-18 ENCOUNTER — Telehealth: Payer: Self-pay | Admitting: Nurse Practitioner

## 2019-01-18 ENCOUNTER — Inpatient Hospital Stay: Payer: BC Managed Care – PPO | Admitting: Hematology

## 2019-01-18 ENCOUNTER — Telehealth: Payer: Self-pay

## 2019-01-18 DIAGNOSIS — R601 Generalized edema: Secondary | ICD-10-CM | POA: Diagnosis not present

## 2019-01-18 DIAGNOSIS — C3490 Malignant neoplasm of unspecified part of unspecified bronchus or lung: Secondary | ICD-10-CM

## 2019-01-18 DIAGNOSIS — F419 Anxiety disorder, unspecified: Secondary | ICD-10-CM | POA: Diagnosis not present

## 2019-01-18 DIAGNOSIS — G894 Chronic pain syndrome: Secondary | ICD-10-CM | POA: Diagnosis not present

## 2019-01-18 NOTE — Telephone Encounter (Signed)
Spoke with Yvonne Lang at [facility], they made telephone contact with the patient and mother today, going out there in person tomorrow 01/19/2019 at 11:00 am.  Informed Cira Rue NP of plan.

## 2019-01-18 NOTE — Telephone Encounter (Signed)
I called Shantrice this afternoon to f/u with her. She feels OK on 6 liters, resting at home with family. She had a call with Hospice today and there was some confusion about a document. They are going to try to touch base tomorrow. Given the urgency of her situation I feel an in-person encounter would be best. I asked my nurse to f/u and hospice is indeed planning to meet her/family tomorrow in person on 10/30. Cathyrn declined other needs at this time, she appreciated our support and care and the phone call.  Cira Rue, NP  01/18/19

## 2019-01-19 DIAGNOSIS — R112 Nausea with vomiting, unspecified: Secondary | ICD-10-CM | POA: Diagnosis not present

## 2019-01-19 DIAGNOSIS — R52 Pain, unspecified: Secondary | ICD-10-CM | POA: Diagnosis not present

## 2019-01-19 DIAGNOSIS — F419 Anxiety disorder, unspecified: Secondary | ICD-10-CM | POA: Diagnosis not present

## 2019-01-19 DIAGNOSIS — F339 Major depressive disorder, recurrent, unspecified: Secondary | ICD-10-CM | POA: Diagnosis not present

## 2019-01-19 DIAGNOSIS — J449 Chronic obstructive pulmonary disease, unspecified: Secondary | ICD-10-CM | POA: Diagnosis not present

## 2019-01-19 DIAGNOSIS — R531 Weakness: Secondary | ICD-10-CM | POA: Diagnosis not present

## 2019-01-19 DIAGNOSIS — K219 Gastro-esophageal reflux disease without esophagitis: Secondary | ICD-10-CM | POA: Diagnosis not present

## 2019-01-19 DIAGNOSIS — R0602 Shortness of breath: Secondary | ICD-10-CM | POA: Diagnosis not present

## 2019-01-19 DIAGNOSIS — C3490 Malignant neoplasm of unspecified part of unspecified bronchus or lung: Secondary | ICD-10-CM | POA: Diagnosis not present

## 2019-01-20 DIAGNOSIS — K219 Gastro-esophageal reflux disease without esophagitis: Secondary | ICD-10-CM | POA: Diagnosis not present

## 2019-01-20 DIAGNOSIS — R531 Weakness: Secondary | ICD-10-CM | POA: Diagnosis not present

## 2019-01-20 DIAGNOSIS — R52 Pain, unspecified: Secondary | ICD-10-CM | POA: Diagnosis not present

## 2019-01-20 DIAGNOSIS — F339 Major depressive disorder, recurrent, unspecified: Secondary | ICD-10-CM | POA: Diagnosis not present

## 2019-01-20 DIAGNOSIS — J449 Chronic obstructive pulmonary disease, unspecified: Secondary | ICD-10-CM | POA: Diagnosis not present

## 2019-01-20 DIAGNOSIS — R112 Nausea with vomiting, unspecified: Secondary | ICD-10-CM | POA: Diagnosis not present

## 2019-01-20 DIAGNOSIS — R0602 Shortness of breath: Secondary | ICD-10-CM | POA: Diagnosis not present

## 2019-01-20 DIAGNOSIS — F419 Anxiety disorder, unspecified: Secondary | ICD-10-CM | POA: Diagnosis not present

## 2019-01-20 DIAGNOSIS — C3490 Malignant neoplasm of unspecified part of unspecified bronchus or lung: Secondary | ICD-10-CM | POA: Diagnosis not present

## 2019-01-21 DIAGNOSIS — R52 Pain, unspecified: Secondary | ICD-10-CM | POA: Diagnosis not present

## 2019-01-21 DIAGNOSIS — J449 Chronic obstructive pulmonary disease, unspecified: Secondary | ICD-10-CM | POA: Diagnosis not present

## 2019-01-21 DIAGNOSIS — R531 Weakness: Secondary | ICD-10-CM | POA: Diagnosis not present

## 2019-01-21 DIAGNOSIS — F419 Anxiety disorder, unspecified: Secondary | ICD-10-CM | POA: Diagnosis not present

## 2019-01-21 DIAGNOSIS — R112 Nausea with vomiting, unspecified: Secondary | ICD-10-CM | POA: Diagnosis not present

## 2019-01-21 DIAGNOSIS — C3490 Malignant neoplasm of unspecified part of unspecified bronchus or lung: Secondary | ICD-10-CM | POA: Diagnosis not present

## 2019-01-21 DIAGNOSIS — F339 Major depressive disorder, recurrent, unspecified: Secondary | ICD-10-CM | POA: Diagnosis not present

## 2019-01-21 DIAGNOSIS — R0602 Shortness of breath: Secondary | ICD-10-CM | POA: Diagnosis not present

## 2019-01-21 DIAGNOSIS — K219 Gastro-esophageal reflux disease without esophagitis: Secondary | ICD-10-CM | POA: Diagnosis not present

## 2019-01-22 ENCOUNTER — Encounter: Payer: Self-pay | Admitting: Nurse Practitioner

## 2019-01-22 DIAGNOSIS — C3490 Malignant neoplasm of unspecified part of unspecified bronchus or lung: Secondary | ICD-10-CM | POA: Diagnosis not present

## 2019-01-22 DIAGNOSIS — F339 Major depressive disorder, recurrent, unspecified: Secondary | ICD-10-CM | POA: Diagnosis not present

## 2019-01-22 DIAGNOSIS — R52 Pain, unspecified: Secondary | ICD-10-CM | POA: Diagnosis not present

## 2019-01-22 DIAGNOSIS — R531 Weakness: Secondary | ICD-10-CM | POA: Diagnosis not present

## 2019-01-22 DIAGNOSIS — K219 Gastro-esophageal reflux disease without esophagitis: Secondary | ICD-10-CM | POA: Diagnosis not present

## 2019-01-22 DIAGNOSIS — R112 Nausea with vomiting, unspecified: Secondary | ICD-10-CM | POA: Diagnosis not present

## 2019-01-22 DIAGNOSIS — R0602 Shortness of breath: Secondary | ICD-10-CM | POA: Diagnosis not present

## 2019-01-22 DIAGNOSIS — F419 Anxiety disorder, unspecified: Secondary | ICD-10-CM | POA: Diagnosis not present

## 2019-01-22 DIAGNOSIS — J449 Chronic obstructive pulmonary disease, unspecified: Secondary | ICD-10-CM | POA: Diagnosis not present

## 2019-01-23 ENCOUNTER — Other Ambulatory Visit: Payer: Self-pay | Admitting: Nurse Practitioner

## 2019-01-23 DIAGNOSIS — J449 Chronic obstructive pulmonary disease, unspecified: Secondary | ICD-10-CM | POA: Diagnosis not present

## 2019-01-23 DIAGNOSIS — R0602 Shortness of breath: Secondary | ICD-10-CM | POA: Diagnosis not present

## 2019-01-23 DIAGNOSIS — C3490 Malignant neoplasm of unspecified part of unspecified bronchus or lung: Secondary | ICD-10-CM | POA: Diagnosis not present

## 2019-01-23 DIAGNOSIS — K219 Gastro-esophageal reflux disease without esophagitis: Secondary | ICD-10-CM | POA: Diagnosis not present

## 2019-01-23 DIAGNOSIS — R52 Pain, unspecified: Secondary | ICD-10-CM | POA: Diagnosis not present

## 2019-01-23 DIAGNOSIS — R531 Weakness: Secondary | ICD-10-CM | POA: Diagnosis not present

## 2019-01-23 DIAGNOSIS — F419 Anxiety disorder, unspecified: Secondary | ICD-10-CM | POA: Diagnosis not present

## 2019-01-23 DIAGNOSIS — F339 Major depressive disorder, recurrent, unspecified: Secondary | ICD-10-CM | POA: Diagnosis not present

## 2019-01-23 DIAGNOSIS — R112 Nausea with vomiting, unspecified: Secondary | ICD-10-CM | POA: Diagnosis not present

## 2019-01-23 MED ORDER — HYDROCOD POLST-CPM POLST ER 10-8 MG/5ML PO SUER: 5.0000 mL | Freq: Two times a day (BID) | ORAL | 0 refills | Status: AC

## 2019-01-23 NOTE — Progress Notes (Signed)
I called Yvonne Lang about her request for additional antibiotic. Her PCP previously prescribed amoxicillin that did not help her cough. She states it feels congested but does not get phlegm up. mucinex did not help. She is wheezing. Cough is worse at night, she sleeps inclined on pillows. She is on 4 liters O2 and 30 mg prednisone. Some days are better than others. She sounds dyspneic while talking to me. Denies fever, chills. I recommend tussionex cough syrup which I sent to [facility]. I suspect her cough is related to the large right side pleural effusion, likely not an infection. I told her she can increase her O2 and take 40 mg prednisone because her breathing is better on 40 mg. Continue inhaler. She takes anti-anxiety med PRN as well. I will discuss other measures with Dr. Burr Medico and the hospice team such as morphine etc. Will f/u with her in 1-2 days. She appreciates the call.  Cira Rue, NP  01/23/19

## 2019-01-24 ENCOUNTER — Telehealth: Payer: Self-pay

## 2019-01-24 DIAGNOSIS — R531 Weakness: Secondary | ICD-10-CM | POA: Diagnosis not present

## 2019-01-24 DIAGNOSIS — C3490 Malignant neoplasm of unspecified part of unspecified bronchus or lung: Secondary | ICD-10-CM | POA: Diagnosis not present

## 2019-01-24 DIAGNOSIS — F419 Anxiety disorder, unspecified: Secondary | ICD-10-CM | POA: Diagnosis not present

## 2019-01-24 DIAGNOSIS — R112 Nausea with vomiting, unspecified: Secondary | ICD-10-CM | POA: Diagnosis not present

## 2019-01-24 DIAGNOSIS — R52 Pain, unspecified: Secondary | ICD-10-CM | POA: Diagnosis not present

## 2019-01-24 DIAGNOSIS — R0602 Shortness of breath: Secondary | ICD-10-CM | POA: Diagnosis not present

## 2019-01-24 DIAGNOSIS — J449 Chronic obstructive pulmonary disease, unspecified: Secondary | ICD-10-CM | POA: Diagnosis not present

## 2019-01-24 DIAGNOSIS — F339 Major depressive disorder, recurrent, unspecified: Secondary | ICD-10-CM | POA: Diagnosis not present

## 2019-01-24 DIAGNOSIS — K219 Gastro-esophageal reflux disease without esophagitis: Secondary | ICD-10-CM | POA: Diagnosis not present

## 2019-01-24 NOTE — Telephone Encounter (Signed)
Spoke with [facility] regarding patient's increased dyspnea. Per RN they have standing orders in place in the home whereby when the patient experiences this she has Morphine on hand that she can take every 15 minutes until breathing becomes less labored. She will contact the patient to go over this protocol again and encourage her to do so.  Notified Cira Rue NP.

## 2019-01-24 NOTE — Telephone Encounter (Signed)
-----   Message from Alla Feeling, NP sent at 01/24/2019  8:02 AM EST ----- Davene Costain, please call her hospice nurse and ask someone to see her this morning. She has severe dyspnea on the phone last night. Have them implement standing orders for that. Please ask what the family visitor policy is for their residential hospice in [place]. Elvira may need to be there as her respiratory distress worsens, but she will probably not agree if her mother and sister can't be there.  Thanks, Regan Rakers

## 2019-01-25 ENCOUNTER — Ambulatory Visit: Payer: BC Managed Care – PPO

## 2019-01-25 ENCOUNTER — Other Ambulatory Visit: Payer: BC Managed Care – PPO

## 2019-01-25 ENCOUNTER — Telehealth: Payer: Self-pay

## 2019-01-25 DIAGNOSIS — R52 Pain, unspecified: Secondary | ICD-10-CM | POA: Diagnosis not present

## 2019-01-25 DIAGNOSIS — R531 Weakness: Secondary | ICD-10-CM | POA: Diagnosis not present

## 2019-01-25 DIAGNOSIS — C3490 Malignant neoplasm of unspecified part of unspecified bronchus or lung: Secondary | ICD-10-CM | POA: Diagnosis not present

## 2019-01-25 DIAGNOSIS — R112 Nausea with vomiting, unspecified: Secondary | ICD-10-CM | POA: Diagnosis not present

## 2019-01-25 DIAGNOSIS — K219 Gastro-esophageal reflux disease without esophagitis: Secondary | ICD-10-CM | POA: Diagnosis not present

## 2019-01-25 DIAGNOSIS — F339 Major depressive disorder, recurrent, unspecified: Secondary | ICD-10-CM | POA: Diagnosis not present

## 2019-01-25 DIAGNOSIS — R0602 Shortness of breath: Secondary | ICD-10-CM | POA: Diagnosis not present

## 2019-01-25 DIAGNOSIS — J449 Chronic obstructive pulmonary disease, unspecified: Secondary | ICD-10-CM | POA: Diagnosis not present

## 2019-01-25 DIAGNOSIS — F419 Anxiety disorder, unspecified: Secondary | ICD-10-CM | POA: Diagnosis not present

## 2019-01-25 NOTE — Telephone Encounter (Signed)
Faxed signed orders back to [facility] to fax [phone], sent to HIM for scan to chart.

## 2019-01-26 ENCOUNTER — Telehealth: Payer: Self-pay

## 2019-01-26 DIAGNOSIS — F339 Major depressive disorder, recurrent, unspecified: Secondary | ICD-10-CM | POA: Diagnosis not present

## 2019-01-26 DIAGNOSIS — R0602 Shortness of breath: Secondary | ICD-10-CM | POA: Diagnosis not present

## 2019-01-26 DIAGNOSIS — K219 Gastro-esophageal reflux disease without esophagitis: Secondary | ICD-10-CM | POA: Diagnosis not present

## 2019-01-26 DIAGNOSIS — J449 Chronic obstructive pulmonary disease, unspecified: Secondary | ICD-10-CM | POA: Diagnosis not present

## 2019-01-26 DIAGNOSIS — C3490 Malignant neoplasm of unspecified part of unspecified bronchus or lung: Secondary | ICD-10-CM | POA: Diagnosis not present

## 2019-01-26 DIAGNOSIS — R112 Nausea with vomiting, unspecified: Secondary | ICD-10-CM | POA: Diagnosis not present

## 2019-01-26 DIAGNOSIS — F419 Anxiety disorder, unspecified: Secondary | ICD-10-CM | POA: Diagnosis not present

## 2019-01-26 DIAGNOSIS — R531 Weakness: Secondary | ICD-10-CM | POA: Diagnosis not present

## 2019-01-26 DIAGNOSIS — R52 Pain, unspecified: Secondary | ICD-10-CM | POA: Diagnosis not present

## 2019-01-26 NOTE — Telephone Encounter (Signed)
Faxed signed orders back to [facility]. Sent to HIM for scan to chart.

## 2019-01-27 DIAGNOSIS — J449 Chronic obstructive pulmonary disease, unspecified: Secondary | ICD-10-CM | POA: Diagnosis not present

## 2019-01-27 DIAGNOSIS — F419 Anxiety disorder, unspecified: Secondary | ICD-10-CM | POA: Diagnosis not present

## 2019-01-27 DIAGNOSIS — K219 Gastro-esophageal reflux disease without esophagitis: Secondary | ICD-10-CM | POA: Diagnosis not present

## 2019-01-27 DIAGNOSIS — R531 Weakness: Secondary | ICD-10-CM | POA: Diagnosis not present

## 2019-01-27 DIAGNOSIS — F339 Major depressive disorder, recurrent, unspecified: Secondary | ICD-10-CM | POA: Diagnosis not present

## 2019-01-27 DIAGNOSIS — R0602 Shortness of breath: Secondary | ICD-10-CM | POA: Diagnosis not present

## 2019-01-27 DIAGNOSIS — R52 Pain, unspecified: Secondary | ICD-10-CM | POA: Diagnosis not present

## 2019-01-27 DIAGNOSIS — R112 Nausea with vomiting, unspecified: Secondary | ICD-10-CM | POA: Diagnosis not present

## 2019-01-27 DIAGNOSIS — C3490 Malignant neoplasm of unspecified part of unspecified bronchus or lung: Secondary | ICD-10-CM | POA: Diagnosis not present

## 2019-01-28 DIAGNOSIS — C3490 Malignant neoplasm of unspecified part of unspecified bronchus or lung: Secondary | ICD-10-CM | POA: Diagnosis not present

## 2019-01-28 DIAGNOSIS — R531 Weakness: Secondary | ICD-10-CM | POA: Diagnosis not present

## 2019-01-28 DIAGNOSIS — K219 Gastro-esophageal reflux disease without esophagitis: Secondary | ICD-10-CM | POA: Diagnosis not present

## 2019-01-28 DIAGNOSIS — R52 Pain, unspecified: Secondary | ICD-10-CM | POA: Diagnosis not present

## 2019-01-28 DIAGNOSIS — R112 Nausea with vomiting, unspecified: Secondary | ICD-10-CM | POA: Diagnosis not present

## 2019-01-28 DIAGNOSIS — F339 Major depressive disorder, recurrent, unspecified: Secondary | ICD-10-CM | POA: Diagnosis not present

## 2019-01-28 DIAGNOSIS — J449 Chronic obstructive pulmonary disease, unspecified: Secondary | ICD-10-CM | POA: Diagnosis not present

## 2019-01-28 DIAGNOSIS — F419 Anxiety disorder, unspecified: Secondary | ICD-10-CM | POA: Diagnosis not present

## 2019-01-28 DIAGNOSIS — R0602 Shortness of breath: Secondary | ICD-10-CM | POA: Diagnosis not present

## 2019-01-29 DIAGNOSIS — F419 Anxiety disorder, unspecified: Secondary | ICD-10-CM | POA: Diagnosis not present

## 2019-01-29 DIAGNOSIS — J449 Chronic obstructive pulmonary disease, unspecified: Secondary | ICD-10-CM | POA: Diagnosis not present

## 2019-01-29 DIAGNOSIS — F339 Major depressive disorder, recurrent, unspecified: Secondary | ICD-10-CM | POA: Diagnosis not present

## 2019-01-29 DIAGNOSIS — K219 Gastro-esophageal reflux disease without esophagitis: Secondary | ICD-10-CM | POA: Diagnosis not present

## 2019-01-29 DIAGNOSIS — C3490 Malignant neoplasm of unspecified part of unspecified bronchus or lung: Secondary | ICD-10-CM | POA: Diagnosis not present

## 2019-01-29 DIAGNOSIS — R531 Weakness: Secondary | ICD-10-CM | POA: Diagnosis not present

## 2019-01-29 DIAGNOSIS — R52 Pain, unspecified: Secondary | ICD-10-CM | POA: Diagnosis not present

## 2019-01-29 DIAGNOSIS — R0602 Shortness of breath: Secondary | ICD-10-CM | POA: Diagnosis not present

## 2019-01-29 DIAGNOSIS — R112 Nausea with vomiting, unspecified: Secondary | ICD-10-CM | POA: Diagnosis not present

## 2019-01-30 DIAGNOSIS — K219 Gastro-esophageal reflux disease without esophagitis: Secondary | ICD-10-CM | POA: Diagnosis not present

## 2019-01-30 DIAGNOSIS — R52 Pain, unspecified: Secondary | ICD-10-CM | POA: Diagnosis not present

## 2019-01-30 DIAGNOSIS — R531 Weakness: Secondary | ICD-10-CM | POA: Diagnosis not present

## 2019-01-30 DIAGNOSIS — R112 Nausea with vomiting, unspecified: Secondary | ICD-10-CM | POA: Diagnosis not present

## 2019-01-30 DIAGNOSIS — C3490 Malignant neoplasm of unspecified part of unspecified bronchus or lung: Secondary | ICD-10-CM | POA: Diagnosis not present

## 2019-01-30 DIAGNOSIS — R0602 Shortness of breath: Secondary | ICD-10-CM | POA: Diagnosis not present

## 2019-01-30 DIAGNOSIS — F339 Major depressive disorder, recurrent, unspecified: Secondary | ICD-10-CM | POA: Diagnosis not present

## 2019-01-30 DIAGNOSIS — F419 Anxiety disorder, unspecified: Secondary | ICD-10-CM | POA: Diagnosis not present

## 2019-01-30 DIAGNOSIS — J449 Chronic obstructive pulmonary disease, unspecified: Secondary | ICD-10-CM | POA: Diagnosis not present

## 2019-01-31 ENCOUNTER — Telehealth: Payer: Self-pay

## 2019-01-31 DIAGNOSIS — K219 Gastro-esophageal reflux disease without esophagitis: Secondary | ICD-10-CM | POA: Diagnosis not present

## 2019-01-31 DIAGNOSIS — F419 Anxiety disorder, unspecified: Secondary | ICD-10-CM | POA: Diagnosis not present

## 2019-01-31 DIAGNOSIS — F339 Major depressive disorder, recurrent, unspecified: Secondary | ICD-10-CM | POA: Diagnosis not present

## 2019-01-31 DIAGNOSIS — R112 Nausea with vomiting, unspecified: Secondary | ICD-10-CM | POA: Diagnosis not present

## 2019-01-31 DIAGNOSIS — J449 Chronic obstructive pulmonary disease, unspecified: Secondary | ICD-10-CM | POA: Diagnosis not present

## 2019-01-31 DIAGNOSIS — R531 Weakness: Secondary | ICD-10-CM | POA: Diagnosis not present

## 2019-01-31 DIAGNOSIS — R0602 Shortness of breath: Secondary | ICD-10-CM | POA: Diagnosis not present

## 2019-01-31 DIAGNOSIS — C3490 Malignant neoplasm of unspecified part of unspecified bronchus or lung: Secondary | ICD-10-CM | POA: Diagnosis not present

## 2019-01-31 DIAGNOSIS — R52 Pain, unspecified: Secondary | ICD-10-CM | POA: Diagnosis not present

## 2019-01-31 NOTE — Telephone Encounter (Signed)
Faxed signed orders back to [facility], sent to HIM for scan to chart.

## 2019-02-01 DIAGNOSIS — F339 Major depressive disorder, recurrent, unspecified: Secondary | ICD-10-CM | POA: Diagnosis not present

## 2019-02-01 DIAGNOSIS — R0602 Shortness of breath: Secondary | ICD-10-CM | POA: Diagnosis not present

## 2019-02-01 DIAGNOSIS — K219 Gastro-esophageal reflux disease without esophagitis: Secondary | ICD-10-CM | POA: Diagnosis not present

## 2019-02-01 DIAGNOSIS — R112 Nausea with vomiting, unspecified: Secondary | ICD-10-CM | POA: Diagnosis not present

## 2019-02-01 DIAGNOSIS — R52 Pain, unspecified: Secondary | ICD-10-CM | POA: Diagnosis not present

## 2019-02-01 DIAGNOSIS — J449 Chronic obstructive pulmonary disease, unspecified: Secondary | ICD-10-CM | POA: Diagnosis not present

## 2019-02-01 DIAGNOSIS — C3490 Malignant neoplasm of unspecified part of unspecified bronchus or lung: Secondary | ICD-10-CM | POA: Diagnosis not present

## 2019-02-01 DIAGNOSIS — F419 Anxiety disorder, unspecified: Secondary | ICD-10-CM | POA: Diagnosis not present

## 2019-02-01 DIAGNOSIS — R531 Weakness: Secondary | ICD-10-CM | POA: Diagnosis not present

## 2019-02-02 DIAGNOSIS — R112 Nausea with vomiting, unspecified: Secondary | ICD-10-CM | POA: Diagnosis not present

## 2019-02-02 DIAGNOSIS — R52 Pain, unspecified: Secondary | ICD-10-CM | POA: Diagnosis not present

## 2019-02-02 DIAGNOSIS — J449 Chronic obstructive pulmonary disease, unspecified: Secondary | ICD-10-CM | POA: Diagnosis not present

## 2019-02-02 DIAGNOSIS — R0602 Shortness of breath: Secondary | ICD-10-CM | POA: Diagnosis not present

## 2019-02-02 DIAGNOSIS — C3490 Malignant neoplasm of unspecified part of unspecified bronchus or lung: Secondary | ICD-10-CM | POA: Diagnosis not present

## 2019-02-02 DIAGNOSIS — R531 Weakness: Secondary | ICD-10-CM | POA: Diagnosis not present

## 2019-02-02 DIAGNOSIS — K219 Gastro-esophageal reflux disease without esophagitis: Secondary | ICD-10-CM | POA: Diagnosis not present

## 2019-02-02 DIAGNOSIS — F339 Major depressive disorder, recurrent, unspecified: Secondary | ICD-10-CM | POA: Diagnosis not present

## 2019-02-02 DIAGNOSIS — F419 Anxiety disorder, unspecified: Secondary | ICD-10-CM | POA: Diagnosis not present

## 2019-02-03 DIAGNOSIS — R112 Nausea with vomiting, unspecified: Secondary | ICD-10-CM | POA: Diagnosis not present

## 2019-02-03 DIAGNOSIS — F339 Major depressive disorder, recurrent, unspecified: Secondary | ICD-10-CM | POA: Diagnosis not present

## 2019-02-03 DIAGNOSIS — R531 Weakness: Secondary | ICD-10-CM | POA: Diagnosis not present

## 2019-02-03 DIAGNOSIS — K219 Gastro-esophageal reflux disease without esophagitis: Secondary | ICD-10-CM | POA: Diagnosis not present

## 2019-02-03 DIAGNOSIS — F419 Anxiety disorder, unspecified: Secondary | ICD-10-CM | POA: Diagnosis not present

## 2019-02-03 DIAGNOSIS — C3490 Malignant neoplasm of unspecified part of unspecified bronchus or lung: Secondary | ICD-10-CM | POA: Diagnosis not present

## 2019-02-03 DIAGNOSIS — J449 Chronic obstructive pulmonary disease, unspecified: Secondary | ICD-10-CM | POA: Diagnosis not present

## 2019-02-03 DIAGNOSIS — R52 Pain, unspecified: Secondary | ICD-10-CM | POA: Diagnosis not present

## 2019-02-03 DIAGNOSIS — R0602 Shortness of breath: Secondary | ICD-10-CM | POA: Diagnosis not present

## 2019-02-04 DIAGNOSIS — R112 Nausea with vomiting, unspecified: Secondary | ICD-10-CM | POA: Diagnosis not present

## 2019-02-04 DIAGNOSIS — J449 Chronic obstructive pulmonary disease, unspecified: Secondary | ICD-10-CM | POA: Diagnosis not present

## 2019-02-04 DIAGNOSIS — R531 Weakness: Secondary | ICD-10-CM | POA: Diagnosis not present

## 2019-02-04 DIAGNOSIS — R0602 Shortness of breath: Secondary | ICD-10-CM | POA: Diagnosis not present

## 2019-02-04 DIAGNOSIS — C3490 Malignant neoplasm of unspecified part of unspecified bronchus or lung: Secondary | ICD-10-CM | POA: Diagnosis not present

## 2019-02-04 DIAGNOSIS — F419 Anxiety disorder, unspecified: Secondary | ICD-10-CM | POA: Diagnosis not present

## 2019-02-04 DIAGNOSIS — K219 Gastro-esophageal reflux disease without esophagitis: Secondary | ICD-10-CM | POA: Diagnosis not present

## 2019-02-04 DIAGNOSIS — F339 Major depressive disorder, recurrent, unspecified: Secondary | ICD-10-CM | POA: Diagnosis not present

## 2019-02-04 DIAGNOSIS — R52 Pain, unspecified: Secondary | ICD-10-CM | POA: Diagnosis not present

## 2019-02-05 DIAGNOSIS — J449 Chronic obstructive pulmonary disease, unspecified: Secondary | ICD-10-CM | POA: Diagnosis not present

## 2019-02-05 DIAGNOSIS — C3490 Malignant neoplasm of unspecified part of unspecified bronchus or lung: Secondary | ICD-10-CM | POA: Diagnosis not present

## 2019-02-05 DIAGNOSIS — R531 Weakness: Secondary | ICD-10-CM | POA: Diagnosis not present

## 2019-02-05 DIAGNOSIS — R0602 Shortness of breath: Secondary | ICD-10-CM | POA: Diagnosis not present

## 2019-02-05 DIAGNOSIS — R112 Nausea with vomiting, unspecified: Secondary | ICD-10-CM | POA: Diagnosis not present

## 2019-02-05 DIAGNOSIS — F339 Major depressive disorder, recurrent, unspecified: Secondary | ICD-10-CM | POA: Diagnosis not present

## 2019-02-05 DIAGNOSIS — K219 Gastro-esophageal reflux disease without esophagitis: Secondary | ICD-10-CM | POA: Diagnosis not present

## 2019-02-05 DIAGNOSIS — R52 Pain, unspecified: Secondary | ICD-10-CM | POA: Diagnosis not present

## 2019-02-05 DIAGNOSIS — F419 Anxiety disorder, unspecified: Secondary | ICD-10-CM | POA: Diagnosis not present

## 2019-02-06 DIAGNOSIS — F339 Major depressive disorder, recurrent, unspecified: Secondary | ICD-10-CM | POA: Diagnosis not present

## 2019-02-06 DIAGNOSIS — R112 Nausea with vomiting, unspecified: Secondary | ICD-10-CM | POA: Diagnosis not present

## 2019-02-06 DIAGNOSIS — F419 Anxiety disorder, unspecified: Secondary | ICD-10-CM | POA: Diagnosis not present

## 2019-02-06 DIAGNOSIS — R52 Pain, unspecified: Secondary | ICD-10-CM | POA: Diagnosis not present

## 2019-02-06 DIAGNOSIS — R531 Weakness: Secondary | ICD-10-CM | POA: Diagnosis not present

## 2019-02-06 DIAGNOSIS — C3490 Malignant neoplasm of unspecified part of unspecified bronchus or lung: Secondary | ICD-10-CM | POA: Diagnosis not present

## 2019-02-06 DIAGNOSIS — K219 Gastro-esophageal reflux disease without esophagitis: Secondary | ICD-10-CM | POA: Diagnosis not present

## 2019-02-06 DIAGNOSIS — R0602 Shortness of breath: Secondary | ICD-10-CM | POA: Diagnosis not present

## 2019-02-06 DIAGNOSIS — J449 Chronic obstructive pulmonary disease, unspecified: Secondary | ICD-10-CM | POA: Diagnosis not present

## 2019-02-07 DIAGNOSIS — R531 Weakness: Secondary | ICD-10-CM | POA: Diagnosis not present

## 2019-02-07 DIAGNOSIS — K219 Gastro-esophageal reflux disease without esophagitis: Secondary | ICD-10-CM | POA: Diagnosis not present

## 2019-02-07 DIAGNOSIS — R0602 Shortness of breath: Secondary | ICD-10-CM | POA: Diagnosis not present

## 2019-02-07 DIAGNOSIS — R52 Pain, unspecified: Secondary | ICD-10-CM | POA: Diagnosis not present

## 2019-02-07 DIAGNOSIS — J449 Chronic obstructive pulmonary disease, unspecified: Secondary | ICD-10-CM | POA: Diagnosis not present

## 2019-02-07 DIAGNOSIS — F419 Anxiety disorder, unspecified: Secondary | ICD-10-CM | POA: Diagnosis not present

## 2019-02-07 DIAGNOSIS — C3490 Malignant neoplasm of unspecified part of unspecified bronchus or lung: Secondary | ICD-10-CM | POA: Diagnosis not present

## 2019-02-07 DIAGNOSIS — F339 Major depressive disorder, recurrent, unspecified: Secondary | ICD-10-CM | POA: Diagnosis not present

## 2019-02-07 DIAGNOSIS — R112 Nausea with vomiting, unspecified: Secondary | ICD-10-CM | POA: Diagnosis not present

## 2019-02-08 ENCOUNTER — Other Ambulatory Visit: Payer: Self-pay | Admitting: Hematology

## 2019-02-08 DIAGNOSIS — F419 Anxiety disorder, unspecified: Secondary | ICD-10-CM | POA: Diagnosis not present

## 2019-02-08 DIAGNOSIS — K219 Gastro-esophageal reflux disease without esophagitis: Secondary | ICD-10-CM | POA: Diagnosis not present

## 2019-02-08 DIAGNOSIS — J449 Chronic obstructive pulmonary disease, unspecified: Secondary | ICD-10-CM | POA: Diagnosis not present

## 2019-02-08 DIAGNOSIS — C3490 Malignant neoplasm of unspecified part of unspecified bronchus or lung: Secondary | ICD-10-CM

## 2019-02-08 DIAGNOSIS — F339 Major depressive disorder, recurrent, unspecified: Secondary | ICD-10-CM | POA: Diagnosis not present

## 2019-02-08 DIAGNOSIS — R52 Pain, unspecified: Secondary | ICD-10-CM | POA: Diagnosis not present

## 2019-02-08 DIAGNOSIS — R531 Weakness: Secondary | ICD-10-CM | POA: Diagnosis not present

## 2019-02-08 DIAGNOSIS — R0602 Shortness of breath: Secondary | ICD-10-CM | POA: Diagnosis not present

## 2019-02-08 DIAGNOSIS — R112 Nausea with vomiting, unspecified: Secondary | ICD-10-CM | POA: Diagnosis not present

## 2019-02-09 DIAGNOSIS — C3490 Malignant neoplasm of unspecified part of unspecified bronchus or lung: Secondary | ICD-10-CM | POA: Diagnosis not present

## 2019-02-09 DIAGNOSIS — R52 Pain, unspecified: Secondary | ICD-10-CM | POA: Diagnosis not present

## 2019-02-09 DIAGNOSIS — R112 Nausea with vomiting, unspecified: Secondary | ICD-10-CM | POA: Diagnosis not present

## 2019-02-09 DIAGNOSIS — F339 Major depressive disorder, recurrent, unspecified: Secondary | ICD-10-CM | POA: Diagnosis not present

## 2019-02-09 DIAGNOSIS — K219 Gastro-esophageal reflux disease without esophagitis: Secondary | ICD-10-CM | POA: Diagnosis not present

## 2019-02-09 DIAGNOSIS — F419 Anxiety disorder, unspecified: Secondary | ICD-10-CM | POA: Diagnosis not present

## 2019-02-09 DIAGNOSIS — J449 Chronic obstructive pulmonary disease, unspecified: Secondary | ICD-10-CM | POA: Diagnosis not present

## 2019-02-09 DIAGNOSIS — R0602 Shortness of breath: Secondary | ICD-10-CM | POA: Diagnosis not present

## 2019-02-09 DIAGNOSIS — R531 Weakness: Secondary | ICD-10-CM | POA: Diagnosis not present

## 2019-02-10 DIAGNOSIS — K219 Gastro-esophageal reflux disease without esophagitis: Secondary | ICD-10-CM | POA: Diagnosis not present

## 2019-02-10 DIAGNOSIS — R531 Weakness: Secondary | ICD-10-CM | POA: Diagnosis not present

## 2019-02-10 DIAGNOSIS — F419 Anxiety disorder, unspecified: Secondary | ICD-10-CM | POA: Diagnosis not present

## 2019-02-10 DIAGNOSIS — C3490 Malignant neoplasm of unspecified part of unspecified bronchus or lung: Secondary | ICD-10-CM | POA: Diagnosis not present

## 2019-02-10 DIAGNOSIS — F339 Major depressive disorder, recurrent, unspecified: Secondary | ICD-10-CM | POA: Diagnosis not present

## 2019-02-10 DIAGNOSIS — R112 Nausea with vomiting, unspecified: Secondary | ICD-10-CM | POA: Diagnosis not present

## 2019-02-10 DIAGNOSIS — R0602 Shortness of breath: Secondary | ICD-10-CM | POA: Diagnosis not present

## 2019-02-10 DIAGNOSIS — J449 Chronic obstructive pulmonary disease, unspecified: Secondary | ICD-10-CM | POA: Diagnosis not present

## 2019-02-10 DIAGNOSIS — R52 Pain, unspecified: Secondary | ICD-10-CM | POA: Diagnosis not present

## 2019-02-11 DIAGNOSIS — R531 Weakness: Secondary | ICD-10-CM | POA: Diagnosis not present

## 2019-02-11 DIAGNOSIS — F419 Anxiety disorder, unspecified: Secondary | ICD-10-CM | POA: Diagnosis not present

## 2019-02-11 DIAGNOSIS — C3490 Malignant neoplasm of unspecified part of unspecified bronchus or lung: Secondary | ICD-10-CM | POA: Diagnosis not present

## 2019-02-11 DIAGNOSIS — J449 Chronic obstructive pulmonary disease, unspecified: Secondary | ICD-10-CM | POA: Diagnosis not present

## 2019-02-11 DIAGNOSIS — K219 Gastro-esophageal reflux disease without esophagitis: Secondary | ICD-10-CM | POA: Diagnosis not present

## 2019-02-11 DIAGNOSIS — R52 Pain, unspecified: Secondary | ICD-10-CM | POA: Diagnosis not present

## 2019-02-11 DIAGNOSIS — R0602 Shortness of breath: Secondary | ICD-10-CM | POA: Diagnosis not present

## 2019-02-11 DIAGNOSIS — F339 Major depressive disorder, recurrent, unspecified: Secondary | ICD-10-CM | POA: Diagnosis not present

## 2019-02-11 DIAGNOSIS — R112 Nausea with vomiting, unspecified: Secondary | ICD-10-CM | POA: Diagnosis not present

## 2019-02-12 ENCOUNTER — Encounter: Payer: Self-pay | Admitting: Hematology

## 2019-02-12 DIAGNOSIS — F339 Major depressive disorder, recurrent, unspecified: Secondary | ICD-10-CM | POA: Diagnosis not present

## 2019-02-12 DIAGNOSIS — K219 Gastro-esophageal reflux disease without esophagitis: Secondary | ICD-10-CM | POA: Diagnosis not present

## 2019-02-12 DIAGNOSIS — C3490 Malignant neoplasm of unspecified part of unspecified bronchus or lung: Secondary | ICD-10-CM | POA: Diagnosis not present

## 2019-02-12 DIAGNOSIS — R52 Pain, unspecified: Secondary | ICD-10-CM | POA: Diagnosis not present

## 2019-02-12 DIAGNOSIS — R112 Nausea with vomiting, unspecified: Secondary | ICD-10-CM | POA: Diagnosis not present

## 2019-02-12 DIAGNOSIS — R531 Weakness: Secondary | ICD-10-CM | POA: Diagnosis not present

## 2019-02-12 DIAGNOSIS — R0602 Shortness of breath: Secondary | ICD-10-CM | POA: Diagnosis not present

## 2019-02-12 DIAGNOSIS — F419 Anxiety disorder, unspecified: Secondary | ICD-10-CM | POA: Diagnosis not present

## 2019-02-12 DIAGNOSIS — J449 Chronic obstructive pulmonary disease, unspecified: Secondary | ICD-10-CM | POA: Diagnosis not present

## 2019-02-13 ENCOUNTER — Other Ambulatory Visit: Payer: Self-pay | Admitting: Nurse Practitioner

## 2019-02-13 ENCOUNTER — Encounter: Payer: Self-pay | Admitting: Nurse Practitioner

## 2019-02-13 DIAGNOSIS — R112 Nausea with vomiting, unspecified: Secondary | ICD-10-CM | POA: Diagnosis not present

## 2019-02-13 DIAGNOSIS — R0602 Shortness of breath: Secondary | ICD-10-CM | POA: Diagnosis not present

## 2019-02-13 DIAGNOSIS — K219 Gastro-esophageal reflux disease without esophagitis: Secondary | ICD-10-CM | POA: Diagnosis not present

## 2019-02-13 DIAGNOSIS — R531 Weakness: Secondary | ICD-10-CM | POA: Diagnosis not present

## 2019-02-13 DIAGNOSIS — C3490 Malignant neoplasm of unspecified part of unspecified bronchus or lung: Secondary | ICD-10-CM | POA: Diagnosis not present

## 2019-02-13 DIAGNOSIS — F419 Anxiety disorder, unspecified: Secondary | ICD-10-CM | POA: Diagnosis not present

## 2019-02-13 DIAGNOSIS — J449 Chronic obstructive pulmonary disease, unspecified: Secondary | ICD-10-CM | POA: Diagnosis not present

## 2019-02-13 DIAGNOSIS — R52 Pain, unspecified: Secondary | ICD-10-CM | POA: Diagnosis not present

## 2019-02-13 DIAGNOSIS — F339 Major depressive disorder, recurrent, unspecified: Secondary | ICD-10-CM | POA: Diagnosis not present

## 2019-02-13 DIAGNOSIS — J9601 Acute respiratory failure with hypoxia: Secondary | ICD-10-CM

## 2019-02-13 MED ORDER — PREDNISONE 10 MG PO TABS: ORAL_TABLET | ORAL | 0 refills | Status: AC

## 2019-02-14 DIAGNOSIS — K219 Gastro-esophageal reflux disease without esophagitis: Secondary | ICD-10-CM | POA: Diagnosis not present

## 2019-02-14 DIAGNOSIS — R531 Weakness: Secondary | ICD-10-CM | POA: Diagnosis not present

## 2019-02-14 DIAGNOSIS — R0602 Shortness of breath: Secondary | ICD-10-CM | POA: Diagnosis not present

## 2019-02-14 DIAGNOSIS — F419 Anxiety disorder, unspecified: Secondary | ICD-10-CM | POA: Diagnosis not present

## 2019-02-14 DIAGNOSIS — R52 Pain, unspecified: Secondary | ICD-10-CM | POA: Diagnosis not present

## 2019-02-14 DIAGNOSIS — R112 Nausea with vomiting, unspecified: Secondary | ICD-10-CM | POA: Diagnosis not present

## 2019-02-14 DIAGNOSIS — F339 Major depressive disorder, recurrent, unspecified: Secondary | ICD-10-CM | POA: Diagnosis not present

## 2019-02-14 DIAGNOSIS — C3490 Malignant neoplasm of unspecified part of unspecified bronchus or lung: Secondary | ICD-10-CM | POA: Diagnosis not present

## 2019-02-14 DIAGNOSIS — J449 Chronic obstructive pulmonary disease, unspecified: Secondary | ICD-10-CM | POA: Diagnosis not present

## 2019-02-15 DIAGNOSIS — F339 Major depressive disorder, recurrent, unspecified: Secondary | ICD-10-CM | POA: Diagnosis not present

## 2019-02-15 DIAGNOSIS — F419 Anxiety disorder, unspecified: Secondary | ICD-10-CM | POA: Diagnosis not present

## 2019-02-15 DIAGNOSIS — J449 Chronic obstructive pulmonary disease, unspecified: Secondary | ICD-10-CM | POA: Diagnosis not present

## 2019-02-15 DIAGNOSIS — R52 Pain, unspecified: Secondary | ICD-10-CM | POA: Diagnosis not present

## 2019-02-15 DIAGNOSIS — C3490 Malignant neoplasm of unspecified part of unspecified bronchus or lung: Secondary | ICD-10-CM | POA: Diagnosis not present

## 2019-02-15 DIAGNOSIS — R112 Nausea with vomiting, unspecified: Secondary | ICD-10-CM | POA: Diagnosis not present

## 2019-02-15 DIAGNOSIS — R0602 Shortness of breath: Secondary | ICD-10-CM | POA: Diagnosis not present

## 2019-02-15 DIAGNOSIS — K219 Gastro-esophageal reflux disease without esophagitis: Secondary | ICD-10-CM | POA: Diagnosis not present

## 2019-02-15 DIAGNOSIS — R531 Weakness: Secondary | ICD-10-CM | POA: Diagnosis not present

## 2019-02-16 DIAGNOSIS — F419 Anxiety disorder, unspecified: Secondary | ICD-10-CM | POA: Diagnosis not present

## 2019-02-16 DIAGNOSIS — R52 Pain, unspecified: Secondary | ICD-10-CM | POA: Diagnosis not present

## 2019-02-16 DIAGNOSIS — C3490 Malignant neoplasm of unspecified part of unspecified bronchus or lung: Secondary | ICD-10-CM | POA: Diagnosis not present

## 2019-02-16 DIAGNOSIS — R112 Nausea with vomiting, unspecified: Secondary | ICD-10-CM | POA: Diagnosis not present

## 2019-02-16 DIAGNOSIS — R0602 Shortness of breath: Secondary | ICD-10-CM | POA: Diagnosis not present

## 2019-02-16 DIAGNOSIS — K219 Gastro-esophageal reflux disease without esophagitis: Secondary | ICD-10-CM | POA: Diagnosis not present

## 2019-02-16 DIAGNOSIS — F339 Major depressive disorder, recurrent, unspecified: Secondary | ICD-10-CM | POA: Diagnosis not present

## 2019-02-16 DIAGNOSIS — J449 Chronic obstructive pulmonary disease, unspecified: Secondary | ICD-10-CM | POA: Diagnosis not present

## 2019-02-16 DIAGNOSIS — R531 Weakness: Secondary | ICD-10-CM | POA: Diagnosis not present

## 2019-02-17 DIAGNOSIS — R112 Nausea with vomiting, unspecified: Secondary | ICD-10-CM | POA: Diagnosis not present

## 2019-02-17 DIAGNOSIS — F339 Major depressive disorder, recurrent, unspecified: Secondary | ICD-10-CM | POA: Diagnosis not present

## 2019-02-17 DIAGNOSIS — R0602 Shortness of breath: Secondary | ICD-10-CM | POA: Diagnosis not present

## 2019-02-17 DIAGNOSIS — C3490 Malignant neoplasm of unspecified part of unspecified bronchus or lung: Secondary | ICD-10-CM | POA: Diagnosis not present

## 2019-02-17 DIAGNOSIS — R531 Weakness: Secondary | ICD-10-CM | POA: Diagnosis not present

## 2019-02-17 DIAGNOSIS — R52 Pain, unspecified: Secondary | ICD-10-CM | POA: Diagnosis not present

## 2019-02-17 DIAGNOSIS — F419 Anxiety disorder, unspecified: Secondary | ICD-10-CM | POA: Diagnosis not present

## 2019-02-17 DIAGNOSIS — J449 Chronic obstructive pulmonary disease, unspecified: Secondary | ICD-10-CM | POA: Diagnosis not present

## 2019-02-17 DIAGNOSIS — K219 Gastro-esophageal reflux disease without esophagitis: Secondary | ICD-10-CM | POA: Diagnosis not present

## 2019-02-18 DIAGNOSIS — J449 Chronic obstructive pulmonary disease, unspecified: Secondary | ICD-10-CM | POA: Diagnosis not present

## 2019-02-18 DIAGNOSIS — R112 Nausea with vomiting, unspecified: Secondary | ICD-10-CM | POA: Diagnosis not present

## 2019-02-18 DIAGNOSIS — C3490 Malignant neoplasm of unspecified part of unspecified bronchus or lung: Secondary | ICD-10-CM | POA: Diagnosis not present

## 2019-02-18 DIAGNOSIS — F419 Anxiety disorder, unspecified: Secondary | ICD-10-CM | POA: Diagnosis not present

## 2019-02-18 DIAGNOSIS — F339 Major depressive disorder, recurrent, unspecified: Secondary | ICD-10-CM | POA: Diagnosis not present

## 2019-02-18 DIAGNOSIS — R531 Weakness: Secondary | ICD-10-CM | POA: Diagnosis not present

## 2019-02-18 DIAGNOSIS — R0602 Shortness of breath: Secondary | ICD-10-CM | POA: Diagnosis not present

## 2019-02-18 DIAGNOSIS — K219 Gastro-esophageal reflux disease without esophagitis: Secondary | ICD-10-CM | POA: Diagnosis not present

## 2019-02-18 DIAGNOSIS — R52 Pain, unspecified: Secondary | ICD-10-CM | POA: Diagnosis not present

## 2019-02-19 DIAGNOSIS — R0602 Shortness of breath: Secondary | ICD-10-CM | POA: Diagnosis not present

## 2019-02-19 DIAGNOSIS — R112 Nausea with vomiting, unspecified: Secondary | ICD-10-CM | POA: Diagnosis not present

## 2019-02-19 DIAGNOSIS — R531 Weakness: Secondary | ICD-10-CM | POA: Diagnosis not present

## 2019-02-19 DIAGNOSIS — C3490 Malignant neoplasm of unspecified part of unspecified bronchus or lung: Secondary | ICD-10-CM | POA: Diagnosis not present

## 2019-02-19 DIAGNOSIS — F339 Major depressive disorder, recurrent, unspecified: Secondary | ICD-10-CM | POA: Diagnosis not present

## 2019-02-19 DIAGNOSIS — F419 Anxiety disorder, unspecified: Secondary | ICD-10-CM | POA: Diagnosis not present

## 2019-02-19 DIAGNOSIS — K219 Gastro-esophageal reflux disease without esophagitis: Secondary | ICD-10-CM | POA: Diagnosis not present

## 2019-02-19 DIAGNOSIS — R52 Pain, unspecified: Secondary | ICD-10-CM | POA: Diagnosis not present

## 2019-02-19 DIAGNOSIS — J449 Chronic obstructive pulmonary disease, unspecified: Secondary | ICD-10-CM | POA: Diagnosis not present

## 2019-02-20 ENCOUNTER — Telehealth: Payer: Self-pay

## 2019-02-20 DIAGNOSIS — R52 Pain, unspecified: Secondary | ICD-10-CM | POA: Diagnosis not present

## 2019-02-20 DIAGNOSIS — R531 Weakness: Secondary | ICD-10-CM | POA: Diagnosis not present

## 2019-02-20 DIAGNOSIS — R0602 Shortness of breath: Secondary | ICD-10-CM | POA: Diagnosis not present

## 2019-02-20 DIAGNOSIS — K219 Gastro-esophageal reflux disease without esophagitis: Secondary | ICD-10-CM | POA: Diagnosis not present

## 2019-02-20 DIAGNOSIS — C3490 Malignant neoplasm of unspecified part of unspecified bronchus or lung: Secondary | ICD-10-CM | POA: Diagnosis not present

## 2019-02-20 DIAGNOSIS — F419 Anxiety disorder, unspecified: Secondary | ICD-10-CM | POA: Diagnosis not present

## 2019-02-20 DIAGNOSIS — R112 Nausea with vomiting, unspecified: Secondary | ICD-10-CM | POA: Diagnosis not present

## 2019-02-20 DIAGNOSIS — J449 Chronic obstructive pulmonary disease, unspecified: Secondary | ICD-10-CM | POA: Diagnosis not present

## 2019-02-20 NOTE — Telephone Encounter (Signed)
Faxed signed orders to [facility] [phone], received confirmation fax went through, sent to HIM for scanning to chart.

## 2019-02-21 DIAGNOSIS — K219 Gastro-esophageal reflux disease without esophagitis: Secondary | ICD-10-CM | POA: Diagnosis not present

## 2019-02-21 DIAGNOSIS — R531 Weakness: Secondary | ICD-10-CM | POA: Diagnosis not present

## 2019-02-21 DIAGNOSIS — R0602 Shortness of breath: Secondary | ICD-10-CM | POA: Diagnosis not present

## 2019-02-21 DIAGNOSIS — F419 Anxiety disorder, unspecified: Secondary | ICD-10-CM | POA: Diagnosis not present

## 2019-02-21 DIAGNOSIS — R112 Nausea with vomiting, unspecified: Secondary | ICD-10-CM | POA: Diagnosis not present

## 2019-02-21 DIAGNOSIS — J449 Chronic obstructive pulmonary disease, unspecified: Secondary | ICD-10-CM | POA: Diagnosis not present

## 2019-02-21 DIAGNOSIS — C3490 Malignant neoplasm of unspecified part of unspecified bronchus or lung: Secondary | ICD-10-CM | POA: Diagnosis not present

## 2019-02-21 DIAGNOSIS — R52 Pain, unspecified: Secondary | ICD-10-CM | POA: Diagnosis not present

## 2019-02-22 DIAGNOSIS — R0602 Shortness of breath: Secondary | ICD-10-CM | POA: Diagnosis not present

## 2019-02-22 DIAGNOSIS — K219 Gastro-esophageal reflux disease without esophagitis: Secondary | ICD-10-CM | POA: Diagnosis not present

## 2019-02-22 DIAGNOSIS — R112 Nausea with vomiting, unspecified: Secondary | ICD-10-CM | POA: Diagnosis not present

## 2019-02-22 DIAGNOSIS — R531 Weakness: Secondary | ICD-10-CM | POA: Diagnosis not present

## 2019-02-22 DIAGNOSIS — F419 Anxiety disorder, unspecified: Secondary | ICD-10-CM | POA: Diagnosis not present

## 2019-02-22 DIAGNOSIS — J449 Chronic obstructive pulmonary disease, unspecified: Secondary | ICD-10-CM | POA: Diagnosis not present

## 2019-02-22 DIAGNOSIS — C3490 Malignant neoplasm of unspecified part of unspecified bronchus or lung: Secondary | ICD-10-CM | POA: Diagnosis not present

## 2019-02-22 DIAGNOSIS — R52 Pain, unspecified: Secondary | ICD-10-CM | POA: Diagnosis not present

## 2019-02-23 ENCOUNTER — Telehealth: Payer: Self-pay

## 2019-02-23 DIAGNOSIS — R0602 Shortness of breath: Secondary | ICD-10-CM | POA: Diagnosis not present

## 2019-02-23 DIAGNOSIS — R112 Nausea with vomiting, unspecified: Secondary | ICD-10-CM | POA: Diagnosis not present

## 2019-02-23 DIAGNOSIS — J449 Chronic obstructive pulmonary disease, unspecified: Secondary | ICD-10-CM | POA: Diagnosis not present

## 2019-02-23 DIAGNOSIS — R52 Pain, unspecified: Secondary | ICD-10-CM | POA: Diagnosis not present

## 2019-02-23 DIAGNOSIS — C3490 Malignant neoplasm of unspecified part of unspecified bronchus or lung: Secondary | ICD-10-CM | POA: Diagnosis not present

## 2019-02-23 DIAGNOSIS — F419 Anxiety disorder, unspecified: Secondary | ICD-10-CM | POA: Diagnosis not present

## 2019-02-23 DIAGNOSIS — R531 Weakness: Secondary | ICD-10-CM | POA: Diagnosis not present

## 2019-02-23 DIAGNOSIS — K219 Gastro-esophageal reflux disease without esophagitis: Secondary | ICD-10-CM | POA: Diagnosis not present

## 2019-02-23 NOTE — Telephone Encounter (Signed)
Faxed signed order back to [facility].

## 2019-02-24 DIAGNOSIS — J449 Chronic obstructive pulmonary disease, unspecified: Secondary | ICD-10-CM | POA: Diagnosis not present

## 2019-02-24 DIAGNOSIS — R531 Weakness: Secondary | ICD-10-CM | POA: Diagnosis not present

## 2019-02-24 DIAGNOSIS — R52 Pain, unspecified: Secondary | ICD-10-CM | POA: Diagnosis not present

## 2019-02-24 DIAGNOSIS — F419 Anxiety disorder, unspecified: Secondary | ICD-10-CM | POA: Diagnosis not present

## 2019-02-24 DIAGNOSIS — R112 Nausea with vomiting, unspecified: Secondary | ICD-10-CM | POA: Diagnosis not present

## 2019-02-24 DIAGNOSIS — K219 Gastro-esophageal reflux disease without esophagitis: Secondary | ICD-10-CM | POA: Diagnosis not present

## 2019-02-24 DIAGNOSIS — R0602 Shortness of breath: Secondary | ICD-10-CM | POA: Diagnosis not present

## 2019-02-24 DIAGNOSIS — C3490 Malignant neoplasm of unspecified part of unspecified bronchus or lung: Secondary | ICD-10-CM | POA: Diagnosis not present

## 2019-02-25 DIAGNOSIS — R52 Pain, unspecified: Secondary | ICD-10-CM | POA: Diagnosis not present

## 2019-02-25 DIAGNOSIS — R112 Nausea with vomiting, unspecified: Secondary | ICD-10-CM | POA: Diagnosis not present

## 2019-02-25 DIAGNOSIS — K219 Gastro-esophageal reflux disease without esophagitis: Secondary | ICD-10-CM | POA: Diagnosis not present

## 2019-02-25 DIAGNOSIS — F419 Anxiety disorder, unspecified: Secondary | ICD-10-CM | POA: Diagnosis not present

## 2019-02-25 DIAGNOSIS — R531 Weakness: Secondary | ICD-10-CM | POA: Diagnosis not present

## 2019-02-25 DIAGNOSIS — J449 Chronic obstructive pulmonary disease, unspecified: Secondary | ICD-10-CM | POA: Diagnosis not present

## 2019-02-25 DIAGNOSIS — C3490 Malignant neoplasm of unspecified part of unspecified bronchus or lung: Secondary | ICD-10-CM | POA: Diagnosis not present

## 2019-02-25 DIAGNOSIS — R0602 Shortness of breath: Secondary | ICD-10-CM | POA: Diagnosis not present

## 2019-02-26 DIAGNOSIS — R112 Nausea with vomiting, unspecified: Secondary | ICD-10-CM | POA: Diagnosis not present

## 2019-02-26 DIAGNOSIS — C3490 Malignant neoplasm of unspecified part of unspecified bronchus or lung: Secondary | ICD-10-CM | POA: Diagnosis not present

## 2019-02-26 DIAGNOSIS — R52 Pain, unspecified: Secondary | ICD-10-CM | POA: Diagnosis not present

## 2019-02-26 DIAGNOSIS — J449 Chronic obstructive pulmonary disease, unspecified: Secondary | ICD-10-CM | POA: Diagnosis not present

## 2019-02-26 DIAGNOSIS — R0602 Shortness of breath: Secondary | ICD-10-CM | POA: Diagnosis not present

## 2019-02-26 DIAGNOSIS — K219 Gastro-esophageal reflux disease without esophagitis: Secondary | ICD-10-CM | POA: Diagnosis not present

## 2019-02-26 DIAGNOSIS — R531 Weakness: Secondary | ICD-10-CM | POA: Diagnosis not present

## 2019-02-26 DIAGNOSIS — F419 Anxiety disorder, unspecified: Secondary | ICD-10-CM | POA: Diagnosis not present

## 2019-02-27 DIAGNOSIS — R112 Nausea with vomiting, unspecified: Secondary | ICD-10-CM | POA: Diagnosis not present

## 2019-02-27 DIAGNOSIS — R52 Pain, unspecified: Secondary | ICD-10-CM | POA: Diagnosis not present

## 2019-02-27 DIAGNOSIS — J449 Chronic obstructive pulmonary disease, unspecified: Secondary | ICD-10-CM | POA: Diagnosis not present

## 2019-02-27 DIAGNOSIS — K219 Gastro-esophageal reflux disease without esophagitis: Secondary | ICD-10-CM | POA: Diagnosis not present

## 2019-02-27 DIAGNOSIS — R531 Weakness: Secondary | ICD-10-CM | POA: Diagnosis not present

## 2019-02-27 DIAGNOSIS — F419 Anxiety disorder, unspecified: Secondary | ICD-10-CM | POA: Diagnosis not present

## 2019-02-27 DIAGNOSIS — R0602 Shortness of breath: Secondary | ICD-10-CM | POA: Diagnosis not present

## 2019-02-27 DIAGNOSIS — C3490 Malignant neoplasm of unspecified part of unspecified bronchus or lung: Secondary | ICD-10-CM | POA: Diagnosis not present

## 2019-02-28 DIAGNOSIS — K219 Gastro-esophageal reflux disease without esophagitis: Secondary | ICD-10-CM | POA: Diagnosis not present

## 2019-02-28 DIAGNOSIS — J449 Chronic obstructive pulmonary disease, unspecified: Secondary | ICD-10-CM | POA: Diagnosis not present

## 2019-02-28 DIAGNOSIS — R52 Pain, unspecified: Secondary | ICD-10-CM | POA: Diagnosis not present

## 2019-02-28 DIAGNOSIS — F419 Anxiety disorder, unspecified: Secondary | ICD-10-CM | POA: Diagnosis not present

## 2019-02-28 DIAGNOSIS — C3490 Malignant neoplasm of unspecified part of unspecified bronchus or lung: Secondary | ICD-10-CM | POA: Diagnosis not present

## 2019-02-28 DIAGNOSIS — R531 Weakness: Secondary | ICD-10-CM | POA: Diagnosis not present

## 2019-02-28 DIAGNOSIS — R0602 Shortness of breath: Secondary | ICD-10-CM | POA: Diagnosis not present

## 2019-02-28 DIAGNOSIS — R112 Nausea with vomiting, unspecified: Secondary | ICD-10-CM | POA: Diagnosis not present

## 2019-03-01 DIAGNOSIS — F419 Anxiety disorder, unspecified: Secondary | ICD-10-CM | POA: Diagnosis not present

## 2019-03-01 DIAGNOSIS — R112 Nausea with vomiting, unspecified: Secondary | ICD-10-CM | POA: Diagnosis not present

## 2019-03-01 DIAGNOSIS — R0602 Shortness of breath: Secondary | ICD-10-CM | POA: Diagnosis not present

## 2019-03-01 DIAGNOSIS — C3490 Malignant neoplasm of unspecified part of unspecified bronchus or lung: Secondary | ICD-10-CM | POA: Diagnosis not present

## 2019-03-01 DIAGNOSIS — K219 Gastro-esophageal reflux disease without esophagitis: Secondary | ICD-10-CM | POA: Diagnosis not present

## 2019-03-01 DIAGNOSIS — R52 Pain, unspecified: Secondary | ICD-10-CM | POA: Diagnosis not present

## 2019-03-01 DIAGNOSIS — J449 Chronic obstructive pulmonary disease, unspecified: Secondary | ICD-10-CM | POA: Diagnosis not present

## 2019-03-01 DIAGNOSIS — R531 Weakness: Secondary | ICD-10-CM | POA: Diagnosis not present

## 2019-03-02 DIAGNOSIS — R531 Weakness: Secondary | ICD-10-CM | POA: Diagnosis not present

## 2019-03-02 DIAGNOSIS — R52 Pain, unspecified: Secondary | ICD-10-CM | POA: Diagnosis not present

## 2019-03-02 DIAGNOSIS — K219 Gastro-esophageal reflux disease without esophagitis: Secondary | ICD-10-CM | POA: Diagnosis not present

## 2019-03-02 DIAGNOSIS — F419 Anxiety disorder, unspecified: Secondary | ICD-10-CM | POA: Diagnosis not present

## 2019-03-02 DIAGNOSIS — C3490 Malignant neoplasm of unspecified part of unspecified bronchus or lung: Secondary | ICD-10-CM | POA: Diagnosis not present

## 2019-03-02 DIAGNOSIS — J449 Chronic obstructive pulmonary disease, unspecified: Secondary | ICD-10-CM | POA: Diagnosis not present

## 2019-03-02 DIAGNOSIS — R0602 Shortness of breath: Secondary | ICD-10-CM | POA: Diagnosis not present

## 2019-03-02 DIAGNOSIS — R112 Nausea with vomiting, unspecified: Secondary | ICD-10-CM | POA: Diagnosis not present

## 2019-03-03 DIAGNOSIS — R112 Nausea with vomiting, unspecified: Secondary | ICD-10-CM | POA: Diagnosis not present

## 2019-03-03 DIAGNOSIS — F419 Anxiety disorder, unspecified: Secondary | ICD-10-CM | POA: Diagnosis not present

## 2019-03-03 DIAGNOSIS — C3490 Malignant neoplasm of unspecified part of unspecified bronchus or lung: Secondary | ICD-10-CM | POA: Diagnosis not present

## 2019-03-03 DIAGNOSIS — R52 Pain, unspecified: Secondary | ICD-10-CM | POA: Diagnosis not present

## 2019-03-03 DIAGNOSIS — J449 Chronic obstructive pulmonary disease, unspecified: Secondary | ICD-10-CM | POA: Diagnosis not present

## 2019-03-03 DIAGNOSIS — R531 Weakness: Secondary | ICD-10-CM | POA: Diagnosis not present

## 2019-03-03 DIAGNOSIS — K219 Gastro-esophageal reflux disease without esophagitis: Secondary | ICD-10-CM | POA: Diagnosis not present

## 2019-03-03 DIAGNOSIS — R0602 Shortness of breath: Secondary | ICD-10-CM | POA: Diagnosis not present

## 2019-03-04 DIAGNOSIS — R531 Weakness: Secondary | ICD-10-CM | POA: Diagnosis not present

## 2019-03-04 DIAGNOSIS — R112 Nausea with vomiting, unspecified: Secondary | ICD-10-CM | POA: Diagnosis not present

## 2019-03-04 DIAGNOSIS — R0602 Shortness of breath: Secondary | ICD-10-CM | POA: Diagnosis not present

## 2019-03-04 DIAGNOSIS — R52 Pain, unspecified: Secondary | ICD-10-CM | POA: Diagnosis not present

## 2019-03-04 DIAGNOSIS — J449 Chronic obstructive pulmonary disease, unspecified: Secondary | ICD-10-CM | POA: Diagnosis not present

## 2019-03-04 DIAGNOSIS — F419 Anxiety disorder, unspecified: Secondary | ICD-10-CM | POA: Diagnosis not present

## 2019-03-04 DIAGNOSIS — C3490 Malignant neoplasm of unspecified part of unspecified bronchus or lung: Secondary | ICD-10-CM | POA: Diagnosis not present

## 2019-03-04 DIAGNOSIS — K219 Gastro-esophageal reflux disease without esophagitis: Secondary | ICD-10-CM | POA: Diagnosis not present

## 2019-03-05 DIAGNOSIS — K219 Gastro-esophageal reflux disease without esophagitis: Secondary | ICD-10-CM | POA: Diagnosis not present

## 2019-03-05 DIAGNOSIS — J449 Chronic obstructive pulmonary disease, unspecified: Secondary | ICD-10-CM | POA: Diagnosis not present

## 2019-03-05 DIAGNOSIS — R52 Pain, unspecified: Secondary | ICD-10-CM | POA: Diagnosis not present

## 2019-03-05 DIAGNOSIS — C3490 Malignant neoplasm of unspecified part of unspecified bronchus or lung: Secondary | ICD-10-CM | POA: Diagnosis not present

## 2019-03-05 DIAGNOSIS — R112 Nausea with vomiting, unspecified: Secondary | ICD-10-CM | POA: Diagnosis not present

## 2019-03-05 DIAGNOSIS — R531 Weakness: Secondary | ICD-10-CM | POA: Diagnosis not present

## 2019-03-05 DIAGNOSIS — R0602 Shortness of breath: Secondary | ICD-10-CM | POA: Diagnosis not present

## 2019-03-05 DIAGNOSIS — F419 Anxiety disorder, unspecified: Secondary | ICD-10-CM | POA: Diagnosis not present

## 2019-03-06 DIAGNOSIS — R531 Weakness: Secondary | ICD-10-CM | POA: Diagnosis not present

## 2019-03-06 DIAGNOSIS — F419 Anxiety disorder, unspecified: Secondary | ICD-10-CM | POA: Diagnosis not present

## 2019-03-06 DIAGNOSIS — C3490 Malignant neoplasm of unspecified part of unspecified bronchus or lung: Secondary | ICD-10-CM | POA: Diagnosis not present

## 2019-03-06 DIAGNOSIS — R112 Nausea with vomiting, unspecified: Secondary | ICD-10-CM | POA: Diagnosis not present

## 2019-03-06 DIAGNOSIS — K219 Gastro-esophageal reflux disease without esophagitis: Secondary | ICD-10-CM | POA: Diagnosis not present

## 2019-03-06 DIAGNOSIS — J449 Chronic obstructive pulmonary disease, unspecified: Secondary | ICD-10-CM | POA: Diagnosis not present

## 2019-03-06 DIAGNOSIS — R52 Pain, unspecified: Secondary | ICD-10-CM | POA: Diagnosis not present

## 2019-03-06 DIAGNOSIS — R0602 Shortness of breath: Secondary | ICD-10-CM | POA: Diagnosis not present

## 2019-03-07 DIAGNOSIS — F419 Anxiety disorder, unspecified: Secondary | ICD-10-CM | POA: Diagnosis not present

## 2019-03-07 DIAGNOSIS — J449 Chronic obstructive pulmonary disease, unspecified: Secondary | ICD-10-CM | POA: Diagnosis not present

## 2019-03-07 DIAGNOSIS — R0602 Shortness of breath: Secondary | ICD-10-CM | POA: Diagnosis not present

## 2019-03-07 DIAGNOSIS — R52 Pain, unspecified: Secondary | ICD-10-CM | POA: Diagnosis not present

## 2019-03-07 DIAGNOSIS — K219 Gastro-esophageal reflux disease without esophagitis: Secondary | ICD-10-CM | POA: Diagnosis not present

## 2019-03-07 DIAGNOSIS — C3490 Malignant neoplasm of unspecified part of unspecified bronchus or lung: Secondary | ICD-10-CM | POA: Diagnosis not present

## 2019-03-07 DIAGNOSIS — R112 Nausea with vomiting, unspecified: Secondary | ICD-10-CM | POA: Diagnosis not present

## 2019-03-07 DIAGNOSIS — R531 Weakness: Secondary | ICD-10-CM | POA: Diagnosis not present

## 2019-03-08 DIAGNOSIS — F419 Anxiety disorder, unspecified: Secondary | ICD-10-CM | POA: Diagnosis not present

## 2019-03-08 DIAGNOSIS — J449 Chronic obstructive pulmonary disease, unspecified: Secondary | ICD-10-CM | POA: Diagnosis not present

## 2019-03-08 DIAGNOSIS — C3490 Malignant neoplasm of unspecified part of unspecified bronchus or lung: Secondary | ICD-10-CM | POA: Diagnosis not present

## 2019-03-08 DIAGNOSIS — R0602 Shortness of breath: Secondary | ICD-10-CM | POA: Diagnosis not present

## 2019-03-08 DIAGNOSIS — R112 Nausea with vomiting, unspecified: Secondary | ICD-10-CM | POA: Diagnosis not present

## 2019-03-08 DIAGNOSIS — K219 Gastro-esophageal reflux disease without esophagitis: Secondary | ICD-10-CM | POA: Diagnosis not present

## 2019-03-08 DIAGNOSIS — R52 Pain, unspecified: Secondary | ICD-10-CM | POA: Diagnosis not present

## 2019-03-08 DIAGNOSIS — R531 Weakness: Secondary | ICD-10-CM | POA: Diagnosis not present

## 2019-03-09 DIAGNOSIS — C3490 Malignant neoplasm of unspecified part of unspecified bronchus or lung: Secondary | ICD-10-CM | POA: Diagnosis not present

## 2019-03-09 DIAGNOSIS — R112 Nausea with vomiting, unspecified: Secondary | ICD-10-CM | POA: Diagnosis not present

## 2019-03-09 DIAGNOSIS — R531 Weakness: Secondary | ICD-10-CM | POA: Diagnosis not present

## 2019-03-09 DIAGNOSIS — J449 Chronic obstructive pulmonary disease, unspecified: Secondary | ICD-10-CM | POA: Diagnosis not present

## 2019-03-09 DIAGNOSIS — R52 Pain, unspecified: Secondary | ICD-10-CM | POA: Diagnosis not present

## 2019-03-09 DIAGNOSIS — F419 Anxiety disorder, unspecified: Secondary | ICD-10-CM | POA: Diagnosis not present

## 2019-03-09 DIAGNOSIS — R0602 Shortness of breath: Secondary | ICD-10-CM | POA: Diagnosis not present

## 2019-03-09 DIAGNOSIS — K219 Gastro-esophageal reflux disease without esophagitis: Secondary | ICD-10-CM | POA: Diagnosis not present

## 2019-03-10 DIAGNOSIS — K219 Gastro-esophageal reflux disease without esophagitis: Secondary | ICD-10-CM | POA: Diagnosis not present

## 2019-03-10 DIAGNOSIS — F419 Anxiety disorder, unspecified: Secondary | ICD-10-CM | POA: Diagnosis not present

## 2019-03-10 DIAGNOSIS — R0602 Shortness of breath: Secondary | ICD-10-CM | POA: Diagnosis not present

## 2019-03-10 DIAGNOSIS — R52 Pain, unspecified: Secondary | ICD-10-CM | POA: Diagnosis not present

## 2019-03-10 DIAGNOSIS — R531 Weakness: Secondary | ICD-10-CM | POA: Diagnosis not present

## 2019-03-10 DIAGNOSIS — C3490 Malignant neoplasm of unspecified part of unspecified bronchus or lung: Secondary | ICD-10-CM | POA: Diagnosis not present

## 2019-03-10 DIAGNOSIS — J449 Chronic obstructive pulmonary disease, unspecified: Secondary | ICD-10-CM | POA: Diagnosis not present

## 2019-03-10 DIAGNOSIS — R112 Nausea with vomiting, unspecified: Secondary | ICD-10-CM | POA: Diagnosis not present

## 2019-03-11 DIAGNOSIS — J449 Chronic obstructive pulmonary disease, unspecified: Secondary | ICD-10-CM | POA: Diagnosis not present

## 2019-03-11 DIAGNOSIS — K219 Gastro-esophageal reflux disease without esophagitis: Secondary | ICD-10-CM | POA: Diagnosis not present

## 2019-03-11 DIAGNOSIS — R0602 Shortness of breath: Secondary | ICD-10-CM | POA: Diagnosis not present

## 2019-03-11 DIAGNOSIS — R531 Weakness: Secondary | ICD-10-CM | POA: Diagnosis not present

## 2019-03-11 DIAGNOSIS — C3490 Malignant neoplasm of unspecified part of unspecified bronchus or lung: Secondary | ICD-10-CM | POA: Diagnosis not present

## 2019-03-11 DIAGNOSIS — R112 Nausea with vomiting, unspecified: Secondary | ICD-10-CM | POA: Diagnosis not present

## 2019-03-11 DIAGNOSIS — F419 Anxiety disorder, unspecified: Secondary | ICD-10-CM | POA: Diagnosis not present

## 2019-03-11 DIAGNOSIS — R52 Pain, unspecified: Secondary | ICD-10-CM | POA: Diagnosis not present

## 2019-03-12 DIAGNOSIS — R52 Pain, unspecified: Secondary | ICD-10-CM | POA: Diagnosis not present

## 2019-03-12 DIAGNOSIS — M159 Polyosteoarthritis, unspecified: Secondary | ICD-10-CM | POA: Diagnosis not present

## 2019-03-12 DIAGNOSIS — R112 Nausea with vomiting, unspecified: Secondary | ICD-10-CM | POA: Diagnosis not present

## 2019-03-12 DIAGNOSIS — M5136 Other intervertebral disc degeneration, lumbar region: Secondary | ICD-10-CM | POA: Diagnosis not present

## 2019-03-12 DIAGNOSIS — R531 Weakness: Secondary | ICD-10-CM | POA: Diagnosis not present

## 2019-03-12 DIAGNOSIS — C3490 Malignant neoplasm of unspecified part of unspecified bronchus or lung: Secondary | ICD-10-CM | POA: Diagnosis not present

## 2019-03-12 DIAGNOSIS — G894 Chronic pain syndrome: Secondary | ICD-10-CM | POA: Diagnosis not present

## 2019-03-12 DIAGNOSIS — J449 Chronic obstructive pulmonary disease, unspecified: Secondary | ICD-10-CM | POA: Diagnosis not present

## 2019-03-12 DIAGNOSIS — K219 Gastro-esophageal reflux disease without esophagitis: Secondary | ICD-10-CM | POA: Diagnosis not present

## 2019-03-12 DIAGNOSIS — R0602 Shortness of breath: Secondary | ICD-10-CM | POA: Diagnosis not present

## 2019-03-12 DIAGNOSIS — F419 Anxiety disorder, unspecified: Secondary | ICD-10-CM | POA: Diagnosis not present

## 2019-03-13 DIAGNOSIS — C3490 Malignant neoplasm of unspecified part of unspecified bronchus or lung: Secondary | ICD-10-CM | POA: Diagnosis not present

## 2019-03-13 DIAGNOSIS — K219 Gastro-esophageal reflux disease without esophagitis: Secondary | ICD-10-CM | POA: Diagnosis not present

## 2019-03-13 DIAGNOSIS — R52 Pain, unspecified: Secondary | ICD-10-CM | POA: Diagnosis not present

## 2019-03-13 DIAGNOSIS — J449 Chronic obstructive pulmonary disease, unspecified: Secondary | ICD-10-CM | POA: Diagnosis not present

## 2019-03-13 DIAGNOSIS — R112 Nausea with vomiting, unspecified: Secondary | ICD-10-CM | POA: Diagnosis not present

## 2019-03-13 DIAGNOSIS — R0602 Shortness of breath: Secondary | ICD-10-CM | POA: Diagnosis not present

## 2019-03-13 DIAGNOSIS — R531 Weakness: Secondary | ICD-10-CM | POA: Diagnosis not present

## 2019-03-13 DIAGNOSIS — F419 Anxiety disorder, unspecified: Secondary | ICD-10-CM | POA: Diagnosis not present

## 2019-03-14 DIAGNOSIS — K219 Gastro-esophageal reflux disease without esophagitis: Secondary | ICD-10-CM | POA: Diagnosis not present

## 2019-03-14 DIAGNOSIS — J449 Chronic obstructive pulmonary disease, unspecified: Secondary | ICD-10-CM | POA: Diagnosis not present

## 2019-03-14 DIAGNOSIS — F419 Anxiety disorder, unspecified: Secondary | ICD-10-CM | POA: Diagnosis not present

## 2019-03-14 DIAGNOSIS — R0602 Shortness of breath: Secondary | ICD-10-CM | POA: Diagnosis not present

## 2019-03-14 DIAGNOSIS — R52 Pain, unspecified: Secondary | ICD-10-CM | POA: Diagnosis not present

## 2019-03-14 DIAGNOSIS — R531 Weakness: Secondary | ICD-10-CM | POA: Diagnosis not present

## 2019-03-14 DIAGNOSIS — R112 Nausea with vomiting, unspecified: Secondary | ICD-10-CM | POA: Diagnosis not present

## 2019-03-14 DIAGNOSIS — C3490 Malignant neoplasm of unspecified part of unspecified bronchus or lung: Secondary | ICD-10-CM | POA: Diagnosis not present

## 2019-03-15 DIAGNOSIS — K219 Gastro-esophageal reflux disease without esophagitis: Secondary | ICD-10-CM | POA: Diagnosis not present

## 2019-03-15 DIAGNOSIS — J449 Chronic obstructive pulmonary disease, unspecified: Secondary | ICD-10-CM | POA: Diagnosis not present

## 2019-03-15 DIAGNOSIS — C3490 Malignant neoplasm of unspecified part of unspecified bronchus or lung: Secondary | ICD-10-CM | POA: Diagnosis not present

## 2019-03-15 DIAGNOSIS — F419 Anxiety disorder, unspecified: Secondary | ICD-10-CM | POA: Diagnosis not present

## 2019-03-15 DIAGNOSIS — R531 Weakness: Secondary | ICD-10-CM | POA: Diagnosis not present

## 2019-03-15 DIAGNOSIS — R112 Nausea with vomiting, unspecified: Secondary | ICD-10-CM | POA: Diagnosis not present

## 2019-03-15 DIAGNOSIS — R52 Pain, unspecified: Secondary | ICD-10-CM | POA: Diagnosis not present

## 2019-03-15 DIAGNOSIS — R0602 Shortness of breath: Secondary | ICD-10-CM | POA: Diagnosis not present

## 2019-03-16 DIAGNOSIS — R52 Pain, unspecified: Secondary | ICD-10-CM | POA: Diagnosis not present

## 2019-03-16 DIAGNOSIS — F419 Anxiety disorder, unspecified: Secondary | ICD-10-CM | POA: Diagnosis not present

## 2019-03-16 DIAGNOSIS — R0602 Shortness of breath: Secondary | ICD-10-CM | POA: Diagnosis not present

## 2019-03-16 DIAGNOSIS — C3490 Malignant neoplasm of unspecified part of unspecified bronchus or lung: Secondary | ICD-10-CM | POA: Diagnosis not present

## 2019-03-16 DIAGNOSIS — R531 Weakness: Secondary | ICD-10-CM | POA: Diagnosis not present

## 2019-03-16 DIAGNOSIS — K219 Gastro-esophageal reflux disease without esophagitis: Secondary | ICD-10-CM | POA: Diagnosis not present

## 2019-03-16 DIAGNOSIS — J449 Chronic obstructive pulmonary disease, unspecified: Secondary | ICD-10-CM | POA: Diagnosis not present

## 2019-03-16 DIAGNOSIS — R112 Nausea with vomiting, unspecified: Secondary | ICD-10-CM | POA: Diagnosis not present

## 2019-03-17 DIAGNOSIS — R52 Pain, unspecified: Secondary | ICD-10-CM | POA: Diagnosis not present

## 2019-03-17 DIAGNOSIS — R112 Nausea with vomiting, unspecified: Secondary | ICD-10-CM | POA: Diagnosis not present

## 2019-03-17 DIAGNOSIS — F419 Anxiety disorder, unspecified: Secondary | ICD-10-CM | POA: Diagnosis not present

## 2019-03-17 DIAGNOSIS — C3490 Malignant neoplasm of unspecified part of unspecified bronchus or lung: Secondary | ICD-10-CM | POA: Diagnosis not present

## 2019-03-17 DIAGNOSIS — R531 Weakness: Secondary | ICD-10-CM | POA: Diagnosis not present

## 2019-03-17 DIAGNOSIS — J449 Chronic obstructive pulmonary disease, unspecified: Secondary | ICD-10-CM | POA: Diagnosis not present

## 2019-03-17 DIAGNOSIS — K219 Gastro-esophageal reflux disease without esophagitis: Secondary | ICD-10-CM | POA: Diagnosis not present

## 2019-03-17 DIAGNOSIS — R0602 Shortness of breath: Secondary | ICD-10-CM | POA: Diagnosis not present

## 2019-03-18 DIAGNOSIS — R112 Nausea with vomiting, unspecified: Secondary | ICD-10-CM | POA: Diagnosis not present

## 2019-03-18 DIAGNOSIS — R531 Weakness: Secondary | ICD-10-CM | POA: Diagnosis not present

## 2019-03-18 DIAGNOSIS — F419 Anxiety disorder, unspecified: Secondary | ICD-10-CM | POA: Diagnosis not present

## 2019-03-18 DIAGNOSIS — R52 Pain, unspecified: Secondary | ICD-10-CM | POA: Diagnosis not present

## 2019-03-18 DIAGNOSIS — R0602 Shortness of breath: Secondary | ICD-10-CM | POA: Diagnosis not present

## 2019-03-18 DIAGNOSIS — C3490 Malignant neoplasm of unspecified part of unspecified bronchus or lung: Secondary | ICD-10-CM | POA: Diagnosis not present

## 2019-03-18 DIAGNOSIS — J449 Chronic obstructive pulmonary disease, unspecified: Secondary | ICD-10-CM | POA: Diagnosis not present

## 2019-03-18 DIAGNOSIS — K219 Gastro-esophageal reflux disease without esophagitis: Secondary | ICD-10-CM | POA: Diagnosis not present

## 2019-03-19 DIAGNOSIS — C3490 Malignant neoplasm of unspecified part of unspecified bronchus or lung: Secondary | ICD-10-CM | POA: Diagnosis not present

## 2019-03-19 DIAGNOSIS — R531 Weakness: Secondary | ICD-10-CM | POA: Diagnosis not present

## 2019-03-19 DIAGNOSIS — R52 Pain, unspecified: Secondary | ICD-10-CM | POA: Diagnosis not present

## 2019-03-19 DIAGNOSIS — R0602 Shortness of breath: Secondary | ICD-10-CM | POA: Diagnosis not present

## 2019-03-19 DIAGNOSIS — J449 Chronic obstructive pulmonary disease, unspecified: Secondary | ICD-10-CM | POA: Diagnosis not present

## 2019-03-19 DIAGNOSIS — F419 Anxiety disorder, unspecified: Secondary | ICD-10-CM | POA: Diagnosis not present

## 2019-03-19 DIAGNOSIS — K219 Gastro-esophageal reflux disease without esophagitis: Secondary | ICD-10-CM | POA: Diagnosis not present

## 2019-03-19 DIAGNOSIS — R112 Nausea with vomiting, unspecified: Secondary | ICD-10-CM | POA: Diagnosis not present

## 2019-03-20 DIAGNOSIS — R0602 Shortness of breath: Secondary | ICD-10-CM | POA: Diagnosis not present

## 2019-03-20 DIAGNOSIS — R531 Weakness: Secondary | ICD-10-CM | POA: Diagnosis not present

## 2019-03-20 DIAGNOSIS — R52 Pain, unspecified: Secondary | ICD-10-CM | POA: Diagnosis not present

## 2019-03-20 DIAGNOSIS — J449 Chronic obstructive pulmonary disease, unspecified: Secondary | ICD-10-CM | POA: Diagnosis not present

## 2019-03-20 DIAGNOSIS — R112 Nausea with vomiting, unspecified: Secondary | ICD-10-CM | POA: Diagnosis not present

## 2019-03-20 DIAGNOSIS — F419 Anxiety disorder, unspecified: Secondary | ICD-10-CM | POA: Diagnosis not present

## 2019-03-20 DIAGNOSIS — K219 Gastro-esophageal reflux disease without esophagitis: Secondary | ICD-10-CM | POA: Diagnosis not present

## 2019-03-20 DIAGNOSIS — C3490 Malignant neoplasm of unspecified part of unspecified bronchus or lung: Secondary | ICD-10-CM | POA: Diagnosis not present

## 2019-03-21 DIAGNOSIS — R0602 Shortness of breath: Secondary | ICD-10-CM | POA: Diagnosis not present

## 2019-03-21 DIAGNOSIS — F419 Anxiety disorder, unspecified: Secondary | ICD-10-CM | POA: Diagnosis not present

## 2019-03-21 DIAGNOSIS — R531 Weakness: Secondary | ICD-10-CM | POA: Diagnosis not present

## 2019-03-21 DIAGNOSIS — C3490 Malignant neoplasm of unspecified part of unspecified bronchus or lung: Secondary | ICD-10-CM | POA: Diagnosis not present

## 2019-03-21 DIAGNOSIS — J449 Chronic obstructive pulmonary disease, unspecified: Secondary | ICD-10-CM | POA: Diagnosis not present

## 2019-03-21 DIAGNOSIS — R52 Pain, unspecified: Secondary | ICD-10-CM | POA: Diagnosis not present

## 2019-03-21 DIAGNOSIS — R112 Nausea with vomiting, unspecified: Secondary | ICD-10-CM | POA: Diagnosis not present

## 2019-03-21 DIAGNOSIS — K219 Gastro-esophageal reflux disease without esophagitis: Secondary | ICD-10-CM | POA: Diagnosis not present

## 2019-03-22 DIAGNOSIS — C3490 Malignant neoplasm of unspecified part of unspecified bronchus or lung: Secondary | ICD-10-CM | POA: Diagnosis not present

## 2019-03-22 DIAGNOSIS — J449 Chronic obstructive pulmonary disease, unspecified: Secondary | ICD-10-CM | POA: Diagnosis not present

## 2019-03-22 DIAGNOSIS — R531 Weakness: Secondary | ICD-10-CM | POA: Diagnosis not present

## 2019-03-22 DIAGNOSIS — R52 Pain, unspecified: Secondary | ICD-10-CM | POA: Diagnosis not present

## 2019-03-22 DIAGNOSIS — F419 Anxiety disorder, unspecified: Secondary | ICD-10-CM | POA: Diagnosis not present

## 2019-03-22 DIAGNOSIS — R0602 Shortness of breath: Secondary | ICD-10-CM | POA: Diagnosis not present

## 2019-03-22 DIAGNOSIS — K219 Gastro-esophageal reflux disease without esophagitis: Secondary | ICD-10-CM | POA: Diagnosis not present

## 2019-03-22 DIAGNOSIS — R112 Nausea with vomiting, unspecified: Secondary | ICD-10-CM | POA: Diagnosis not present

## 2019-03-23 DIAGNOSIS — F419 Anxiety disorder, unspecified: Secondary | ICD-10-CM | POA: Diagnosis not present

## 2019-03-23 DIAGNOSIS — R112 Nausea with vomiting, unspecified: Secondary | ICD-10-CM | POA: Diagnosis not present

## 2019-03-23 DIAGNOSIS — R52 Pain, unspecified: Secondary | ICD-10-CM | POA: Diagnosis not present

## 2019-03-23 DIAGNOSIS — J449 Chronic obstructive pulmonary disease, unspecified: Secondary | ICD-10-CM | POA: Diagnosis not present

## 2019-03-23 DIAGNOSIS — R0602 Shortness of breath: Secondary | ICD-10-CM | POA: Diagnosis not present

## 2019-03-23 DIAGNOSIS — K219 Gastro-esophageal reflux disease without esophagitis: Secondary | ICD-10-CM | POA: Diagnosis not present

## 2019-03-23 DIAGNOSIS — R531 Weakness: Secondary | ICD-10-CM | POA: Diagnosis not present

## 2019-03-23 DIAGNOSIS — C3490 Malignant neoplasm of unspecified part of unspecified bronchus or lung: Secondary | ICD-10-CM | POA: Diagnosis not present

## 2019-03-24 DIAGNOSIS — K219 Gastro-esophageal reflux disease without esophagitis: Secondary | ICD-10-CM | POA: Diagnosis not present

## 2019-03-24 DIAGNOSIS — R531 Weakness: Secondary | ICD-10-CM | POA: Diagnosis not present

## 2019-03-24 DIAGNOSIS — J449 Chronic obstructive pulmonary disease, unspecified: Secondary | ICD-10-CM | POA: Diagnosis not present

## 2019-03-24 DIAGNOSIS — R52 Pain, unspecified: Secondary | ICD-10-CM | POA: Diagnosis not present

## 2019-03-24 DIAGNOSIS — F419 Anxiety disorder, unspecified: Secondary | ICD-10-CM | POA: Diagnosis not present

## 2019-03-24 DIAGNOSIS — R112 Nausea with vomiting, unspecified: Secondary | ICD-10-CM | POA: Diagnosis not present

## 2019-03-24 DIAGNOSIS — C3490 Malignant neoplasm of unspecified part of unspecified bronchus or lung: Secondary | ICD-10-CM | POA: Diagnosis not present

## 2019-03-24 DIAGNOSIS — R0602 Shortness of breath: Secondary | ICD-10-CM | POA: Diagnosis not present

## 2019-03-25 DIAGNOSIS — R112 Nausea with vomiting, unspecified: Secondary | ICD-10-CM | POA: Diagnosis not present

## 2019-03-25 DIAGNOSIS — J449 Chronic obstructive pulmonary disease, unspecified: Secondary | ICD-10-CM | POA: Diagnosis not present

## 2019-03-25 DIAGNOSIS — R531 Weakness: Secondary | ICD-10-CM | POA: Diagnosis not present

## 2019-03-25 DIAGNOSIS — C3490 Malignant neoplasm of unspecified part of unspecified bronchus or lung: Secondary | ICD-10-CM | POA: Diagnosis not present

## 2019-03-25 DIAGNOSIS — R52 Pain, unspecified: Secondary | ICD-10-CM | POA: Diagnosis not present

## 2019-03-25 DIAGNOSIS — F419 Anxiety disorder, unspecified: Secondary | ICD-10-CM | POA: Diagnosis not present

## 2019-03-25 DIAGNOSIS — R0602 Shortness of breath: Secondary | ICD-10-CM | POA: Diagnosis not present

## 2019-03-25 DIAGNOSIS — K219 Gastro-esophageal reflux disease without esophagitis: Secondary | ICD-10-CM | POA: Diagnosis not present

## 2019-03-26 DIAGNOSIS — K219 Gastro-esophageal reflux disease without esophagitis: Secondary | ICD-10-CM | POA: Diagnosis not present

## 2019-03-26 DIAGNOSIS — R0602 Shortness of breath: Secondary | ICD-10-CM | POA: Diagnosis not present

## 2019-03-26 DIAGNOSIS — R531 Weakness: Secondary | ICD-10-CM | POA: Diagnosis not present

## 2019-03-26 DIAGNOSIS — C3490 Malignant neoplasm of unspecified part of unspecified bronchus or lung: Secondary | ICD-10-CM | POA: Diagnosis not present

## 2019-03-26 DIAGNOSIS — J449 Chronic obstructive pulmonary disease, unspecified: Secondary | ICD-10-CM | POA: Diagnosis not present

## 2019-03-26 DIAGNOSIS — F419 Anxiety disorder, unspecified: Secondary | ICD-10-CM | POA: Diagnosis not present

## 2019-03-26 DIAGNOSIS — R112 Nausea with vomiting, unspecified: Secondary | ICD-10-CM | POA: Diagnosis not present

## 2019-03-26 DIAGNOSIS — R52 Pain, unspecified: Secondary | ICD-10-CM | POA: Diagnosis not present

## 2019-03-27 DIAGNOSIS — R531 Weakness: Secondary | ICD-10-CM | POA: Diagnosis not present

## 2019-03-27 DIAGNOSIS — F419 Anxiety disorder, unspecified: Secondary | ICD-10-CM | POA: Diagnosis not present

## 2019-03-27 DIAGNOSIS — R112 Nausea with vomiting, unspecified: Secondary | ICD-10-CM | POA: Diagnosis not present

## 2019-03-27 DIAGNOSIS — K219 Gastro-esophageal reflux disease without esophagitis: Secondary | ICD-10-CM | POA: Diagnosis not present

## 2019-03-27 DIAGNOSIS — J449 Chronic obstructive pulmonary disease, unspecified: Secondary | ICD-10-CM | POA: Diagnosis not present

## 2019-03-27 DIAGNOSIS — R0602 Shortness of breath: Secondary | ICD-10-CM | POA: Diagnosis not present

## 2019-03-27 DIAGNOSIS — R52 Pain, unspecified: Secondary | ICD-10-CM | POA: Diagnosis not present

## 2019-03-27 DIAGNOSIS — C3490 Malignant neoplasm of unspecified part of unspecified bronchus or lung: Secondary | ICD-10-CM | POA: Diagnosis not present

## 2019-03-28 DIAGNOSIS — C3490 Malignant neoplasm of unspecified part of unspecified bronchus or lung: Secondary | ICD-10-CM | POA: Diagnosis not present

## 2019-03-28 DIAGNOSIS — F419 Anxiety disorder, unspecified: Secondary | ICD-10-CM | POA: Diagnosis not present

## 2019-03-28 DIAGNOSIS — R531 Weakness: Secondary | ICD-10-CM | POA: Diagnosis not present

## 2019-03-28 DIAGNOSIS — J449 Chronic obstructive pulmonary disease, unspecified: Secondary | ICD-10-CM | POA: Diagnosis not present

## 2019-03-28 DIAGNOSIS — R112 Nausea with vomiting, unspecified: Secondary | ICD-10-CM | POA: Diagnosis not present

## 2019-03-28 DIAGNOSIS — K219 Gastro-esophageal reflux disease without esophagitis: Secondary | ICD-10-CM | POA: Diagnosis not present

## 2019-03-28 DIAGNOSIS — R52 Pain, unspecified: Secondary | ICD-10-CM | POA: Diagnosis not present

## 2019-03-28 DIAGNOSIS — R0602 Shortness of breath: Secondary | ICD-10-CM | POA: Diagnosis not present

## 2019-03-29 DIAGNOSIS — R52 Pain, unspecified: Secondary | ICD-10-CM | POA: Diagnosis not present

## 2019-03-29 DIAGNOSIS — R531 Weakness: Secondary | ICD-10-CM | POA: Diagnosis not present

## 2019-03-29 DIAGNOSIS — C3490 Malignant neoplasm of unspecified part of unspecified bronchus or lung: Secondary | ICD-10-CM | POA: Diagnosis not present

## 2019-03-29 DIAGNOSIS — R112 Nausea with vomiting, unspecified: Secondary | ICD-10-CM | POA: Diagnosis not present

## 2019-03-29 DIAGNOSIS — F419 Anxiety disorder, unspecified: Secondary | ICD-10-CM | POA: Diagnosis not present

## 2019-03-29 DIAGNOSIS — K219 Gastro-esophageal reflux disease without esophagitis: Secondary | ICD-10-CM | POA: Diagnosis not present

## 2019-03-29 DIAGNOSIS — R0602 Shortness of breath: Secondary | ICD-10-CM | POA: Diagnosis not present

## 2019-03-29 DIAGNOSIS — J449 Chronic obstructive pulmonary disease, unspecified: Secondary | ICD-10-CM | POA: Diagnosis not present

## 2019-03-30 DIAGNOSIS — R531 Weakness: Secondary | ICD-10-CM | POA: Diagnosis not present

## 2019-03-30 DIAGNOSIS — C3490 Malignant neoplasm of unspecified part of unspecified bronchus or lung: Secondary | ICD-10-CM | POA: Diagnosis not present

## 2019-03-30 DIAGNOSIS — R0602 Shortness of breath: Secondary | ICD-10-CM | POA: Diagnosis not present

## 2019-03-30 DIAGNOSIS — J449 Chronic obstructive pulmonary disease, unspecified: Secondary | ICD-10-CM | POA: Diagnosis not present

## 2019-03-30 DIAGNOSIS — R112 Nausea with vomiting, unspecified: Secondary | ICD-10-CM | POA: Diagnosis not present

## 2019-03-30 DIAGNOSIS — F419 Anxiety disorder, unspecified: Secondary | ICD-10-CM | POA: Diagnosis not present

## 2019-03-30 DIAGNOSIS — K219 Gastro-esophageal reflux disease without esophagitis: Secondary | ICD-10-CM | POA: Diagnosis not present

## 2019-03-30 DIAGNOSIS — R52 Pain, unspecified: Secondary | ICD-10-CM | POA: Diagnosis not present

## 2019-03-31 DIAGNOSIS — R531 Weakness: Secondary | ICD-10-CM | POA: Diagnosis not present

## 2019-03-31 DIAGNOSIS — C3490 Malignant neoplasm of unspecified part of unspecified bronchus or lung: Secondary | ICD-10-CM | POA: Diagnosis not present

## 2019-03-31 DIAGNOSIS — J449 Chronic obstructive pulmonary disease, unspecified: Secondary | ICD-10-CM | POA: Diagnosis not present

## 2019-03-31 DIAGNOSIS — K219 Gastro-esophageal reflux disease without esophagitis: Secondary | ICD-10-CM | POA: Diagnosis not present

## 2019-03-31 DIAGNOSIS — F419 Anxiety disorder, unspecified: Secondary | ICD-10-CM | POA: Diagnosis not present

## 2019-03-31 DIAGNOSIS — R52 Pain, unspecified: Secondary | ICD-10-CM | POA: Diagnosis not present

## 2019-03-31 DIAGNOSIS — R112 Nausea with vomiting, unspecified: Secondary | ICD-10-CM | POA: Diagnosis not present

## 2019-03-31 DIAGNOSIS — R0602 Shortness of breath: Secondary | ICD-10-CM | POA: Diagnosis not present

## 2019-04-01 DIAGNOSIS — K219 Gastro-esophageal reflux disease without esophagitis: Secondary | ICD-10-CM | POA: Diagnosis not present

## 2019-04-01 DIAGNOSIS — J449 Chronic obstructive pulmonary disease, unspecified: Secondary | ICD-10-CM | POA: Diagnosis not present

## 2019-04-01 DIAGNOSIS — R112 Nausea with vomiting, unspecified: Secondary | ICD-10-CM | POA: Diagnosis not present

## 2019-04-01 DIAGNOSIS — R0602 Shortness of breath: Secondary | ICD-10-CM | POA: Diagnosis not present

## 2019-04-01 DIAGNOSIS — F419 Anxiety disorder, unspecified: Secondary | ICD-10-CM | POA: Diagnosis not present

## 2019-04-01 DIAGNOSIS — R531 Weakness: Secondary | ICD-10-CM | POA: Diagnosis not present

## 2019-04-01 DIAGNOSIS — C3490 Malignant neoplasm of unspecified part of unspecified bronchus or lung: Secondary | ICD-10-CM | POA: Diagnosis not present

## 2019-04-01 DIAGNOSIS — R52 Pain, unspecified: Secondary | ICD-10-CM | POA: Diagnosis not present

## 2019-04-02 DIAGNOSIS — R52 Pain, unspecified: Secondary | ICD-10-CM | POA: Diagnosis not present

## 2019-04-02 DIAGNOSIS — C3490 Malignant neoplasm of unspecified part of unspecified bronchus or lung: Secondary | ICD-10-CM | POA: Diagnosis not present

## 2019-04-02 DIAGNOSIS — K219 Gastro-esophageal reflux disease without esophagitis: Secondary | ICD-10-CM | POA: Diagnosis not present

## 2019-04-02 DIAGNOSIS — F419 Anxiety disorder, unspecified: Secondary | ICD-10-CM | POA: Diagnosis not present

## 2019-04-02 DIAGNOSIS — J449 Chronic obstructive pulmonary disease, unspecified: Secondary | ICD-10-CM | POA: Diagnosis not present

## 2019-04-02 DIAGNOSIS — R0602 Shortness of breath: Secondary | ICD-10-CM | POA: Diagnosis not present

## 2019-04-02 DIAGNOSIS — R112 Nausea with vomiting, unspecified: Secondary | ICD-10-CM | POA: Diagnosis not present

## 2019-04-02 DIAGNOSIS — R531 Weakness: Secondary | ICD-10-CM | POA: Diagnosis not present

## 2019-04-03 DIAGNOSIS — R112 Nausea with vomiting, unspecified: Secondary | ICD-10-CM | POA: Diagnosis not present

## 2019-04-03 DIAGNOSIS — R531 Weakness: Secondary | ICD-10-CM | POA: Diagnosis not present

## 2019-04-03 DIAGNOSIS — K219 Gastro-esophageal reflux disease without esophagitis: Secondary | ICD-10-CM | POA: Diagnosis not present

## 2019-04-03 DIAGNOSIS — R0602 Shortness of breath: Secondary | ICD-10-CM | POA: Diagnosis not present

## 2019-04-03 DIAGNOSIS — R52 Pain, unspecified: Secondary | ICD-10-CM | POA: Diagnosis not present

## 2019-04-03 DIAGNOSIS — F419 Anxiety disorder, unspecified: Secondary | ICD-10-CM | POA: Diagnosis not present

## 2019-04-03 DIAGNOSIS — C3490 Malignant neoplasm of unspecified part of unspecified bronchus or lung: Secondary | ICD-10-CM | POA: Diagnosis not present

## 2019-04-03 DIAGNOSIS — J449 Chronic obstructive pulmonary disease, unspecified: Secondary | ICD-10-CM | POA: Diagnosis not present

## 2019-04-04 DIAGNOSIS — R52 Pain, unspecified: Secondary | ICD-10-CM | POA: Diagnosis not present

## 2019-04-04 DIAGNOSIS — K219 Gastro-esophageal reflux disease without esophagitis: Secondary | ICD-10-CM | POA: Diagnosis not present

## 2019-04-04 DIAGNOSIS — R112 Nausea with vomiting, unspecified: Secondary | ICD-10-CM | POA: Diagnosis not present

## 2019-04-04 DIAGNOSIS — R0602 Shortness of breath: Secondary | ICD-10-CM | POA: Diagnosis not present

## 2019-04-04 DIAGNOSIS — R531 Weakness: Secondary | ICD-10-CM | POA: Diagnosis not present

## 2019-04-04 DIAGNOSIS — J449 Chronic obstructive pulmonary disease, unspecified: Secondary | ICD-10-CM | POA: Diagnosis not present

## 2019-04-04 DIAGNOSIS — F419 Anxiety disorder, unspecified: Secondary | ICD-10-CM | POA: Diagnosis not present

## 2019-04-04 DIAGNOSIS — C3490 Malignant neoplasm of unspecified part of unspecified bronchus or lung: Secondary | ICD-10-CM | POA: Diagnosis not present

## 2019-04-05 DIAGNOSIS — R52 Pain, unspecified: Secondary | ICD-10-CM | POA: Diagnosis not present

## 2019-04-05 DIAGNOSIS — J449 Chronic obstructive pulmonary disease, unspecified: Secondary | ICD-10-CM | POA: Diagnosis not present

## 2019-04-05 DIAGNOSIS — R0602 Shortness of breath: Secondary | ICD-10-CM | POA: Diagnosis not present

## 2019-04-05 DIAGNOSIS — F419 Anxiety disorder, unspecified: Secondary | ICD-10-CM | POA: Diagnosis not present

## 2019-04-05 DIAGNOSIS — R112 Nausea with vomiting, unspecified: Secondary | ICD-10-CM | POA: Diagnosis not present

## 2019-04-05 DIAGNOSIS — C3490 Malignant neoplasm of unspecified part of unspecified bronchus or lung: Secondary | ICD-10-CM | POA: Diagnosis not present

## 2019-04-05 DIAGNOSIS — R531 Weakness: Secondary | ICD-10-CM | POA: Diagnosis not present

## 2019-04-05 DIAGNOSIS — K219 Gastro-esophageal reflux disease without esophagitis: Secondary | ICD-10-CM | POA: Diagnosis not present

## 2019-04-06 DIAGNOSIS — K219 Gastro-esophageal reflux disease without esophagitis: Secondary | ICD-10-CM | POA: Diagnosis not present

## 2019-04-06 DIAGNOSIS — F419 Anxiety disorder, unspecified: Secondary | ICD-10-CM | POA: Diagnosis not present

## 2019-04-06 DIAGNOSIS — C3490 Malignant neoplasm of unspecified part of unspecified bronchus or lung: Secondary | ICD-10-CM | POA: Diagnosis not present

## 2019-04-06 DIAGNOSIS — R0602 Shortness of breath: Secondary | ICD-10-CM | POA: Diagnosis not present

## 2019-04-06 DIAGNOSIS — R531 Weakness: Secondary | ICD-10-CM | POA: Diagnosis not present

## 2019-04-06 DIAGNOSIS — R112 Nausea with vomiting, unspecified: Secondary | ICD-10-CM | POA: Diagnosis not present

## 2019-04-06 DIAGNOSIS — J449 Chronic obstructive pulmonary disease, unspecified: Secondary | ICD-10-CM | POA: Diagnosis not present

## 2019-04-06 DIAGNOSIS — R52 Pain, unspecified: Secondary | ICD-10-CM | POA: Diagnosis not present

## 2019-04-07 DIAGNOSIS — R52 Pain, unspecified: Secondary | ICD-10-CM | POA: Diagnosis not present

## 2019-04-07 DIAGNOSIS — J449 Chronic obstructive pulmonary disease, unspecified: Secondary | ICD-10-CM | POA: Diagnosis not present

## 2019-04-07 DIAGNOSIS — C3490 Malignant neoplasm of unspecified part of unspecified bronchus or lung: Secondary | ICD-10-CM | POA: Diagnosis not present

## 2019-04-07 DIAGNOSIS — K219 Gastro-esophageal reflux disease without esophagitis: Secondary | ICD-10-CM | POA: Diagnosis not present

## 2019-04-07 DIAGNOSIS — R531 Weakness: Secondary | ICD-10-CM | POA: Diagnosis not present

## 2019-04-07 DIAGNOSIS — F419 Anxiety disorder, unspecified: Secondary | ICD-10-CM | POA: Diagnosis not present

## 2019-04-07 DIAGNOSIS — R112 Nausea with vomiting, unspecified: Secondary | ICD-10-CM | POA: Diagnosis not present

## 2019-04-07 DIAGNOSIS — R0602 Shortness of breath: Secondary | ICD-10-CM | POA: Diagnosis not present

## 2019-04-08 DIAGNOSIS — K219 Gastro-esophageal reflux disease without esophagitis: Secondary | ICD-10-CM | POA: Diagnosis not present

## 2019-04-08 DIAGNOSIS — R531 Weakness: Secondary | ICD-10-CM | POA: Diagnosis not present

## 2019-04-08 DIAGNOSIS — C3490 Malignant neoplasm of unspecified part of unspecified bronchus or lung: Secondary | ICD-10-CM | POA: Diagnosis not present

## 2019-04-08 DIAGNOSIS — R0602 Shortness of breath: Secondary | ICD-10-CM | POA: Diagnosis not present

## 2019-04-08 DIAGNOSIS — R112 Nausea with vomiting, unspecified: Secondary | ICD-10-CM | POA: Diagnosis not present

## 2019-04-08 DIAGNOSIS — F419 Anxiety disorder, unspecified: Secondary | ICD-10-CM | POA: Diagnosis not present

## 2019-04-08 DIAGNOSIS — J449 Chronic obstructive pulmonary disease, unspecified: Secondary | ICD-10-CM | POA: Diagnosis not present

## 2019-04-08 DIAGNOSIS — R52 Pain, unspecified: Secondary | ICD-10-CM | POA: Diagnosis not present

## 2019-04-09 DIAGNOSIS — R112 Nausea with vomiting, unspecified: Secondary | ICD-10-CM | POA: Diagnosis not present

## 2019-04-09 DIAGNOSIS — R531 Weakness: Secondary | ICD-10-CM | POA: Diagnosis not present

## 2019-04-09 DIAGNOSIS — K219 Gastro-esophageal reflux disease without esophagitis: Secondary | ICD-10-CM | POA: Diagnosis not present

## 2019-04-09 DIAGNOSIS — R0602 Shortness of breath: Secondary | ICD-10-CM | POA: Diagnosis not present

## 2019-04-09 DIAGNOSIS — F419 Anxiety disorder, unspecified: Secondary | ICD-10-CM | POA: Diagnosis not present

## 2019-04-09 DIAGNOSIS — R52 Pain, unspecified: Secondary | ICD-10-CM | POA: Diagnosis not present

## 2019-04-09 DIAGNOSIS — J449 Chronic obstructive pulmonary disease, unspecified: Secondary | ICD-10-CM | POA: Diagnosis not present

## 2019-04-09 DIAGNOSIS — C3490 Malignant neoplasm of unspecified part of unspecified bronchus or lung: Secondary | ICD-10-CM | POA: Diagnosis not present

## 2019-04-10 DIAGNOSIS — C3490 Malignant neoplasm of unspecified part of unspecified bronchus or lung: Secondary | ICD-10-CM | POA: Diagnosis not present

## 2019-04-10 DIAGNOSIS — R0602 Shortness of breath: Secondary | ICD-10-CM | POA: Diagnosis not present

## 2019-04-10 DIAGNOSIS — R531 Weakness: Secondary | ICD-10-CM | POA: Diagnosis not present

## 2019-04-10 DIAGNOSIS — K219 Gastro-esophageal reflux disease without esophagitis: Secondary | ICD-10-CM | POA: Diagnosis not present

## 2019-04-10 DIAGNOSIS — M5136 Other intervertebral disc degeneration, lumbar region: Secondary | ICD-10-CM | POA: Diagnosis not present

## 2019-04-10 DIAGNOSIS — G894 Chronic pain syndrome: Secondary | ICD-10-CM | POA: Diagnosis not present

## 2019-04-10 DIAGNOSIS — R112 Nausea with vomiting, unspecified: Secondary | ICD-10-CM | POA: Diagnosis not present

## 2019-04-10 DIAGNOSIS — J449 Chronic obstructive pulmonary disease, unspecified: Secondary | ICD-10-CM | POA: Diagnosis not present

## 2019-04-10 DIAGNOSIS — M159 Polyosteoarthritis, unspecified: Secondary | ICD-10-CM | POA: Diagnosis not present

## 2019-04-10 DIAGNOSIS — R52 Pain, unspecified: Secondary | ICD-10-CM | POA: Diagnosis not present

## 2019-04-10 DIAGNOSIS — F419 Anxiety disorder, unspecified: Secondary | ICD-10-CM | POA: Diagnosis not present

## 2019-04-11 DIAGNOSIS — R112 Nausea with vomiting, unspecified: Secondary | ICD-10-CM | POA: Diagnosis not present

## 2019-04-11 DIAGNOSIS — F419 Anxiety disorder, unspecified: Secondary | ICD-10-CM | POA: Diagnosis not present

## 2019-04-11 DIAGNOSIS — R531 Weakness: Secondary | ICD-10-CM | POA: Diagnosis not present

## 2019-04-11 DIAGNOSIS — R0602 Shortness of breath: Secondary | ICD-10-CM | POA: Diagnosis not present

## 2019-04-11 DIAGNOSIS — J449 Chronic obstructive pulmonary disease, unspecified: Secondary | ICD-10-CM | POA: Diagnosis not present

## 2019-04-11 DIAGNOSIS — R52 Pain, unspecified: Secondary | ICD-10-CM | POA: Diagnosis not present

## 2019-04-11 DIAGNOSIS — C3490 Malignant neoplasm of unspecified part of unspecified bronchus or lung: Secondary | ICD-10-CM | POA: Diagnosis not present

## 2019-04-11 DIAGNOSIS — K219 Gastro-esophageal reflux disease without esophagitis: Secondary | ICD-10-CM | POA: Diagnosis not present

## 2019-04-12 ENCOUNTER — Telehealth: Payer: Self-pay

## 2019-04-12 ENCOUNTER — Other Ambulatory Visit: Payer: Self-pay | Admitting: Hematology

## 2019-04-12 DIAGNOSIS — K219 Gastro-esophageal reflux disease without esophagitis: Secondary | ICD-10-CM | POA: Diagnosis not present

## 2019-04-12 DIAGNOSIS — R52 Pain, unspecified: Secondary | ICD-10-CM | POA: Diagnosis not present

## 2019-04-12 DIAGNOSIS — F419 Anxiety disorder, unspecified: Secondary | ICD-10-CM | POA: Diagnosis not present

## 2019-04-12 DIAGNOSIS — R531 Weakness: Secondary | ICD-10-CM | POA: Diagnosis not present

## 2019-04-12 DIAGNOSIS — J449 Chronic obstructive pulmonary disease, unspecified: Secondary | ICD-10-CM | POA: Diagnosis not present

## 2019-04-12 DIAGNOSIS — R112 Nausea with vomiting, unspecified: Secondary | ICD-10-CM | POA: Diagnosis not present

## 2019-04-12 DIAGNOSIS — R0602 Shortness of breath: Secondary | ICD-10-CM | POA: Diagnosis not present

## 2019-04-12 DIAGNOSIS — C3490 Malignant neoplasm of unspecified part of unspecified bronchus or lung: Secondary | ICD-10-CM | POA: Diagnosis not present

## 2019-04-12 MED ORDER — MORPHINE SULFATE (CONCENTRATE) 20 MG/ML PO SOLN: 10.0000 mg | ORAL | 0 refills | Status: AC | PRN

## 2019-04-12 MED ORDER — MORPHINE SULFATE (CONCENTRATE) 20 MG/ML PO SOLN: 20.0000 mg | ORAL | 0 refills | Status: AC | PRN

## 2019-04-12 NOTE — Telephone Encounter (Signed)
Faxed signed orders to [facility], sent to HIM for scan to chart.

## 2019-04-13 DIAGNOSIS — R112 Nausea with vomiting, unspecified: Secondary | ICD-10-CM | POA: Diagnosis not present

## 2019-04-13 DIAGNOSIS — F419 Anxiety disorder, unspecified: Secondary | ICD-10-CM | POA: Diagnosis not present

## 2019-04-13 DIAGNOSIS — R52 Pain, unspecified: Secondary | ICD-10-CM | POA: Diagnosis not present

## 2019-04-13 DIAGNOSIS — K219 Gastro-esophageal reflux disease without esophagitis: Secondary | ICD-10-CM | POA: Diagnosis not present

## 2019-04-13 DIAGNOSIS — R0602 Shortness of breath: Secondary | ICD-10-CM | POA: Diagnosis not present

## 2019-04-13 DIAGNOSIS — R531 Weakness: Secondary | ICD-10-CM | POA: Diagnosis not present

## 2019-04-13 DIAGNOSIS — C3490 Malignant neoplasm of unspecified part of unspecified bronchus or lung: Secondary | ICD-10-CM | POA: Diagnosis not present

## 2019-04-13 DIAGNOSIS — J449 Chronic obstructive pulmonary disease, unspecified: Secondary | ICD-10-CM | POA: Diagnosis not present

## 2019-04-23 DEATH — deceased

## 2019-10-26 IMAGING — CT CT ANGIO CHEST
2 of 6 series · 17 of 46 positions shown · IV contrast (APPLIED)
Comparison: Chest CT 03/10/2018 and 01/10/2018.

CLINICAL DATA: Shortness of breath for 3 days. History of right
lung cancer. Chemotherapy ongoing.

EXAM:
CT ANGIOGRAPHY CHEST WITH CONTRAST
TECHNIQUE: Multidetector CT imaging of the chest was performed using the
standard protocol during bolus administration of intravenous
contrast. Multiplanar CT image reconstructions and MIPs were
obtained to evaluate the vascular anatomy.
CONTRAST:  75mL RAIUMG-ZNC IOPAMIDOL (RAIUMG-ZNC) INJECTION 76%

[Series 5: thins · axial · 0.65mm/px · z∈[-464,-176]mm · 15 of 315 slices shown]
[im 14/315  lung]
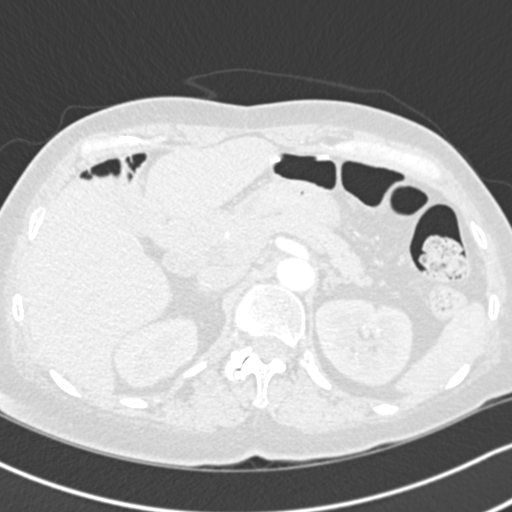
[im 41/315  soft-tissue]
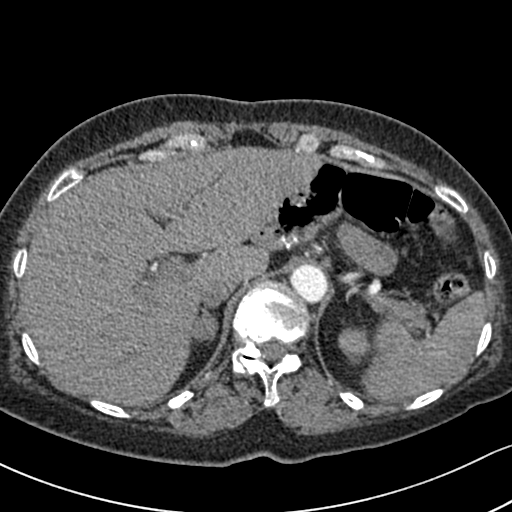
[im 55/315  lung]
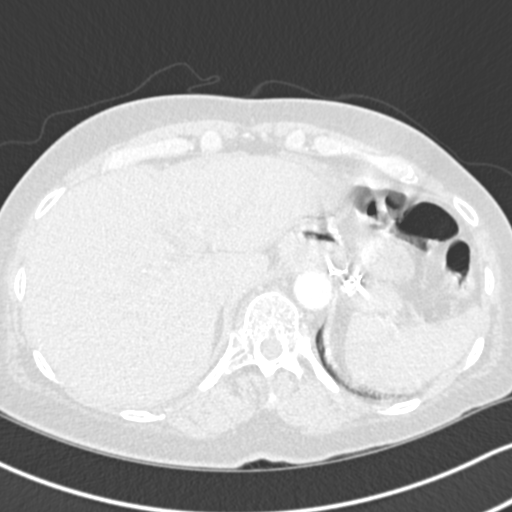
[im 82/315  soft-tissue]
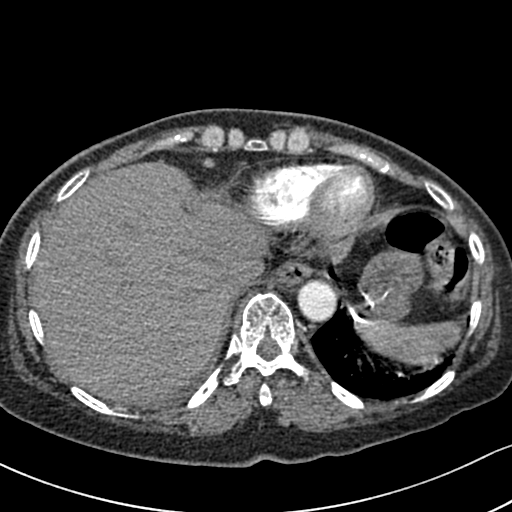
[im 96/315  lung]
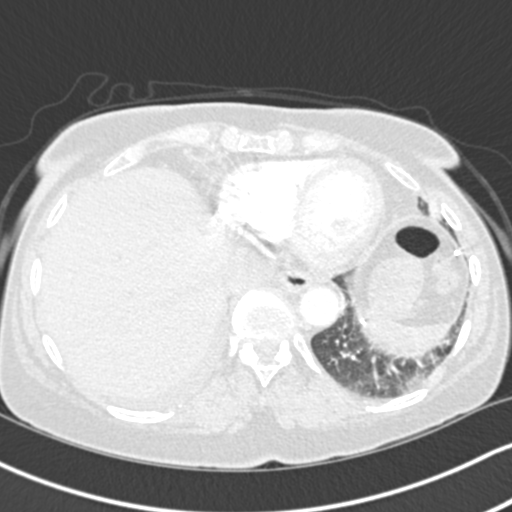
[im 123/315  soft-tissue]
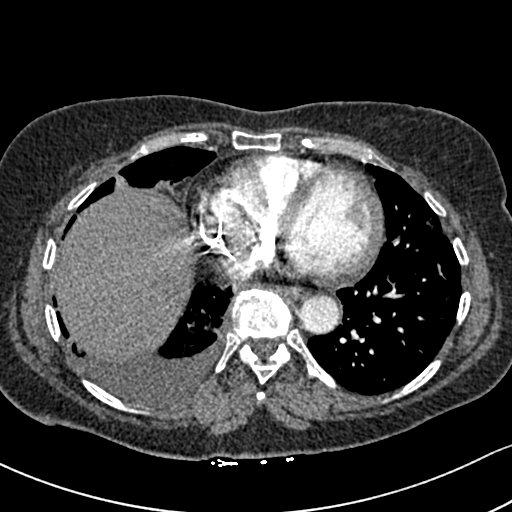
[im 137/315  lung]
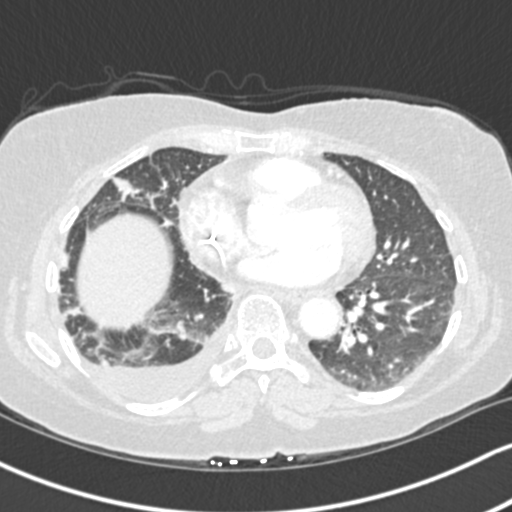
[im 164/315  soft-tissue]
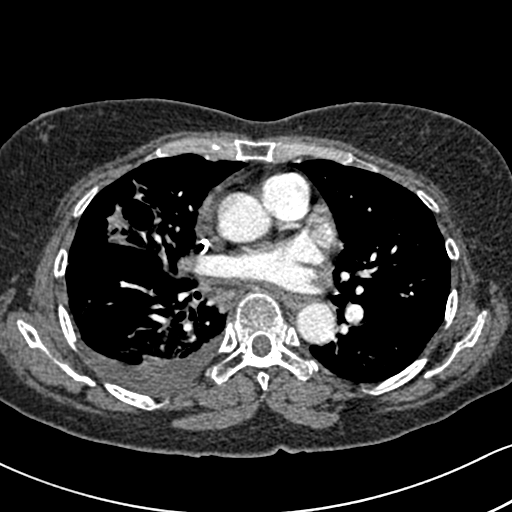
[im 178/315  lung]
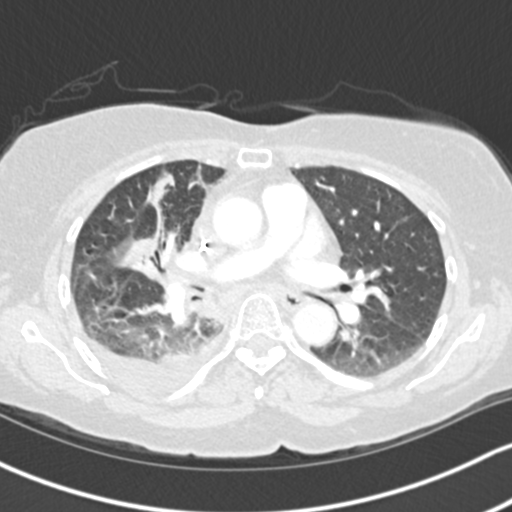
[im 192/315  soft-tissue]
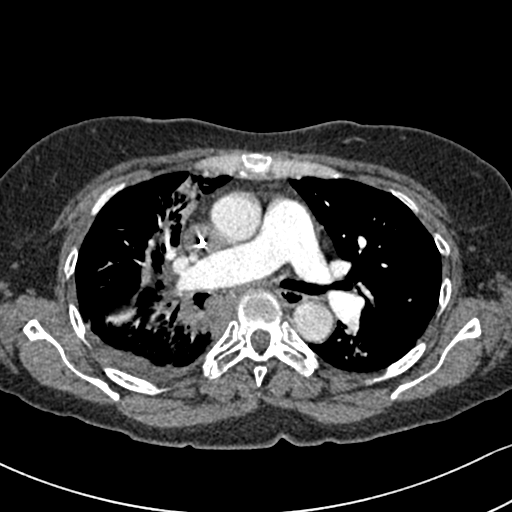
[im 219/315  lung]
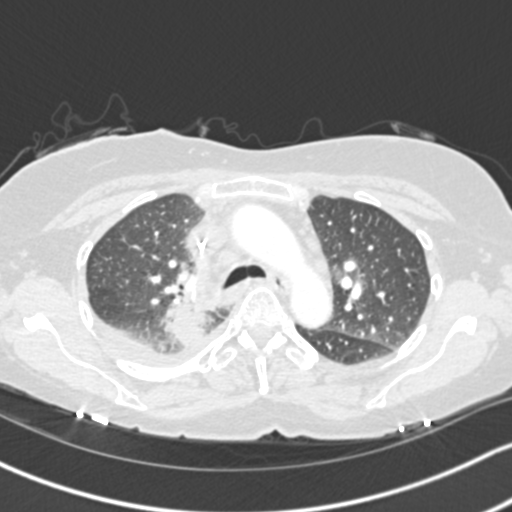
[im 233/315  soft-tissue]
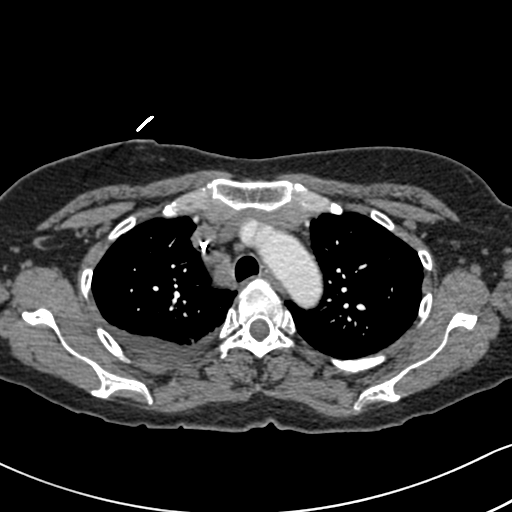
[im 260/315  lung]
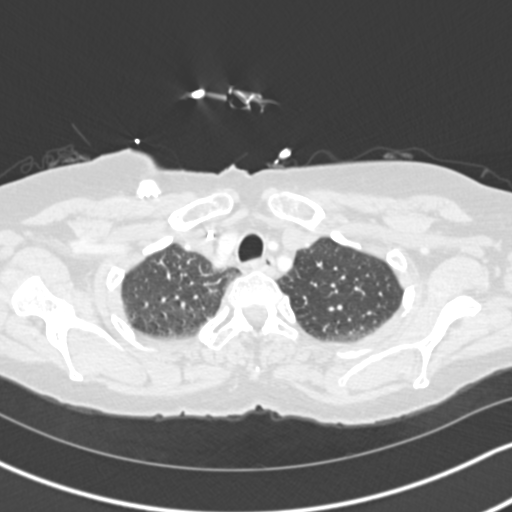
[im 274/315  soft-tissue]
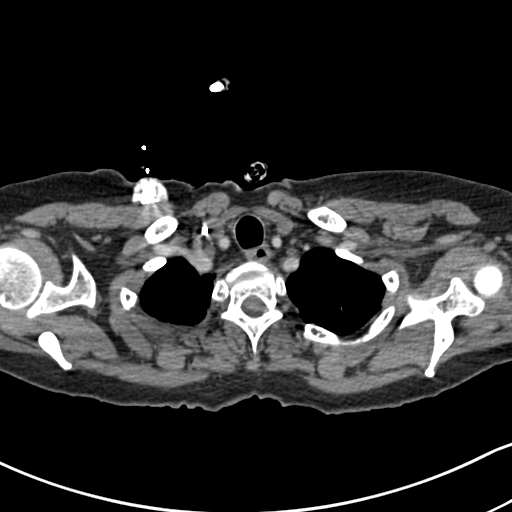
[im 301/315  lung]
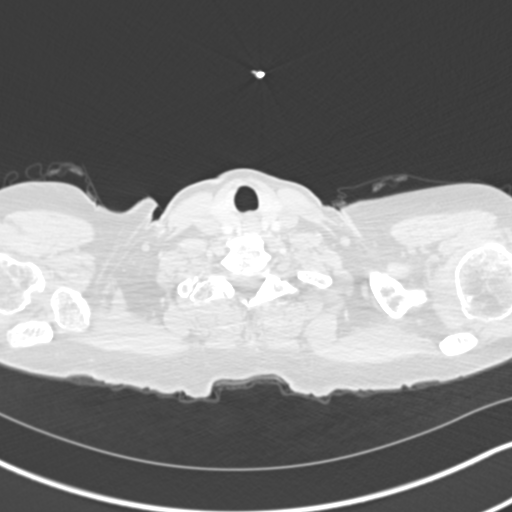

[Series 7: coronal mpr · coronal · 0.63mm/px · 2 of 84 slices shown]
[im 28/84  soft-tissue]
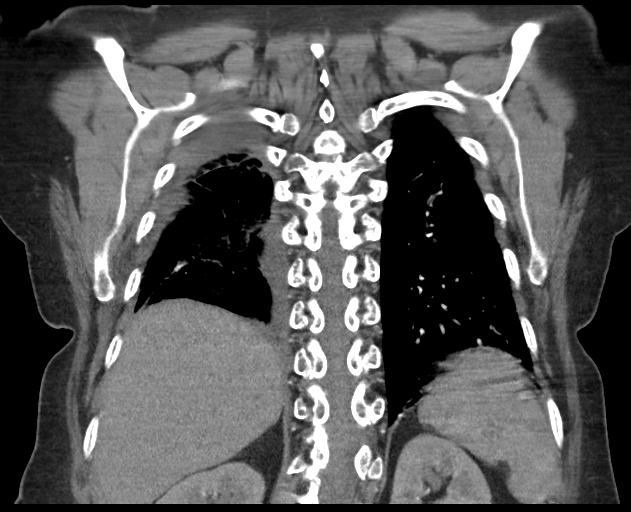
[im 56/84  soft-tissue]
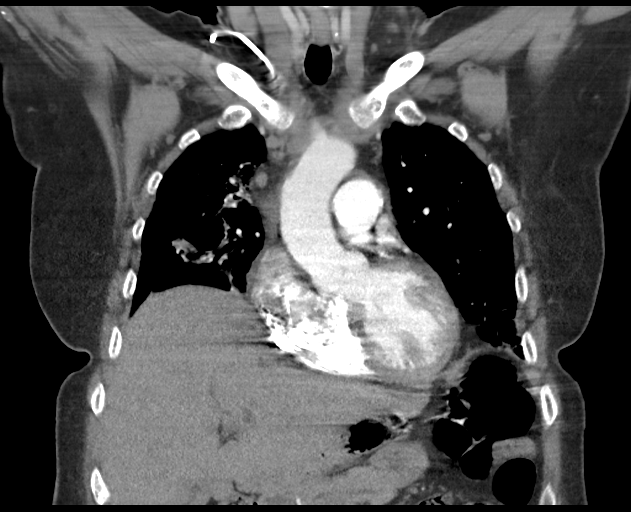

[17 of 46 positions shown; findings below may reference images not displayed]

FINDINGS: Cardiovascular: The pulmonary arteries are well opacified with
contrast to the level of the subsegmental branches. There is no
evidence of acute pulmonary embolism. Right IJ Port-A-Cath extends
into the mid right atrium. Minimal atherosclerosis of the aorta and
coronary arteries. The heart size is normal. There is no pericardial
effusion.

Mediastinum/Nodes: A right paratracheal node has mildly enlarged,
measuring 14 mm short axis on image [DATE] (previously 10 mm).
Additional small mediastinal and right hilar lymph nodes are stable.
There is no axillary adenopathy. The thyroid gland, trachea and
esophagus demonstrate no significant findings.

Lungs/Pleura: There is a new small dependent right pleural effusion
with associated mild dependent right lung atelectasis. There are
progressive radiation changes in the right perihilar region,
extending into the right middle lobe. Interval enlargement of the
cavitary retro hilar right lung mass, measuring 4.6 x 3.6 cm on
image 54/6. This cavitary mass communicates with the bronchus
intermedius. Patchy left basilar atelectasis has also mildly
increased. No other focal nodules identified.

Upper abdomen: Right adrenal nodule has enlarged, measuring 2.0 x
1.7 cm on image 94/4. This was not present on the original CT from
10/11/2017 and is suspicious for metastatic disease. The visualized
left adrenal gland and remainder of the upper abdomen appear stable.
There are postsurgical changes in the left subphrenic area.

Musculoskeletal/Chest wall: There is no chest wall mass or
suspicious osseous finding.

Review of the MIP images confirms the above findings.
IMPRESSION: 1. No evidence of acute pulmonary embolism.
2. Progressive radiation changes in the right perihilar region.
Cavitary retro hilar mass and right paratracheal lymph node have
enlarged, suspicious for local recurrence/disease progression.
3. Enlarging right adrenal nodule suspicious for metastatic disease.
4. New right pleural effusion with increased atelectasis at both
lung bases.
5. These results will be called to the ordering clinician or
representative by the [HOSPITAL] at the imaging location.

## 2020-08-01 IMAGING — DX DG CHEST 1V
1 series · 1 of 1 positions shown · non-contrast
Comparison: 01/08/2019

CLINICAL DATA: Post thoracentesis

EXAM:
CHEST  1 VIEW

[chest pa]
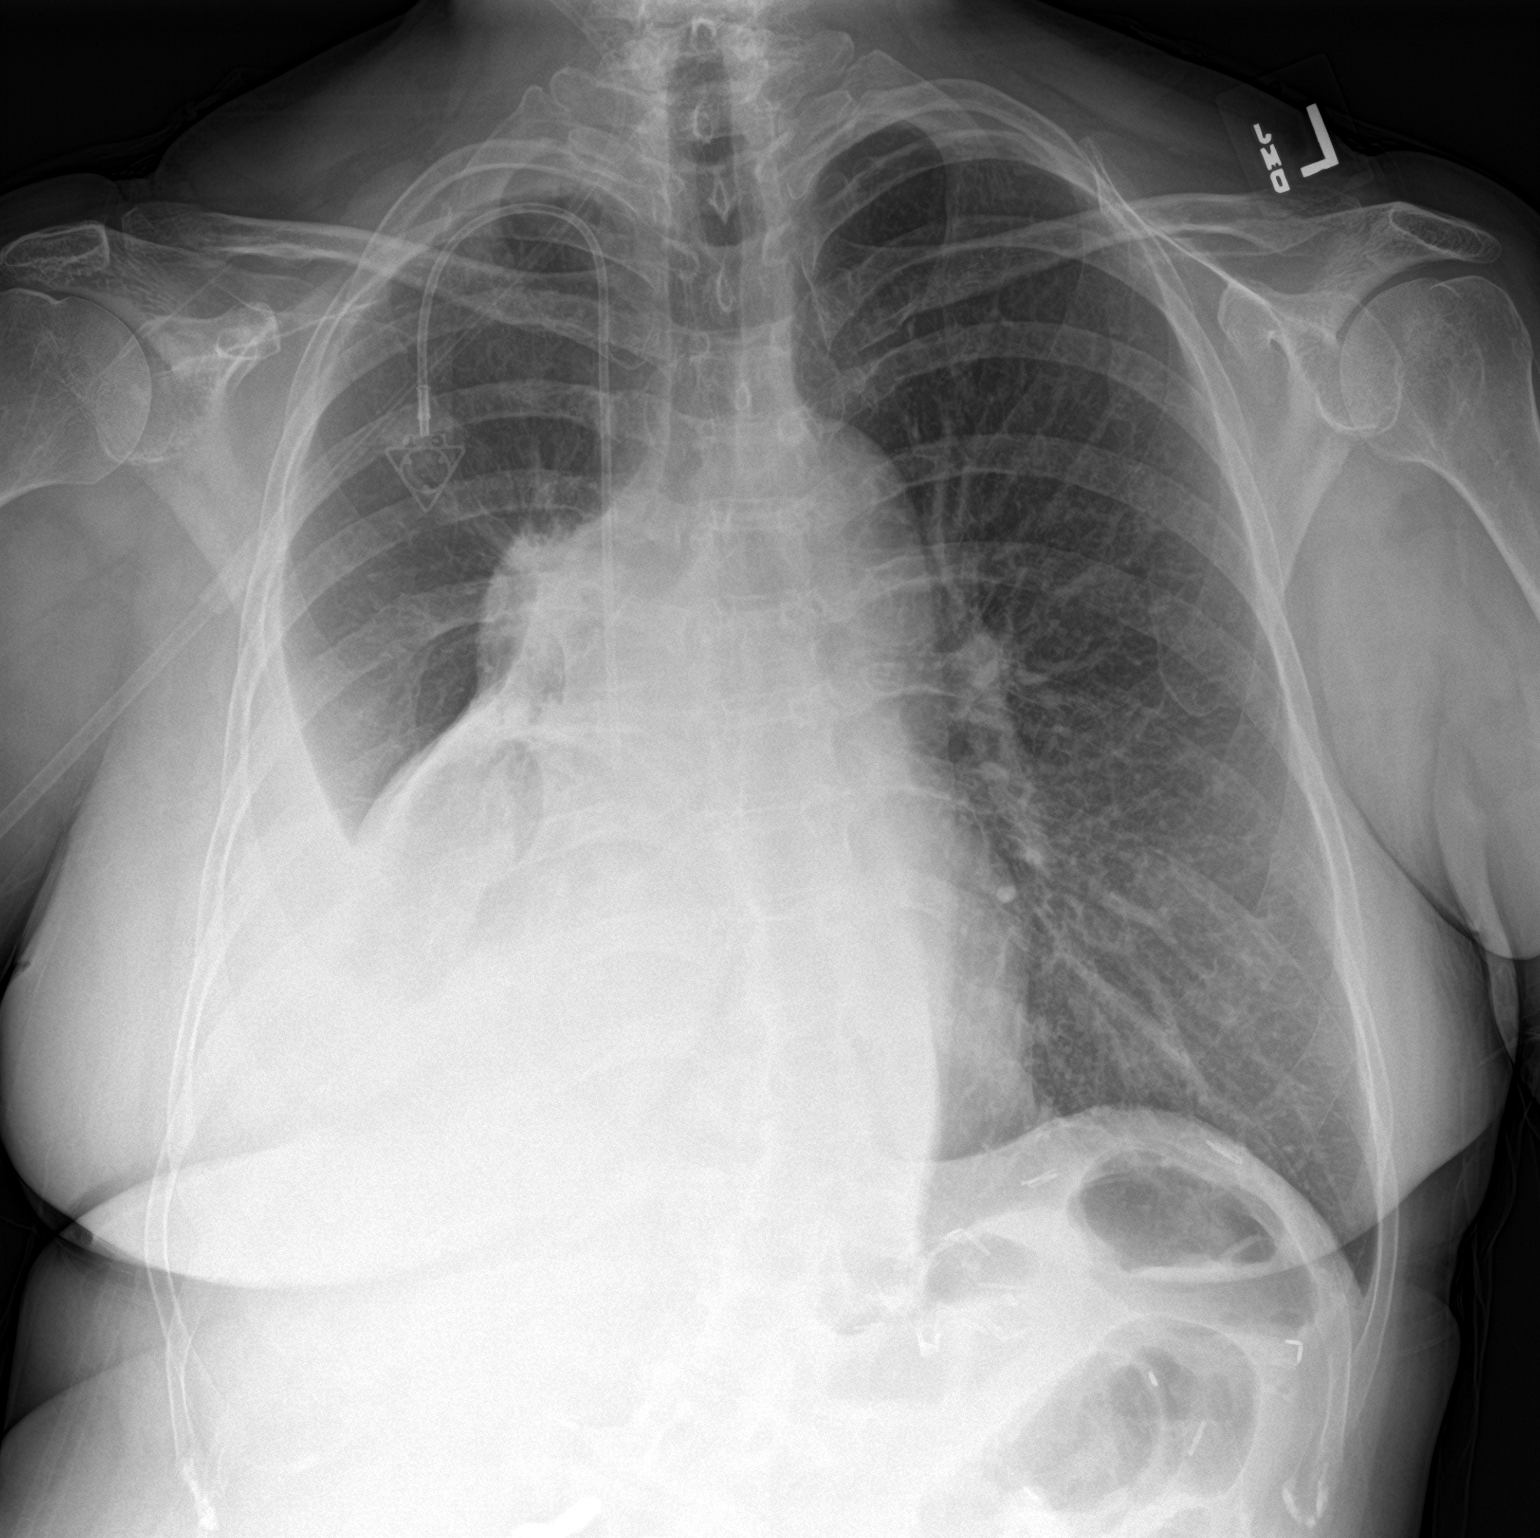

[1 of 1 positions shown; findings below may reference images not displayed]

FINDINGS: No pneumothorax following right thoracentesis. Moderate right
pleural effusion. Stable masslike opacity in the right
hilar/infrahilar region. Left lung clear.
IMPRESSION: No pneumothorax following thoracentesis. Moderate residual right
pleural effusion.

## 2020-08-01 IMAGING — US US THORACENTESIS ASP PLEURAL SPACE W/IMG GUIDE
1 series · 6 of 6 positions shown · non-contrast
Comparison: none

INDICATION: Patient with history of metastatic non-small cell lung cancer,
recurrent right pleural effusion. Request made for diagnostic and
therapeutic right thoracentesis.

[Series 1: us thoracentesis asp pleural space w/img guide · 6 of 6 slices shown]
[im 1/6]
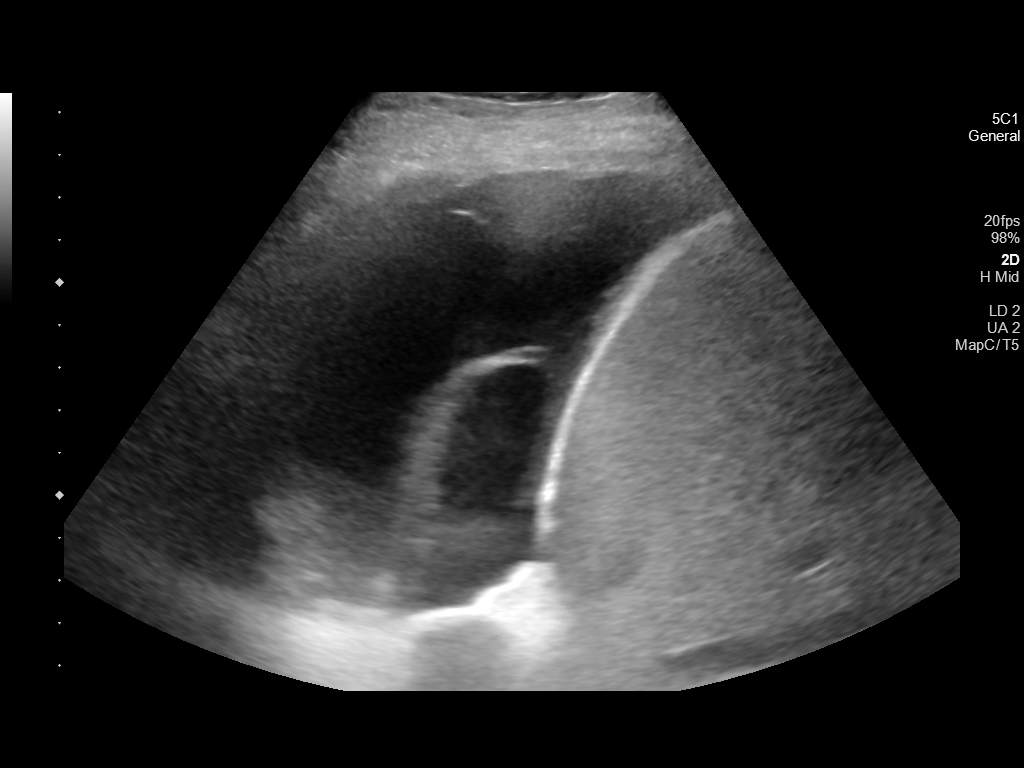
[im 2/6]
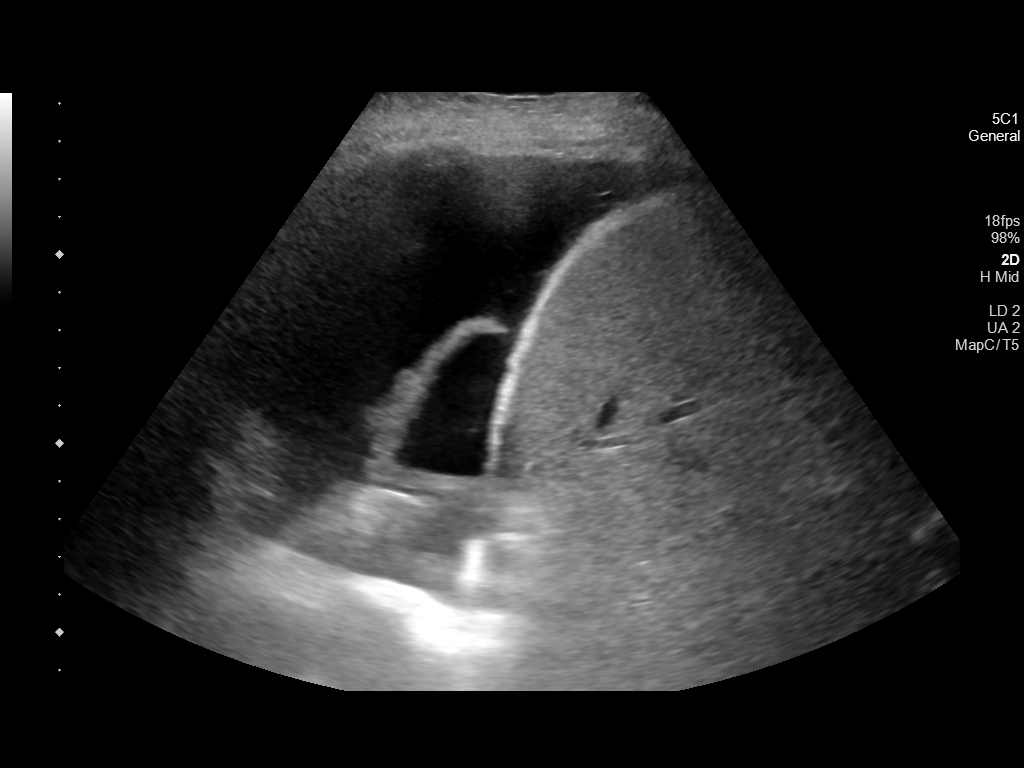
[im 3/6]
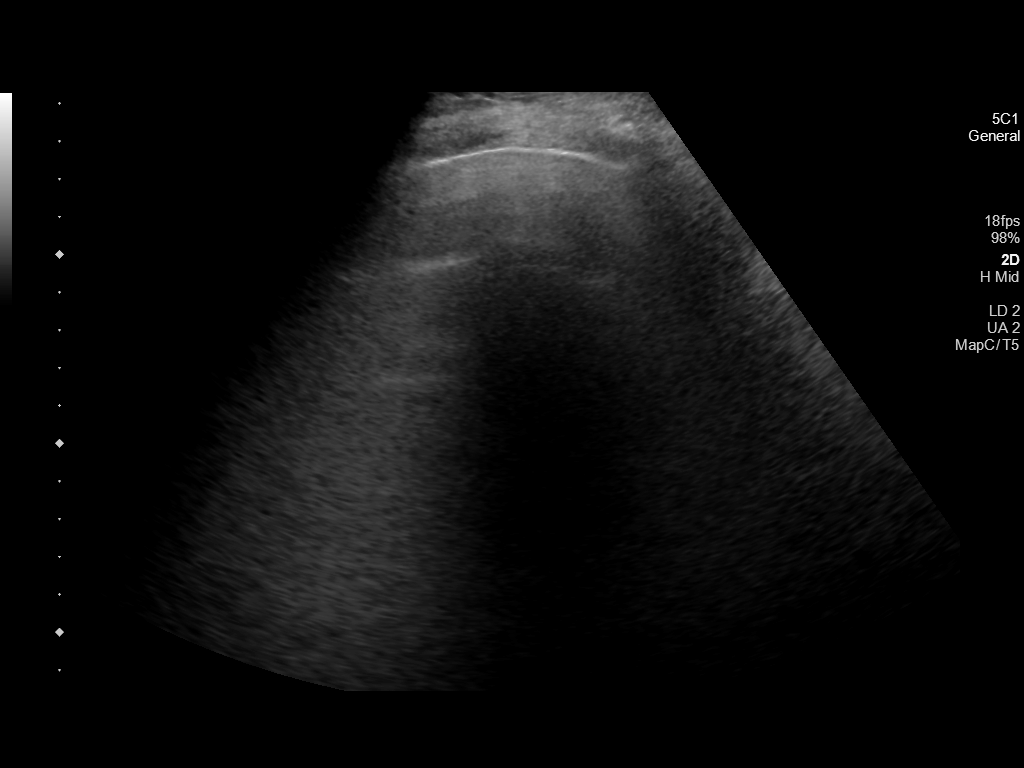
[im 4/6]
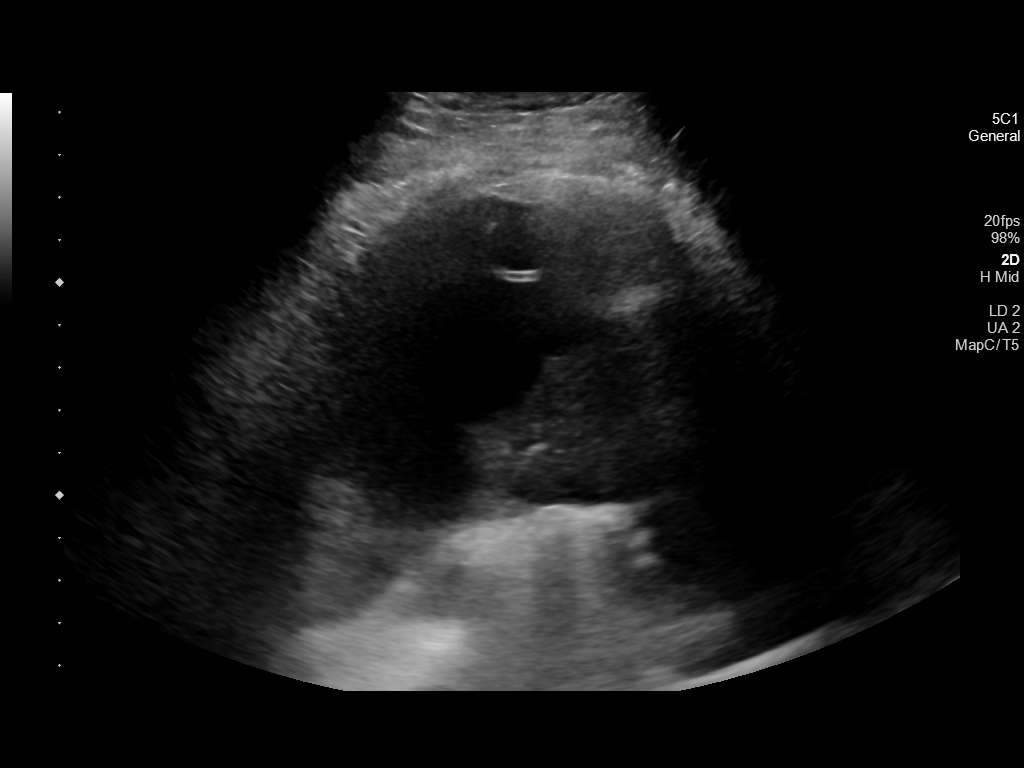
[im 5/6]
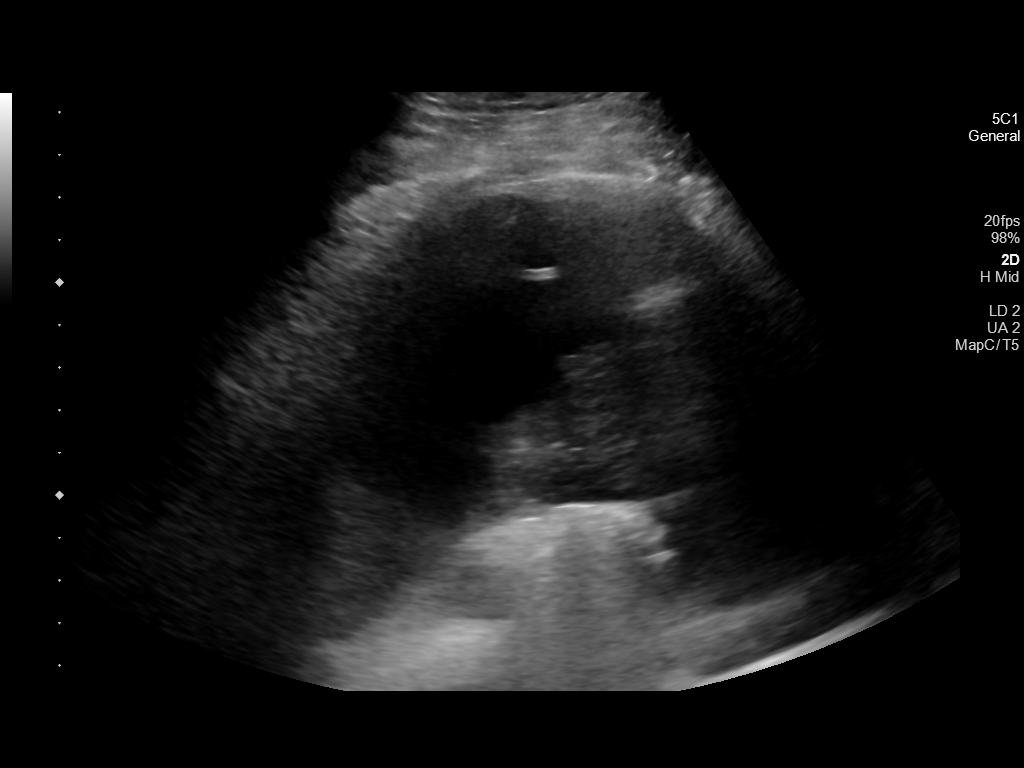
[im 6/6]
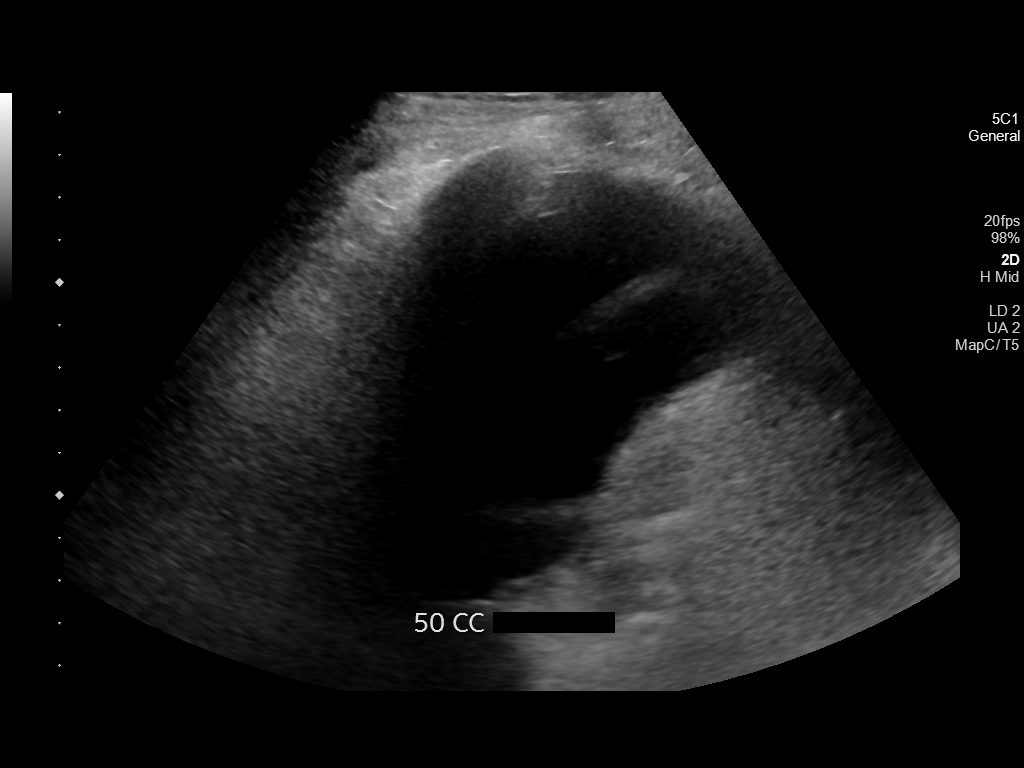

[6 of 6 positions shown; findings below may reference images not displayed]

EXAM:
ULTRASOUND GUIDED DIAGNOSTIC AND THERAPEUTIC RIGHT THORACENTESIS

MEDICATIONS:
None

COMPLICATIONS:
None immediate.

PROCEDURE:
An ultrasound guided thoracentesis was thoroughly discussed with the
patient and questions answered. The benefits, risks, alternatives
and complications were also discussed. The patient understands and
wishes to proceed with the procedure. Written consent was obtained.

Ultrasound was performed to localize and mark an adequate pocket of
fluid in the right chest. The area was then prepped and draped in
the normal sterile fashion. 1% Lidocaine was used for local
anesthesia. Under ultrasound guidance a 6 Fr Safe-T-Centesis
catheter was introduced. Thoracentesis was performed. The catheter
was removed and a dressing applied.
FINDINGS: A total of approximately 50 cc of light yellow fluid was removed.
Samples were sent to the laboratory as requested by the clinical
team. The pleural fluid also contained a few fragments of fibrinous
tissue which precluded removal of additional fluid at this time.
Despite catheter manipulation as well as flushing only the above
amount of fluid could be removed today.
IMPRESSION: Successful ultrasound guided diagnostic and therapeutic right
thoracentesis yielding 50 cc of pleural fluid. Few fragments of
fibrinous material noted in fluid which precluded removal of
additional fluid. If effusion persists consider [REDACTED] consult for
VATS.
